# Patient Record
Sex: Male | Born: 1965 | Race: White | Hispanic: No | Marital: Married | Smoking: Former smoker
Health system: Southern US, Community
[De-identification: ages and names within clinical notes are randomized; demographics above are authoritative.]

## PROBLEM LIST (undated history)

## (undated) DIAGNOSIS — I2510 Atherosclerotic heart disease of native coronary artery without angina pectoris: Secondary | ICD-10-CM

## (undated) DIAGNOSIS — C801 Malignant (primary) neoplasm, unspecified: Secondary | ICD-10-CM

## (undated) DIAGNOSIS — E785 Hyperlipidemia, unspecified: Secondary | ICD-10-CM

## (undated) DIAGNOSIS — J449 Chronic obstructive pulmonary disease, unspecified: Secondary | ICD-10-CM

## (undated) DIAGNOSIS — G4730 Sleep apnea, unspecified: Secondary | ICD-10-CM

## (undated) DIAGNOSIS — F17200 Nicotine dependence, unspecified, uncomplicated: Secondary | ICD-10-CM

## (undated) DIAGNOSIS — J189 Pneumonia, unspecified organism: Secondary | ICD-10-CM

## (undated) DIAGNOSIS — L309 Dermatitis, unspecified: Secondary | ICD-10-CM

## (undated) HISTORY — DX: Nicotine dependence, unspecified, uncomplicated: F17.200

## (undated) HISTORY — DX: Pneumonia, unspecified organism: J18.9

## (undated) HISTORY — PX: HERNIA REPAIR: SHX51

---

## 2009-08-06 ENCOUNTER — Ambulatory Visit: Payer: Self-pay | Admitting: Family Medicine

## 2009-08-06 DIAGNOSIS — J209 Acute bronchitis, unspecified: Secondary | ICD-10-CM | POA: Insufficient documentation

## 2009-08-06 DIAGNOSIS — R05 Cough: Secondary | ICD-10-CM | POA: Insufficient documentation

## 2010-05-31 ENCOUNTER — Ambulatory Visit: Payer: Self-pay | Admitting: Family Medicine

## 2010-05-31 DIAGNOSIS — L259 Unspecified contact dermatitis, unspecified cause: Secondary | ICD-10-CM

## 2010-05-31 DIAGNOSIS — L738 Other specified follicular disorders: Secondary | ICD-10-CM

## 2010-06-01 ENCOUNTER — Encounter: Payer: Self-pay | Admitting: Family Medicine

## 2010-06-04 ENCOUNTER — Encounter: Payer: Self-pay | Admitting: Family Medicine

## 2010-07-02 ENCOUNTER — Ambulatory Visit: Payer: Self-pay | Admitting: Family Medicine

## 2010-07-09 ENCOUNTER — Telehealth: Payer: Self-pay | Admitting: Family Medicine

## 2010-07-10 ENCOUNTER — Encounter: Payer: Self-pay | Admitting: Family Medicine

## 2010-07-26 ENCOUNTER — Ambulatory Visit: Payer: Self-pay | Admitting: Family Medicine

## 2010-08-28 NOTE — Assessment & Plan Note (Signed)
Summary: NOV folliculitis   Vital Signs:  Patient profile:   45 year old male Height:      65 inches Weight:      145 pounds BMI:     24.22 O2 Sat:      96 % on Room air Temp:     98.0 degrees F oral Pulse rate:   66 / minute BP sitting:   120 / 80  (left arm) Cuff size:   regular  Vitals Entered By: Payton Spark CMA (July 02, 2010 1:49 PM)  O2 Flow:  Room air CC: New to est. Rash all over body x 6 months (comes and goes)   Primary Care Provider:  Seymour Bars DO  CC:  New to est. Rash all over body x 6 months (comes and goes).  History of Present Illness: 45 yo WM presents for NOV.  He has not had a PCP.  He is previous healthy.  Has not had labs drawn in years.  Has been to UC recently for an itchy rash and was diagnosed with a staph + folliculitis which did respond to doxycycline but the rash came back once it stopped the medication.  He thinks the topical steroid cream did not help.  He is using some oral benadryl for pruritis.  He has been scratching at the rash to the point of bleeding.  His rash is in his beard, the posterior neck, forearms and lower legs.  It is not on his trunk, palms or in his mouth.    Current Medications (verified): 1)  None  Allergies (verified): No Known Drug Allergies  Past History:  Past Medical History: Reviewed history from 08/06/2009 and no changes required. Unremarkable  Past Surgical History: hernia surgery as a baby Vasectomy  Social History: Married to Godwin.  Works as an Personnel officer. Current Smoker - 1 pack daily, 20 yrs Alcohol use-no Drug use-no Regular exercise-yes  Review of Systems       no fevers/sweats/weakness, unexplained wt loss/gain, no change in vision, no difficulty hearing, ringing in ears, no hay fever/allergies, no CP/discomfort, no palpitations, no breast lump/nipple discharge, no cough/wheeze, no blood in stool, no N/V/D, no nocturia, no leaking urine, no unusual vag bleeding, no vaginal/penile  discharge, no muscle/joint pain, + rash, no new/changing mole, no HA, no memory loss, no anxiety, no sleep problem, no depression, no unexplained lumps, no easy bruising/bleeding, no concern with sexual function   Physical Exam  General:  alert, well-developed, well-nourished, and well-hydrated.   Head:  normocephalic, atraumatic, and male-pattern balding.   Eyes:  conjunctiva clear  Nose:  follicular lesion in the R nares Mouth:  no lesions of the oral mucosa Neck:  no masses.   Lungs:  Normal respiratory effort, chest expands symmetrically. Lungs are clear to auscultation, no crackles or wheezes. Heart:  Normal rate and regular rhythm. S1 and S2 normal without gallop, murmur, click, rub or other extra sounds. Msk:  no joint effusions Extremities:  no UE or LE edema Skin:  color normal.  follicular rash over the scalp, occiput, beard distribution, forearms, R calf with excoriations, lichenification and xerosis.  No rash on trunk, thighs or upper arms.   Impression & Recommendations:  Problem # 1:  FOLLICULITIS (ICD-704.8) Follicular rash with pruritis, staph + at UC. Will treat with Minocycline two times a day x 2 wks + benzoyl peroxide wash + anti histamine for itching + topical cetaphil cream.  RTC to recheck in 3 wks. Call if any problems.  Complete Medication List: 1)  Minocycline Hcl 100 Mg Caps (Minocycline hcl) .Marland Kitchen.. 1 capsule by mouth two times a day 2)  Benzoyl Peroxide Wash 5 % Liqd (Benzoyl peroxide) .... Use twice daily after bathing  Other Orders: T-Comprehensive Metabolic Panel [id number]) T-Lipid Profile ([id number])  Patient Instructions: 1)  For folliculitis: 2)  Take Minocycline 1 capsule in the AM and 1 at night each day x 2 wks. 3)  Wash with Dial Soap and water 1-2 x a day. 4)  After soaping up, use Benzoyl peroxide wash. 5)  For itching, Use Claritin in the morning and additional Benadryl at night if needed. 6)  Use a topical cream like Cetaphil after  bathing. 7)  Return for f/u in 3 wks. Prescriptions: BENZOYL PEROXIDE WASH 5 % LIQD (BENZOYL PEROXIDE) use twice daily after bathing  #1 bottle x 1   Entered and Authorized by:   Seymour Bars DO   Signed by:   Seymour Bars DO on 07/02/2010   Method used:   Electronically to        [facility] [phone]* (retail)       [address] [place], [place]  [address]       Ph: [phone]       Fax: [phone]   RxID:   [id number] MINOCYCLINE HCL 100 MG CAPS (MINOCYCLINE HCL) 1 capsule by mouth two times a day  #28 x 0   Entered and Authorized by:   Seymour Bars DO   Signed by:   Seymour Bars DO on 07/02/2010   Method used:   Electronically to        [facility] [phone]* (retail)       [address] [place], [place]  [address]       Ph: [phone]       Fax: [phone]   RxID:   [id number]    Orders Added: 1)  T-Comprehensive Metabolic Panel [[id number]] 2)  T-Lipid Profile [[id number]] 3)  New Patient Level III [93818]

## 2010-08-28 NOTE — Assessment & Plan Note (Signed)
Summary: Followup Call  Followup call to patient:  discussed culture results.  He states that rash now almost completely resolved.  Advised to finish medication. Donna Christen MD  June 04, 2010 8:16 AM

## 2010-08-28 NOTE — Assessment & Plan Note (Signed)
Summary: cough-yellowish, chest congestionh x 1-2 wks rm 3   Vital Signs:  Patient Profile:   45 Years Old Male CC:      Cold & URI symptoms Height:     65 inches Weight:      138 pounds O2 Sat:      91 % O2 treatment:    Room Air Temp:     97.1 degrees F oral Pulse rate:   69 / minute Pulse rhythm:   regular Resp:     14 per minute BP sitting:   127 / 84  (right arm) Cuff size:   regular  Vitals Entered By: Areta Haber CMA (August 06, 2009 4:37 PM)                  Current Allergies: No known allergies History of Present Illness Chief Complaint: Cold & URI symptoms History of Present Illness: Subjective:  Patient complains of URI symptoms for 1.5 weeks, now with persistent  cough and sinus congestion, occasional shortness of breath and wheezing with activity.  His cough is worse at night, and productive of yellow sputum.  He has had chills/sweats recently.  He smokes 1.5 pack per day of cigarettes.  He denies pleuritic pain   Current Problems: ACUTE BRONCHITIS (ICD-466.0) COUGH (ICD-786.2)   Current Meds TYLENOL SINUS SEVERE CONGEST 30-325-200 MG TABS (PSEUDOEPHED-APAP-GUAIFENESIN) As directed MUCINEX DM MAXIMUM STRENGTH 60-1200 MG XR12H-TAB (DEXTROMETHORPHAN-GUAIFENESIN) As directed CLARITHROMYCIN 500 MG TABS (CLARITHROMYCIN) One Tab by mouth two times a day BENZONATATE 200 MG CAPS (BENZONATATE) One by mouth HS as needed cough PREDNISONE 10 MG TABS (PREDNISONE) 2 PO today, then 2 BID for 2 days, then 1 two times a day for 2 days, then 1 daily for 2 days.  Take PC PROAIR HFA 108 (90 BASE) MCG/ACT AERS (ALBUTEROL SULFATE) Two inhalations q4-6hr as needed.  Max 12 puffs/day  REVIEW OF SYSTEMS Constitutional Symptoms      Denies fever, chills, night sweats, weight loss, weight gain, and fatigue.  Eyes       Denies change in vision, eye pain, eye discharge, glasses, contact lenses, and eye surgery. Ear/Nose/Throat/Mouth       Denies hearing loss/aids, change in  hearing, ear pain, ear discharge, dizziness, frequent runny nose, frequent nose bleeds, sinus problems, sore throat, hoarseness, and tooth pain or bleeding.  Respiratory       Complains of productive cough, wheezing, and shortness of breath.      Denies dry cough, asthma, bronchitis, and emphysema/COPD.      Comments: chest congestion Cardiovascular       Denies murmurs, chest pain, and tires easily with exhertion.    Gastrointestinal       Denies stomach pain, nausea/vomiting, diarrhea, constipation, blood in bowel movements, and indigestion. Genitourniary       Denies painful urination, kidney stones, and loss of urinary control. Neurological       Denies paralysis, seizures, and fainting/blackouts. Musculoskeletal       Denies muscle pain, joint pain, joint stiffness, decreased range of motion, redness, swelling, muscle weakness, and gout.  Skin       Denies bruising, unusual mles/lumps or sores, and hair/skin or nail changes.  Psych       Denies mood changes, temper/anger issues, anxiety/stress, speech problems, depression, and sleep problems. Other Comments: yellowish x 1-2 weeks .   Past History:  Past Medical History: Unremarkable  Past Surgical History: Denies surgical history  Family History: Adopted  Social History: Married Current  Smoker - 1 pack daily Alcohol use-no Drug use-no Regular exercise-yes Smoking Status:  current Drug Use:  no Does Patient Exercise:  yes   Objective:  No acute distress  Eyes:  Pupils are equal, round, and reactive to light and accomdation.  Extraocular movement is intact.  Conjunctivae are not inflamed.  Ears:  Canals normal.  Tympanic membranes normal.   Nose:  Normal septum.  Normal turbinates, mildly congested.   No sinus tenderness present.  Pharynx:  Normal  Neck:  Supple.  No adenopathy is present.   Lungs:   Bilateral rhonchi, faint rales on right.  Breath sounds are equal.  Heart:  Regular rate and rhythm without  murmurs, rubs, or gallops.  Abdomen:  Nontender without masses or hepatosplenomegaly.  Bowel sounds are present.  No CVA or flank tenderness.  Extremities:  No edema.   Chest X-ray:  negative Assessment New Problems: ACUTE BRONCHITIS (ICD-466.0) COUGH (ICD-786.2)   Plan New Medications/Changes: PROAIR HFA 108 (90 BASE) MCG/ACT AERS (ALBUTEROL SULFATE) Two inhalations q4-6hr as needed.  Max 12 puffs/day  #1 MDI x 0, 08/06/2009, Donna Christen MD PREDNISONE 10 MG TABS (PREDNISONE) 2 PO today, then 2 BID for 2 days, then 1 two times a day for 2 days, then 1 daily for 2 days.  Take PC  #16 x 0, 08/06/2009, Donna Christen MD BENZONATATE 200 MG CAPS (BENZONATATE) One by mouth HS as needed cough  #12 x 0, 08/06/2009, Donna Christen MD CLARITHROMYCIN 500 MG TABS (CLARITHROMYCIN) One Tab by mouth two times a day  #20 x 0, 08/06/2009, Donna Christen MD  New Orders: T-Chest x-ray, 2 views [71020] Albuterol Sulfate Sol 1mg  unit dose [Z6109] Ipratropium inhalation sol. unit dose [U0454] Nebulizer Tx [94640] New Patient Level III [99203] Planning Comments:   Begin Biaxin and tapering course of prednisone.  Expectorant and increased fluids.  Cough suppressant at night.  Albuterol inhaler Albuterol neg treatment in office. Recommend discontinuing smoking. Follow-up with PCP if not improved.   The patient and/or caregiver has been counseled thoroughly with regard to medications prescribed including dosage, schedule, interactions, rationale for use, and possible side effects and they verbalize understanding.  Diagnoses and expected course of recovery discussed and will return if not improved as expected or if the condition worsens. Patient and/or caregiver verbalized understanding.  Prescriptions: PROAIR HFA 108 (90 BASE) MCG/ACT AERS (ALBUTEROL SULFATE) Two inhalations q4-6hr as needed.  Max 12 puffs/day  #1 MDI x 0   Entered and Authorized by:   Donna Christen MD   Signed by:   Donna Christen MD on  08/06/2009   Method used:   Print then Give to Patient   RxID:   [id number] PREDNISONE 10 MG TABS (PREDNISONE) 2 PO today, then 2 BID for 2 days, then 1 two times a day for 2 days, then 1 daily for 2 days.  Take PC  #16 x 0   Entered and Authorized by:   Donna Christen MD   Signed by:   Donna Christen MD on 08/06/2009   Method used:   Print then Give to Patient   RxID:   [id number] BENZONATATE 200 MG CAPS (BENZONATATE) One by mouth HS as needed cough  #12 x 0   Entered and Authorized by:   Donna Christen MD   Signed by:   Donna Christen MD on 08/06/2009   Method used:   Print then Give to Patient   RxID:   [id number] CLARITHROMYCIN 500 MG TABS (CLARITHROMYCIN) One Tab by mouth  two times a day  #20 x 0   Entered and Authorized by:   Donna Christen MD   Signed by:   Donna Christen MD on 08/06/2009   Method used:   Print then Give to Patient   RxID:   [id number]   Patient Instructions: 1)  May use Mucinex D (guaifenesin with decongestant) twice daily for congestion. 2)  Increase fluid intake, rest. 3)  May use Afrin nasal spray (or generic oxymetazoline) twice daily for about 5 days.  Also recommend using saline nasal spray several times daily and/or saline nasal irrigation. 4)  Followup with family doctor if not improving one week.    Medication Administration  Medication # 1:    Medication: Albuterol Sulfate Sol 1mg  unit dose    Diagnosis: COUGH (ICD-786.2)    Route: inhaled    Exp Date: 06/27/2010    Lot #: [id number]    Mfr: Mylan    Given by: Areta Haber CMA (August 06, 2009 5:19 PM)  Medication # 2:    Medication: Ipratropium inhalation sol. unit dose    Diagnosis: COUGH (ICD-786.2)    Route: inhaled    Exp Date: 06/27/2010    Lot #: [id number]    Mfr: Mylan    Patient tolerated medication without complications    Given by: Areta Haber CMA (August 06, 2009 5:20 PM)  Orders Added: 1)  T-Chest x-ray, 2 views [71020] 2)  Albuterol Sulfate Sol 1mg   unit dose [J7613] 3)  Ipratropium inhalation sol. unit dose [J7644] 4)  Nebulizer Tx [94640] 5)  New Patient Level III [14782]

## 2010-08-28 NOTE — Assessment & Plan Note (Signed)
Summary: RASH ON BACK OF NECK   Vital Signs:  Patient Profile:   45 Years Old Male CC:      Rash  base of skull x 3 weeks, itches Height:     65 inches Weight:      140 pounds O2 Sat:      94 % O2 treatment:    Room Air Temp:     98.7 degrees F oral Pulse rate:   90 / minute Pulse rhythm:   regular Resp:     16 per minute BP sitting:   134 / 88  (left arm) Cuff size:   regular  Vitals Entered By: Emilio Math (May 31, 2010 11:33 AM)                  Current Allergies (reviewed today): No known allergies History of Present Illness Chief Complaint: Rash  base of skull x 3 weeks, itches History of Present Illness:  Subjective:  Patient complains of recurring pruritic rash on hands and lower extremities that occurs several times per year and resolves spontaneously.  About 3 to 4 weeks he developed similar typical rash that generally resolved on his trunk and extremities, but now he has persistent crusted pruritic lesions in the back of his scalp and upper neck that intermittently drain.  He feels well otherwise.  No fevers, chills, and sweats   REVIEW OF SYSTEMS Constitutional Symptoms      Denies fever, chills, night sweats, weight loss, weight gain, and fatigue.  Eyes       Denies change in vision, eye pain, eye discharge, glasses, contact lenses, and eye surgery. Ear/Nose/Throat/Mouth       Denies hearing loss/aids, change in hearing, ear pain, ear discharge, dizziness, frequent runny nose, frequent nose bleeds, sinus problems, sore throat, hoarseness, and tooth pain or bleeding.  Respiratory       Denies dry cough, productive cough, wheezing, shortness of breath, asthma, bronchitis, and emphysema/COPD.  Cardiovascular       Denies murmurs, chest pain, and tires easily with exhertion.    Gastrointestinal       Denies stomach pain, nausea/vomiting, diarrhea, constipation, blood in bowel movements, and indigestion. Genitourniary       Denies painful urination,  kidney stones, and loss of urinary control. Neurological       Denies paralysis, seizures, and fainting/blackouts. Musculoskeletal       Denies muscle pain, joint pain, joint stiffness, decreased range of motion, redness, swelling, muscle weakness, and gout.  Skin       Denies bruising, unusual mles/lumps or sores, and hair/skin or nail changes.  Psych       Denies mood changes, temper/anger issues, anxiety/stress, speech problems, depression, and sleep problems.  Past History:  Past Medical History: Reviewed history from 08/06/2009 and no changes required. Unremarkable  Past Surgical History:  Vasectomy  Social History: Married Current Smoker - 1 pack daily, 20 yrs Alcohol use-no Drug use-no Regular exercise-yes   Objective:  Appearance:  Patient appears healthy, stated age, and in no acute distress  Skin:  No active lesions on trunk or extremities.  In the occipital scalp are numerous crusted excoriations measuring 5 to 8mm dia, slightly moist with an exudate.  No swelling or tenderness to palpation.  No nits or lice observed. Neck:  several shotty tender occipital nodes palpated Assessment New Problems: ECZEMA (ICD-692.9) FOLLICULITIS (ICD-704.8)  SUSPECT UNDERLYING ECZEMATOUS DERMATITIS, WITH A SCALP FOLLICULITS.  ? TINEA CAPITIS  Plan New Medications/Changes: TRIAMCINOLONE  ACETONIDE 0.1 % CREA (TRIAMCINOLONE ACETONIDE) Apply thin layer to affected area bid  #30gm x 0, 05/31/2010, Donna Christen MD DOXYCYCLINE HYCLATE 100 MG CAPS (DOXYCYCLINE HYCLATE) One by mouth two times a day  #20 x 0, 05/31/2010, Donna Christen MD  New Orders: T-Culture, Wound [[id number]] T- * Misc. Laboratory test [99999] Est. Patient Level III [99213] Planning Comments:   Obtained specimens from scalp lesions for bacterial and fungal cultures. Begin doxycycline 100mg  two times a day for 7 to 10 days.  Apply triamcinolone 0.1% cream two times a day to scalp lesions  If lesions do not  clear within 2 weeks (and fungal culture negative) recommend dermatology referral. Recommend applying the triamcinolone cream also on recurrent skin lesions trunk and extremities.   The patient and/or caregiver has been counseled thoroughly with regard to medications prescribed including dosage, schedule, interactions, rationale for use, and possible side effects and they verbalize understanding.  Diagnoses and expected course of recovery discussed and will return if not improved as expected or if the condition worsens. Patient and/or caregiver verbalized understanding.  Prescriptions: TRIAMCINOLONE ACETONIDE 0.1 % CREA (TRIAMCINOLONE ACETONIDE) Apply thin layer to affected area bid  #30gm x 0   Entered and Authorized by:   Donna Christen MD   Signed by:   Donna Christen MD on 05/31/2010   Method used:   Print then Give to Patient   RxID:   [id number] DOXYCYCLINE HYCLATE 100 MG CAPS (DOXYCYCLINE HYCLATE) One by mouth two times a day  #20 x 0   Entered and Authorized by:   Donna Christen MD   Signed by:   Donna Christen MD on 05/31/2010   Method used:   Print then Give to Patient   RxID:   [id number]   Orders Added: 1)  T-Culture, Wound [[id number]] 2)  T- * Misc. Laboratory test [99999] 3)  Est. Patient Level III [66063]

## 2010-08-30 NOTE — Progress Notes (Signed)
Summary: folliculitis  Phone Note Call from Patient Call back at [phone]   Caller: Spouse-Tracy Call For: Brandon Bars DO Summary of Call: Wife calls and wanted to know if you could refer him to a dermatologist- states his folliculitis is getting worse Initial call taken by: Kathlene November LPN,  July 09, 2010 9:28 AM  Follow-up for Phone Call        sure.  thanks for letting me know. Follow-up by: Brandon Bars DO,  July 09, 2010 9:37 AM

## 2010-08-30 NOTE — Consult Note (Signed)
Summary: [facility]   Imported By: Lanelle Bal 08/23/2010 11:05:23  _____________________________________________________________________  External Attachment:    Type:   Image     Comment:   External Document

## 2014-04-29 ENCOUNTER — Emergency Department
Admission: EM | Admit: 2014-04-29 | Discharge: 2014-04-29 | Disposition: A | Discharge status: Home / Self Care | Payer: Commercial Managed Care - PPO | Source: Home / Self Care | Attending: Emergency Medicine | Admitting: Emergency Medicine

## 2014-04-29 ENCOUNTER — Encounter: Payer: Self-pay | Admitting: Emergency Medicine

## 2014-04-29 DIAGNOSIS — R591 Generalized enlarged lymph nodes: Secondary | ICD-10-CM

## 2014-04-29 HISTORY — DX: Dermatitis, unspecified: L30.9

## 2014-04-29 MED ORDER — CIPROFLOXACIN HCL 500 MG PO TABS: 500.0000 mg | ORAL_TABLET | Freq: Two times a day (BID) | ORAL | Status: DC

## 2014-04-29 NOTE — ED Provider Notes (Signed)
CSN: [id number]     Arrival date & time 04/29/14  1044 History   First MD Initiated Contact with Patient 04/29/14 1121     Chief Complaint  Patient presents with  . Lymphadenopathy   (Consider location/radiation/quality/duration/timing/severity/associated sxs/prior Treatment) HPI Comments: Pt complains of swollen glands under both arms.  (Pt was scratched by his cat 3 weeks ago)  Pt reports some swollen glands to neck.  No fever, no chills,  Pt denies any cuts.  Pt reports no other symptoms.  Pt denies any other symptoms.    The history is provided by the patient. No language interpreter was used.    No past medical history on file. No past surgical history on file. No family history on file. History  Substance Use Topics  . Smoking status: Not on file  . Smokeless tobacco: Not on file  . Alcohol Use: Not on file    Review of Systems  All other systems reviewed and are negative.   Allergies  Review of patient's allergies indicates no known allergies.  Home Medications   Prior to Admission medications   Not on File   BP 138/88  Pulse 59  Temp(Src) 97.9 F (36.6 C)  Ht 5' 5.25" (1.657 m)  Wt 128 lb 4 oz (58.174 kg)  BMI 21.19 kg/m2  SpO2 98% Physical Exam  Nursing note and vitals reviewed. Constitutional: He is oriented to person, place, and time. He appears well-developed and well-nourished.  HENT:  Head: Normocephalic.  Eyes: EOM are normal.  Neck: Normal range of motion.  Swollen glans neck    Cardiovascular: Normal rate and normal heart sounds.   Pulmonary/Chest: Effort normal.  Abdominal: He exhibits no distension.  Musculoskeletal:  Multiple swollen nodules under bilat arms  Neurological: He is alert and oriented to person, place, and time.  Psychiatric: He has a normal mood and affect.    ED Course  Procedures (including critical care time) Labs Review Labs Reviewed - No data to display  Imaging Review No results found.   MDM   1.  Lymphadenopathy    I reviewed current literature on cat scratch fever,  Self limiting,  Treatment only if painful lymph nodes.  Cipro reccommended    Pt advised recheck in 1 week.  Pt given primary care referrals.    [place], [name] 04/29/14 1256

## 2014-04-29 NOTE — ED Notes (Addendum)
Swollen lymph nodes in both axilla.  L started first and now R.  Began 5 days ago. Tender but not painful.Has a cat and was scratched 3 wks ago.

## 2014-04-29 NOTE — Discharge Instructions (Signed)
Swollen Lymph Nodes The lymphatic system filters fluid from around cells. It is like a system of blood vessels. These channels carry lymph instead of blood. The lymphatic system is an important part of the immune (disease fighting) system. When people talk about "swollen glands in the neck," they are usually talking about swollen lymph nodes. The lymph nodes are like the little traps for infection. You and your caregiver may be able to feel lymph nodes, especially swollen nodes, in these common areas: the groin (inguinal area), armpits (axilla), and above the clavicle (supraclavicular). You may also feel them in the neck (cervical) and the back of the head just above the hairline (occipital). Swollen glands occur when there is any condition in which the body responds with an allergic type of reaction. For instance, the glands in the neck can become swollen from insect bites or any type of minor infection on the head. These are very noticeable in children with only minor problems. Lymph nodes may also become swollen when there is a tumor or problem with the lymphatic system, such as Hodgkin's disease. TREATMENT   Most swollen glands do not require treatment. They can be observed (watched) for a short period of time, if your caregiver feels it is necessary. Most of the time, observation is not necessary.  Antibiotics (medicines that kill germs) may be prescribed by your caregiver. Your caregiver may prescribe these if he or she feels the swollen glands are due to a bacterial (germ) infection. Antibiotics are not used if the swollen glands are caused by a virus. HOME CARE INSTRUCTIONS   Take medications as directed by your caregiver. Only take over-the-counter or prescription medicines for pain, discomfort, or fever as directed by your caregiver. SEEK MEDICAL CARE IF:   If you begin to run a temperature greater than 102 F (38.9 C), or as your caregiver suggests. MAKE SURE YOU:   Understand these  instructions.  Will watch your condition.  Will get help right away if you are not doing well or get worse. Document Released: 07/05/2002 Document Revised: 10/07/2011 Document Reviewed: 07/15/2005 [facility] Patient Information 2015 [place], [place]. This information is not intended to replace advice given to you by your health care provider. Make sure you discuss any questions you have with your health care provider. Cat Scratch Disease Cats often injure people by scratching or biting. This site of injury can become infected with a particular germ or bacteria present in the mouth of or on the cat. This germ is called Bartonella henselae. This infection is identified by the common name cat scratch disease (CSD).  SYMPTOMS  A red and sore pimple or bump, with or without pus, on the skin where the cat scratched or bit. The pimple or sore may be present for as long as three weeks after the scratch or bite occurred.  One or more enlarged lymph glands located toward the center of the body from where the injury occurred.  Less common symptoms include low-grade fever, tiredness, fatigue, headache and/or sore throat. DIAGNOSIS  The diagnosis is typically made by your caregiver who notes the history of a scratch or bite from a cat, and finds the skin sore and swollen lymph glands in the described area.  Culture of any drainage or pus from the injury site, or a needle aspiration or piece of tissue (biopsy) from a swollen lymph gland may also be done to confirm the diagnosis and assure that a different infection or disease is not causing your illness. Rare  but serious complications may occur, they include:  Parinaud's syndrome - fever, swollen lymph glands and inflammation of the eye (conjunctivitis).  Infection of the brain (encephalitis).  Infection of the nerve of the eye (neuroretinitis).  Infection of the bone (osteomyelitis). TREATMENT  Usually treatment is not necessary or helpful,  especially if you have a normal immune system. When infection is very severe, it may be treated with a medicine that kills the bacteria (antibiotic).  People with immune system problems (such as having AIDS or an organ transplant, or being on steroids or other immune modifying drugs) should be treated with antibiotics. HOME CARE INSTRUCTIONS   Avoid injury while playing with cats.  Wash well after playing with cats.  Do not let your cat lick sores on your body.  Do not let your cat roam around outside of your house.  Keep the area of the cat scratch clean. Wash it with soap and water or apply an antiseptic solution such as povidone iodine.  You should get a tetanus shot if you have not had one in the past 5 or 10 years. If you receive one, your arm may get swollen and red and warm to the touch at the shot site. This is a common response to the medication in the shot. If you did not receive a tetanus shot here because you did not recall when your last one was given, make sure to check with your caregiver's office and determine if one is needed. Generally, for a "dirty" wound, you should receive a tetanus booster if you have not had one in the last five years. If you have a "clean" wound, you should receive a tetanus booster if you have not had one in the last ten years. SEEK IMMEDIATE MEDICAL CARE IF:   You have worsening signs of infection, such as more redness, increased pain, red streaking or pus coming from the wound, or warmth or swelling around the area of the scratch.  You develop worsening swollen lymph glands.  You develop abdominal pain, have problems with your vision or develop a skin rash.  You have a fever.  You become more tired or dizzy, or have a worsening headache.  You develop inflammation of your eye or have increasing vision problems.  You have pain in one of your bones.  You develop a stiff neck.  You pass out. MAKE SURE YOU:   Understand these  instructions.  Will watch your condition.  Will get help right away if you are not doing well or get worse. Document Released: 07/12/2000 Document Revised: 10/07/2011 Document Reviewed: 08/24/2008 [facility] Patient Information 2015 [place], [place]. This information is not intended to replace advice given to you by your health care provider. Make sure you discuss any questions you have with your health care provider.

## 2014-04-30 NOTE — ED Provider Notes (Signed)
Medical history/examination/treatment/procedure(s) were performed by non-physician provider and as supervising physician I was immediately available for consultation/collaboration.  Jacqulyn Cane, MD 04/30/14 2055

## 2014-05-02 ENCOUNTER — Telehealth: Payer: Self-pay | Admitting: *Deleted

## 2016-10-10 ENCOUNTER — Emergency Department
Admission: EM | Admit: 2016-10-10 | Discharge: 2016-10-10 | Disposition: A | Discharge status: Home / Self Care | Payer: Commercial Managed Care - PPO | Source: Home / Self Care | Attending: Emergency Medicine | Admitting: Emergency Medicine

## 2016-10-10 ENCOUNTER — Emergency Department (INDEPENDENT_AMBULATORY_CARE_PROVIDER_SITE_OTHER): Payer: Commercial Managed Care - PPO

## 2016-10-10 ENCOUNTER — Encounter: Payer: Self-pay | Admitting: Emergency Medicine

## 2016-10-10 DIAGNOSIS — R0989 Other specified symptoms and signs involving the circulatory and respiratory systems: Secondary | ICD-10-CM

## 2016-10-10 DIAGNOSIS — J189 Pneumonia, unspecified organism: Secondary | ICD-10-CM | POA: Diagnosis not present

## 2016-10-10 DIAGNOSIS — R05 Cough: Secondary | ICD-10-CM | POA: Diagnosis not present

## 2016-10-10 DIAGNOSIS — R058 Other specified cough: Secondary | ICD-10-CM

## 2016-10-10 DIAGNOSIS — F17200 Nicotine dependence, unspecified, uncomplicated: Secondary | ICD-10-CM

## 2016-10-10 MED ORDER — CLARITHROMYCIN 500 MG PO TABS: ORAL_TABLET | ORAL | 0 refills | Status: DC

## 2016-10-10 MED ORDER — PREDNISONE 50 MG PO TABS: ORAL_TABLET | ORAL | 0 refills | Status: DC

## 2016-10-10 NOTE — Discharge Instructions (Signed)
Make appointment and follow-up with primary care doctor in 10 days

## 2016-10-10 NOTE — ED Provider Notes (Signed)
[facility]    CSN: [id number] Arrival date & time: 10/10/16  1402     History   Chief Complaint Chief Complaint  Patient presents with  . Nasal Congestion    HPI Brandon Phillips is a 51 y.o. male.   The history is provided by the patient. No language interpreter was used.   About 3 months ago started with febrile illness, cough productive of yellow sputum, that persisted for a month or more. Fever resolved, and then cough with chest congestion has persisted for the past month or 2. Currently, cough can be productive of whitish, occ yellow sputum. No blood. No fever or chills currently. No nausea or vomiting or chest pain. 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 He's smoked a pack and a half a day for 35 years, and not willing to quit at this time.-I did advise that he quit smoking today, and explain risks of not doing so. He states he's never had a diagnosis of chronic lung disease or asthma, but that he gets "bronchitis from time to time". 2 months ago, when the chest congestion and cough was severe, he had some mild shortness of breath, but currently denies any shortness of breath. Denies exertional chest pain or dyspnea currently. Past Medical History:  Diagnosis Date  . Eczema     Patient Active Problem List   Diagnosis Date Noted  . ECZEMA 05/31/2010  . FOLLICULITIS 10/93/2355  . ACUTE BRONCHITIS 08/06/2009  . COUGH 08/06/2009    Past Surgical History:  Procedure Laterality Date  . HERNIA REPAIR     as an infant       Home Medications    Prior to Admission medications   Medication Sig Start Date End Date Taking? Authorizing Provider  clarithromycin (BIAXIN) 500 MG tablet Take 1 twice a day for 10 days. 10/10/16   Jacqulyn Cane, MD  predniSONE (DELTASONE) 50 MG tablet Take 1 daily with a meal for 6 days 10/10/16   Jacqulyn Cane, MD    Family History No  family history on file.  Social History Social History  Substance Use Topics  . Smoking status: Heavy Tobacco Smoker    Packs/day: 1.50    Years: 35.00    Types: Cigarettes  . Smokeless tobacco: Never Used  . Alcohol use No     Allergies   Patient has no known allergies.   Review of Systems Review of Systems  Constitutional: Negative for fever and unexpected weight change.  HENT: Positive for congestion and rhinorrhea. Negative for sore throat and voice change.   Eyes: Negative for discharge.  Respiratory: Positive for wheezing (Occasionally). Negative for shortness of breath.   Cardiovascular: Negative for chest pain, palpitations and leg swelling.  Gastrointestinal: Negative for abdominal distention, abdominal pain, diarrhea and nausea.  Genitourinary: Negative for difficulty urinating.  Skin: Negative for color change and rash.  Neurological: Negative for dizziness, seizures and syncope.  All other systems reviewed and are negative.    Physical Exam Triage Vital Signs ED Triage Vitals  Enc Vitals Group     BP 10/10/16 1420 134/87     Pulse Rate 10/10/16 1420 73     Resp --      Temp 10/10/16 1420 98.2 F (36.8 C)     Temp Source 10/10/16 1420 Oral     SpO2 10/10/16 1420 95 %     Weight 10/10/16  1420 138 lb (62.6 kg)     Height 10/10/16 1420 '5\' 5"'$  (1.651 m)     Head Circumference --      Peak Flow --      Pain Score 10/10/16 1422 0     Pain Loc --      Pain Edu? --      Excl. in [place]? --    No data found.   Updated Vital Signs BP 134/87 (BP Location: Left Arm)   Pulse 73   Temp 98.2 F (36.8 C) (Oral)   Ht '5\' 5"'$  (1.651 m)   Wt 138 lb (62.6 kg)   SpO2 95%   BMI 22.96 kg/m    Physical Exam  Constitutional: He is oriented to person, place, and time. He appears well-developed and well-nourished. No distress.  HENT:  Head: Normocephalic and atraumatic.  Right Ear: Tympanic membrane normal.  Left Ear: Tympanic membrane normal.  Nose: Nose normal.    Mouth/Throat: Oropharynx is clear and moist. No oropharyngeal exudate.  Eyes: Right eye exhibits no discharge. Left eye exhibits no discharge. No scleral icterus.  Neck: Neck supple.  Cardiovascular: Normal rate, regular rhythm and normal heart sounds.   Pulmonary/Chest: No tachypnea. No respiratory distress. He has no decreased breath sounds. He has wheezes (mild, late exp bilat). He has rhonchi. He has no rales.  Lymphadenopathy:    He has no cervical adenopathy.  Neurological: He is alert and oriented to person, place, and time.  Skin: Skin is warm and dry.  Nursing note and vitals reviewed.  oxygen saturation 95% on room air   UC Treatments / Results  Labs (all labs ordered are listed, but only abnormal results are displayed) Labs Reviewed - No data to display  EKG  EKG Interpretation None       Radiology Dg Chest 2 View  Result Date: 10/10/2016 CLINICAL DATA:  Productive cough for several months EXAM: CHEST  2 VIEW COMPARISON:  08/06/2009 FINDINGS: Cardiac shadow is within normal limits. The lungs are well aerated bilaterally. Minimal infiltrate is noted projecting in the lingula on the left. No sizable effusion is seen. No bony abnormality is noted. IMPRESSION: Minimal lingular infiltrate. Electronically Signed   By: Inez Catalina M.D.   On: 10/10/2016 14:51    Procedures Procedures (including critical care time)  Medications Ordered in UC Medications - No data to display   Initial Impression / Assessment and Plan / UC Course  I have reviewed the triage vital signs and the nursing notes.  Pertinent labs & imaging results that were available during my care of the patient were reviewed by me and considered in my medical decision making (see chart for details).    CXR- reviewed w pt Result Date: 10/10/2016 EXAM: CHEST  2 VIEW COMPARISON:  08/06/2009 FINDINGS: Cardiac shadow is within normal limits. The lungs are well aerated bilaterally. Minimal infiltrate is noted  projecting in the lingula on the left. No sizable effusion is seen. No bony abnormality is noted. IMPRESSION: Minimal lingular infiltrate. Electronically Signed   By: Inez Catalina M.D.   On: 10/10/2016 14:51   Based on hx, physical, cxr, likely has subacute L lingular infiltrate/pneumonia.  VSS, he can be tx'd as an outpt with close f/u.- Discussed at length w pt, including need for close f/u and risks of not doing so.  Advised to quit smoking and I explained the risks of not doing so.   Final Clinical Impressions(s) / UC Diagnoses   Final diagnoses:  Chest congestion  Productive cough  Smoker  Lingular pneumonia   Treatment options discussed, as well as risks, benefits, alternatives. Patient voiced understanding and agreement with the following plans:  New Prescriptions Discharge Medication List as of 10/10/2016  3:20 PM    START taking these medications   Details  clarithromycin (BIAXIN) 500 MG tablet Take 1 twice a day for 10 days., Normal    predniSONE (DELTASONE) 50 MG tablet Take 1 daily with a meal for 6 days, Normal      He dclined any neb or IM tx today. He declined rx for albuterol hand-help spray. Follow-up with your primary care doctor in 5-7 days. Precautions discussed. Red flags discussed. Questions invited and answered. Patient voiced understanding and agreement.    Jacqulyn Cane, MD 10/11/16 2010

## 2016-10-10 NOTE — ED Triage Notes (Signed)
Congestion, cough, hoarseness x 3 months Has been working in [place]

## 2017-01-07 ENCOUNTER — Encounter: Payer: Self-pay | Admitting: *Deleted

## 2017-01-07 ENCOUNTER — Emergency Department
Admission: EM | Admit: 2017-01-07 | Discharge: 2017-01-07 | Disposition: A | Discharge status: Home / Self Care | Payer: Commercial Managed Care - PPO | Source: Home / Self Care | Attending: Family Medicine | Admitting: Family Medicine

## 2017-01-07 DIAGNOSIS — J209 Acute bronchitis, unspecified: Secondary | ICD-10-CM

## 2017-01-07 MED ORDER — BENZONATATE 100 MG PO CAPS: 100.0000 mg | ORAL_CAPSULE | Freq: Three times a day (TID) | ORAL | 0 refills | Status: DC

## 2017-01-07 MED ORDER — ALBUTEROL SULFATE HFA 108 (90 BASE) MCG/ACT IN AERS: 1.0000 | INHALATION_SPRAY | Freq: Four times a day (QID) | RESPIRATORY_TRACT | 0 refills | Status: DC | PRN

## 2017-01-07 MED ORDER — DOXYCYCLINE HYCLATE 100 MG PO CAPS: 100.0000 mg | ORAL_CAPSULE | Freq: Two times a day (BID) | ORAL | 0 refills | Status: DC

## 2017-01-07 MED ORDER — PREDNISONE 20 MG PO TABS: ORAL_TABLET | ORAL | 0 refills | Status: DC

## 2017-01-07 NOTE — ED Triage Notes (Signed)
Patient c/o 2 weeks of chest congestion, cough, nasal congestion and SOB. Afebrile. Taken Mucinex otc. Seen @ end of March for same issue.

## 2017-01-07 NOTE — ED Provider Notes (Signed)
CSN: [id number]     Arrival date & time 01/07/17  1159 History   First MD Initiated Contact with Patient 01/07/17 1218     Chief Complaint  Patient presents with  . Nasal Congestion  . Cough   (Consider location/radiation/quality/duration/timing/severity/associated sxs/prior Treatment) HPI Zayden Maffei is a 51 y.o. male presenting to UC with c/o 2 weeks of productive cough with nasal and chest congestion, mild SOB.  Denies fever, n/v/d. Pt notes he does travel a lot for work.  He is a daily cigarette smoker.  He does not intend to quit at this time but he has cut back this past week.  He was seen at [facility] in March for similar symptoms, dx with pneumonia. He completed a course of clarithromycin but states cough never completely resolved.  Denies sick contacts. No diagnosed hx of asthma or COPD. He is willing to try prednisone and an albuterol inhaler today.     Past Medical History:  Diagnosis Date  . Eczema    Past Surgical History:  Procedure Laterality Date  . HERNIA REPAIR     as an infant   History reviewed. No pertinent family history. Social History  Substance Use Topics  . Smoking status: Heavy Tobacco Smoker    Packs/day: 1.50    Years: 35.00    Types: Cigarettes  . Smokeless tobacco: Never Used  . Alcohol use No    Review of Systems  Constitutional: Negative for chills and fever.  HENT: Positive for congestion and rhinorrhea. Negative for ear pain, sore throat, trouble swallowing and voice change.   Respiratory: Positive for cough, chest tightness, shortness of breath and wheezing.   Cardiovascular: Negative for chest pain and palpitations.  Gastrointestinal: Negative for abdominal pain, diarrhea, nausea and vomiting.  Musculoskeletal: Negative for arthralgias, back pain and myalgias.  Skin: Negative for rash.    Allergies  Patient has no known allergies.  Home Medications   Prior to Admission medications   Medication Sig Start Date End Date Taking?  Authorizing Provider  albuterol (PROVENTIL HFA;VENTOLIN HFA) 108 (90 Base) MCG/ACT inhaler Inhale 1-2 puffs into the lungs every 6 (six) hours as needed for wheezing or shortness of breath. 01/07/17   Noland Fordyce, [name]  benzonatate (TESSALON) 100 MG capsule Take 1-2 capsules (100-200 mg total) by mouth every 8 (eight) hours. 01/07/17   Noland Fordyce, [name]  doxycycline (VIBRAMYCIN) 100 MG capsule Take 1 capsule (100 mg total) by mouth 2 (two) times daily. One po bid x 10 days 01/07/17   Noland Fordyce, [name]  predniSONE (DELTASONE) 20 MG tablet 3 tabs po day one, then 2 po daily x 4 days 01/07/17   Noland Fordyce, [name]   Meds Ordered and Administered this Visit  Medications - No data to display  BP 136/89 (BP Location: Left Arm)   Pulse 60   Temp 99.1 F (37.3 C) (Oral)   Resp 14   Wt 134 lb (60.8 kg)   SpO2 93%   BMI 22.30 kg/m  No data found.   Physical Exam  Constitutional: He is oriented to person, place, and time. He appears well-developed and well-nourished. No distress.  HENT:  Head: Normocephalic and atraumatic.  Right Ear: Tympanic membrane normal.  Left Ear: Tympanic membrane normal.  Nose: Nose normal.  Mouth/Throat: Uvula is midline, oropharynx is clear and moist and mucous membranes are normal.  Eyes: EOM are normal.  Neck: Normal range of motion.  Cardiovascular: Normal rate and regular rhythm.   Pulmonary/Chest: Effort normal. He  has wheezes.  Diffuse faint rhonchi, expiratory wheeze throughout. No respiratory distress. Occasional cough during exam.   Musculoskeletal: Normal range of motion.  Neurological: He is alert and oriented to person, place, and time.  Skin: Skin is warm and dry. He is not diaphoretic.  Psychiatric: He has a normal mood and affect. His behavior is normal.  Nursing note and vitals reviewed.   Urgent Care Course     Procedures (including critical care time)  Labs Review Labs Reviewed - No data to display  Imaging Review No results  found.    MDM   1. Acute bronchitis, unspecified organism    Pt c/o 2 weeks of productive cough. Hx of pneumonia 3 months ago.  Will cover for bacterial cause. Will try Doxycyline this time as pt was on clarithromycin last visit. States symptoms never fully resolved.  No respiratory distress.  Rx: Doxycycline, prednisone, albuterol inhaler, and tessalon   Home care instructions provided. F/u with PCP as needed.    Noland Fordyce, [name] 01/07/17 1232

## 2017-01-24 ENCOUNTER — Ambulatory Visit (INDEPENDENT_AMBULATORY_CARE_PROVIDER_SITE_OTHER): Payer: Commercial Managed Care - PPO

## 2017-01-24 ENCOUNTER — Encounter: Payer: Self-pay | Admitting: Physician Assistant

## 2017-01-24 ENCOUNTER — Ambulatory Visit (INDEPENDENT_AMBULATORY_CARE_PROVIDER_SITE_OTHER): Payer: Commercial Managed Care - PPO | Admitting: Physician Assistant

## 2017-01-24 VITALS — BP 110/69 | HR 79 | Temp 98.0°F | Resp 18 | Wt 136.5 lb

## 2017-01-24 DIAGNOSIS — J449 Chronic obstructive pulmonary disease, unspecified: Secondary | ICD-10-CM

## 2017-01-24 DIAGNOSIS — R05 Cough: Secondary | ICD-10-CM

## 2017-01-24 DIAGNOSIS — F17200 Nicotine dependence, unspecified, uncomplicated: Secondary | ICD-10-CM | POA: Diagnosis not present

## 2017-01-24 DIAGNOSIS — R058 Other specified cough: Secondary | ICD-10-CM

## 2017-01-24 DIAGNOSIS — R0609 Other forms of dyspnea: Secondary | ICD-10-CM | POA: Insufficient documentation

## 2017-01-24 DIAGNOSIS — Z7689 Persons encountering health services in other specified circumstances: Secondary | ICD-10-CM | POA: Diagnosis not present

## 2017-01-24 HISTORY — DX: Nicotine dependence, unspecified, uncomplicated: F17.200

## 2017-01-24 LAB — CBC WITH DIFFERENTIAL/PLATELET
BASOS ABS: 0 {cells}/uL (ref 0–200)
Basophils Relative: 0 %
EOS PCT: 1 %
Eosinophils Absolute: 111 cells/uL (ref 15–500)
HCT: 51.2 % — ABNORMAL HIGH (ref 38.5–50.0)
HEMOGLOBIN: 17.5 g/dL — AB (ref 13.2–17.1)
LYMPHS ABS: 3441 {cells}/uL (ref 850–3900)
Lymphocytes Relative: 31 %
MCH: 29.4 pg (ref 27.0–33.0)
MCHC: 34.2 g/dL (ref 32.0–36.0)
MCV: 86.1 fL (ref 80.0–100.0)
MONO ABS: 444 {cells}/uL (ref 200–950)
MPV: 9.6 fL (ref 7.5–12.5)
Monocytes Relative: 4 %
NEUTROS ABS: 7104 {cells}/uL (ref 1500–7800)
Neutrophils Relative %: 64 %
Platelets: 228 10*3/uL (ref 140–400)
RBC: 5.95 MIL/uL — ABNORMAL HIGH (ref 4.20–5.80)
RDW: 14.2 % (ref 11.0–15.0)
WBC: 11.1 10*3/uL — ABNORMAL HIGH (ref 3.8–10.8)

## 2017-01-24 NOTE — Progress Notes (Signed)
HPI:                                                                Brandon Phillips is a 51 y.o. male who presents to [place]: [facility] today to establish care   Current Concerns include cough  Patient with recent history of bronchitis (01/07/17) and lingular pneumonia (10/10/16) c/o ongoing cough x 6 months. Denies fevers, chills, nightsweats, unintentional weight loss. Cough is becoming more productive of sputum. Endorses dyspnea on exertion. Wife states she hears wheezing when he is sleeping at night. Patient is a current everyday smoker, approximately 30 packyears. He travels regularly to [place] for work.  Health Maintenance Health Maintenance  Topic Date Due  . HIV Screening  07/13/1982  . TETANUS/TDAP  07/13/1986  . INFLUENZA VACCINE  02/26/2017    Past Medical History:  Diagnosis Date  . Eczema   . Tobacco use disorder 01/24/2017   Past Surgical History:  Procedure Laterality Date  . HERNIA REPAIR     as an infant   Social History  Substance Use Topics  . Smoking status: Heavy Tobacco Smoker    Packs/day: 1.50    Years: 35.00    Types: Cigarettes  . Smokeless tobacco: Never Used  . Alcohol use No   family history is not on file.  ROS: Review of Systems  Constitutional: Negative.   HENT: Negative.   Respiratory: Positive for cough, sputum production and shortness of breath. Negative for hemoptysis.   Cardiovascular: Negative.   Gastrointestinal: Negative.   Skin: Negative.   Neurological: Negative.      Medications: Current Outpatient Prescriptions  Medication Sig Dispense Refill  . albuterol (PROVENTIL HFA;VENTOLIN HFA) 108 (90 Base) MCG/ACT inhaler Inhale 1-2 puffs into the lungs every 6 (six) hours as needed for wheezing or shortness of breath. 1 Inhaler 0   No current facility-administered medications for this visit.    No Known Allergies     Objective:  BP 110/69   Pulse 79   Temp 98 F (36.7 C)    Resp 18   Wt 136 lb 8 oz (61.9 kg)   SpO2 93%   BMI 22.71 kg/m  Gen: well-groomed, cooperative, not ill-appearing, no distress HEENT: normal conjunctiva, oropharynx clear, moist mucus membranes, neck supple, trachea midline, no cervical lymphadenopathy Pulm: Normal work of breathing, normal phonation, clear to auscultation bilaterally CV: Normal rate, regular rhythm, s1 and s2 distinct, no murmurs, clicks or rubs, no carotid bruit Neuro: alert and oriented x 3, EOM's intact, normal tone, no tremor MSK: moving all extremities, normal gait and station, no peripheral edema Skin: warm and dry, no rashes, no cyanosis Psych: normal affect, euthymic mood, normal speech and thought content   No results found for this or any previous visit (from the past 72 hour(s)). Dg Chest 2 View  Result Date: 01/24/2017 CLINICAL DATA:  Chronic cough for several months EXAM: CHEST  2 VIEW COMPARISON:  10/10/2016 FINDINGS: Cardiac shadow is within normal limits. The lungs are again hyperinflated without focal infiltrate. No acute bony abnormality is noted. IMPRESSION: COPD without acute abnormality. Electronically Signed   By: Inez Catalina M.D.   On: 01/24/2017 15:35      Assessment and Plan: 51 y.o. male with  1. Encounter to establish care - reviewed PMH  2. Tobacco use disorder - not open to cessation - has tried Chantix in the past - advised to cut back   3. Cough with sputum - symptoms suggestive of underlying COPD. Previous CXR showed lung hyperinflation. Chest x-ray today to assess for infiltrate and basic labs to r/o infection - DG Chest 2 View; Future - CBC with Differential/Platelet - Comprehensive metabolic panel - return in 1 week for Spirometry  4. Dyspnea on exertion - DG Chest 2 View; Future - Albuterol 1-2 puff Q6H prn    Patient education and anticipatory guidance given Patient agrees with treatment plan Follow-up in 1 week for PFT's or sooner as needed  Darlyne Russian [name]

## 2017-01-24 NOTE — Patient Instructions (Addendum)
- Plan to go downstairs for chest x-ray and labs today. We will contact you within 3-5 days with results (sooner if something is abnormal) - Cont Albuterol 1-2 puffs every 6 hours as needed for wheezing/shortness of breath - Follow-up in 1 week for spirometry/lung function testing - Cut back on cigarettes and consider smoking cessation options  Chronic Obstructive Pulmonary Disease Chronic obstructive pulmonary disease (COPD) is a common lung condition in which airflow from the lungs is limited. COPD is a general term that can be used to describe many different lung problems that limit airflow, including both chronic bronchitis and emphysema. If you have COPD, your lung function will probably never return to normal, but there are measures you can take to improve lung function and make yourself feel better. What are the causes?  Smoking (common).  Exposure to secondhand smoke.  Genetic problems.  Chronic inflammatory lung diseases or recurrent infections. What are the signs or symptoms?  Shortness of breath, especially with physical activity.  Deep, persistent (chronic) cough with a large amount of thick mucus.  Wheezing.  Rapid breaths (tachypnea).  Gray or bluish discoloration (cyanosis) of the skin, especially in your fingers, toes, or lips.  Fatigue.  Weight loss.  Frequent infections or episodes when breathing symptoms become much worse (exacerbations).  Chest tightness. How is this diagnosed? Your health care provider will take a medical history and perform a physical examination to diagnose COPD. Additional tests for COPD may include:  Lung (pulmonary) function tests.  Chest X-ray.  CT scan.  Blood tests.  How is this treated? Treatment for COPD may include:  Inhaler and nebulizer medicines. These help manage the symptoms of COPD and make your breathing more comfortable.  Supplemental oxygen. Supplemental oxygen is only helpful if you have a low oxygen level  in your blood.  Exercise and physical activity. These are beneficial for nearly all people with COPD.  Lung surgery or transplant.  Nutrition therapy to gain weight, if you are underweight.  Pulmonary rehabilitation. This may involve working with a team of health care providers and specialists, such as respiratory, occupational, and physical therapists.  Follow these instructions at home:  Take all medicines (inhaled or pills) as directed by your health care provider.  Avoid over-the-counter medicines or cough syrups that dry up your airway (such as antihistamines) and slow down the elimination of secretions unless instructed otherwise by your health care provider.  If you are a smoker, the most important thing that you can do is stop smoking. Continuing to smoke will cause further lung damage and breathing trouble. Ask your health care provider for help with quitting smoking. He or she can direct you to community resources or hospitals that provide support.  Avoid exposure to irritants such as smoke, chemicals, and fumes that aggravate your breathing.  Use oxygen therapy and pulmonary rehabilitation if directed by your health care provider. If you require home oxygen therapy, ask your health care provider whether you should purchase a pulse oximeter to measure your oxygen level at home.  Avoid contact with individuals who have a contagious illness.  Avoid extreme temperature and humidity changes.  Eat healthy foods. Eating smaller, more frequent meals and resting before meals may help you maintain your strength.  Stay active, but balance activity with periods of rest. Exercise and physical activity will help you maintain your ability to do things you want to do.  Preventing infection and hospitalization is very important when you have COPD. Make sure to receive  all the vaccines your health care provider recommends, especially the pneumococcal and influenza vaccines. Ask your health  care provider whether you need a pneumonia vaccine.  Learn and use relaxation techniques to manage stress.  Learn and use controlled breathing techniques as directed by your health care provider. Controlled breathing techniques include: 1. Pursed lip breathing. Start by breathing in (inhaling) through your nose for 1 second. Then, purse your lips as if you were going to whistle and breathe out (exhale) through the pursed lips for 2 seconds. 2. Diaphragmatic breathing. Start by putting one hand on your abdomen just above your waist. Inhale slowly through your nose. The hand on your abdomen should move out. Then purse your lips and exhale slowly. You should be able to feel the hand on your abdomen moving in as you exhale.  Learn and use controlled coughing to clear mucus from your lungs. Controlled coughing is a series of short, progressive coughs. The steps of controlled coughing are: 1. Lean your head slightly forward. 2. Breathe in deeply using diaphragmatic breathing. 3. Try to hold your breath for 3 seconds. 4. Keep your mouth slightly open while coughing twice. 5. Spit any mucus out into a tissue. 6. Rest and repeat the steps once or twice as needed. Contact a health care provider if:  You are coughing up more mucus than usual.  There is a change in the color or thickness of your mucus.  Your breathing is more labored than usual.  Your breathing is faster than usual. Get help right away if:  You have shortness of breath while you are resting.  You have shortness of breath that prevents you from: ? Being able to talk. ? Performing your usual physical activities.  You have chest pain lasting longer than 5 minutes.  Your skin color is more cyanotic than usual.  You measure low oxygen saturations for longer than 5 minutes with a pulse oximeter. This information is not intended to replace advice given to you by your health care provider. Make sure you discuss any questions you  have with your health care provider. Document Released: 04/24/2005 Document Revised: 12/21/2015 Document Reviewed: 03/11/2013 Elsevier Interactive Patient Education  2017 [employer].

## 2017-01-25 LAB — COMPREHENSIVE METABOLIC PANEL
ALBUMIN: 4.3 g/dL (ref 3.6–5.1)
ALT: 17 U/L (ref 9–46)
AST: 13 U/L (ref 10–40)
Alkaline Phosphatase: 52 U/L (ref 40–115)
BILIRUBIN TOTAL: 0.3 mg/dL (ref 0.2–1.2)
BUN: 11 mg/dL (ref 7–25)
CHLORIDE: 99 mmol/L (ref 98–110)
CO2: 26 mmol/L (ref 20–31)
CREATININE: 0.96 mg/dL (ref 0.60–1.35)
Calcium: 9.3 mg/dL (ref 8.6–10.3)
Glucose, Bld: 80 mg/dL (ref 65–99)
Potassium: 4.1 mmol/L (ref 3.5–5.3)
SODIUM: 137 mmol/L (ref 135–146)
TOTAL PROTEIN: 6.8 g/dL (ref 6.1–8.1)

## 2017-01-28 NOTE — Progress Notes (Signed)
Chest x-ray shows COPD changes. No pneumonia or acute abnormality. Recommend follow-up spirometry

## 2017-01-30 NOTE — Progress Notes (Signed)
White blood cell count is mildly elevated. This could be a resolving infection. Recommend recheck in 2 weeks to make sure its trending down Red blood cell count is mildly elevated as well as hemoglobin. This is often caused by smoking Other labs look good

## 2017-01-31 ENCOUNTER — Ambulatory Visit (INDEPENDENT_AMBULATORY_CARE_PROVIDER_SITE_OTHER): Payer: Commercial Managed Care - PPO | Admitting: Physician Assistant

## 2017-01-31 ENCOUNTER — Encounter: Payer: Self-pay | Admitting: Physician Assistant

## 2017-01-31 VITALS — BP 126/83 | HR 67 | Wt 136.0 lb

## 2017-01-31 DIAGNOSIS — F17200 Nicotine dependence, unspecified, uncomplicated: Secondary | ICD-10-CM | POA: Diagnosis not present

## 2017-01-31 DIAGNOSIS — J449 Chronic obstructive pulmonary disease, unspecified: Secondary | ICD-10-CM | POA: Diagnosis not present

## 2017-01-31 DIAGNOSIS — J209 Acute bronchitis, unspecified: Secondary | ICD-10-CM | POA: Insufficient documentation

## 2017-01-31 DIAGNOSIS — J441 Chronic obstructive pulmonary disease with (acute) exacerbation: Secondary | ICD-10-CM | POA: Insufficient documentation

## 2017-01-31 MED ORDER — TIOTROPIUM BROMIDE MONOHYDRATE 18 MCG IN CAPS: 18.0000 ug | ORAL_CAPSULE | Freq: Every day | RESPIRATORY_TRACT | 3 refills | Status: DC

## 2017-01-31 MED ORDER — VARENICLINE TARTRATE 0.5 MG X 11 & 1 MG X 42 PO MISC: ORAL | 0 refills | Status: DC

## 2017-01-31 NOTE — Patient Instructions (Addendum)
- Start Spiriva once daily every morning. You can have the pharmacist instruct you on how to use this type of inhaler  - Follow-up in 3 weeks for Spirometry  - Start Chantix starter pack. Set Quit Date. Limit smoking to first 7 days. Follow-up in 4 weeks for refill  Steps to Quit Smoking Smoking tobacco can be harmful to your health and can affect almost every organ in your body. Smoking puts you, and those around you, at risk for developing many serious chronic diseases. Quitting smoking is difficult, but it is one of the best things that you can do for your health. It is never too late to quit. What are the benefits of quitting smoking? When you quit smoking, you lower your risk of developing serious diseases and conditions, such as:  Lung cancer or lung disease, such as COPD.  Heart disease.  Stroke.  Heart attack.  Infertility.  Osteoporosis and bone fractures.  Additionally, symptoms such as coughing, wheezing, and shortness of breath may get better when you quit. You may also find that you get sick less often because your body is stronger at fighting off colds and infections. If you are pregnant, quitting smoking can help to reduce your chances of having a baby of low birth weight. How do I get ready to quit? When you decide to quit smoking, create a plan to make sure that you are successful. Before you quit:  Pick a date to quit. Set a date within the next two weeks to give you time to prepare.  Write down the reasons why you are quitting. Keep this list in places where you will see it often, such as on your bathroom mirror or in your car or wallet.  Identify the people, places, things, and activities that make you want to smoke (triggers) and avoid them. Make sure to take these actions: ? Throw away all cigarettes at home, at work, and in your car. ? Throw away smoking accessories, such as Scientist, research (medical). ? Clean your car and make sure to empty the ashtray. ? Clean  your home, including curtains and carpets.  Tell your family, friends, and coworkers that you are quitting. Support from your loved ones can make quitting easier.  Talk with your health care provider about your options for quitting smoking.  Find out what treatment options are covered by your health insurance.  What strategies can I use to quit smoking? Talk with your healthcare provider about different strategies to quit smoking. Some strategies include:  Quitting smoking altogether instead of gradually lessening how much you smoke over a period of time. Research shows that quitting "cold [place]" is more successful than gradually quitting.  Attending in-person counseling to help you build problem-solving skills. You are more likely to have success in quitting if you attend several counseling sessions. Even short sessions of 10 minutes can be effective.  Finding resources and support systems that can help you to quit smoking and remain smoke-free after you quit. These resources are most helpful when you use them often. They can include: ? Online chats with a Social worker. ? Telephone quitlines. ? Careers information officer. ? Support groups or group counseling. ? Text messaging programs. ? Mobile phone applications.  Taking medicines to help you quit smoking. (If you are pregnant or breastfeeding, talk with your health care provider first.) Some medicines contain nicotine and some do not. Both types of medicines help with cravings, but the medicines that include nicotine help to relieve withdrawal  symptoms. Your health care provider may recommend: ? Nicotine patches, gum, or lozenges. ? Nicotine inhalers or sprays. ? Non-nicotine medicine that is taken by mouth.  Talk with your health care provider about combining strategies, such as taking medicines while you are also receiving in-person counseling. Using these two strategies together makes you more likely to succeed in quitting than if  you used either strategy on its own. If you are pregnant or breastfeeding, talk with your health care provider about finding counseling or other support strategies to quit smoking. Do not take medicine to help you quit smoking unless told to do so by your health care provider. What things can I do to make it easier to quit? Quitting smoking might feel overwhelming at first, but there is a lot that you can do to make it easier. Take these important actions:  Reach out to your family and friends and ask that they support and encourage you during this time. Call telephone quitlines, reach out to support groups, or work with a counselor for support.  Ask people who smoke to avoid smoking around you.  Avoid places that trigger you to smoke, such as bars, parties, or smoke-break areas at work.  Spend time around people who do not smoke.  Lessen stress in your life, because stress can be a smoking trigger for some people. To lessen stress, try: ? Exercising regularly. ? Deep-breathing exercises. ? Yoga. ? Meditating. ? Performing a body scan. This involves closing your eyes, scanning your body from head to toe, and noticing which parts of your body are particularly tense. Purposefully relax the muscles in those areas.  Download or purchase mobile phone or tablet apps (applications) that can help you stick to your quit plan by providing reminders, tips, and encouragement. There are many free apps, such as QuitGuide from the [employer] Office manager for Disease Control and Prevention). You can find other support for quitting smoking (smoking cessation) through smokefree.gov and other websites.  How will I feel when I quit smoking? Within the first 24 hours of quitting smoking, you may start to feel some withdrawal symptoms. These symptoms are usually most noticeable 2-3 days after quitting, but they usually do not last beyond 2-3 weeks. Changes or symptoms that you might experience include:  Mood  swings.  Restlessness, anxiety, or irritation.  Difficulty concentrating.  Dizziness.  Strong cravings for sugary foods in addition to nicotine.  Mild weight gain.  Constipation.  Nausea.  Coughing or a sore throat.  Changes in how your medicines work in your body.  A depressed mood.  Difficulty sleeping (insomnia).  After the first 2-3 weeks of quitting, you may start to notice more positive results, such as:  Improved sense of smell and taste.  Decreased coughing and sore throat.  Slower heart rate.  Lower blood pressure.  Clearer skin.  The ability to breathe more easily.  Fewer sick days.  Quitting smoking is very challenging for most people. Do not get discouraged if you are not successful the first time. Some people need to make many attempts to quit before they achieve long-term success. Do your best to stick to your quit plan, and talk with your health care provider if you have any questions or concerns. This information is not intended to replace advice given to you by your health care provider. Make sure you discuss any questions you have with your health care provider. Document Released: 07/09/2001 Document Revised: 03/12/2016 Document Reviewed: 11/29/2014 Elsevier Interactive Patient Education  2017  [employer].

## 2017-01-31 NOTE — Progress Notes (Signed)
HPI:                                                                Brandon Phillips is a 51 y.o. male who presents to [facility] [place] today for follow-up chronic cough  Patient presents for follow-up of chronic cough, persistent x 6 months. He was meant to schedule spirometry today, but there was a misunderstanding with scheduling. Most recent CXR shows COPD changes of lung hyperinflation. Patient has a 71 packyear smoking history. He endorses daily productive cough, wheezing, and DOE.   Past Medical History:  Diagnosis Date  . CAP (community acquired pneumonia)   . Eczema   . Tobacco use disorder 01/24/2017   Past Surgical History:  Procedure Laterality Date  . HERNIA REPAIR     as an infant   Social History  Substance Use Topics  . Smoking status: Heavy Tobacco Smoker    Packs/day: 1.50    Years: 35.00    Types: Cigarettes  . Smokeless tobacco: Never Used  . Alcohol use No   family history is not on file.  ROS: negative except as noted in the HPI  Medications: Current Outpatient Prescriptions  Medication Sig Dispense Refill  . albuterol (PROVENTIL HFA;VENTOLIN HFA) 108 (90 Base) MCG/ACT inhaler Inhale 1-2 puffs into the lungs every 6 (six) hours as needed for wheezing or shortness of breath. 1 Inhaler 0   No current facility-administered medications for this visit.    No Known Allergies     Objective:  BP 126/83   Pulse 67   Wt 136 lb (61.7 kg)   BMI 22.63 kg/m  Gen: well-groomed, cooperative, not ill-appearing, no distress Pulm: Normal work of breathing, normal phonation, diffuse expiratory wheezes bilaterally CV: Normal rate, regular rhythm, s1 and s2 distinct, no murmurs, clicks or rubs  Neuro: alert and oriented x 3, EOM's intact, no tremor MSK: extremities atraumatic, normal gait and station Skin: warm, dry, intact; no cyanosis Psych: good eye contact, normal affect, euthymic mood, normal speech and thought  content    No results found for this or any previous visit (from the past 72 hour(s)). No results found.  CLINICAL DATA:  Chronic cough for several months  EXAM: CHEST  2 VIEW  COMPARISON:  10/10/2016  FINDINGS: Cardiac shadow is within normal limits. The lungs are again hyperinflated without focal infiltrate. No acute bony abnormality is noted.  IMPRESSION: COPD without acute abnormality.   Electronically Signed   By: Inez Catalina M.D.   On: 01/24/2017 15:35  Assessment and Plan: 51 y.o. male with   1. Tobacco use disorder - patient is willing to try Chantix. He is accompanied by his wife today who states they will quit together. - varenicline (CHANTIX STARTING MONTH PAK) 0.5 MG X 11 & 1 MG X 42 tablet; Take one 0.5mg  tablet by mouth once daily for 3 days, then increase to one 0.5mg  tablet twice daily for 3 days, then increase to one 1mg  tablet twice daily.  Dispense: 53 tablet; Refill: 0 - follow-up in 4 weeks for refills  2. Chronic obstructive pulmonary disease, unspecified COPD type ([place]) - personally reviewed CXR from 01/24/17, showing hyperinflation without focal infiltrate - starting daily anticholinergic - cont Albuterol prn - smoking cessation  plan as above - follow-up in 3 weeks for Spirometry - tiotropium (SPIRIVA) 18 MCG inhalation capsule; Place 1 capsule (18 mcg total) into inhaler and inhale daily.  Dispense: 90 capsule; Refill: 3  Patient education and anticipatory guidance given Patient agrees with treatment plan Follow-up in 3 weeks or sooner as needed if symptoms worsen or fail to improve  Darlyne Russian [name]

## 2017-04-03 ENCOUNTER — Encounter: Payer: Self-pay | Admitting: Physician Assistant

## 2017-04-03 ENCOUNTER — Ambulatory Visit (INDEPENDENT_AMBULATORY_CARE_PROVIDER_SITE_OTHER): Payer: Commercial Managed Care - PPO | Admitting: Physician Assistant

## 2017-04-03 ENCOUNTER — Other Ambulatory Visit: Payer: Self-pay | Admitting: Physician Assistant

## 2017-04-03 VITALS — BP 123/80 | HR 84 | Wt 145.0 lb

## 2017-04-03 DIAGNOSIS — F17200 Nicotine dependence, unspecified, uncomplicated: Secondary | ICD-10-CM | POA: Diagnosis not present

## 2017-04-03 DIAGNOSIS — Z789 Other specified health status: Secondary | ICD-10-CM | POA: Insufficient documentation

## 2017-04-03 DIAGNOSIS — Z716 Tobacco abuse counseling: Secondary | ICD-10-CM | POA: Diagnosis not present

## 2017-04-03 DIAGNOSIS — Z720 Tobacco use: Secondary | ICD-10-CM

## 2017-04-03 DIAGNOSIS — J449 Chronic obstructive pulmonary disease, unspecified: Secondary | ICD-10-CM

## 2017-04-03 MED ORDER — ALBUTEROL SULFATE HFA 108 (90 BASE) MCG/ACT IN AERS: 1.0000 | INHALATION_SPRAY | Freq: Four times a day (QID) | RESPIRATORY_TRACT | 3 refills | Status: DC | PRN

## 2017-04-03 MED ORDER — VARENICLINE TARTRATE 1 MG PO TABS: 1.0000 mg | ORAL_TABLET | Freq: Two times a day (BID) | ORAL | 2 refills | Status: DC

## 2017-04-03 MED ORDER — ALBUTEROL SULFATE HFA 108 (90 BASE) MCG/ACT IN AERS: 1.0000 | INHALATION_SPRAY | Freq: Four times a day (QID) | RESPIRATORY_TRACT | 2 refills | Status: DC | PRN

## 2017-04-03 NOTE — Progress Notes (Signed)
HPI:                                                                Brandon Phillips is a 51 y.o. male who presents to [facility] today for smoking cessation counseling  Patient has been using Chantix for the past 8 weeks. He used the gradual quit approach. Quit date 03/30/2017. Reports he is vaping 5 times daily currently. Denies any adverse side effects from the Chantix. Reports he feels like his shortness of breath has improved. Requesting a refill of his Albuterol inhaler.  Past Medical History:  Diagnosis Date  . CAP (community acquired pneumonia)   . Eczema   . Tobacco use disorder 01/24/2017   Past Surgical History:  Procedure Laterality Date  . HERNIA REPAIR     as an infant   Social History  Substance Use Topics  . Smoking status: Former Smoker    Packs/day: 1.50    Years: 35.00    Types: Cigarettes    Quit date: 03/30/2017  . Smokeless tobacco: Never Used  . Alcohol use No   family history is not on file.  ROS: negative except as noted in the HPI  Medications: Current Outpatient Prescriptions  Medication Sig Dispense Refill  . albuterol (PROVENTIL HFA;VENTOLIN HFA) 108 (90 Base) MCG/ACT inhaler Inhale 1-2 puffs into the lungs every 6 (six) hours as needed for wheezing or shortness of breath. 1 Inhaler 2  . tiotropium (SPIRIVA) 18 MCG inhalation capsule Place 1 capsule (18 mcg total) into inhaler and inhale daily. 90 capsule 3  . varenicline (CHANTIX) 1 MG tablet Take 1 tablet (1 mg total) by mouth 2 (two) times daily. 60 tablet 2   No current facility-administered medications for this visit.    No Known Allergies   Objective:  BP 123/80   Pulse 84   Wt 145 lb (65.8 kg)   SpO2 97%   BMI 24.13 kg/m  Gen:  alert, not ill-appearing, no distress, appropriate for age 59: head normocephalic without obvious abnormality, conjunctiva and cornea clear, trachea midline Pulm: Normal work of breathing, normal  phonation Neuro: alert and oriented x 3, no tremor MSK: extremities atraumatic, normal gait and station Skin: intact, no rashes on exposed skin, no jaundice, no cyanosis Psych: well-groomed, cooperative, good eye contact, euthymic mood, affect mood-congruent, speech is articulate, and thought processes clear and goal-directed  Depression screen [facility] 2/9 04/03/2017  Decreased Interest 0  Down, Depressed, Hopeless 0  PHQ - 2 Score 0      No results found for this or any previous visit (from the past 72 hour(s)). No results found.    Assessment and Plan: 51 y.o. male with   1. Encounter for smoking cessation counseling - plan to continue Chantix 3 months from quit date - varenicline (CHANTIX) 1 MG tablet; Take 1 tablet (1 mg total) by mouth 2 (two) times daily.  Dispense: 60 tablet; Refill: 2  2. Tobacco use disorder - declines influenza vaccine - declines Pneumovax. Would like to research it first - varenicline (CHANTIX) 1 MG tablet; Take 1 tablet (1 mg total) by mouth 2 (two) times daily.  Dispense: 60 tablet; Refill: 2  3. Electronic cigarette use   4. Chronic obstructive pulmonary disease, unspecified  COPD type ([place]) - albuterol (PROVENTIL HFA;VENTOLIN HFA) 108 (90 Base) MCG/ACT inhaler; Inhale 1-2 puffs into the lungs every 6 (six) hours as needed for wheezing or shortness of breath.  Dispense: 1 Inhaler; Refill: 2   Patient education and anticipatory guidance given Patient agrees with treatment plan Follow-up in 3 months or sooner as needed if symptoms worsen or fail to improve  Darlyne Russian [name]

## 2017-04-03 NOTE — Patient Instructions (Signed)
Coping with Quitting Smoking Quitting smoking is a physical and mental challenge. You will face cravings, withdrawal symptoms, and temptation. Before quitting, work with your health care provider to make a plan that can help you cope. Preparation can help you quit and keep you from giving in. How can I cope with cravings? Cravings usually last for 5-10 minutes. If you get through it, the craving will pass. Consider taking the following actions to help you cope with cravings:  Keep your mouth busy: ? Chew sugar-free gum. ? Suck on hard candies or a straw. ? Brush your teeth.  Keep your hands and body busy: ? Immediately change to a different activity when you feel a craving. ? Squeeze or play with a ball. ? Do an activity or a hobby, like making bead jewelry, practicing needlepoint, or working with wood. ? Mix up your normal routine. ? Take a short exercise break. Go for a quick walk or run up and down stairs. ? Spend time in public places where smoking is not allowed.  Focus on doing something kind or helpful for someone else.  Call a friend or family member to talk during a craving.  Join a support group.  Call a quit line, such as 1-800-QUIT-NOW.  Talk with your health care provider about medicines that might help you cope with cravings and make quitting easier for you.  How can I deal with withdrawal symptoms? Your body may experience negative effects as it tries to get used to not having nicotine in the system. These effects are called withdrawal symptoms. They may include:  Feeling hungrier than normal.  Trouble concentrating.  Irritability.  Trouble sleeping.  Feeling depressed.  Restlessness and agitation.  Craving a cigarette.  To manage withdrawal symptoms:  Avoid places, people, and activities that trigger your cravings.  Remember why you want to quit.  Get plenty of sleep.  Avoid coffee and other caffeinated drinks. These may worsen some of your  symptoms.  How can I handle social situations? Social situations can be difficult when you are quitting smoking, especially in the first few weeks. To manage this, you can:  Avoid parties, bars, and other social situations where people might be smoking.  Avoid alcohol.  Leave right away if you have the urge to smoke.  Explain to your family and friends that you are quitting smoking. Ask for understanding and support.  Plan activities with friends or family where smoking is not an option.  What are some ways I can cope with stress? Wanting to smoke may cause stress, and stress can make you want to smoke. Find ways to manage your stress. Relaxation techniques can help. For example:  Breathe slowly and deeply, in through your nose and out through your mouth.  Listen to soothing, relaxing music.  Talk with a family member or friend about your stress.  Light a candle.  Soak in a bath or take a shower.  Think about a peaceful place.  What are some ways I can prevent weight gain? Be aware that many people gain weight after they quit smoking. However, not everyone does. To keep from gaining weight, have a plan in place before you quit and stick to the plan after you quit. Your plan should include:  Having healthy snacks. When you have a craving, it may help to: ? Eat plain popcorn, crunchy carrots, celery, or other cut vegetables. ? Chew sugar-free gum.  Changing how you eat: ? Eat small portion sizes at meals. ?   Eat 4-6 small meals throughout the day instead of 1-2 large meals a day. ? Be mindful when you eat. Do not watch television or do other things that might distract you as you eat.  Exercising regularly: ? Make time to exercise each day. If you do not have time for a long workout, do short bouts of exercise for 5-10 minutes several times a day. ? Do some form of strengthening exercise, like weight lifting, and some form of aerobic exercise, like running or  swimming.  Drinking plenty of water or other low-calorie or no-calorie drinks. Drink 6-8 glasses of water daily, or as much as instructed by your health care provider.  Summary  Quitting smoking is a physical and mental challenge. You will face cravings, withdrawal symptoms, and temptation to smoke again. Preparation can help you as you go through these challenges.  You can cope with cravings by keeping your mouth busy (such as by chewing gum), keeping your body and hands busy, and making calls to family, friends, or a helpline for people who want to quit smoking.  You can cope with withdrawal symptoms by avoiding places where people smoke, avoiding drinks with caffeine, and getting plenty of rest.  Ask your health care provider about the different ways to prevent weight gain, avoid stress, and handle social situations. This information is not intended to replace advice given to you by your health care provider. Make sure you discuss any questions you have with your health care provider. Document Released: 07/12/2016 Document Revised: 07/12/2016 Document Reviewed: 07/12/2016 Elsevier Interactive Patient Education  2018 [employer]  

## 2017-04-08 ENCOUNTER — Other Ambulatory Visit: Payer: Self-pay

## 2017-04-08 DIAGNOSIS — Z716 Tobacco abuse counseling: Secondary | ICD-10-CM

## 2017-04-08 DIAGNOSIS — F17200 Nicotine dependence, unspecified, uncomplicated: Secondary | ICD-10-CM

## 2017-04-08 MED ORDER — VARENICLINE TARTRATE 1 MG PO TABS: 1.0000 mg | ORAL_TABLET | Freq: Two times a day (BID) | ORAL | 2 refills | Status: DC

## 2017-04-29 ENCOUNTER — Encounter: Payer: Self-pay | Admitting: Physician Assistant

## 2017-04-29 ENCOUNTER — Ambulatory Visit (INDEPENDENT_AMBULATORY_CARE_PROVIDER_SITE_OTHER): Payer: Commercial Managed Care - PPO | Admitting: Physician Assistant

## 2017-04-29 VITALS — BP 146/77 | HR 61 | Wt 150.0 lb

## 2017-04-29 DIAGNOSIS — N611 Abscess of the breast and nipple: Secondary | ICD-10-CM

## 2017-04-29 MED ORDER — DOXYCYCLINE HYCLATE 100 MG PO TABS: 100.0000 mg | ORAL_TABLET | Freq: Two times a day (BID) | ORAL | 0 refills | Status: AC

## 2017-04-29 NOTE — Progress Notes (Signed)
HPI:                                                                Brandon Phillips is a 51 y.o. male who presents to [place]: [place] today for cyst  Patient reports a tender, warm, red palpable mass adjacent to his right areola present for 9 days. Reports he was scratched by his dog in that area. He has tried warm compresses and OTC salve without much improvement.  Past Medical History:  Diagnosis Date  . CAP (community acquired pneumonia)   . Eczema   . Tobacco use disorder 01/24/2017   Past Surgical History:  Procedure Laterality Date  . HERNIA REPAIR     as an infant   Social History  Substance Use Topics  . Smoking status: Former Smoker    Packs/day: 1.50    Years: 35.00    Types: Cigarettes    Quit date: 03/30/2017  . Smokeless tobacco: Never Used  . Alcohol use No   family history is not on file.  ROS: negative except as noted in the HPI  Medications: Current Outpatient Prescriptions  Medication Sig Dispense Refill  . tiotropium (SPIRIVA) 18 MCG inhalation capsule Place 1 capsule (18 mcg total) into inhaler and inhale daily. 90 capsule 3  . varenicline (CHANTIX) 1 MG tablet Take 1 tablet (1 mg total) by mouth 2 (two) times daily. 60 tablet 2  . doxycycline (VIBRA-TABS) 100 MG tablet Take 1 tablet (100 mg total) by mouth 2 (two) times daily. 14 tablet 0   No current facility-administered medications for this visit.    No Known Allergies     Objective:  BP (!) 146/77   Pulse 61   Wt 150 lb (68 kg)   BMI 24.96 kg/m  Gen:  alert, not ill-appearing, no distress, appropriate for age 57: head normocephalic without obvious abnormality, conjunctiva and cornea clear, trachea midline Pulm: Normal work of breathing, normal phonation Neuro: alert and oriented x 3, no tremor MSK: extremities atraumatic, normal gait and station Skin: approx 1.5 cm erythematous nodule adjacent to the right side of the areola,  indurated   No results found for this or any previous visit (from the past 72 hour(s)). No results found.    Assessment and Plan: 51 y.o. male with   1. Acute abscess of areola - abscess is not fluctuant enough for incision and drainage - doxycyline, warm compresses and massage - doxycycline (VIBRA-TABS) 100 MG tablet; Take 1 tablet (100 mg total) by mouth 2 (two) times daily.  Dispense: 14 tablet; Refill: 0     Patient education and anticipatory guidance given Patient agrees with treatment plan Follow-up in 1 week or sooner as needed if symptoms worsen or fail to improve  Darlyne Russian [name]

## 2017-04-29 NOTE — Patient Instructions (Signed)

## 2017-05-07 ENCOUNTER — Encounter: Payer: Self-pay | Admitting: Physician Assistant

## 2017-05-07 ENCOUNTER — Ambulatory Visit (INDEPENDENT_AMBULATORY_CARE_PROVIDER_SITE_OTHER): Payer: Commercial Managed Care - PPO | Admitting: Physician Assistant

## 2017-05-07 VITALS — BP 129/88 | HR 59 | Ht 65.0 in | Wt 148.0 lb

## 2017-05-07 DIAGNOSIS — K2900 Acute gastritis without bleeding: Secondary | ICD-10-CM

## 2017-05-07 DIAGNOSIS — R1011 Right upper quadrant pain: Secondary | ICD-10-CM | POA: Insufficient documentation

## 2017-05-07 DIAGNOSIS — I10 Essential (primary) hypertension: Secondary | ICD-10-CM | POA: Diagnosis not present

## 2017-05-07 MED ORDER — ESOMEPRAZOLE MAGNESIUM 10 MG PO PACK: 10.0000 mg | PACK | Freq: Every day | ORAL | 0 refills | Status: DC

## 2017-05-07 NOTE — Patient Instructions (Addendum)
- Avoid over-the-counter pain relievers for the next month - Okay to continue baby aspirin (81mg ) - GERD diet (below) - Labs today. Ultrasound will be scheduled - Start antacid medication, you can take it either before bed or before breakfast - Follow-up in 4 weeks  For your blood pressure: - Cont baby aspirin 81 mg to help prevent heart attack/stroke - Check blood pressure at home for the next 2 weeks - Check around the same time each day in a relaxed setting - Limit salt to <2000 mg/day - Follow DASH eating plan - limit alcohol to 2 standard drinks per day - avoid tobacco products - Follow-up in 4 weeks   Food Choices for Gastroesophageal Reflux Disease, Adult When you have gastroesophageal reflux disease (GERD), the foods you eat and your eating habits are very important. Choosing the right foods can help ease the discomfort of GERD. Consider working with a diet and nutrition specialist (dietitian) to help you make healthy food choices. What general guidelines should I follow? Eating plan  Choose healthy foods low in fat, such as fruits, vegetables, whole grains, low-fat dairy products, and lean meat, fish, and poultry.  Eat frequent, small meals instead of three large meals each day. Eat your meals slowly, in a relaxed setting. Avoid bending over or lying down until 2-3 hours after eating.  Limit high-fat foods such as fatty meats or fried foods.  Limit your intake of oils, butter, and shortening to less than 8 teaspoons each day.  Avoid the following: ? Foods that cause symptoms. These may be different for different people. Keep a food diary to keep track of foods that cause symptoms. ? Alcohol. ? Drinking large amounts of liquid with meals. ? Eating meals during the 2-3 hours before bed.  Cook foods using methods other than frying. This may include baking, grilling, or broiling. Lifestyle   Maintain a healthy weight. Ask your health care provider what weight is healthy  for you. If you need to lose weight, work with your health care provider to do so safely.  Exercise for at least 30 minutes on 5 or more days each week, or as told by your health care provider.  Avoid wearing clothes that fit tightly around your waist and chest.  Do not use any products that contain nicotine or tobacco, such as cigarettes and e-cigarettes. If you need help quitting, ask your health care provider.  Sleep with the head of your bed raised. Use a wedge under the mattress or blocks under the bed frame to raise the head of the bed. What foods are not recommended? The items listed may not be a complete list. Talk with your dietitian about what dietary choices are best for you. Grains Pastries or quick breads with added fat. [place] toast. Vegetables Deep fried vegetables. [place] fries. Any vegetables prepared with added fat. Any vegetables that cause symptoms. For some people this may include tomatoes and tomato products, chili peppers, onions and garlic, and horseradish. Fruits Any fruits prepared with added fat. Any fruits that cause symptoms. For some people this may include citrus fruits, such as oranges, grapefruit, pineapple, and lemons. Meats and other protein foods High-fat meats, such as fatty beef or pork, hot dogs, ribs, ham, sausage, salami and bacon. Fried meat or protein, including fried fish and fried chicken. Nuts and nut butters. Dairy Whole milk and chocolate milk. Sour cream. Cream. Ice cream. Cream cheese. Milk shakes. Beverages Coffee and tea, with or without caffeine. Carbonated beverages. Sodas. Energy  drinks. Fruit juice made with acidic fruits (such as orange or grapefruit). Tomato juice. Alcoholic drinks. Fats and oils Butter. Margarine. Shortening. Ghee. Sweets and desserts Chocolate and cocoa. Donuts. Seasoning and other foods Pepper. Peppermint and spearmint. Any condiments, herbs, or seasonings that cause symptoms. For some people, this may include  curry, hot sauce, or vinegar-based salad dressings. Summary  When you have gastroesophageal reflux disease (GERD), food and lifestyle choices are very important to help ease the discomfort of GERD.  Eat frequent, small meals instead of three large meals each day. Eat your meals slowly, in a relaxed setting. Avoid bending over or lying down until 2-3 hours after eating.  Limit high-fat foods such as fatty meat or fried foods. This information is not intended to replace advice given to you by your health care provider. Make sure you discuss any questions you have with your health care provider. Document Released: 07/15/2005 Document Revised: 07/16/2016 Document Reviewed: 07/16/2016 Elsevier Interactive Patient Education  2017 [employer].

## 2017-05-07 NOTE — Progress Notes (Signed)
HPI:                                                                Brandon Phillips is a 51 y.o. male who presents to [place]: [place] today for RUQ pain  Abdominal Pain  This is a new problem. The current episode started 1 to 4 weeks ago. The onset quality is undetermined. The problem occurs daily. The problem has been unchanged. The pain is located in the RUQ. The pain is mild. The quality of the pain is aching. The abdominal pain does not radiate. Pertinent negatives include no anorexia, belching, constipation, diarrhea, fever, flatus, melena, nausea or vomiting. The pain is aggravated by eating. The pain is relieved by nothing. He has tried nothing for the symptoms. His past medical history is significant for abdominal surgery. There is no history of Crohn's disease, gallstones, irritable bowel syndrome, pancreatitis or ulcerative colitis.     Past Medical History:  Diagnosis Date  . CAP (community acquired pneumonia)   . Eczema   . Tobacco use disorder 01/24/2017   Past Surgical History:  Procedure Laterality Date  . HERNIA REPAIR     as an infant   Social History  Substance Use Topics  . Smoking status: Former Smoker    Packs/day: 1.50    Years: 35.00    Types: Cigarettes    Quit date: 03/30/2017  . Smokeless tobacco: Never Used  . Alcohol use No   family history is not on file.  ROS: negative except as noted in the HPI  Medications: Current Outpatient Prescriptions  Medication Sig Dispense Refill  . BABY ASPIRIN PO Take by mouth.    . tiotropium (SPIRIVA) 18 MCG inhalation capsule Place 1 capsule (18 mcg total) into inhaler and inhale daily. 90 capsule 3  . varenicline (CHANTIX) 1 MG tablet Take 1 tablet (1 mg total) by mouth 2 (two) times daily. 60 tablet 2  . esomeprazole (NEXIUM) 10 MG packet Take 10 mg by mouth at bedtime. 30 each 0   No current facility-administered medications for this visit.    No Known  Allergies     Objective:  BP 129/88   Pulse (!) 59   Ht 5\' 5"  (1.651 m)   Wt 148 lb (67.1 kg)   BMI 24.63 kg/m  Gen:  alert, not ill-appearing, no distress, appropriate for age 20: head normocephalic without obvious abnormality, conjunctiva and cornea clear, trachea midline Pulm: Normal work of breathing, normal phonation GI: abdomen normal appearing, soft, non-distender, non-tender, negative Murphy's sign Neuro: alert and oriented x 3, no tremor MSK: extremities atraumatic, normal gait and station Skin: intact, no rashes on exposed skin, no jaundice, no cyanosis Psych: well-groomed, cooperative, good eye contact, euthymic mood, affect mood-congruent, speech is articulate, and thought processes clear and goal-directed   No results found for this or any previous visit (from the past 72 hour(s)). No results found.   Assessment and Plan: 51 y.o. male with   1. Postprandial RUQ pain - differential includes fatty liver disease, gallbladder/biliary tree pathology, PUD, gastritis, GERD - labs and abdominal US - avoid NSAIDs, GERD diet, trial of PPI. Follow-up in 4 weeks - CBC with Differential/Platelet - Comprehensive metabolic panel - Lipase - US ABDOMEN LIMITED  RUQ  2. Hypertension goal BP (blood pressure) < 130/80 BP Readings from Last 3 Encounters:  05/07/17 129/88  04/29/17 (!) 146/77  04/03/17 123/80  - declines medication management. Risks, benefits discussed - counseled on therapeutic lifestyle changes - he is working on smoking cessation with Chantix   3. Acute gastritis, presence of bleeding unspecified, unspecified gastritis type - esomeprazole (NEXIUM) 10 MG packet; Take 10 mg by mouth at bedtime.  Dispense: 30 each; Refill: 0   Patient education and anticipatory guidance given Patient agrees with treatment plan Follow-up in 4 weeks or sooner as needed if symptoms worsen or fail to improve  Darlyne Russian [name]

## 2017-05-08 LAB — CBC WITH DIFFERENTIAL/PLATELET
BASOS ABS: 96 {cells}/uL (ref 0–200)
Basophils Relative: 1 %
EOS ABS: 288 {cells}/uL (ref 15–500)
EOS PCT: 3 %
HEMATOCRIT: 46.2 % (ref 38.5–50.0)
Hemoglobin: 16 g/dL (ref 13.2–17.1)
Lymphs Abs: 3101 cells/uL (ref 850–3900)
MCH: 28.5 pg (ref 27.0–33.0)
MCHC: 34.6 g/dL (ref 32.0–36.0)
MCV: 82.2 fL (ref 80.0–100.0)
MPV: 10.1 fL (ref 7.5–12.5)
Monocytes Relative: 6.1 %
NEUTROS PCT: 57.6 %
Neutro Abs: 5530 cells/uL (ref 1500–7800)
PLATELETS: 245 10*3/uL (ref 140–400)
RBC: 5.62 10*6/uL (ref 4.20–5.80)
RDW: 13 % (ref 11.0–15.0)
TOTAL LYMPHOCYTE: 32.3 %
WBC: 9.6 10*3/uL (ref 3.8–10.8)
WBCMIX: 586 {cells}/uL (ref 200–950)

## 2017-05-08 LAB — COMPREHENSIVE METABOLIC PANEL
AG Ratio: 1.6 (calc) (ref 1.0–2.5)
ALKALINE PHOSPHATASE (APISO): 50 U/L (ref 40–115)
ALT: 30 U/L (ref 9–46)
AST: 19 U/L (ref 10–40)
Albumin: 4.4 g/dL (ref 3.6–5.1)
BILIRUBIN TOTAL: 0.5 mg/dL (ref 0.2–1.2)
BUN: 13 mg/dL (ref 7–25)
CALCIUM: 9.5 mg/dL (ref 8.6–10.3)
CO2: 32 mmol/L (ref 20–32)
CREATININE: 1.03 mg/dL (ref 0.60–1.35)
Chloride: 98 mmol/L (ref 98–110)
Globulin: 2.8 g/dL (calc) (ref 1.9–3.7)
Glucose, Bld: 86 mg/dL (ref 65–99)
Potassium: 4.1 mmol/L (ref 3.5–5.3)
Sodium: 137 mmol/L (ref 135–146)
Total Protein: 7.2 g/dL (ref 6.1–8.1)

## 2017-05-08 LAB — LIPASE: LIPASE: 19 U/L (ref 7–60)

## 2017-05-09 NOTE — Progress Notes (Signed)
Labs look great - white blood cell count has normalized - liver enzymes are normal Still recommend abdominal ultrasound and follow-up in 4 weeks

## 2017-05-13 ENCOUNTER — Other Ambulatory Visit: Payer: Commercial Managed Care - PPO

## 2017-05-13 ENCOUNTER — Telehealth: Payer: Self-pay | Admitting: *Deleted

## 2017-05-13 NOTE — Telephone Encounter (Signed)
closed

## 2017-05-27 ENCOUNTER — Other Ambulatory Visit: Payer: Commercial Managed Care - PPO

## 2017-05-30 ENCOUNTER — Telehealth: Payer: Self-pay

## 2017-05-30 NOTE — Telephone Encounter (Signed)
Left a message advising patient to pick up OTC Nexium.

## 2017-05-30 NOTE — Telephone Encounter (Signed)
Nexium PA denied. Left message for patient to call back with an update on how he is doing.

## 2017-05-30 NOTE — Telephone Encounter (Signed)
He can do over-the-counter

## 2017-06-04 ENCOUNTER — Ambulatory Visit: Payer: Commercial Managed Care - PPO | Admitting: Physician Assistant

## 2017-06-29 ENCOUNTER — Other Ambulatory Visit: Payer: Self-pay | Admitting: Physician Assistant

## 2017-06-29 DIAGNOSIS — F17200 Nicotine dependence, unspecified, uncomplicated: Secondary | ICD-10-CM

## 2017-06-29 DIAGNOSIS — Z716 Tobacco abuse counseling: Secondary | ICD-10-CM

## 2017-11-28 ENCOUNTER — Other Ambulatory Visit: Payer: Self-pay

## 2017-11-28 ENCOUNTER — Emergency Department
Admission: EM | Admit: 2017-11-28 | Discharge: 2017-11-28 | Disposition: A | Discharge status: Home / Self Care | Payer: Commercial Managed Care - PPO | Source: Home / Self Care

## 2017-11-28 DIAGNOSIS — S30861A Insect bite (nonvenomous) of abdominal wall, initial encounter: Secondary | ICD-10-CM | POA: Diagnosis not present

## 2017-11-28 DIAGNOSIS — W57XXXA Bitten or stung by nonvenomous insect and other nonvenomous arthropods, initial encounter: Secondary | ICD-10-CM | POA: Diagnosis not present

## 2017-11-28 MED ORDER — DOXYCYCLINE HYCLATE 100 MG PO CAPS: 100.0000 mg | ORAL_CAPSULE | Freq: Two times a day (BID) | ORAL | 0 refills | Status: DC

## 2017-11-28 NOTE — Discharge Instructions (Signed)
Recheck in 1 week

## 2017-11-28 NOTE — ED Triage Notes (Signed)
Pt c/o redness and hot to touch area in right underarm where he removed a tick on Monday or Tuesday night. No other associated symptoms.

## 2017-12-01 NOTE — ED Provider Notes (Signed)
[facility]    CSN: [id number] Arrival date & time: 11/28/17  1844     History   Chief Complaint Chief Complaint  Patient presents with  . Insect Bite    Tick - right underarm    HPI Brandon Phillips is a 52 y.o. male.   Pt complains of redness and swelling at an area under his arm where he was bitten by a tick,  Pt reports he had a red streak,    The history is provided by the patient. No language interpreter was used.    Past Medical History:  Diagnosis Date  . CAP (community acquired pneumonia)   . Eczema   . Tobacco use disorder 01/24/2017    Patient Active Problem List   Diagnosis Date Noted  . Postprandial RUQ pain 05/07/2017  . Hypertension goal BP (blood pressure) < 130/80 05/07/2017  . Electronic cigarette use 04/03/2017  . Chronic obstructive pulmonary disease ([place]) 01/31/2017  . Tobacco use disorder 01/24/2017  . Dyspnea on exertion 01/24/2017  . Cough with sputum 08/06/2009    Past Surgical History:  Procedure Laterality Date  . HERNIA REPAIR     as an infant       Home Medications    Prior to Admission medications   Medication Sig Start Date End Date Taking? Authorizing Provider  BABY ASPIRIN PO Take by mouth.    [provider]  CHANTIX 1 MG tablet TAKE 1 TABLET BY MOUTH TWICE DAILY 06/30/17   Trixie Dredge, [name]  doxycycline (VIBRAMYCIN) 100 MG capsule Take 1 capsule (100 mg total) by mouth 2 (two) times daily. 11/28/17   Fransico Meadow, [name]  esomeprazole (NEXIUM) 10 MG packet Take 10 mg by mouth at bedtime. 05/07/17   Trixie Dredge, [name]  tiotropium (SPIRIVA) 18 MCG inhalation capsule Place 1 capsule (18 mcg total) into inhaler and inhale daily. 01/31/17   Trixie Dredge, [name]    Family History History reviewed. No pertinent family history.  Social History Social History   Tobacco Use  . Smoking status: Former Smoker    Packs/day: 1.50    Years: 35.00    Pack years: 52.50   Types: Cigarettes    Last attempt to quit: 03/30/2017    Years since quitting: 0.6  . Smokeless tobacco: Never Used  Substance Use Topics  . Alcohol use: No  . Drug use: No     Allergies   Patient has no known allergies.   Review of Systems Review of Systems  Skin: Positive for color change and wound.  All other systems reviewed and are negative.    Physical Exam Triage Vital Signs ED Triage Vitals [11/28/17 1900]  Enc Vitals Group     BP 122/84     Pulse Rate (!) 56     Resp 18     Temp 97.7 F (36.5 C)     Temp Source Oral     SpO2 96 %     Weight 139 lb (63 kg)     Height 5\' 5"  (1.651 m)     Head Circumference      Peak Flow      Pain Score 0     Pain Loc      Pain Edu?      Excl. in [place]?    No data found.  Updated Vital Signs BP 122/84 (BP Location: Right Arm)   Pulse (!) 56   Temp 97.7 F (36.5 C) (Oral)   Resp  18   Ht 5\' 5"  (1.651 m)   Wt 139 lb (63 kg)   SpO2 96%   BMI 23.13 kg/m   Visual Acuity Right Eye Distance:   Left Eye Distance:   Bilateral Distance:    Right Eye Near:   Left Eye Near:    Bilateral Near:     Physical Exam  Constitutional: He appears well-developed and well-nourished.  HENT:  Head: Normocephalic.  Musculoskeletal: He exhibits tenderness.  3cm red raised area. Under arm.    Neurological: He is alert.  Skin: Skin is warm.  Psychiatric: He has a normal mood and affect.  Nursing note and vitals reviewed.    UC Treatments / Results  Labs (all labs ordered are listed, but only abnormal results are displayed) Labs Reviewed - No data to display  EKG None  Radiology No results found.  Procedures Procedures (including critical care time)  Medications Ordered in UC Medications - No data to display  Initial Impression / Assessment and Plan / UC Course  I have reviewed the triage vital signs and the nursing notes.  Pertinent labs & imaging results that were available during my care of the patient were  reviewed by me and considered in my medical decision making (see chart for details).     MDM   I will cover with doxy for skin infection.   Pt counseled on symtptoms of tick illness Final Clinical Impressions(s) / UC Diagnoses   Final diagnoses:  Tick bite, initial encounter     Discharge Instructions     Recheck in 1 week    ED Prescriptions    Medication Sig Dispense Auth. Provider   doxycycline (VIBRAMYCIN) 100 MG capsule Take 1 capsule (100 mg total) by mouth 2 (two) times daily. 20 capsule Fransico Meadow, [place]     Controlled Substance Prescriptions  Controlled Substance Registry consulted? Not Applicable  An After Visit Summary was printed and given to the patient.   Fransico Meadow, [place] 12/01/17 [phone]

## 2018-01-15 ENCOUNTER — Ambulatory Visit: Payer: Commercial Managed Care - PPO | Admitting: Physician Assistant

## 2018-01-15 ENCOUNTER — Encounter: Payer: Self-pay | Admitting: Physician Assistant

## 2018-01-15 VITALS — BP 147/90 | HR 64 | Resp 14 | Wt 144.0 lb

## 2018-01-15 DIAGNOSIS — Z87891 Personal history of nicotine dependence: Secondary | ICD-10-CM

## 2018-01-15 DIAGNOSIS — F17290 Nicotine dependence, other tobacco product, uncomplicated: Secondary | ICD-10-CM | POA: Diagnosis not present

## 2018-01-15 DIAGNOSIS — I10 Essential (primary) hypertension: Secondary | ICD-10-CM | POA: Diagnosis not present

## 2018-01-15 DIAGNOSIS — J302 Other seasonal allergic rhinitis: Secondary | ICD-10-CM | POA: Insufficient documentation

## 2018-01-15 DIAGNOSIS — J449 Chronic obstructive pulmonary disease, unspecified: Secondary | ICD-10-CM

## 2018-01-15 DIAGNOSIS — Z23 Encounter for immunization: Secondary | ICD-10-CM

## 2018-01-15 DIAGNOSIS — F17200 Nicotine dependence, unspecified, uncomplicated: Secondary | ICD-10-CM | POA: Insufficient documentation

## 2018-01-15 MED ORDER — FLUTICASONE PROPIONATE 50 MCG/ACT NA SUSP: 1.0000 | Freq: Every evening | NASAL | 3 refills | Status: DC

## 2018-01-15 MED ORDER — FEXOFENADINE HCL 180 MG PO TABS: 180.0000 mg | ORAL_TABLET | Freq: Every day | ORAL | 3 refills | Status: DC

## 2018-01-15 MED ORDER — BUDESONIDE-FORMOTEROL FUMARATE 80-4.5 MCG/ACT IN AERO: 2.0000 | INHALATION_SPRAY | Freq: Two times a day (BID) | RESPIRATORY_TRACT | 0 refills | Status: DC

## 2018-01-15 NOTE — Progress Notes (Signed)
HPI:                                                                Brandon Phillips is a 52 y.o. male who presents to [facility] today for COPD follow-up  COPD: patient with 49 packyear smoking hx with chronic cough and lung hyperinflation on CXR. No hx of spirometry/PFT's. Quit smoking 9 months ago, currently vaping. Hx of pneumonia last March. Last exacerbation June 2018. Stopped his Spiriva about 2 weeks ago due to cost. Reports change in insurance coverage and needs an alternative medication. Endorses nasal congestion and post-nasal drainage. Feels like PND is causing cough.  CAT Score 01/15/2018  Total CAT Score 8   HTN: declines medication management. Does not check BP's at home. Denies vision change, headache, chest pain with exertion, orthopnea, lightheadedness, syncope and edema. Risk factors include: tobacco use, male sex  Upcoming trip to [place] for work July 8. Will be staying for 1 week, staying in a hotel in the city.    Depression screen [facility] 2/9 01/15/2018 04/03/2017  Decreased Interest 0 0  Down, Depressed, Hopeless 0 0  PHQ - 2 Score 0 0    No flowsheet data found.    Past Medical History:  Diagnosis Date  . CAP (community acquired pneumonia)   . Eczema   . Tobacco use disorder 01/24/2017   Past Surgical History:  Procedure Laterality Date  . HERNIA REPAIR     as an infant   Social History   Tobacco Use  . Smoking status: Former Smoker    Packs/day: 1.50    Years: 35.00    Pack years: 52.50    Types: Cigarettes    Last attempt to quit: 03/30/2017    Years since quitting: 0.8  . Smokeless tobacco: Never Used  Substance Use Topics  . Alcohol use: No   family history is not on file.    ROS: negative except as noted in the HPI  Medications: Current Outpatient Medications  Medication Sig Dispense Refill  . BABY ASPIRIN PO Take by mouth.    . budesonide-formoterol (SYMBICORT) 80-4.5 MCG/ACT inhaler  Inhale 2 puffs into the lungs 2 (two) times daily. 2 Inhaler 0  . fexofenadine (ALLEGRA) 180 MG tablet Take 1 tablet (180 mg total) by mouth at bedtime. 90 tablet 3  . fluticasone (FLONASE) 50 MCG/ACT nasal spray Place 1 spray into both nostrils Nightly. 16 g 3   No current facility-administered medications for this visit.    No Known Allergies     Objective:  BP (!) 147/90   Pulse 64   Resp 14   Wt 144 lb (65.3 kg)   SpO2 95%   BMI 23.96 kg/m  Gen:  alert, not ill-appearing, no distress, appropriate for age 38: head normocephalic without obvious abnormality, TM's pearly gray and semi-transparent, conjunctiva and cornea clear, nasal mucosa edematous, oropharynx clear, no cervical adenopathy, trachea midline Pulm: Normal work of breathing, normal phonation, clear to auscultation bilaterally, no wheezes, rales or rhonchi CV: Normal rate, regular rhythm, s1 and s2 distinct, no murmurs, clicks or rubs  Neuro: alert and oriented x 3, no tremor MSK: extremities atraumatic, normal gait and station Skin: intact, no rashes on exposed skin, no jaundice, no  cyanosis Psych: well-groomed, cooperative, good eye contact, euthymic mood, affect mood-congruent, speech is articulate, and thought processes clear and goal-directed    No results found for this or any previous visit (from the past 72 hour(s)). No results found.    Assessment and Plan: 52 y.o. male with   Chronic obstructive pulmonary disease, unspecified COPD type ([place]) - Plan: budesonide-formoterol (SYMBICORT) 80-4.5 MCG/ACT inhaler, Pneumococcal polysaccharide vaccine 23-valent greater than or equal to 2yo subcutaneous/IM  Other tobacco product nicotine dependence, uncomplicated  Former heavy cigarette smoker (20-39 per day)  Seasonal allergic rhinitis, unspecified trigger - Plan: fexofenadine (ALLEGRA) 180 MG tablet, fluticasone (FLONASE) 50 MCG/ACT nasal spray  Hypertension goal BP (blood pressure) < 130/80  Need for  Tdap vaccination - Plan: Tdap vaccine greater than or equal to 7yo IM  COPD - SpO2 95% on RA at rest, no adventitious lung sounds - CAT score 8 - switching to Symbicort, 2 puffs bid - Pneumovax and Tdap updated for upcoming travel  HTN BP Readings from Last 3 Encounters:  01/15/18 (!) 147/90  11/28/17 122/84  05/07/17 129/88  - BP out of range in office today. Declines medication management - continue therapeutic lifestyle changes  Patient education and anticipatory guidance given Patient agrees with treatment plan Follow-up in 6 months or sooner as needed if symptoms worsen or fail to improve  Darlyne Russian [name]

## 2018-01-15 NOTE — Patient Instructions (Signed)

## 2020-02-08 ENCOUNTER — Ambulatory Visit (INDEPENDENT_AMBULATORY_CARE_PROVIDER_SITE_OTHER): Payer: Commercial Managed Care - PPO | Admitting: Medical-Surgical

## 2020-02-08 ENCOUNTER — Encounter: Payer: Self-pay | Admitting: Medical-Surgical

## 2020-02-08 ENCOUNTER — Other Ambulatory Visit: Payer: Self-pay

## 2020-02-08 VITALS — BP 115/72 | HR 71 | Temp 98.0°F | Ht 65.0 in | Wt 143.0 lb

## 2020-02-08 DIAGNOSIS — Z0000 Encounter for general adult medical examination without abnormal findings: Secondary | ICD-10-CM | POA: Diagnosis not present

## 2020-02-08 DIAGNOSIS — J449 Chronic obstructive pulmonary disease, unspecified: Secondary | ICD-10-CM

## 2020-02-08 DIAGNOSIS — Z114 Encounter for screening for human immunodeficiency virus [HIV]: Secondary | ICD-10-CM | POA: Diagnosis not present

## 2020-02-08 DIAGNOSIS — Z1159 Encounter for screening for other viral diseases: Secondary | ICD-10-CM

## 2020-02-08 DIAGNOSIS — Z1211 Encounter for screening for malignant neoplasm of colon: Secondary | ICD-10-CM

## 2020-02-08 DIAGNOSIS — Z125 Encounter for screening for malignant neoplasm of prostate: Secondary | ICD-10-CM

## 2020-02-08 DIAGNOSIS — Z7689 Persons encountering health services in other specified circumstances: Secondary | ICD-10-CM

## 2020-02-08 MED ORDER — BUDESONIDE-FORMOTEROL FUMARATE 80-4.5 MCG/ACT IN AERO: 2.0000 | INHALATION_SPRAY | Freq: Two times a day (BID) | RESPIRATORY_TRACT | 0 refills | Status: DC

## 2020-02-08 NOTE — Patient Instructions (Signed)
Preventive Care 40-53 Years Old, Male °Preventive care refers to lifestyle choices and visits with your health care provider that can promote health and wellness. This includes: °· A yearly physical exam. This is also called an annual well check. °· Regular dental and eye exams. °· Immunizations. °· Screening for certain conditions. °· Healthy lifestyle choices, such as eating a healthy diet, getting regular exercise, not using drugs or products that contain nicotine and tobacco, and limiting alcohol use. °What can I expect for my preventive care visit? °Physical exam °Your health care provider will check: °· Height and weight. These may be used to calculate body mass index (BMI), which is a measurement that tells if you are at a healthy weight. °· Heart rate and blood pressure. °· Your skin for abnormal spots. °Counseling °Your health care provider may ask you questions about: °· Alcohol, tobacco, and drug use. °· Emotional well-being. °· Home and relationship well-being. °· Sexual activity. °· Eating habits. °· Work and work environment. °What immunizations do I need? ° °Influenza (flu) vaccine °· This is recommended every year. °Tetanus, diphtheria, and pertussis (Tdap) vaccine °· You may need a Td booster every 10 years. °Varicella (chickenpox) vaccine °· You may need this vaccine if you have not already been vaccinated. °Zoster (shingles) vaccine °· You may need this after age 60. °Measles, mumps, and rubella (MMR) vaccine °· You may need at least one dose of MMR if you were born in 1957 or later. You may also need a second dose. °Pneumococcal conjugate (PCV13) vaccine °· You may need this if you have certain conditions and were not previously vaccinated. °Pneumococcal polysaccharide (PPSV23) vaccine °· You may need one or two doses if you smoke cigarettes or if you have certain conditions. °Meningococcal conjugate (MenACWY) vaccine °· You may need this if you have certain conditions. °Hepatitis A  vaccine °· You may need this if you have certain conditions or if you travel or work in places where you may be exposed to hepatitis A. °Hepatitis B vaccine °· You may need this if you have certain conditions or if you travel or work in places where you may be exposed to hepatitis B. °Haemophilus influenzae type b (Hib) vaccine °· You may need this if you have certain risk factors. °Human papillomavirus (HPV) vaccine °· If recommended by your health care provider, you may need three doses over 6 months. °You may receive vaccines as individual doses or as more than one vaccine together in one shot (combination vaccines). Talk with your health care provider about the risks and benefits of combination vaccines. °What tests do I need? °Blood tests °· Lipid and cholesterol levels. These may be checked every 5 years, or more frequently if you are over 50 years old. °· Hepatitis C test. °· Hepatitis B test. °Screening °· Lung cancer screening. You may have this screening every year starting at age 55 if you have a 30-pack-year history of smoking and currently smoke or have quit within the past 15 years. °· Prostate cancer screening. Recommendations will vary depending on your family history and other risks. °· Colorectal cancer screening. All adults should have this screening starting at age 50 and continuing until age 75. Your health care provider may recommend screening at age 45 if you are at increased risk. You will have tests every 1-10 years, depending on your results and the type of screening test. °· Diabetes screening. This is done by checking your blood sugar (glucose) after you have not eaten   for a while (fasting). You may have this done every 1-3 years.  Sexually transmitted disease (STD) testing. Follow these instructions at home: Eating and drinking  Eat a diet that includes fresh fruits and vegetables, whole grains, lean protein, and low-fat dairy products.  Take vitamin and mineral supplements as  recommended by your health care provider.  Do not drink alcohol if your health care provider tells you not to drink.  If you drink alcohol: ? Limit how much you have to 0-2 drinks a day. ? Be aware of how much alcohol is in your drink. In the [place] one drink equals one 12 oz bottle of beer (355 mL), one 5 oz glass of wine (148 mL), or one 1 oz glass of hard liquor (44 mL). Lifestyle  Take daily care of your teeth and gums.  Stay active. Exercise for at least 30 minutes on 5 or more days each week.  Do not use any products that contain nicotine or tobacco, such as cigarettes, e-cigarettes, and chewing tobacco. If you need help quitting, ask your health care provider.  If you are sexually active, practice safe sex. Use a condom or other form of protection to prevent STIs (sexually transmitted infections).  Talk with your health care provider about taking a low-dose aspirin every day starting at age 50. What's next?  Go to your health care provider once a year for a well check visit.  Ask your health care provider how often you should have your eyes and teeth checked.  Stay up to date on all vaccines. This information is not intended to replace advice given to you by your health care provider. Make sure you discuss any questions you have with your health care provider. Document Revised: 07/09/2018 Document Reviewed: 07/09/2018 Elsevier Patient Education  2020 Elsevier Inc.  

## 2020-02-08 NOTE — Progress Notes (Signed)
HPI: Brandon Phillips is a 54 y.o. male who  has a past medical history of CAP (community acquired pneumonia), Eczema, and Tobacco use disorder (01/24/2017).  he [facility] to [facility], 02/08/20,  for chief complaint of: Annual physical exam  Dental- every 6 months, no concerns Eye- no eye exam, reading glasses Diet- junk food, occasional salad, no special diet; drinks tea and coffee; occasional beer Exercise- active at work, no other intentional exercise  COPD- SOB with exertion, occasional wheezing, coughing is nonproductive; not using Symbicort, reports he never got a prescription; has an Albuterol inhaler, not helping much, uses around once a month.  Continues to vape regularly.  Reports that he never had spirometry testing to evaluate lung function.  Past medical, surgical, social and family history reviewed:  Patient Active Problem List   Diagnosis Date Noted  . Seasonal allergic rhinitis 01/15/2018  . Former heavy cigarette smoker (20-39 per day) 01/15/2018  . Nicotine dependence 01/15/2018  . Postprandial RUQ pain 05/07/2017  . Hypertension goal BP (blood pressure) < 130/80 05/07/2017  . Electronic cigarette use 04/03/2017  . Chronic obstructive pulmonary disease ([place]) 01/31/2017  . Tobacco use disorder 01/24/2017  . Dyspnea on exertion 01/24/2017  . Cough with sputum 08/06/2009    Past Surgical History:  Procedure Laterality Date  . HERNIA REPAIR     as an infant    Social History   Tobacco Use  . Smoking status: Former Smoker    Packs/day: 1.50    Years: 35.00    Pack years: 52.50    Types: Cigarettes    Quit date: 03/30/2017    Years since quitting: 2.8  . Smokeless tobacco: Never Used  Substance Use Topics  . Alcohol use: No    No family history on file.   Current medication list and allergy/intolerance information reviewed:    Current Outpatient Medications  Medication Sig Dispense Refill  . BABY ASPIRIN PO  Take by mouth.    . budesonide-formoterol (SYMBICORT) 80-4.5 MCG/ACT inhaler Inhale 2 puffs into the lungs 2 (two) times daily. 2 Inhaler 0  . fexofenadine (ALLEGRA) 180 MG tablet Take 1 tablet (180 mg total) by mouth at bedtime. 90 tablet 3  . fluticasone (FLONASE) 50 MCG/ACT nasal spray Place 1 spray into both nostrils Nightly. 16 g 3   No current facility-administered medications for this visit.    No Known Allergies    Review of Systems:  Constitutional:  No  fever, no chills, No recent illness, No unintentional weight changes. No significant fatigue.   HEENT: No  headache, no vision change, no hearing change, No sore throat, No  sinus pressure  Cardiac: No chest pain, No  pressure, No palpitations, No  Orthopnea  Respiratory:  +  shortness of breath. + Cough  Gastrointestinal: No  abdominal pain, No  nausea, No  vomiting,  No  blood in stool, No  diarrhea, No  constipation   Musculoskeletal: No new myalgia/arthralgia  Skin: + Rash, No other wounds/concerning lesions  Genitourinary: No  incontinence, No  abnormal genital bleeding, No abnormal genital discharge  Hem/Onc: No  easy bruising/bleeding, No  abnormal lymph node  Endocrine: No cold intolerance,  No heat intolerance. No polyuria/polydipsia/polyphagia   Neurologic: No  weakness, No  dizziness, No  slurred speech/focal weakness/facial droop  Psychiatric: No  concerns with depression, No  concerns with anxiety, No sleep problems, No mood problems  Exam:  BP 115/72   Pulse 71   Temp 98  F (36.7 C)   Ht 5' 5"  (1.651 m)   Wt 143 lb (64.9 kg)   SpO2 95%   BMI 23.80 kg/m   Constitutional: VS see above. General Appearance: alert, well-developed, well-nourished, NAD  Eyes: Normal lids and conjunctive, non-icteric sclera  Ears, Nose, Mouth, Throat: MMM, Normal external inspection ears/nares/mouth/lips/gums. TM normal bilaterally. Pharynx/tonsils no erythema, no exudate. Nasal mucosa normal.   Neck: No masses,  trachea midline. No thyroid enlargement. No tenderness/mass appreciated. No lymphadenopathy  Respiratory: Normal respiratory effort. no wheeze, no rhonchi, no rales  Cardiovascular: S1/S2 normal, no murmur, no rub/gallop auscultated. RRR. No lower extremity edema. Pedal pulse II/IV bilaterally DP and PT. No carotid bruit or JVD. No abdominal aortic bruit.  Gastrointestinal: Nontender, no masses. No hepatomegaly, no splenomegaly. No hernia appreciated. Bowel sounds normal. Rectal exam deferred.   Musculoskeletal: Gait normal. No clubbing/cyanosis of digits.   Neurological: Normal balance/coordination. No tremor. No cranial nerve deficit on limited exam. Motor and sensation intact and symmetric. Cerebellar reflexes intact.   Skin: warm, dry, intact. No rash/ulcer. No concerning nevi or subq nodules on limited exam.    Psychiatric: Normal judgment/insight. Normal mood and affect. Oriented x3.    No results found for this or any previous visit (from the past 72 hour(s)).  No results found.   ASSESSMENT/PLAN:   1. Annual physical exam Checking CBC, CMP, and lipid panel today. - CBC - COMPLETE METABOLIC PANEL WITH GFR - Lipid panel  2. Chronic obstructive pulmonary disease, unspecified COPD type ([place]) Discussed COPD and its relation to smoking/vaping.  Encouraged vaping cessation.  Recommend starting Symbicort inhaler 2 puffs twice daily to see if this helps with his symptoms.  May continue to use albuterol inhaler as needed.  No spirometry in the past, with worsening symptoms would benefit from completing PFTs. - budesonide-formoterol (SYMBICORT) 80-4.5 MCG/ACT inhaler; Inhale 2 puffs into the lungs 2 (two) times daily.  Dispense: 2 Inhaler; Refill: 0  3. Encounter to establish care Reviewed available records and discussed health concerns.  4. Encounter for screening for HIV Discussed screening recommendations.  Patient agreeable to screening so we will add this to blood work  today. - HIV Antibody (routine testing w rflx)  5. Need for hepatitis C screening test Discussed screening recommendations.  Patient agreeable to screening so we will add this to blood work today. - Hepatitis C Antibody  6. Colon cancer screening Due for colonoscopy/colon cancer screening.  Discussed options, patient agreeable to do Cologuard.  Advised patient that we will order this today and the kit will be mailed to him with instructions on completion. - Cologuard  7. Prostate cancer screening Discussed risks and benefits regarding prostate cancer screening.  Patient agreeable to add PSA to blood work today. - PSA   Orders Placed This Encounter  Procedures  . CBC  . COMPLETE METABOLIC PANEL WITH GFR  . Lipid panel  . PSA  . Cologuard  . HIV Antibody (routine testing w rflx)  . Hepatitis C Antibody    Meds ordered this encounter  Medications  . budesonide-formoterol (SYMBICORT) 80-4.5 MCG/ACT inhaler    Sig: Inhale 2 puffs into the lungs 2 (two) times daily.    Dispense:  2 Inhaler    Refill:  0    Order Specific Question:   Supervising Provider    Answer:   Emeterio Reeve [[id number]]    Patient Instructions  Preventive Care 32-87 Years Old, Male Preventive care refers to lifestyle choices and visits with your health  care provider that can promote health and wellness. This includes:  A yearly physical exam. This is also called an annual well check.  Regular dental and eye exams.  Immunizations.  Screening for certain conditions.  Healthy lifestyle choices, such as eating a healthy diet, getting regular exercise, not using drugs or products that contain nicotine and tobacco, and limiting alcohol use. What can I expect for my preventive care visit? Physical exam Your health care provider will check:  Height and weight. These may be used to calculate body mass index (BMI), which is a measurement that tells if you are at a healthy weight.  Heart rate and blood  pressure.  Your skin for abnormal spots. Counseling Your health care provider may ask you questions about:  Alcohol, tobacco, and drug use.  Emotional well-being.  Home and relationship well-being.  Sexual activity.  Eating habits.  Work and work Statistician. What immunizations do I need?  Influenza (flu) vaccine  This is recommended every year. Tetanus, diphtheria, and pertussis (Tdap) vaccine  You may need a Td booster every 10 years. Varicella (chickenpox) vaccine  You may need this vaccine if you have not already been vaccinated. Zoster (shingles) vaccine  You may need this after age 44. Measles, mumps, and rubella (MMR) vaccine  You may need at least one dose of MMR if you were born in 1957 or later. You may also need a second dose. Pneumococcal conjugate (PCV13) vaccine  You may need this if you have certain conditions and were not previously vaccinated. Pneumococcal polysaccharide (PPSV23) vaccine  You may need one or two doses if you smoke cigarettes or if you have certain conditions. Meningococcal conjugate (MenACWY) vaccine  You may need this if you have certain conditions. Hepatitis A vaccine  You may need this if you have certain conditions or if you travel or work in places where you may be exposed to hepatitis A. Hepatitis B vaccine  You may need this if you have certain conditions or if you travel or work in places where you may be exposed to hepatitis B. Haemophilus influenzae type b (Hib) vaccine  You may need this if you have certain risk factors. Human papillomavirus (HPV) vaccine  If recommended by your health care provider, you may need three doses over 6 months. You may receive vaccines as individual doses or as more than one vaccine together in one shot (combination vaccines). Talk with your health care provider about the risks and benefits of combination vaccines. What tests do I need? Blood tests  Lipid and cholesterol levels. These  may be checked every 5 years, or more frequently if you are over 40 years old.  Hepatitis C test.  Hepatitis B test. Screening  Lung cancer screening. You may have this screening every year starting at age 54 if you have a 30-pack-year history of smoking and currently smoke or have quit within the past 15 years.  Prostate cancer screening. Recommendations will vary depending on your family history and other risks.  Colorectal cancer screening. All adults should have this screening starting at age 74 and continuing until age 81. Your health care provider may recommend screening at age 65 if you are at increased risk. You will have tests every 1-10 years, depending on your results and the type of screening test.  Diabetes screening. This is done by checking your blood sugar (glucose) after you have not eaten for a while (fasting). You may have this done every 1-3 years.  Sexually transmitted disease (  STD) testing. Follow these instructions at home: Eating and drinking  Eat a diet that includes fresh fruits and vegetables, whole grains, lean protein, and low-fat dairy products.  Take vitamin and mineral supplements as recommended by your health care provider.  Do not drink alcohol if your health care provider tells you not to drink.  If you drink alcohol: ? Limit how much you have to 0-2 drinks a day. ? Be aware of how much alcohol is in your drink. In the [place] one drink equals one 12 oz bottle of beer (355 mL), one 5 oz glass of wine (148 mL), or one 1 oz glass of hard liquor (44 mL). Lifestyle  Take daily care of your teeth and gums.  Stay active. Exercise for at least 30 minutes on 5 or more days each week.  Do not use any products that contain nicotine or tobacco, such as cigarettes, e-cigarettes, and chewing tobacco. If you need help quitting, ask your health care provider.  If you are sexually active, practice safe sex. Use a condom or other form of protection to prevent STIs  (sexually transmitted infections).  Talk with your health care provider about taking a low-dose aspirin every day starting at age 31. What's next?  Go to your health care provider once a year for a well check visit.  Ask your health care provider how often you should have your eyes and teeth checked.  Stay up to date on all vaccines. This information is not intended to replace advice given to you by your health care provider. Make sure you discuss any questions you have with your health care provider. Document Revised: 07/09/2018 Document Reviewed: 07/09/2018 Elsevier Patient Education  [place].    Follow-up plan: Return in about 1 year (around 02/07/2021) for annual physical exam; at your convenience for spirometry.  Clearnce Sorrel, DNP, APRN, FNP-BC [facility]

## 2020-02-10 ENCOUNTER — Telehealth: Payer: Self-pay

## 2020-02-10 NOTE — Telephone Encounter (Signed)
Noted. Task completed by provider.

## 2020-02-10 NOTE — Telephone Encounter (Signed)
No samples were given in office.  This was a refill that should have been sent to the pharmacy.  I did get an error in E prescribing with that prescription but I sent it again and it said it went through.  He will need this prescription so that he can get started on his Symbicort.

## 2020-02-10 NOTE — Telephone Encounter (Signed)
[facility] pharmacy called stating that Symbicort rx was not received by the pharmacy. Was pt given office samples for Symbicort? Pls advise, thanks.

## 2020-02-11 ENCOUNTER — Other Ambulatory Visit: Payer: Self-pay | Admitting: Medical-Surgical

## 2020-02-11 DIAGNOSIS — J449 Chronic obstructive pulmonary disease, unspecified: Secondary | ICD-10-CM

## 2020-02-11 LAB — CBC
HCT: 48.8 % (ref 38.5–50.0)
Hemoglobin: 16.5 g/dL (ref 13.2–17.1)
MCH: 29.1 pg (ref 27.0–33.0)
MCHC: 33.8 g/dL (ref 32.0–36.0)
MCV: 86.1 fL (ref 80.0–100.0)
MPV: 10.5 fL (ref 7.5–12.5)
Platelets: 253 10*3/uL (ref 140–400)
RBC: 5.67 10*6/uL (ref 4.20–5.80)
RDW: 12.9 % (ref 11.0–15.0)
WBC: 7.9 10*3/uL (ref 3.8–10.8)

## 2020-02-11 LAB — COMPLETE METABOLIC PANEL WITH GFR
AG Ratio: 1.7 (calc) (ref 1.0–2.5)
ALT: 16 U/L (ref 9–46)
AST: 15 U/L (ref 10–35)
Albumin: 4.6 g/dL (ref 3.6–5.1)
Alkaline phosphatase (APISO): 47 U/L (ref 35–144)
BUN: 17 mg/dL (ref 7–25)
CO2: 29 mmol/L (ref 20–32)
Calcium: 9.8 mg/dL (ref 8.6–10.3)
Chloride: 99 mmol/L (ref 98–110)
Creat: 1.06 mg/dL (ref 0.70–1.33)
GFR, Est African American: 93 mL/min/{1.73_m2} (ref >=60)
GFR, Est Non African American: 80 mL/min/{1.73_m2} (ref >=60)
Globulin: 2.7 g/dL (calc) (ref 1.9–3.7)
Glucose, Bld: 106 mg/dL — ABNORMAL HIGH (ref 65–99)
Potassium: 5.1 mmol/L (ref 3.5–5.3)
Sodium: 140 mmol/L (ref 135–146)
Total Bilirubin: 0.4 mg/dL (ref 0.2–1.2)
Total Protein: 7.3 g/dL (ref 6.1–8.1)

## 2020-02-11 LAB — HEPATITIS C ANTIBODY
Hepatitis C Ab: NONREACTIVE
SIGNAL TO CUT-OFF: 0.01 (ref <1.00)

## 2020-02-11 LAB — PSA: PSA: 1 ng/mL (ref <=4.0)

## 2020-02-11 LAB — LIPID PANEL
Cholesterol: 275 mg/dL — ABNORMAL HIGH (ref <200)
HDL: 50 mg/dL (ref >=40)
LDL Cholesterol (Calc): 194 mg/dL (calc) — ABNORMAL HIGH
Non-HDL Cholesterol (Calc): 225 mg/dL (calc) — ABNORMAL HIGH (ref <130)
Total CHOL/HDL Ratio: 5.5 (calc) — ABNORMAL HIGH (ref <5.0)
Triglycerides: 148 mg/dL (ref <150)

## 2020-02-11 LAB — HIV ANTIBODY (ROUTINE TESTING W REFLEX): HIV 1&2 Ab, 4th Generation: NONREACTIVE

## 2020-02-11 MED ORDER — BUDESONIDE-FORMOTEROL FUMARATE 160-4.5 MCG/ACT IN AERO: 2.0000 | INHALATION_SPRAY | Freq: Two times a day (BID) | RESPIRATORY_TRACT | 0 refills | Status: DC

## 2020-02-15 ENCOUNTER — Encounter: Payer: Self-pay | Admitting: Medical-Surgical

## 2020-02-15 ENCOUNTER — Telehealth: Payer: Self-pay

## 2020-02-15 DIAGNOSIS — E1169 Type 2 diabetes mellitus with other specified complication: Secondary | ICD-10-CM

## 2020-02-15 MED ORDER — ATORVASTATIN CALCIUM 20 MG PO TABS: 20.0000 mg | ORAL_TABLET | Freq: Every day | ORAL | 3 refills | Status: DC

## 2020-02-15 NOTE — Telephone Encounter (Signed)
Fax received from pharmacy stating budesonise/form 160/4.5 mcg inhaler (Symbicort) is not covered by his insurance plan.   What is covered is: Fluticasones/salmeterol wixelainhub AdvairHFA Deliah Goody Symbicort  Please advise

## 2020-02-15 NOTE — Addendum Note (Signed)
Addended bySamuel Bouche on: 02/15/2020 02:09 PM   Modules accepted: Orders

## 2020-02-15 NOTE — Telephone Encounter (Signed)
Contacted pharmacy regarding coverage of Symbicort. Patient has already picked up the Symbicort out of pocket. We will touch base with the insurance folks to try to see what will get this covered. If necessary, we may need to switch to another agent.   Barnet Pall, can  you see what we need to do to get the Symbicort covered for this gentleman?

## 2020-02-17 NOTE — Telephone Encounter (Signed)
Patient has a nurse visit for spirometry on 02/25/2020 and then a visit with PCP after. No other concerns.

## 2020-02-17 NOTE — Telephone Encounter (Signed)
I checked with insurance on the web site and the patient will have to try and fail one other other items that is covered before he could potentially get back on Symbicort. Please advise.

## 2020-02-17 NOTE — Telephone Encounter (Signed)
Ok. He has the Symbicort that he paid for out of pocket for now. I will switch to another one of the approved meds for the next month.

## 2020-02-18 MED ORDER — ADVAIR HFA 115-21 MCG/ACT IN AERO: 2.0000 | INHALATION_SPRAY | Freq: Two times a day (BID) | RESPIRATORY_TRACT | 0 refills | Status: DC

## 2020-02-18 NOTE — Addendum Note (Signed)
Addended bySamuel Bouche on: 02/18/2020 01:14 PM   Modules accepted: Orders

## 2020-02-25 ENCOUNTER — Encounter: Payer: Self-pay | Admitting: Medical-Surgical

## 2020-02-25 ENCOUNTER — Other Ambulatory Visit: Payer: Self-pay

## 2020-02-25 ENCOUNTER — Ambulatory Visit (INDEPENDENT_AMBULATORY_CARE_PROVIDER_SITE_OTHER): Payer: Commercial Managed Care - PPO | Admitting: Medical-Surgical

## 2020-02-25 VITALS — BP 126/68 | HR 59 | Ht 65.0 in | Wt 144.0 lb

## 2020-02-25 DIAGNOSIS — J449 Chronic obstructive pulmonary disease, unspecified: Secondary | ICD-10-CM

## 2020-02-25 DIAGNOSIS — R058 Other specified cough: Secondary | ICD-10-CM

## 2020-02-25 DIAGNOSIS — R05 Cough: Secondary | ICD-10-CM

## 2020-02-25 DIAGNOSIS — F17200 Nicotine dependence, unspecified, uncomplicated: Secondary | ICD-10-CM

## 2020-02-25 MED ORDER — ALBUTEROL SULFATE HFA 108 (90 BASE) MCG/ACT IN AERS
4.0000 | INHALATION_SPRAY | Freq: Once | RESPIRATORY_TRACT | Status: AC
  Administered 2020-02-25: 4 via RESPIRATORY_TRACT

## 2020-02-25 MED ORDER — TIOTROPIUM BROMIDE-OLODATEROL 2.5-2.5 MCG/ACT IN AERS
2.0000 | INHALATION_SPRAY | Freq: Every day | RESPIRATORY_TRACT | 5 refills | Status: DC
Start: 2020-02-25 — End: 2020-08-28

## 2020-02-25 NOTE — Progress Notes (Signed)
Subjective:    CC: Spirometry testing  HPI: Pleasant 54 year old male presenting today for spirometry testing.  Per our records, was diagnosed with COPD last year but notes he never had spirometry completed.  He is a former heavy cigarette smoker of 2 packs/day since he was 54 years old.  Has also been working in an environment with lots of particulate matter. Started using Symbicort and has not noticed a big difference. Has never had a rescue inhaler. Endorses SOB with activity, especially at work.  I reviewed the past medical history, family history, social history, surgical history, and allergies today and no changes were needed.  Please see the problem list section below in epic for further details.  Past Medical History: Past Medical History:  Diagnosis Date  . CAP (community acquired pneumonia)   . Eczema   . Tobacco use disorder 01/24/2017   Past Surgical History: Past Surgical History:  Procedure Laterality Date  . HERNIA REPAIR     as an infant   Social History: Social History   Socioeconomic History  . Marital status: Married    Spouse name: Not on file  . Number of children: Not on file  . Years of education: Not on file  . Highest education level: Not on file  Occupational History  . Not on file  Tobacco Use  . Smoking status: Former Smoker    Packs/day: 1.50    Years: 35.00    Pack years: 52.50    Types: Cigarettes    Quit date: 03/30/2017    Years since quitting: 2.9  . Smokeless tobacco: Never Used  Vaping Use  . Vaping Use: Every day  Substance and Sexual Activity  . Alcohol use: No  . Drug use: No  . Sexual activity: Yes  Other Topics Concern  . Not on file  Social History Narrative  . Not on file   Social Determinants of Health   Financial Resource Strain:   . Difficulty of Paying Living Expenses:   Food Insecurity:   . Worried About Charity fundraiser in the Last Year:   . Arboriculturist in the Last Year:   Transportation Needs:   .  Film/video editor (Medical):   Marland Kitchen Lack of Transportation (Non-Medical):   Physical Activity:   . Days of Exercise per Week:   . Minutes of Exercise per Session:   Stress:   . Feeling of Stress :   Social Connections:   . Frequency of Communication with Friends and Family:   . Frequency of Social Gatherings with Friends and Family:   . Attends Religious Services:   . Active Member of Clubs or Organizations:   . Attends Archivist Meetings:   Marland Kitchen Marital Status:    Family History: No family history on file. Allergies: No Known Allergies Medications: See med rec.  Review of Systems: No fevers, chills, night sweats, weight loss, chest pain, or shortness of breath.   Objective:    General: Well Developed, well nourished, and in no acute distress.  Neuro: Alert and oriented x3.  HEENT: Normocephalic, atraumatic.  Skin: Warm and dry. Cardiac: Regular rate and rhythm, no murmurs rubs or gallops, no lower extremity edema.  Respiratory: Clear to auscultation bilaterally. Not using accessory muscles, speaking in full sentences.  Impression and Recommendations:    1. Chronic obstructive pulmonary disease, unspecified COPD type ([place]) Spirometry completed. FEV1/FVC ratio 48.6-53.9% indicating moderate obstruction consistent with GOLD 2. Discussed recommendations for lung cancer screening with  low dose CT. Patient agreeable and will let me know where to send the order depending on insurance coverage. Changing Advair to Stiolto 2 puffs daily. Provided albuterol inhaler for PRN use as a rescue inhaler 2 puffs every 4-6 hours. - albuterol (VENTOLIN HFA) 108 (90 Base) MCG/ACT inhaler 4 puff  Return if symptoms worsen or fail to improve. ___________________________________________ Clearnce Sorrel, DNP, APRN, FNP-BC [facility] [place]

## 2020-02-25 NOTE — Patient Instructions (Signed)
Lung Cancer Screening A lung cancer screening is a test that checks for lung cancer. Lung cancer screening is done to look for lung cancer in its very early stages, before it spreads and becomes harder to treat and before symptoms appear. Finding cancer early improves the chances of successful treatment. It may save your life. Should I be screened for lung cancer? You should be screened for lung cancer if all of these apply:  You currently smoke or you have quit smoking within the past 15 years.  You are 55-74 years old. Screening may be recommended up to age 80 depending on your overall health and other factors.  You are in good general health.  You have a smoking history of 1 pack a day for 30 years or 2 packs a day for 15 years. Screening may also be recommended if you are at high risk for the disease. You may be at high risk if:  You have a family history of lung cancer.  You have been exposed to asbestos.  You have chronic obstructive pulmonary disease (COPD).  You have a history of previous lung cancer. How often should I be screened for lung cancer?  If you are at risk for lung cancer, it is recommended that you are screened once a year. The recommended screening test is a low-dose CT scan. How can I lower my risk of lung cancer? To lower your risk of developing lung cancer:  If you smoke, stop smoking all tobacco products.  Avoid secondhand smoke.  Avoid exposure to radiation.  Avoid exposure to radon gas. Have your home checked for radon regularly.  Avoid things that cause cancer (carcinogens).  Avoid living or working in places with high air pollution. Where to find more information Ask your health care provider about the risks and benefits of screening. More information and resources are available from these organizations:  American Cancer Society (ACS): www.cancer.org  American Lung Association: www.lung.org Contact a health care provider if:  You start to  show symptoms of lung cancer, including: ? Coughing that will not go away. ? Wheezing. ? Chest pain. ? Coughing up blood. ? Shortness of breath. ? Weight loss that cannot be explained. ? Constant fatigue. Summary  Lung cancer screening may find lung cancer before symptoms appear. Finding cancer early improves the chances of successful treatment. It may save your life.  If you are at risk for lung cancer, it is recommended that you are screened once a year. The recommended screening test is a low-dose CT scan.  You can make lifestyle changes to lower your risk of lung cancer.  Ask your health care provider about the risks and benefits of screening. This information is not intended to replace advice given to you by your health care provider. Make sure you discuss any questions you have with your health care provider. Document Revised: 11/06/2018 Document Reviewed: 06/05/2016 Elsevier Patient Education  2020 Elsevier Inc.  

## 2020-02-29 NOTE — Addendum Note (Signed)
Addended by: Narda Rutherford on: 02/29/2020 02:24 PM   Modules accepted: Orders

## 2020-03-01 ENCOUNTER — Other Ambulatory Visit: Payer: Self-pay | Admitting: Medical-Surgical

## 2020-03-01 DIAGNOSIS — J449 Chronic obstructive pulmonary disease, unspecified: Secondary | ICD-10-CM

## 2020-03-01 DIAGNOSIS — R0602 Shortness of breath: Secondary | ICD-10-CM

## 2020-03-01 DIAGNOSIS — F17200 Nicotine dependence, unspecified, uncomplicated: Secondary | ICD-10-CM

## 2020-03-02 ENCOUNTER — Ambulatory Visit (INDEPENDENT_AMBULATORY_CARE_PROVIDER_SITE_OTHER): Payer: Commercial Managed Care - PPO

## 2020-03-02 ENCOUNTER — Other Ambulatory Visit: Payer: Self-pay

## 2020-03-02 DIAGNOSIS — J449 Chronic obstructive pulmonary disease, unspecified: Secondary | ICD-10-CM

## 2020-03-02 DIAGNOSIS — R0602 Shortness of breath: Secondary | ICD-10-CM | POA: Diagnosis not present

## 2020-04-04 ENCOUNTER — Encounter: Payer: Self-pay | Admitting: Medical-Surgical

## 2020-04-05 ENCOUNTER — Encounter: Payer: Self-pay | Admitting: Medical-Surgical

## 2020-04-05 ENCOUNTER — Other Ambulatory Visit: Payer: Self-pay

## 2020-04-05 DIAGNOSIS — E782 Mixed hyperlipidemia: Secondary | ICD-10-CM

## 2020-04-06 LAB — COMPLETE METABOLIC PANEL WITH GFR
AG Ratio: 1.7 (calc) (ref 1.0–2.5)
ALT: 19 U/L (ref 9–46)
AST: 17 U/L (ref 10–35)
Albumin: 4.4 g/dL (ref 3.6–5.1)
Alkaline phosphatase (APISO): 46 U/L (ref 35–144)
BUN: 14 mg/dL (ref 7–25)
CO2: 29 mmol/L (ref 20–32)
Calcium: 9.4 mg/dL (ref 8.6–10.3)
Chloride: 101 mmol/L (ref 98–110)
Creat: 0.98 mg/dL (ref 0.70–1.33)
GFR, Est African American: 102 mL/min/{1.73_m2} (ref >=60)
GFR, Est Non African American: 88 mL/min/{1.73_m2} (ref >=60)
Globulin: 2.6 g/dL (calc) (ref 1.9–3.7)
Glucose, Bld: 107 mg/dL — ABNORMAL HIGH (ref 65–99)
Potassium: 4.1 mmol/L (ref 3.5–5.3)
Sodium: 137 mmol/L (ref 135–146)
Total Bilirubin: 0.6 mg/dL (ref 0.2–1.2)
Total Protein: 7 g/dL (ref 6.1–8.1)

## 2020-04-06 LAB — LIPID PANEL
Cholesterol: 151 mg/dL (ref <200)
HDL: 38 mg/dL — ABNORMAL LOW (ref >=40)
LDL Cholesterol (Calc): 92 mg/dL (calc)
Non-HDL Cholesterol (Calc): 113 mg/dL (calc) (ref <130)
Total CHOL/HDL Ratio: 4 (calc) (ref <5.0)
Triglycerides: 118 mg/dL (ref <150)

## 2020-07-19 ENCOUNTER — Encounter: Payer: Self-pay | Admitting: Medical-Surgical

## 2020-08-28 ENCOUNTER — Other Ambulatory Visit: Payer: Self-pay

## 2020-08-28 ENCOUNTER — Encounter: Payer: Self-pay | Admitting: Medical-Surgical

## 2020-08-28 ENCOUNTER — Ambulatory Visit (INDEPENDENT_AMBULATORY_CARE_PROVIDER_SITE_OTHER): Payer: Commercial Managed Care - PPO | Admitting: Medical-Surgical

## 2020-08-28 VITALS — BP 116/74 | HR 81 | Temp 98.6°F | Ht 65.0 in | Wt 150.4 lb

## 2020-08-28 DIAGNOSIS — J449 Chronic obstructive pulmonary disease, unspecified: Secondary | ICD-10-CM

## 2020-08-28 DIAGNOSIS — Z1211 Encounter for screening for malignant neoplasm of colon: Secondary | ICD-10-CM | POA: Diagnosis not present

## 2020-08-28 DIAGNOSIS — E782 Mixed hyperlipidemia: Secondary | ICD-10-CM

## 2020-08-28 DIAGNOSIS — I10 Essential (primary) hypertension: Secondary | ICD-10-CM | POA: Diagnosis not present

## 2020-08-28 MED ORDER — ALBUTEROL SULFATE HFA 108 (90 BASE) MCG/ACT IN AERS: 2.0000 | INHALATION_SPRAY | Freq: Four times a day (QID) | RESPIRATORY_TRACT | 11 refills | Status: DC | PRN

## 2020-08-28 MED ORDER — ATORVASTATIN CALCIUM 20 MG PO TABS: 20.0000 mg | ORAL_TABLET | Freq: Every day | ORAL | 3 refills | Status: DC

## 2020-08-28 MED ORDER — ADVAIR HFA 115-21 MCG/ACT IN AERO: 2.0000 | INHALATION_SPRAY | Freq: Two times a day (BID) | RESPIRATORY_TRACT | 11 refills | Status: DC

## 2020-08-28 NOTE — Progress Notes (Addendum)
Subjective:    CC: COPD follow up  HPI: Pleasant 55 year old male presenting today for the following:  COPD- currently using tiotropium-olodaterol 2 puffs daily. Unfortunately, this is very expensive and their out of pocket cost is not reasonable. Has a list of cheaper medications that may be a good substitute. Feels that the inhaler has helped his dyspnea and activity tolerance. Uses his albuterol inhaler infrequently depending on the weather and environmental conditions. Continues to vape with nicotine.   HTN- blood pressure at goal without medications.   HLD- taking Lipitor 35m daily, tolerating well without side effects. Needs refills today.   Colon cancer screening- previously ordered Cologuard but reports he never received the kit.   I reviewed the past medical history, family history, social history, surgical history, and allergies today and no changes were needed.  Please see the problem list section below in epic for further details.  Past Medical History: Past Medical History:  Diagnosis Date  . CAP (community acquired pneumonia)   . Eczema   . Tobacco use disorder 01/24/2017   Past Surgical History: Past Surgical History:  Procedure Laterality Date  . HERNIA REPAIR     as an infant   Social History: Social History   Socioeconomic History  . Marital status: Married    Spouse name: Not on file  . Number of children: Not on file  . Years of education: Not on file  . Highest education level: Not on file  Occupational History  . Not on file  Tobacco Use  . Smoking status: Former Smoker    Packs/day: 1.50    Years: 35.00    Pack years: 52.50    Types: Cigarettes    Quit date: 03/30/2017    Years since quitting: 3.4  . Smokeless tobacco: Never Used  Vaping Use  . Vaping Use: Every day  Substance and Sexual Activity  . Alcohol use: No  . Drug use: No  . Sexual activity: Yes  Other Topics Concern  . Not on file  Social History Narrative  . Not on file    Social Determinants of Health   Financial Resource Strain: Not on file  Food Insecurity: Not on file  Transportation Needs: Not on file  Physical Activity: Not on file  Stress: Not on file  Social Connections: Not on file   Family History: History reviewed. No pertinent family history. Allergies: No Known Allergies Medications: See med rec.  Review of Systems: See HPI for pertinent positives and negatives.   Objective:    General: Well Developed, well nourished, and in no acute distress.  Neuro: Alert and oriented x3.  HEENT: Normocephalic, atraumatic.  Skin: Warm and dry. Cardiac: Regular rate and rhythm, no murmurs rubs or gallops, no lower extremity edema.  Respiratory: Clear to auscultation bilaterally. Not using accessory muscles, speaking in full sentences.   Impression and Recommendations:    1. Chronic obstructive pulmonary disease, unspecified COPD type ([place] Stop tiotropium-olodaterol. Start Advair inhaler. Use Albuterol as needed. Strongly encourage cessation of vaping.  2. Hypertension goal BP (blood pressure) < 130/80 At goal without medications. Continue diet/lifestyle modification.s   3. Mixed hyperlipidemia  Continue atorvastatin 238mdaily. - atorvastatin (LIPITOR) 20 MG tablet; Take 1 tablet (20 mg total) by mouth daily.  Dispense: 90 tablet; Refill: 3  4. Colon cancer screening Cologuard reordered and refaxed.  - Cologuard  Return in about 6 months (around 02/25/2021) for COPD/HTN/HLD follow up (fasting labs due). ___________________________________________ JoClearnce SorrelDNP, APRN, FNP-BC [facility] [place]

## 2020-09-04 ENCOUNTER — Encounter: Payer: Self-pay | Admitting: Medical-Surgical

## 2020-09-04 MED ORDER — FLUTICASONE-SALMETEROL 250-50 MCG/DOSE IN AEPB
1.0000 | INHALATION_SPRAY | Freq: Two times a day (BID) | RESPIRATORY_TRACT | 3 refills | Status: DC
Start: 2020-09-04 — End: 2021-01-02

## 2020-09-16 LAB — COLOGUARD
COLOGUARD: NEGATIVE
Cologuard: NEGATIVE

## 2020-09-16 LAB — EXTERNAL GENERIC LAB PROCEDURE: COLOGUARD: NEGATIVE

## 2020-12-27 ENCOUNTER — Encounter: Payer: Self-pay | Admitting: Medical-Surgical

## 2020-12-27 ENCOUNTER — Telehealth (INDEPENDENT_AMBULATORY_CARE_PROVIDER_SITE_OTHER): Payer: Commercial Managed Care - PPO | Admitting: Medical-Surgical

## 2020-12-27 VITALS — BP 138/88 | HR 76 | Temp 99.2°F | Resp 20 | Ht 65.0 in | Wt 150.4 lb

## 2020-12-27 DIAGNOSIS — U071 COVID-19: Secondary | ICD-10-CM

## 2020-12-27 MED ORDER — MOLNUPIRAVIR 200 MG PO CAPS: 800.0000 mg | ORAL_CAPSULE | Freq: Two times a day (BID) | ORAL | 0 refills | Status: DC

## 2020-12-27 NOTE — Progress Notes (Signed)
Tested Covid positive yesterday Feels like the flu.

## 2020-12-27 NOTE — Progress Notes (Signed)
Virtual Visit via Video Note  I connected with Brandon Phillips on 12/27/20 at 10:10 AM EDT by a video enabled telemedicine application and verified that I am speaking with the correct person using two identifiers.   I discussed the limitations of evaluation and management by telemedicine and the availability of in person appointments. The patient expressed understanding and agreed to proceed.  Patient location: home Provider locations: office  Subjective:    CC: covid +  HPI: Brandon Phillips 55 year old male presenting via MyChart video visit with reports of viral symptoms including fatigue, body aches, low-grade fever T-max 100, cough, chest congestion, and decreased appetite.  Symptoms started while he was at work on 5/30.  He took a COVID test yesterday with positive results.  Is interested in the oral antiviral considering his lung issues with COPD.  No current difficulty with eating, drinking, or sleeping.  Past medical history, Surgical history, Family history not pertinant except as noted below, Social history, Allergies, and medications have been entered into the medical record, reviewed, and corrections made.   Review of Systems: See HPI for pertinent positives and negatives.   Objective:    General: Speaking clearly in complete sentences without any shortness of breath.  Alert and oriented x3.  Normal judgment. No apparent acute distress.  Impression and Recommendations:    1. COVID-19 virus infection Sending in Molnupiravir 800 mg twice daily x5 days.  Continue to use over-the-counter cold and flu preparations for symptom management.  Reviewed emergency symptoms that should prompt evaluation at an urgent care or emergency room.  Depending on his symptom progression, we will have a low threshold for initiating antibiotics/steroid for COPD exacerbation.  Patient verbalized understanding and is agreeable to the plan.  I discussed the assessment and treatment plan with the patient. The  patient was provided an opportunity to ask questions and all were answered. The patient agreed with the plan and demonstrated an understanding of the instructions.   The patient was advised to call back or seek an in-person evaluation if the symptoms worsen or if the condition fails to improve as anticipated.  20 minutes of non-face-to-face time was provided during this encounter.  Return if symptoms worsen or fail to improve.  Clearnce Sorrel, DNP, APRN, FNP-BC [facility]

## 2021-01-02 ENCOUNTER — Other Ambulatory Visit: Payer: Self-pay | Admitting: Medical-Surgical

## 2021-05-15 ENCOUNTER — Other Ambulatory Visit: Payer: Self-pay | Admitting: Medical-Surgical

## 2021-08-20 ENCOUNTER — Other Ambulatory Visit: Payer: Self-pay | Admitting: Medical-Surgical

## 2021-08-20 DIAGNOSIS — E782 Mixed hyperlipidemia: Secondary | ICD-10-CM

## 2021-09-19 ENCOUNTER — Other Ambulatory Visit: Payer: Self-pay | Admitting: Medical-Surgical

## 2021-09-19 DIAGNOSIS — E782 Mixed hyperlipidemia: Secondary | ICD-10-CM

## 2021-10-17 ENCOUNTER — Other Ambulatory Visit: Payer: Self-pay | Admitting: Medical-Surgical

## 2021-10-17 DIAGNOSIS — E782 Mixed hyperlipidemia: Secondary | ICD-10-CM

## 2021-10-31 ENCOUNTER — Other Ambulatory Visit: Payer: Self-pay | Admitting: Medical-Surgical

## 2021-10-31 DIAGNOSIS — E782 Mixed hyperlipidemia: Secondary | ICD-10-CM

## 2021-11-11 ENCOUNTER — Other Ambulatory Visit: Payer: Self-pay | Admitting: Medical-Surgical

## 2021-11-11 DIAGNOSIS — E782 Mixed hyperlipidemia: Secondary | ICD-10-CM

## 2021-11-22 ENCOUNTER — Other Ambulatory Visit: Payer: Self-pay | Admitting: Medical-Surgical

## 2021-11-22 DIAGNOSIS — E782 Mixed hyperlipidemia: Secondary | ICD-10-CM

## 2021-12-10 ENCOUNTER — Other Ambulatory Visit: Payer: Self-pay | Admitting: Medical-Surgical

## 2021-12-10 DIAGNOSIS — E782 Mixed hyperlipidemia: Secondary | ICD-10-CM

## 2022-01-04 ENCOUNTER — Ambulatory Visit (INDEPENDENT_AMBULATORY_CARE_PROVIDER_SITE_OTHER): Payer: BC Managed Care – PPO | Admitting: Medical-Surgical

## 2022-01-04 ENCOUNTER — Encounter: Payer: Self-pay | Admitting: Medical-Surgical

## 2022-01-04 VITALS — BP 128/86 | HR 73 | Resp 20 | Ht 65.0 in | Wt 166.3 lb

## 2022-01-04 DIAGNOSIS — Z0000 Encounter for general adult medical examination without abnormal findings: Secondary | ICD-10-CM | POA: Diagnosis not present

## 2022-01-04 DIAGNOSIS — F17200 Nicotine dependence, unspecified, uncomplicated: Secondary | ICD-10-CM

## 2022-01-04 DIAGNOSIS — J449 Chronic obstructive pulmonary disease, unspecified: Secondary | ICD-10-CM | POA: Diagnosis not present

## 2022-01-04 DIAGNOSIS — I10 Essential (primary) hypertension: Secondary | ICD-10-CM | POA: Diagnosis not present

## 2022-01-04 DIAGNOSIS — Z125 Encounter for screening for malignant neoplasm of prostate: Secondary | ICD-10-CM

## 2022-01-04 DIAGNOSIS — E782 Mixed hyperlipidemia: Secondary | ICD-10-CM

## 2022-01-04 MED ORDER — ATORVASTATIN CALCIUM 20 MG PO TABS: ORAL_TABLET | ORAL | 0 refills | Status: DC

## 2022-01-04 MED ORDER — BUDESONIDE-FORMOTEROL FUMARATE 160-4.5 MCG/ACT IN AERO: 2.0000 | INHALATION_SPRAY | Freq: Two times a day (BID) | RESPIRATORY_TRACT | 3 refills | Status: DC

## 2022-01-04 MED ORDER — ALBUTEROL SULFATE HFA 108 (90 BASE) MCG/ACT IN AERS: 2.0000 | INHALATION_SPRAY | Freq: Four times a day (QID) | RESPIRATORY_TRACT | 11 refills | Status: AC | PRN

## 2022-01-04 NOTE — Progress Notes (Signed)
Complete physical exam  Patient: Brandon Phillips   DOB: 1965-09-20   56 y.o. Male  MRN: [id number]  Subjective:    No chief complaint on file.   Brandon Phillips is a 56 y.o. male who presents today for a complete physical exam. He reports consuming a general diet.  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  He generally feels well except for his lungs. He reports sleeping fairly well. He does not have additional problems to discuss today.    Most recent fall risk assessment:    12/27/2020   10:18 AM  Fall Risk   Falls in the past year? 0  Number falls in past yr: 0  Injury with Fall? 0  Risk for fall due to : No Fall Risks  Follow up Falls evaluation completed     Most recent depression screenings:    12/27/2020   10:12 AM 02/08/2020    2:06 PM  PHQ 2/9 Scores  PHQ - 2 Score 0 0  PHQ- 9 Score  0    Vision:Within last year, Dental: No current dental problems and Receives regular dental care, STD: The patient denies history of sexually transmitted disease., and PSA: Prostate cancer screening and PSA options (with potential risks and benefits of testing vs. not testing) were discussed along with recent recs/guidelines.     Patient Care Team: Samuel Bouche, NP as PCP - General (Nurse Practitioner)   Outpatient Medications Prior to Visit  Medication Sig   albuterol (VENTOLIN HFA) 108 (90 Base) MCG/ACT inhaler Inhale 2 puffs into the lungs every 6 (six) hours as needed for wheezing.   atorvastatin (LIPITOR) 20 MG tablet TAKE ONE TABLET BY MOUTH DAILY -NEED APPOINTMENT FOR FURTHER REFILLS-   BABY ASPIRIN PO Take by mouth.   fluticasone (FLONASE) 50 MCG/ACT nasal spray Place 1 spray into both nostrils Nightly.   fluticasone-salmeterol (ADVAIR) 250-50 MCG/ACT AEPB INHALE ONE PUFF BY MOUTH TWICE A DAY   Molnupiravir 200 MG CAPS Take 4 capsules (800 mg total) by mouth in the morning and at bedtime.   UNABLE TO FIND Med Name: Bronch-Aid   No facility-administered medications  prior to visit.   Review of Systems  Constitutional:  Negative for chills, fever, malaise/fatigue and weight loss.  HENT:  Negative for congestion, ear pain, hearing loss, sinus pain and sore throat.   Eyes:  Negative for blurred vision, photophobia and pain.  Respiratory:  Positive for cough, sputum production, shortness of breath and wheezing.   Cardiovascular:  Negative for chest pain, palpitations and leg swelling.  Gastrointestinal:  Negative for abdominal pain, constipation, diarrhea, heartburn, nausea and vomiting.  Genitourinary:  Negative for dysuria, frequency and urgency.  Musculoskeletal:  Negative for falls and neck pain.  Skin:  Negative for itching and rash.  Neurological:  Negative for dizziness, weakness and headaches.  Endo/Heme/Allergies:  Negative for polydipsia. Does not bruise/bleed easily.  Psychiatric/Behavioral:  Negative for depression, substance abuse and suicidal ideas. The patient is not nervous/anxious.        Objective:     There were no vitals taken for this visit.   Physical Exam Constitutional:      General: He is not in acute distress.    Appearance: Normal appearance. He is not ill-appearing.  HENT:     Head: Normocephalic and atraumatic.     Right Ear: Tympanic membrane, ear canal and external ear normal. There is no impacted cerumen.     Left Ear: Tympanic membrane, ear canal and  external ear normal. There is no impacted cerumen.     Nose: Nose normal.     Mouth/Throat:     Mouth: Mucous membranes are moist.     Pharynx: No oropharyngeal exudate or posterior oropharyngeal erythema.  Eyes:     Extraocular Movements: Extraocular movements intact.     Conjunctiva/sclera: Conjunctivae normal.     Pupils: Pupils are equal, round, and reactive to light.  Neck:     Thyroid: No thyromegaly.     Vascular: No carotid bruit or JVD.     Trachea: Trachea normal.  Cardiovascular:     Rate and Rhythm: Normal rate and regular rhythm.     Pulses:  Normal pulses.     Heart sounds: Normal heart sounds. No murmur heard.    No friction rub. No gallop.  Pulmonary:     Effort: Pulmonary effort is normal. No respiratory distress.     Breath sounds: Wheezing present.  Abdominal:     General: Bowel sounds are normal. There is no distension.     Palpations: Abdomen is soft.     Tenderness: There is no abdominal tenderness. There is no guarding.  Musculoskeletal:        General: Normal range of motion.     Cervical back: Normal range of motion and neck supple.  Skin:    General: Skin is warm and dry.  Neurological:     Mental Status: He is alert and oriented to person, place, and time.     Cranial Nerves: No cranial nerve deficit.  Psychiatric:        Mood and Affect: Mood normal.        Behavior: Behavior normal.        Thought Content: Thought content normal.        Judgment: Judgment normal.   No results found for any visits on 01/04/22.     Assessment & Plan:    Routine Health Maintenance and Physical Exam  Immunization History  Administered Date(s) Administered   PFIZER(Purple Top)SARS-COV-2 Vaccination 08/03/2020, 08/25/2020   Pneumococcal Polysaccharide-23 01/15/2018   Tdap 01/15/2018   Zoster Recombinat (Shingrix) 07/30/2019, 11/27/2019    Health Maintenance  Topic Date Due   COVID-19 Vaccine (3 - Pfizer series) 10/20/2020   INFLUENZA VACCINE  02/26/2022   Fecal DNA (Cologuard)  09/12/2023   TETANUS/TDAP  01/16/2028   Hepatitis C Screening  Completed   HIV Screening  Completed   Zoster Vaccines- Shingrix  Completed   HPV VACCINES  Aged Out   URINE MICROALBUMIN  Discontinued    Discussed health benefits of physical activity, and encouraged him to engage in regular exercise appropriate for his age and condition.  1. Annual physical exam Checking labs as below.  Up-to-date on preventative care.  Wellness information provided with AVS. - Lipid panel - COMPLETE METABOLIC PANEL WITH GFR - CBC with  Differential/Platelet  2. Hypertension goal BP (blood pressure) < 130/80 Checking labs as below.  Blood pressure elevated on arrival however recheck it looked good.  Continue regular intentional exercise and low-sodium diet. - Lipid panel - COMPLETE METABOLIC PANEL WITH GFR - CBC with Differential/Platelet  3. Chronic obstructive pulmonary disease, unspecified COPD type ([place]) Continue albuterol inhaler as needed.  Refilling today.  Feels that Symbicort works much better than Advair and would like to try switching back to this since he has new insurance.  Sending in Symbicort 160-4.5 mcg per actuation 2 puffs twice daily.  4. Tobacco use disorder Resolved.  Quit 03/30/2017.  5. Prostate cancer screening Checking PSA - PSA  6. Mixed hyperlipidemia Checking lipid panel.  Continue atorvastatin 20 mg daily. - Lipid panel - atorvastatin (LIPITOR) 20 MG tablet; TAKE ONE TABLET BY MOUTH DAILY  Dispense: 30 tablet; Refill: 0  No follow-ups on file.   Samuel Bouche, NP

## 2022-01-06 ENCOUNTER — Other Ambulatory Visit: Payer: Self-pay | Admitting: Medical-Surgical

## 2022-01-06 DIAGNOSIS — E782 Mixed hyperlipidemia: Secondary | ICD-10-CM

## 2022-01-11 DIAGNOSIS — I10 Essential (primary) hypertension: Secondary | ICD-10-CM | POA: Diagnosis not present

## 2022-01-11 DIAGNOSIS — E782 Mixed hyperlipidemia: Secondary | ICD-10-CM | POA: Diagnosis not present

## 2022-01-11 DIAGNOSIS — Z0000 Encounter for general adult medical examination without abnormal findings: Secondary | ICD-10-CM | POA: Diagnosis not present

## 2022-01-11 DIAGNOSIS — Z125 Encounter for screening for malignant neoplasm of prostate: Secondary | ICD-10-CM | POA: Diagnosis not present

## 2022-01-12 LAB — LIPID PANEL
Cholesterol: 153 mg/dL (ref <200)
HDL: 47 mg/dL (ref >=40)
LDL Cholesterol (Calc): 88 mg/dL (calc)
Non-HDL Cholesterol (Calc): 106 mg/dL (calc) (ref <130)
Total CHOL/HDL Ratio: 3.3 (calc) (ref <5.0)
Triglycerides: 90 mg/dL (ref <150)

## 2022-01-12 LAB — PSA: PSA: 1.09 ng/mL (ref <=4.00)

## 2022-01-12 LAB — COMPLETE METABOLIC PANEL WITH GFR
AG Ratio: 1.7 (calc) (ref 1.0–2.5)
ALT: 26 U/L (ref 9–46)
AST: 19 U/L (ref 10–35)
Albumin: 4.6 g/dL (ref 3.6–5.1)
Alkaline phosphatase (APISO): 58 U/L (ref 35–144)
BUN: 14 mg/dL (ref 7–25)
CO2: 29 mmol/L (ref 20–32)
Calcium: 9.8 mg/dL (ref 8.6–10.3)
Chloride: 100 mmol/L (ref 98–110)
Creat: 1.05 mg/dL (ref 0.70–1.30)
Globulin: 2.7 g/dL (calc) (ref 1.9–3.7)
Glucose, Bld: 102 mg/dL — ABNORMAL HIGH (ref 65–99)
Potassium: 4.4 mmol/L (ref 3.5–5.3)
Sodium: 140 mmol/L (ref 135–146)
Total Bilirubin: 0.5 mg/dL (ref 0.2–1.2)
Total Protein: 7.3 g/dL (ref 6.1–8.1)
eGFR: 84 mL/min/{1.73_m2} (ref >=60)

## 2022-01-12 LAB — CBC WITH DIFFERENTIAL/PLATELET
Absolute Monocytes: 743 cells/uL (ref 200–950)
Basophils Absolute: 85 cells/uL (ref 0–200)
Basophils Relative: 0.9 %
Eosinophils Absolute: 197 cells/uL (ref 15–500)
Eosinophils Relative: 2.1 %
HCT: 48.1 % (ref 38.5–50.0)
Hemoglobin: 16.5 g/dL (ref 13.2–17.1)
Lymphs Abs: 1795 cells/uL (ref 850–3900)
MCH: 30.1 pg (ref 27.0–33.0)
MCHC: 34.3 g/dL (ref 32.0–36.0)
MCV: 87.8 fL (ref 80.0–100.0)
MPV: 9.9 fL (ref 7.5–12.5)
Monocytes Relative: 7.9 %
Neutro Abs: 6580 cells/uL (ref 1500–7800)
Neutrophils Relative %: 70 %
Platelets: 256 10*3/uL (ref 140–400)
RBC: 5.48 10*6/uL (ref 4.20–5.80)
RDW: 13.2 % (ref 11.0–15.0)
Total Lymphocyte: 19.1 %
WBC: 9.4 10*3/uL (ref 3.8–10.8)

## 2022-01-23 ENCOUNTER — Other Ambulatory Visit: Payer: Self-pay | Admitting: Medical-Surgical

## 2022-02-03 ENCOUNTER — Other Ambulatory Visit: Payer: Self-pay | Admitting: Medical-Surgical

## 2022-02-03 DIAGNOSIS — E782 Mixed hyperlipidemia: Secondary | ICD-10-CM

## 2022-02-04 ENCOUNTER — Other Ambulatory Visit: Payer: Self-pay | Admitting: Medical-Surgical

## 2022-02-04 DIAGNOSIS — E782 Mixed hyperlipidemia: Secondary | ICD-10-CM

## 2022-02-05 MED ORDER — ATORVASTATIN CALCIUM 20 MG PO TABS: ORAL_TABLET | ORAL | 1 refills | Status: DC

## 2022-02-24 IMAGING — CT CT CHEST W/O CM
2 of 4 series · 15 of 36 positions shown, 18 images · non-contrast
Comparison: None.

CLINICAL DATA: Smoker.  Shortness of breath.

EXAM:
CT CHEST WITHOUT CONTRAST
TECHNIQUE: Multidetector CT imaging of the chest was performed following the
standard protocol without IV contrast.

[Series 4: lungs · axial · 0.79mm/px · z∈[-410,-94]mm · 12 of 178 slices shown, 15 images]
[im 10/178  mediastinal]
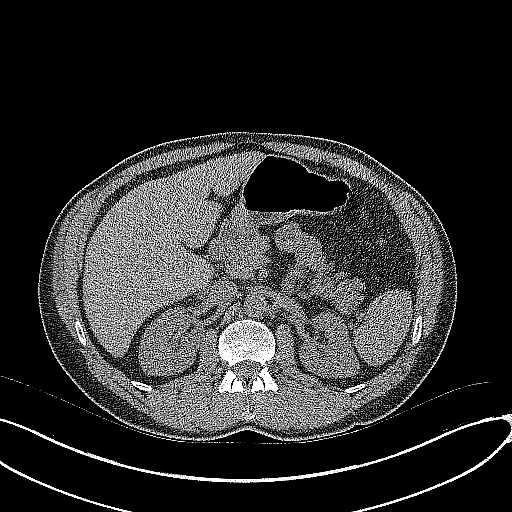
[im 10/178  lung]
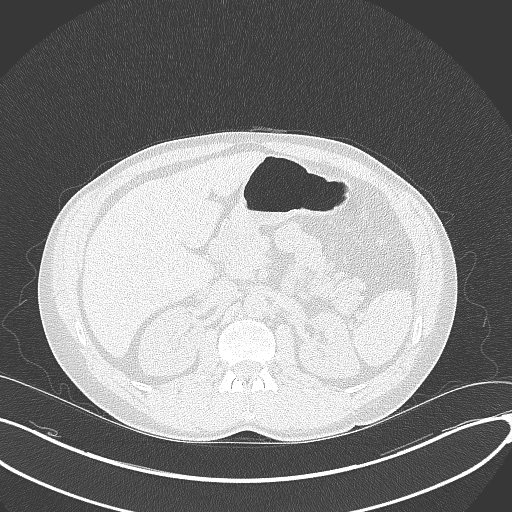
[im 28/178  lung]
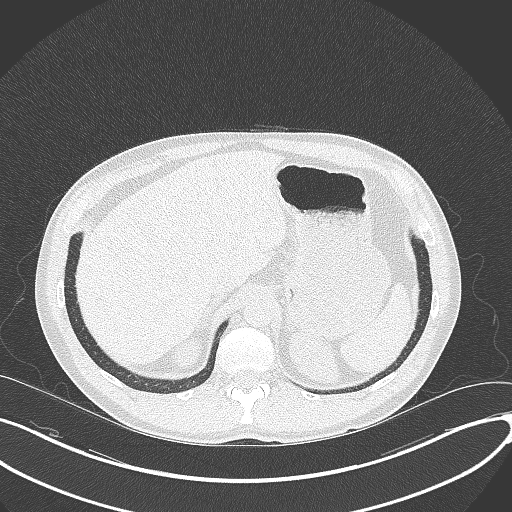
[im 38/178  lung]
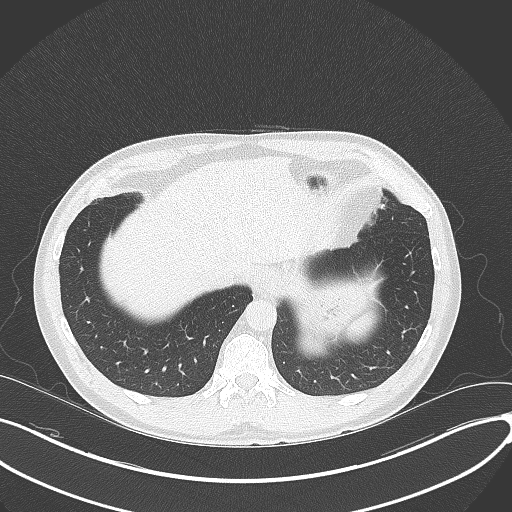
[im 56/178  lung]
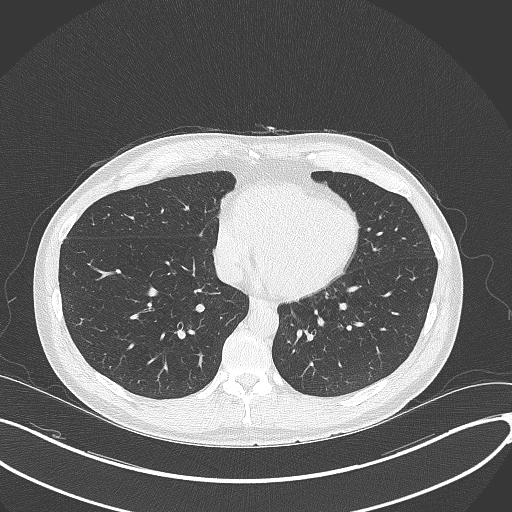
[im 66/178  mediastinal]
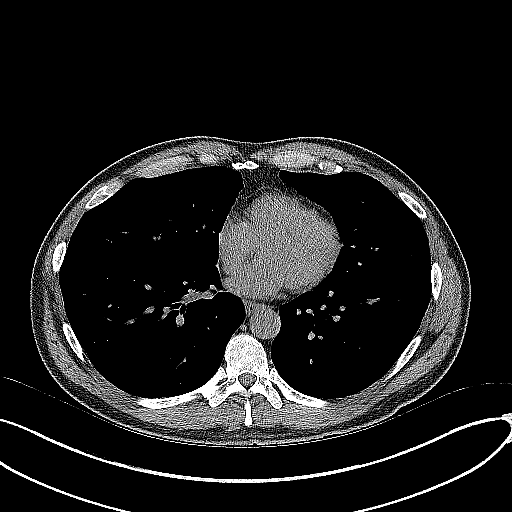
[im 66/178  lung]
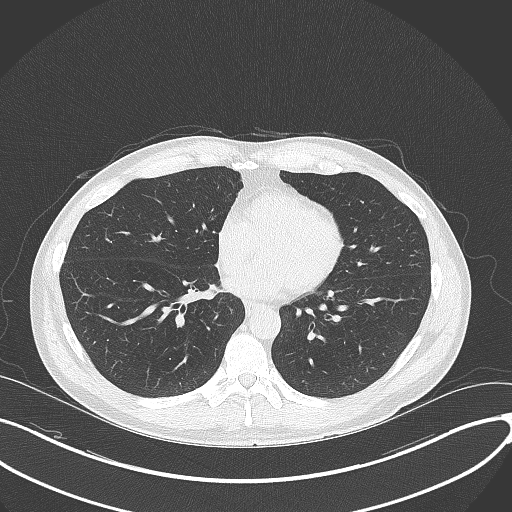
[im 84/178  lung]
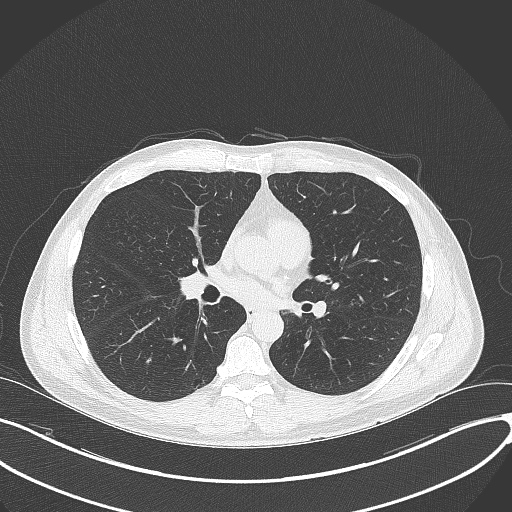
[im 94/178  lung]
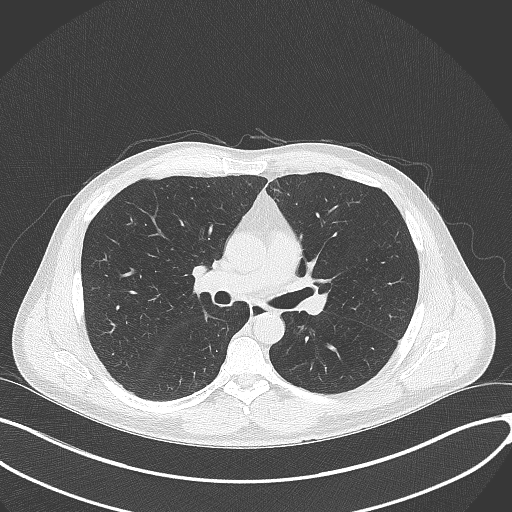
[im 112/178  lung]
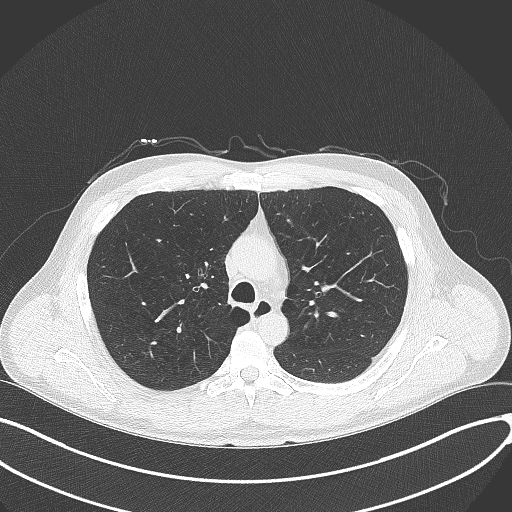
[im 122/178  mediastinal]
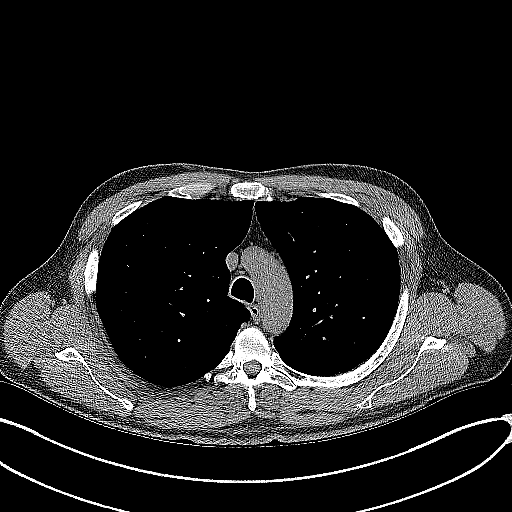
[im 122/178  lung]
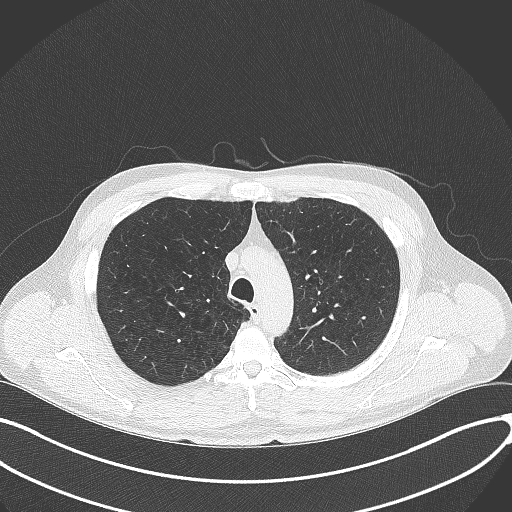
[im 140/178  lung]
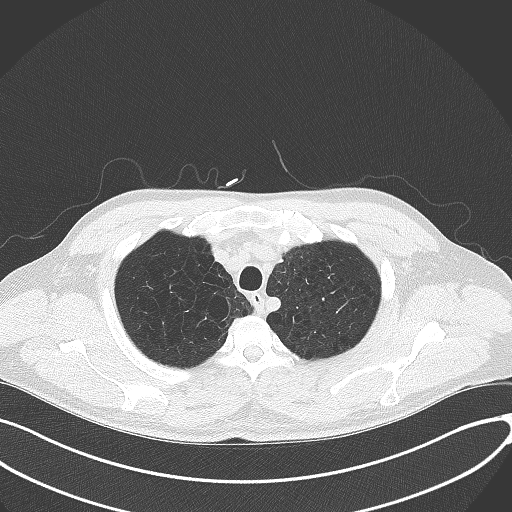
[im 150/178  lung]
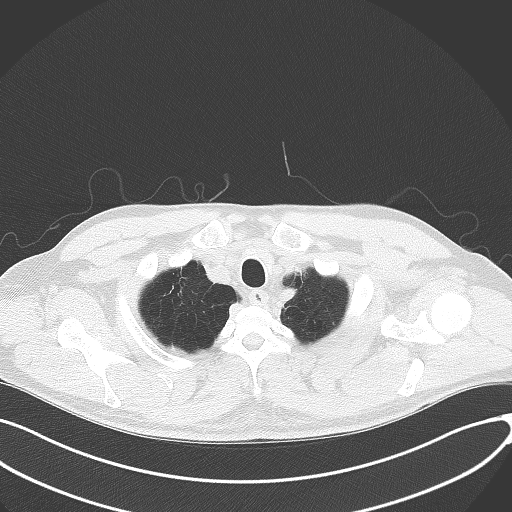
[im 168/178  lung]
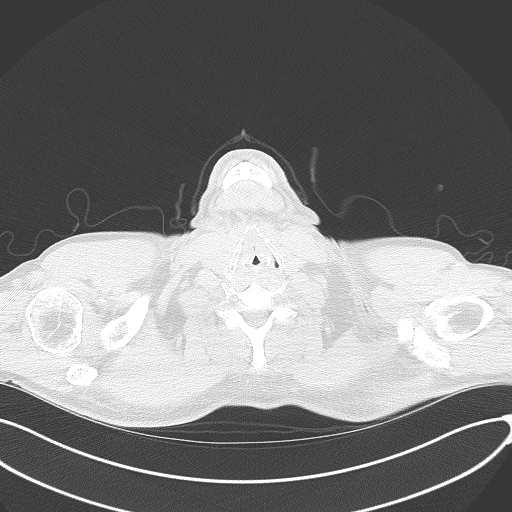

[Series 5: coronal · coronal · 0.70mm/px · 3 of 128 slices shown]
[im 26/128  lung]
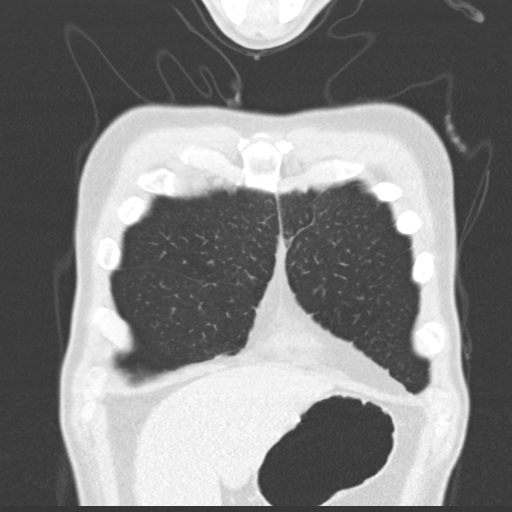
[im 51/128  lung]
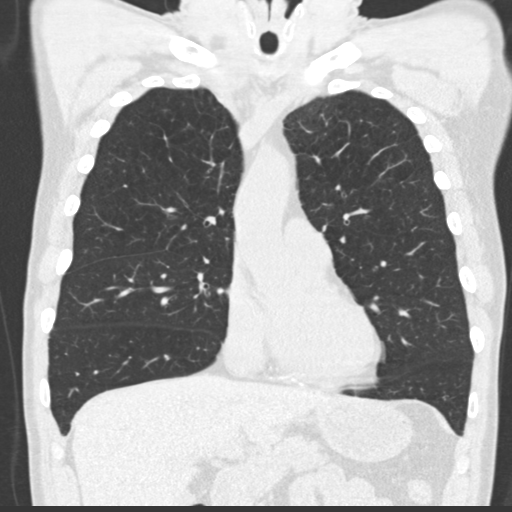
[im 77/128  lung]
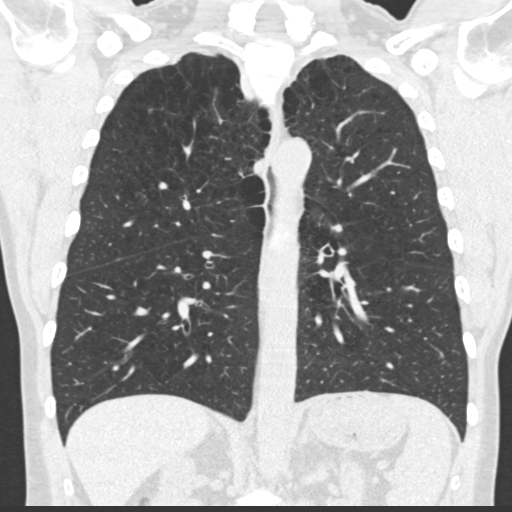

[15 of 36 positions shown; findings below may reference images not displayed]

FINDINGS: Cardiovascular: Heart is normal size. Aorta is normal caliber.
Scattered calcifications in the coronary arteries, most pronounced
in the LAD.

Mediastinum/Nodes: No mediastinal, hilar, or axillary adenopathy.
Trachea and esophagus are unremarkable. Thyroid unremarkable.

Lungs/Pleura: Moderate to advanced emphysema in the upper lobes. No
confluent opacities, effusions or suspicious nodules.

Upper Abdomen: No acute findings.

Musculoskeletal: Chest wall soft tissues are unremarkable. No acute
bony abnormality.
IMPRESSION: No acute cardiopulmonary disease.

Coronary artery calcifications.

Emphysema ([id number]).

## 2022-05-06 ENCOUNTER — Other Ambulatory Visit: Payer: Self-pay | Admitting: Medical-Surgical

## 2022-06-02 ENCOUNTER — Other Ambulatory Visit: Payer: Self-pay | Admitting: Medical-Surgical

## 2022-07-03 ENCOUNTER — Other Ambulatory Visit: Payer: Self-pay | Admitting: Medical-Surgical

## 2022-07-07 NOTE — Progress Notes (Unsigned)
   Established Patient Office Visit  Subjective   Patient ID: Murlin Schrieber, male   DOB: [birth year] Age: 56 y.o. MRN: [id number]   No chief complaint on file.   HPI Pleasant 56 year old male presenting today for the following:  Hypertension:  COPD:   Objective:    There were no vitals filed for this visit.  Physical Exam   No results found for this or any previous visit (from the past 24 hour(s)).   {Labs (Optional):23779}  The 10-year ASCVD risk score (Arnett DK, et al., 2019) is: 7.4%   Values used to calculate the score:     Age: 70 years     Sex: Male     Is Non-Hispanic African American: No     Diabetic: Yes     Tobacco smoker: No     Systolic Blood Pressure: 224 mmHg     Is BP treated: No     HDL Cholesterol: 47 mg/dL     Total Cholesterol: 153 mg/dL   Assessment & Plan:   No problem-specific Assessment & Plan notes found for this encounter.   No follow-ups on file.  ___________________________________________ Clearnce Sorrel, DNP, APRN, FNP-BC [facility] [place]

## 2022-07-08 ENCOUNTER — Encounter: Payer: Self-pay | Admitting: Medical-Surgical

## 2022-07-08 ENCOUNTER — Ambulatory Visit (INDEPENDENT_AMBULATORY_CARE_PROVIDER_SITE_OTHER): Payer: BC Managed Care – PPO | Admitting: Medical-Surgical

## 2022-07-08 VITALS — BP 133/80 | HR 80 | Resp 20 | Ht 65.0 in | Wt 170.0 lb

## 2022-07-08 DIAGNOSIS — F17200 Nicotine dependence, unspecified, uncomplicated: Secondary | ICD-10-CM | POA: Diagnosis not present

## 2022-07-08 DIAGNOSIS — J449 Chronic obstructive pulmonary disease, unspecified: Secondary | ICD-10-CM | POA: Diagnosis not present

## 2022-07-08 DIAGNOSIS — I10 Essential (primary) hypertension: Secondary | ICD-10-CM

## 2022-07-08 DIAGNOSIS — Z87891 Personal history of nicotine dependence: Secondary | ICD-10-CM | POA: Diagnosis not present

## 2022-07-08 DIAGNOSIS — E782 Mixed hyperlipidemia: Secondary | ICD-10-CM | POA: Diagnosis not present

## 2022-07-08 MED ORDER — BUDESONIDE-FORMOTEROL FUMARATE 160-4.5 MCG/ACT IN AERO: 2.0000 | INHALATION_SPRAY | Freq: Two times a day (BID) | RESPIRATORY_TRACT | 6 refills | Status: DC

## 2022-07-08 MED ORDER — ATORVASTATIN CALCIUM 20 MG PO TABS: ORAL_TABLET | ORAL | 3 refills | Status: DC

## 2022-07-08 MED ORDER — BUDESONIDE-FORMOTEROL FUMARATE 160-4.5 MCG/ACT IN AERO: 2.0000 | INHALATION_SPRAY | Freq: Two times a day (BID) | RESPIRATORY_TRACT | 0 refills | Status: DC

## 2022-07-08 MED ORDER — ATORVASTATIN CALCIUM 20 MG PO TABS: ORAL_TABLET | ORAL | 1 refills | Status: DC

## 2022-07-25 ENCOUNTER — Ambulatory Visit (INDEPENDENT_AMBULATORY_CARE_PROVIDER_SITE_OTHER): Payer: BC Managed Care – PPO

## 2022-07-25 DIAGNOSIS — F17290 Nicotine dependence, other tobacco product, uncomplicated: Secondary | ICD-10-CM | POA: Diagnosis not present

## 2022-07-25 DIAGNOSIS — Z87891 Personal history of nicotine dependence: Secondary | ICD-10-CM | POA: Diagnosis not present

## 2022-07-25 DIAGNOSIS — F17200 Nicotine dependence, unspecified, uncomplicated: Secondary | ICD-10-CM

## 2022-07-30 ENCOUNTER — Other Ambulatory Visit: Payer: Self-pay | Admitting: Medical-Surgical

## 2022-07-30 DIAGNOSIS — R918 Other nonspecific abnormal finding of lung field: Secondary | ICD-10-CM

## 2022-08-05 ENCOUNTER — Encounter: Payer: Self-pay | Admitting: Medical-Surgical

## 2022-08-14 ENCOUNTER — Telehealth: Payer: Self-pay | Admitting: Medical-Surgical

## 2022-08-14 ENCOUNTER — Ambulatory Visit (HOSPITAL_COMMUNITY)
Admission: RE | Admit: 2022-08-14 | Discharge: 2022-08-14 | Disposition: A | Discharge status: Home / Self Care | Payer: BC Managed Care – PPO | Source: Ambulatory Visit | Attending: Medical-Surgical | Admitting: Medical-Surgical

## 2022-08-14 ENCOUNTER — Encounter: Payer: Self-pay | Admitting: Medical-Surgical

## 2022-08-14 DIAGNOSIS — K5730 Diverticulosis of large intestine without perforation or abscess without bleeding: Secondary | ICD-10-CM | POA: Diagnosis not present

## 2022-08-14 DIAGNOSIS — R918 Other nonspecific abnormal finding of lung field: Secondary | ICD-10-CM | POA: Insufficient documentation

## 2022-08-14 DIAGNOSIS — C3490 Malignant neoplasm of unspecified part of unspecified bronchus or lung: Secondary | ICD-10-CM | POA: Diagnosis not present

## 2022-08-14 DIAGNOSIS — I2510 Atherosclerotic heart disease of native coronary artery without angina pectoris: Secondary | ICD-10-CM | POA: Diagnosis not present

## 2022-08-14 DIAGNOSIS — J432 Centrilobular emphysema: Secondary | ICD-10-CM | POA: Diagnosis not present

## 2022-08-14 LAB — GLUCOSE, CAPILLARY: Glucose-Capillary: 113 mg/dL — ABNORMAL HIGH (ref 70–99)

## 2022-08-14 MED ORDER — FLUDEOXYGLUCOSE F - 18 (FDG) INJECTION
8.6000 | Freq: Once | INTRAVENOUS | Status: AC | PRN
  Administered 2022-08-14: 8.6 via INTRAVENOUS

## 2022-08-14 NOTE — Telephone Encounter (Signed)
Nuclear med PET scan completed today.  Report available in [facility].  Findings include a right upper lobe pulmonary nodule of 8 mm that is considered nonspecific.  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  Called patient and discussed the findings over the phone.  Opportunity provided to ask questions however he is understandably upset and did not have any at the moment.  Referral has been placed to oncology as well as pulmonology for bronchoscopy, biopsy, and further discussion of treatment recommendations.  Patient aware of referrals placed and recommended next steps.  Advised him should he or his wife have any questions or concerns, they are welcome to contact us via MyChart or phone and I will be glad to do what I can to answer them while awaiting pulmonology/oncology input.  ___________________________________________ Thayer Ohm, DNP, APRN, FNP-BC [facility]

## 2022-08-15 ENCOUNTER — Encounter: Payer: Self-pay | Admitting: *Deleted

## 2022-08-15 NOTE — Progress Notes (Signed)
Reached out to Brandon Phillips to introduce myself as the office RN Navigator and explain our new patient process.Spoke to patient's wife Brandon Phillips. Reviewed the reason for their referral and scheduled their new patient appointment along with labs. Provided address and directions to the office including call back phone number. Reviewed with patient any concerns they may have or any possible barriers to attending their appointment.   Informed patient about my role as a navigator and that I will meet with them prior to their New Patient appointment and more fully discuss what services I can provide. At this time patient has no further questions or needs.    Oncology Nurse Navigator Documentation     08/15/2022    9:30 AM  Oncology Nurse Navigator Flowsheets  Abnormal Finding Date 07/30/2022  Diagnosis Status Additional Work Up  Navigator Follow Up Date: 08/16/2022  Navigator Follow Up Reason: New Patient Appointment  Navigator Location [facility]  Referral Date to RadOnc/MedOnc 08/14/2022  Navigator Encounter Type Introductory Phone Call  Patient Visit Type MedOnc  Treatment Phase Abnormal Scans  Barriers/Navigation Needs Coordination of Care;Education  Education Other  Interventions Coordination of Care;Education  Acuity Level 2-Minimal Needs (1-2 Barriers Identified)  Coordination of Care Appts  Education Method Verbal;Teach-back  Support Groups/Services Friends and Family  Time Spent with Patient 30

## 2022-08-16 ENCOUNTER — Encounter: Payer: Self-pay | Admitting: Hematology & Oncology

## 2022-08-16 ENCOUNTER — Inpatient Hospital Stay: Payer: BC Managed Care – PPO | Attending: Hematology & Oncology

## 2022-08-16 ENCOUNTER — Inpatient Hospital Stay (HOSPITAL_BASED_OUTPATIENT_CLINIC_OR_DEPARTMENT_OTHER): Payer: BC Managed Care – PPO | Admitting: Hematology & Oncology

## 2022-08-16 ENCOUNTER — Encounter: Payer: Self-pay | Admitting: *Deleted

## 2022-08-16 VITALS — BP 142/90 | HR 75 | Temp 98.7°F | Resp 20 | Ht 65.75 in | Wt 168.1 lb

## 2022-08-16 DIAGNOSIS — J984 Other disorders of lung: Secondary | ICD-10-CM

## 2022-08-16 DIAGNOSIS — F17210 Nicotine dependence, cigarettes, uncomplicated: Secondary | ICD-10-CM | POA: Insufficient documentation

## 2022-08-16 DIAGNOSIS — Z79899 Other long term (current) drug therapy: Secondary | ICD-10-CM | POA: Insufficient documentation

## 2022-08-16 DIAGNOSIS — R918 Other nonspecific abnormal finding of lung field: Secondary | ICD-10-CM

## 2022-08-16 DIAGNOSIS — R911 Solitary pulmonary nodule: Secondary | ICD-10-CM | POA: Diagnosis not present

## 2022-08-16 DIAGNOSIS — C3490 Malignant neoplasm of unspecified part of unspecified bronchus or lung: Secondary | ICD-10-CM

## 2022-08-16 LAB — CBC WITH DIFFERENTIAL (CANCER CENTER ONLY)
Abs Immature Granulocytes: 0.04 10*3/uL (ref 0.00–0.07)
Basophils Absolute: 0.1 10*3/uL (ref 0.0–0.1)
Basophils Relative: 1 %
Eosinophils Absolute: 0.1 10*3/uL (ref 0.0–0.5)
Eosinophils Relative: 1 %
HCT: 48.8 % (ref 39.0–52.0)
Hemoglobin: 16.2 g/dL (ref 13.0–17.0)
Immature Granulocytes: 0 %
Lymphocytes Relative: 19 %
Lymphs Abs: 2 10*3/uL (ref 0.7–4.0)
MCH: 29.5 pg (ref 26.0–34.0)
MCHC: 33.2 g/dL (ref 30.0–36.0)
MCV: 88.7 fL (ref 80.0–100.0)
Monocytes Absolute: 0.8 10*3/uL (ref 0.1–1.0)
Monocytes Relative: 7 %
Neutro Abs: 7.8 10*3/uL — ABNORMAL HIGH (ref 1.7–7.7)
Neutrophils Relative %: 72 %
Platelet Count: 278 10*3/uL (ref 150–400)
RBC: 5.5 MIL/uL (ref 4.22–5.81)
RDW: 13.2 % (ref 11.5–15.5)
WBC Count: 10.8 10*3/uL — ABNORMAL HIGH (ref 4.0–10.5)
nRBC: 0 % (ref 0.0–0.2)

## 2022-08-16 LAB — CMP (CANCER CENTER ONLY)
ALT: 30 U/L (ref 0–44)
AST: 21 U/L (ref 15–41)
Albumin: 4.9 g/dL (ref 3.5–5.0)
Alkaline Phosphatase: 60 U/L (ref 38–126)
Anion gap: 9 (ref 5–15)
BUN: 12 mg/dL (ref 6–20)
CO2: 35 mmol/L — ABNORMAL HIGH (ref 22–32)
Calcium: 10.3 mg/dL (ref 8.9–10.3)
Chloride: 98 mmol/L (ref 98–111)
Creatinine: 1.17 mg/dL (ref 0.61–1.24)
GFR, Estimated: >60 mL/min (ref >60)
Glucose, Bld: 93 mg/dL (ref 70–99)
Potassium: 4.4 mmol/L (ref 3.5–5.1)
Sodium: 142 mmol/L (ref 135–145)
Total Bilirubin: 0.5 mg/dL (ref 0.3–1.2)
Total Protein: 8 g/dL (ref 6.5–8.1)

## 2022-08-16 LAB — PREALBUMIN: Prealbumin: 31 mg/dL (ref 18–38)

## 2022-08-16 LAB — LACTATE DEHYDROGENASE: LDH: 146 U/L (ref 98–192)

## 2022-08-16 LAB — CEA (IN HOUSE-CHCC): CEA (CHCC-In House): 2.49 ng/mL (ref 0.00–5.00)

## 2022-08-16 NOTE — Progress Notes (Signed)
Initial RN Navigator Patient Visit  Name: Brandon Phillips Date of Referral : 08/14/2022 Diagnosis: Probable Lung Cancer  Met with patient prior to their visit with MD. [name] patient "Your Patient Navigator" handout which explains my role, areas in which I am able to help, and all the contact information for myself and the office. Also gave patient MD and Navigator business card. Reviewed with patient the general overview of expected course after initial diagnosis and time frame for all steps to be completed.  New patient packet given to patient which includes: orientation to office and staff; campus directory; education on My Chart and Advance Directives; and patient centered education on Lung Cancer  Patient comes in with his wife [name].Patient works full time in maintenance. He does have flexibility with his schedule. His wife asks about FMLA. Explained that we can help fill out any forms or provide information. Encourage them to go ahead and reach out to his HR department. They have no other family in the area. Patient is concerned with any possible surgery due to his COPD. He wants to treat this cancer, but doesn't want to be debilitated while doing so. He is already scheduled with pulmonary next week.   Patient completed visit with Dr. Myna Hidalgo. Once note is dictated and orders place will proceed with plan.  Patient understands all follow up procedures and expectations. They have my number to reach out for any further clarification or additional needs.   Oncology Nurse Navigator Documentation     08/16/2022    2:00 PM  Oncology Nurse Navigator Flowsheets  Navigator Follow Up Date: 08/20/2022  Navigator Follow Up Reason: Review Note  Navigator Location [facility]  Navigator Encounter Type Initial MedOnc  Patient Visit Type MedOnc  Treatment Phase Abnormal Scans  Barriers/Navigation Needs Coordination of Film/video editor;Newly Diagnosed  Cancer Education  Interventions Education;Psycho-Social Support  Acuity Level 2-Minimal Needs (1-2 Barriers Identified)  Education Method Verbal;Written  Support Groups/Services Friends and Family  Time Spent with Patient 45  Genetic Counseling Type None

## 2022-08-16 NOTE — Progress Notes (Signed)
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  : I think of lung cancer.  HPI: [name] is a very nice 57 year old white male.  He is adopted.  He was born in [place].  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  He comes in with his wife.  They are both very nice to talk to.  Has a history of heavy tobacco use.  He probably has a good 80-pack-year history of tobacco use.  He still smokes.  He is trying to cut back.  He actually had no problems with cough.  There is no hoarseness.  There is no hemoptysis.  He had no chest wall pain.  He underwent a screening CT scan of his chest.  This was done on 07/25/2022.  Surprisingly, this showed a left lower lobe mass measuring 3.5 cm.  He had a 8 mm right upper lobe nodule.  He had no obvious thoracic adenopathy.  He subsequently underwent a PET scan.  This was done on 08/14/2022.  The PET scan did show that he had the left lower lobe mass.  He had ipsilateral hilar adenopathy.  The mass left lower lobe measured 3.3 x 2.7 cm.  It had an SUV of 17.  He had a left hilar lymph node.  There was a 8 mm right upper lobe nodule with a incredibly low SUV of 1.6.  He is scheduled for a visit with Pulmonary Medicine next week.  He apparently has "COPD".  He is using inhalers.  He has had no weight loss.  He has had a good appetite.  There is no change in bowel or bladder habits.  He has had no rashes.  He has had no swollen lymph nodes.  He has had no headache.  There is no visual changes.  He has had no problems with COVID or Influenza.  Overall, I would say that his performance status is probably ECOG was 0.    Past Medical History:  Diagnosis Date   CAP (community acquired pneumonia)    Eczema    Tobacco use disorder 01/24/2017  :   Past Surgical History:  Procedure Laterality Date   HERNIA REPAIR      as an infant  :   Current Outpatient Medications:    albuterol (VENTOLIN HFA) 108 (90 Base) MCG/ACT inhaler, Inhale 2 puffs into the lungs every 6 (six) hours as needed for wheezing., Disp: 2 each, Rfl: 11   atorvastatin (LIPITOR) 20 MG tablet, TAKE ONE TABLET BY MOUTH DAILY, Disp: 90 tablet, Rfl: 3   BABY ASPIRIN PO, Take by mouth., Disp: , Rfl:    budesonide-formoterol (SYMBICORT) 160-4.5 MCG/ACT inhaler, Inhale 2 puffs into the lungs 2 (two) times daily., Disp: 10.2 g, Rfl: 6   co-enzyme Q-10 30 MG capsule, Take 30 mg by mouth daily., Disp: , Rfl: :  :  No Known Allergies:  History reviewed. No pertinent family history.:   Social History   Socioeconomic History   Marital status: Married    Spouse name: Not on file   Number of children: Not on file   Years of education: Not on file   Highest education level: Not on file  Occupational History   Not on file  Tobacco Use   Smoking status: Former    Packs/day: 1.50    Years: 35.00  Total pack years: 52.50    Types: Cigarettes    Quit date: 03/30/2017    Years since quitting: 5.3   Smokeless tobacco: Never  Vaping Use   Vaping Use: Every day   Substances: Nicotine  Substance and Sexual Activity   Alcohol use: No   Drug use: No   Sexual activity: Yes  Other Topics Concern   Not on file  Social History Narrative   Not on file   Social Determinants of Health   Financial Resource Strain: Not on file  Food Insecurity: Not on file  Transportation Needs: Not on file  Physical Activity: Not on file  Stress: Not on file  Social Connections: Not on file  Intimate Partner Violence: Not on file  :  Review of Systems  Constitutional: Negative.   HENT: Negative.    Eyes: Negative.   Respiratory: Negative.    Cardiovascular: Negative.   Gastrointestinal: Negative.   Genitourinary: Negative.   Musculoskeletal: Negative.   Skin: Negative.   Neurological: Negative.   Endo/Heme/Allergies: Negative.    Psychiatric/Behavioral: Negative.       Exam: Vital signs show temperature of 98.7.  Pulse 75.  Blood pressure 142/90.  Weight is 168 pounds.  @IPVITALS @ Physical Exam Vitals reviewed.  HENT:     Head: Normocephalic and atraumatic.  Eyes:     Pupils: Pupils are equal, round, and reactive to light.  Cardiovascular:     Rate and Rhythm: Normal rate and regular rhythm.     Heart sounds: Normal heart sounds.  Pulmonary:     Effort: Pulmonary effort is normal.     Breath sounds: Normal breath sounds.  Abdominal:     General: Bowel sounds are normal.     Palpations: Abdomen is soft.  Musculoskeletal:        General: No tenderness or deformity. Normal range of motion.     Cervical back: Normal range of motion.  Lymphadenopathy:     Cervical: No cervical adenopathy.  Skin:    General: Skin is warm and dry.     Findings: No erythema or rash.  Neurological:     Mental Status: He is alert and oriented to person, place, and time.  Psychiatric:        Behavior: Behavior normal.        Thought Content: Thought content normal.        Judgment: Judgment normal.     Recent Labs    08/16/22 1358  WBC 10.8*  HGB 16.2  HCT 48.8  PLT 278    Recent Labs    08/16/22 1358  NA 142  K 4.4  CL 98  CO2 35*  GLUCOSE 93  BUN 12  CREATININE 1.17  CALCIUM 10.3    Blood smear review: None  Pathology: None    Assessment and Plan: [name] is a very nice 57 year old white male.  He has in all likelihood a bronchogenic carcinoma.  I would have to believe that this is good to be a nonsmall cell lung cancer.  It looks like on the PET scan, that this is going to be locally advanced-stage IIB.  I think the real question whether or not he would be a candidate for any upfront surgery.  I am unsure how bad the COPD is.  I am sure that when he sees Pulmonary Medicine they will be able to do the proper test to see what his actual lung function is.  I suppose that we could consider him  for neoadjuvant chemotherapy.  The bronchoscopy will be critical.  Hopefully, there will be able to use endobronchial ultrasound to identify the hilar lymph node.  He is in good shape otherwise.  As such, I think we can certainly be aggressive with him.  I would like to get an MRI of the brain just to complete the staging.  I think this would be very reasonable.  I am not sure what to make of this right upper lobe nodule.  As a very, very low SUV.  I am not sure that this would be a problem if he were considered for surgery.  For right now, we will have to await the biopsy review results.  Will have to await the MRI.  We will have to see what Pulmonary Medicine has to say about his underlying lung function.

## 2022-08-19 NOTE — H&P (View-Only) (Signed)
Synopsis: Referred for LLL mass by [name], NP  Subjective:   PATIENT ID: Brandon Phillips GENDER: male DOB: September 13, 1965, MRN: [id number]  Chief Complaint  Patient presents with   Consult    Referred by PCP for history of lung cancer and COPD. Increased SOB with activity that started years ago.    56yM with history of 66 y 2.5ppd smoking referred for LLL mass, pet avid hilar lad, RUL 70mm nodule low level metabolic activity  He says that he has COPD and did screening CT Chest and then PET/CT. He does have chronic cough. No hemoptysis. He has been on symbicort for about a year but doesn't notice much effect from it. He hasn't needed prednisone ever for trouble breathing.   Otherwise pertinent review of systems is negative.  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 No longer smoking cigarettes. Working his way down with vaping.   Past Medical History:  Diagnosis Date   CAP (community acquired pneumonia)    Eczema    Tobacco use disorder 01/24/2017     No family history on file.   Past Surgical History:  Procedure Laterality Date   HERNIA REPAIR     as an infant    Social History   Socioeconomic History   Marital status: Married    Spouse name: Not on file   Number of children: Not on file   Years of education: Not on file   Highest education level: Not on file  Occupational History   Not on file  Tobacco Use   Smoking status: Former    Packs/day: 1.50    Years: 35.00    Total pack years: 52.50    Types: Cigarettes    Quit date: 03/30/2017    Years since quitting: 5.3   Smokeless tobacco: Never  Vaping Use   Vaping Use: Every day   Substances: Nicotine  Substance and Sexual Activity   Alcohol use: No   Drug use: No   Sexual activity: Yes  Other Topics Concern   Not on file  Social History Narrative   Not on file   Social Determinants of Health   Financial Resource Strain: Not on file   Food Insecurity: Not on file  Transportation Needs: Not on file  Physical Activity: Not on file  Stress: Not on file  Social Connections: Not on file  Intimate Partner Violence: Not on file     No Known Allergies   Outpatient Medications Prior to Visit  Medication Sig Dispense Refill   albuterol (VENTOLIN HFA) 108 (90 Base) MCG/ACT inhaler Inhale 2 puffs into the lungs every 6 (six) hours as needed for wheezing. 2 each 11   atorvastatin (LIPITOR) 20 MG tablet TAKE ONE TABLET BY MOUTH DAILY 90 tablet 3   BABY ASPIRIN PO Take by mouth.     budesonide-formoterol (SYMBICORT) 160-4.5 MCG/ACT inhaler Inhale 2 puffs into the lungs 2 (two) times daily. 10.2 g 6   co-enzyme Q-10 30 MG capsule Take 30 mg by mouth daily.     Multiple Vitamin (MULTIVITAMIN WITH MINERALS) TABS tablet Take 1 tablet by mouth daily.     No facility-administered medications prior to visit.       Objective:   Physical Exam:  General appearance: 57 y.o., male, NAD, conversant  Eyes: anicteric sclerae; PERRL, tracking appropriately HENT: NCAT; MMM Neck: Trachea midline; no lymphadenopathy, no JVD Lungs: CTAB, no crackles, no wheeze, with normal respiratory effort CV: RRR, no  murmur  Abdomen: Soft, non-tender; non-distended, BS present  Extremities: No peripheral edema, warm Skin: Normal turgor and texture; no rash Psych: Appropriate affect Neuro: Alert and oriented to person and place, no focal deficit     Vitals:   08/20/22 1436  BP: 136/80  Pulse: 78  SpO2: 98%  Weight: 162 lb (73.5 kg)  Height: 5' 5.75" (1.67 m)   98% on RA BMI Readings from Last 3 Encounters:  08/20/22 26.35 kg/m  08/16/22 27.34 kg/m  07/08/22 28.29 kg/m   Wt Readings from Last 3 Encounters:  08/20/22 162 lb (73.5 kg)  08/16/22 168 lb 1.3 oz (76.2 kg)  07/08/22 170 lb (77.1 kg)     CBC    Component Value Date/Time   WBC 10.8 (H) 08/16/2022 1358   WBC 9.4 01/11/2022 0000   RBC 5.50 08/16/2022 1358   HGB 16.2  08/16/2022 1358   HCT 48.8 08/16/2022 1358   PLT 278 08/16/2022 1358   MCV 88.7 08/16/2022 1358   MCH 29.5 08/16/2022 1358   MCHC 33.2 08/16/2022 1358   RDW 13.2 08/16/2022 1358   LYMPHSABS 2.0 08/16/2022 1358   MONOABS 0.8 08/16/2022 1358   EOSABS 0.1 08/16/2022 1358   BASOSABS 0.1 08/16/2022 1358     Chest Imaging: PET/CT 08/14/22 with hypermetabolic LLL mass, low level hypermetabolism SUV 1.6 RUL 53mm nodule, posterior left hilar hypermetabolism  Pulmonary Functions Testing Results:     No data to display             Assessment & Plan:   # LLL mass # Left hilar pet avid LN # RUL weakly avid 6mm nodule  # History of smoking  # Reported history of COPD  Plan: - Symbicort 2 puffs twice daily rinse mouth after use - we will try to schedule navigation bronchoscopy and EBUS for 2/1.His most recent low dose CT Chest 1/2 appears to have sufficient resolution for navigation but will need to confirm we can have this pushed to our PACS.  - you will be called to schedule PFTs (pulmonary function tests) at [facility] - tentative follow up in 4 weeks but we may need to move this date aroudn pending results     [name], MD [facility] 08/20/2022 3:11 PM

## 2022-08-19 NOTE — Progress Notes (Signed)
Synopsis: Referred for LLL mass by [name], NP  Subjective:   PATIENT ID: Brandon Phillips GENDER: male DOB: 04-04-1966, MRN: [id number]  Chief Complaint  Patient presents with   Consult    Referred by PCP for history of lung cancer and COPD. Increased SOB with activity that started years ago.    56yM with history of 23 y 2.5ppd smoking referred for LLL mass, pet avid hilar lad, RUL 83mm nodule low level metabolic activity  He says that he has COPD and did screening CT Chest and then PET/CT. He does have chronic cough. No hemoptysis. He has been on symbicort for about a year but doesn't notice much effect from it. He hasn't needed prednisone ever for trouble breathing.   Otherwise pertinent review of systems is negative.  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 No longer smoking cigarettes. Working his way down with vaping.   Past Medical History:  Diagnosis Date   CAP (community acquired pneumonia)    Eczema    Tobacco use disorder 01/24/2017     No family history on file.   Past Surgical History:  Procedure Laterality Date   HERNIA REPAIR     as an infant    Social History   Socioeconomic History   Marital status: Married    Spouse name: Not on file   Number of children: Not on file   Years of education: Not on file   Highest education level: Not on file  Occupational History   Not on file  Tobacco Use   Smoking status: Former    Packs/day: 1.50    Years: 35.00    Total pack years: 52.50    Types: Cigarettes    Quit date: 03/30/2017    Years since quitting: 5.3   Smokeless tobacco: Never  Vaping Use   Vaping Use: Every day   Substances: Nicotine  Substance and Sexual Activity   Alcohol use: No   Drug use: No   Sexual activity: Yes  Other Topics Concern   Not on file  Social History Narrative   Not on file   Social Determinants of Health   Financial Resource Strain: Not on file   Food Insecurity: Not on file  Transportation Needs: Not on file  Physical Activity: Not on file  Stress: Not on file  Social Connections: Not on file  Intimate Partner Violence: Not on file     No Known Allergies   Outpatient Medications Prior to Visit  Medication Sig Dispense Refill   albuterol (VENTOLIN HFA) 108 (90 Base) MCG/ACT inhaler Inhale 2 puffs into the lungs every 6 (six) hours as needed for wheezing. 2 each 11   atorvastatin (LIPITOR) 20 MG tablet TAKE ONE TABLET BY MOUTH DAILY 90 tablet 3   BABY ASPIRIN PO Take by mouth.     budesonide-formoterol (SYMBICORT) 160-4.5 MCG/ACT inhaler Inhale 2 puffs into the lungs 2 (two) times daily. 10.2 g 6   co-enzyme Q-10 30 MG capsule Take 30 mg by mouth daily.     Multiple Vitamin (MULTIVITAMIN WITH MINERALS) TABS tablet Take 1 tablet by mouth daily.     No facility-administered medications prior to visit.       Objective:   Physical Exam:  General appearance: 57 y.o., male, NAD, conversant  Eyes: anicteric sclerae; PERRL, tracking appropriately HENT: NCAT; MMM Neck: Trachea midline; no lymphadenopathy, no JVD Lungs: CTAB, no crackles, no wheeze, with normal respiratory effort CV: RRR, no  murmur  Abdomen: Soft, non-tender; non-distended, BS present  Extremities: No peripheral edema, warm Skin: Normal turgor and texture; no rash Psych: Appropriate affect Neuro: Alert and oriented to person and place, no focal deficit     Vitals:   08/20/22 1436  BP: 136/80  Pulse: 78  SpO2: 98%  Weight: 162 lb (73.5 kg)  Height: 5' 5.75" (1.67 m)   98% on RA BMI Readings from Last 3 Encounters:  08/20/22 26.35 kg/m  08/16/22 27.34 kg/m  07/08/22 28.29 kg/m   Wt Readings from Last 3 Encounters:  08/20/22 162 lb (73.5 kg)  08/16/22 168 lb 1.3 oz (76.2 kg)  07/08/22 170 lb (77.1 kg)     CBC    Component Value Date/Time   WBC 10.8 (H) 08/16/2022 1358   WBC 9.4 01/11/2022 0000   RBC 5.50 08/16/2022 1358   HGB 16.2  08/16/2022 1358   HCT 48.8 08/16/2022 1358   PLT 278 08/16/2022 1358   MCV 88.7 08/16/2022 1358   MCH 29.5 08/16/2022 1358   MCHC 33.2 08/16/2022 1358   RDW 13.2 08/16/2022 1358   LYMPHSABS 2.0 08/16/2022 1358   MONOABS 0.8 08/16/2022 1358   EOSABS 0.1 08/16/2022 1358   BASOSABS 0.1 08/16/2022 1358     Chest Imaging: PET/CT 08/14/22 with hypermetabolic LLL mass, low level hypermetabolism SUV 1.6 RUL 28mm nodule, posterior left hilar hypermetabolism  Pulmonary Functions Testing Results:     No data to display             Assessment & Plan:   # LLL mass # Left hilar pet avid LN # RUL weakly avid 12mm nodule  # History of smoking  # Reported history of COPD  Plan: - Symbicort 2 puffs twice daily rinse mouth after use - we will try to schedule navigation bronchoscopy and EBUS for 2/1.His most recent low dose CT Chest 1/2 appears to have sufficient resolution for navigation but will need to confirm we can have this pushed to our PACS.  - you will be called to schedule PFTs (pulmonary function tests) at [facility] - tentative follow up in 4 weeks but we may need to move this date aroudn pending results     [name], MD [facility] 08/20/2022 3:11 PM

## 2022-08-20 ENCOUNTER — Telehealth: Payer: Self-pay | Admitting: Student

## 2022-08-20 ENCOUNTER — Encounter: Payer: Self-pay | Admitting: Student

## 2022-08-20 ENCOUNTER — Ambulatory Visit (INDEPENDENT_AMBULATORY_CARE_PROVIDER_SITE_OTHER): Payer: BC Managed Care – PPO | Admitting: Student

## 2022-08-20 ENCOUNTER — Encounter: Payer: Self-pay | Admitting: *Deleted

## 2022-08-20 VITALS — BP 136/80 | HR 78 | Ht 65.75 in | Wt 162.0 lb

## 2022-08-20 DIAGNOSIS — R0600 Dyspnea, unspecified: Secondary | ICD-10-CM

## 2022-08-20 DIAGNOSIS — R918 Other nonspecific abnormal finding of lung field: Secondary | ICD-10-CM

## 2022-08-20 NOTE — Telephone Encounter (Signed)
This has been scheduled.  Info is in referral.  Will close message.

## 2022-08-20 NOTE — Patient Instructions (Addendum)
-  Symbicort 2 puffs twice daily rinse mouth after use - we will try to schedule bronchoscopy for 2/1. Nothing to eat or drink after midnight the night before it. No aspirin for 5 days before it. Need a ride home day of procedure.  - you will be called to schedule PFTs (pulmonary function tests) at [facility] - tentative follow up in 4 weeks but we may need to move this date aroudn pending results

## 2022-08-20 NOTE — Progress Notes (Signed)
Patient needs an MRI which is scheduled for 08/24/22. Otherwise his plan will be made once tissue diagnosis is obtained. His appointment with pulmonary is this afternoon to discuss bronch.   Oncology Nurse Navigator Documentation     08/20/2022   10:15 AM  Oncology Nurse Navigator Flowsheets  Navigator Follow Up Date: 08/21/2022  Navigator Follow Up Reason: Review Note  Navigator Location [facility]  Navigator Encounter Type Appt/Treatment Plan Review  Patient Visit Type MedOnc  Treatment Phase Abnormal Scans  Barriers/Navigation Needs Coordination of Care;Education  Interventions None Required  Acuity Level 2-Minimal Needs (1-2 Barriers Identified)  Support Groups/Services Friends and Family  Time Spent with Patient 15

## 2022-08-21 ENCOUNTER — Encounter: Payer: Self-pay | Admitting: *Deleted

## 2022-08-21 NOTE — Progress Notes (Signed)
Patient seen by pulmonary and scheduled for bronch on 08/29/2022. Dr Marin Olp notified.   Oncology Nurse Navigator Documentation     08/21/2022    9:00 AM  Oncology Nurse Navigator Flowsheets  Navigator Follow Up Date: 08/26/2022  Navigator Follow Up Reason: Surgery;Scan Review  Navigator Location [facility]  Navigator Encounter Type Appt/Treatment Plan Review  Patient Visit Type MedOnc  Treatment Phase Abnormal Scans  Barriers/Navigation Needs Coordination of Care;Education  Interventions None Required  Acuity Level 2-Minimal Needs (1-2 Barriers Identified)  Support Groups/Services Friends and Family  Time Spent with Patient 15

## 2022-08-24 ENCOUNTER — Ambulatory Visit (HOSPITAL_COMMUNITY)
Admission: RE | Admit: 2022-08-24 | Discharge: 2022-08-24 | Disposition: A | Discharge status: Home / Self Care | Payer: BC Managed Care – PPO | Source: Ambulatory Visit | Attending: Hematology & Oncology | Admitting: Hematology & Oncology

## 2022-08-24 DIAGNOSIS — C3490 Malignant neoplasm of unspecified part of unspecified bronchus or lung: Secondary | ICD-10-CM | POA: Insufficient documentation

## 2022-08-24 MED ORDER — GADOBUTROL 1 MMOL/ML IV SOLN
7.0000 mL | Freq: Once | INTRAVENOUS | Status: AC | PRN
  Administered 2022-08-24: 7 mL via INTRAVENOUS

## 2022-08-26 ENCOUNTER — Ambulatory Visit (INDEPENDENT_AMBULATORY_CARE_PROVIDER_SITE_OTHER): Payer: BC Managed Care – PPO | Admitting: Student

## 2022-08-26 ENCOUNTER — Other Ambulatory Visit: Payer: Self-pay

## 2022-08-26 ENCOUNTER — Other Ambulatory Visit: Payer: BC Managed Care – PPO

## 2022-08-26 DIAGNOSIS — R058 Other specified cough: Secondary | ICD-10-CM

## 2022-08-26 DIAGNOSIS — R918 Other nonspecific abnormal finding of lung field: Secondary | ICD-10-CM

## 2022-08-26 DIAGNOSIS — R0600 Dyspnea, unspecified: Secondary | ICD-10-CM

## 2022-08-26 LAB — PULMONARY FUNCTION TEST
DL/VA % pred: 60 %
DL/VA: 2.7 ml/min/mmHg/L
DLCO cor % pred: 57 %
DLCO cor: 14.04 ml/min/mmHg
DLCO unc % pred: 61 %
DLCO unc: 15.07 ml/min/mmHg
FEF 25-75 Post: 0.55 L/sec
FEF 25-75 Pre: 0.55 L/sec
FEF2575-%Change-Post: 1 %
FEF2575-%Pred-Post: 20 %
FEF2575-%Pred-Pre: 19 %
FEV1-%Change-Post: 0 %
FEV1-%Pred-Post: 41 %
FEV1-%Pred-Pre: 42 %
FEV1-Post: 1.33 L
FEV1-Pre: 1.33 L
FEV1FVC-%Change-Post: -1 %
FEV1FVC-%Pred-Pre: 63 %
FEV6-%Change-Post: 0 %
FEV6-%Pred-Post: 66 %
FEV6-%Pred-Pre: 66 %
FEV6-Post: 2.63 L
FEV6-Pre: 2.61 L
FEV6FVC-%Change-Post: 0 %
FEV6FVC-%Pred-Post: 99 %
FEV6FVC-%Pred-Pre: 99 %
FVC-%Change-Post: 0 %
FVC-%Pred-Post: 67 %
FVC-%Pred-Pre: 66 %
FVC-Post: 2.77 L
FVC-Pre: 2.75 L
Post FEV1/FVC ratio: 48 %
Post FEV6/FVC ratio: 95 %
Pre FEV1/FVC ratio: 48 %
Pre FEV6/FVC Ratio: 95 %
RV % pred: 235 %
RV: 4.39 L
TLC % pred: 122 %
TLC: 7.3 L

## 2022-08-26 NOTE — Patient Instructions (Signed)
Full PFT Performed Today  

## 2022-08-26 NOTE — Progress Notes (Signed)
Full PFT Performed Today  

## 2022-08-27 ENCOUNTER — Encounter: Payer: Self-pay | Admitting: *Deleted

## 2022-08-27 NOTE — Progress Notes (Signed)
Brandon Napoleon, MD  P Onc Nurse Hp Call and let him know that the MRI of the brain looks fantastic.  Nothing up there but brain which is a good thing.  Thanks.  [employer] and spoke to patient's wife, Brandon Phillips. She is aware of results.   Oncology Nurse Navigator Documentation     08/27/2022    9:00 AM  Oncology Nurse Navigator Flowsheets  Navigator Follow Up Date: 08/29/2022  Navigator Follow Up Reason: Surgery  Navigator Location [facility]  Navigator Encounter Type Scan Review;Telephone  Telephone Diagnostic Results;Outgoing Call  Patient Visit Type MedOnc  Treatment Phase Abnormal Scans  Barriers/Navigation Needs Coordination of Care;Education  Education Other  Interventions Education;Psycho-Social Support  Acuity Level 2-Minimal Needs (1-2 Barriers Identified)  Education Method Verbal  Support Groups/Services Friends and Family  Time Spent with Patient 15

## 2022-08-28 ENCOUNTER — Other Ambulatory Visit: Payer: Self-pay

## 2022-08-28 ENCOUNTER — Encounter (HOSPITAL_COMMUNITY): Payer: Self-pay | Admitting: Student

## 2022-08-28 LAB — SPECIMEN STATUS REPORT

## 2022-08-28 LAB — NOVEL CORONAVIRUS, NAA: SARS-CoV-2, NAA: NOT DETECTED

## 2022-08-28 NOTE — Pre-Procedure Instructions (Signed)
PCP - Dr.Joy Sports coach - pt denies  PPM/ICD - pt deneis Device Orders - n/a Rep Notified - n/a  EKG - pt denies Stress Test - pt denies ECHO - pt denies Cardiac Cath - pt denies  Sleep Study/CPAP - pt denies  Diabetic- pt denies Fasting Blood Sugar -  Checks Blood Sugar _____ times a day  Last dose of GLP1 agonist-  n/a GLP1 instructions: n/a  Blood Thinner Instructions:pt denies Aspirin Instructions:pt states he was told to hold Aspirin 5 days prior to surgery  ERAS Protcol - NPO after Midnight   COVID TEST- YES, completed on 08/26/22-negative results   Anesthesia review: NO   Patient verbally denies any shortness of breath, fever, cough and chest pain during phone call.     -------------  SDW INSTRUCTIONS given:   Your procedure is scheduled on 08/29/22.             Report to [facility] Main Entrance "A" at  6:45  A.M., and check in at the Admitting office.             Call this number if you have problems the morning of surgery:             [phone]               Remember:             Do not eat or drink after midnight the night before your surgery                          Take these medicines the morning of surgery with A SIP OF WATER atorvastatin (LIPITOR),GUAIFENESIN,albuterol (VENTOLIN HFA),budesonide-formoterol (SYMBICORT)  As of today, Aleve, Naproxen, Ibuprofen, Motrin, Advil, Goody's, BC's, all herbal medications, fish oil, and all vitamins.  Patient states he was told by surgeon to hold Aspirin 5 days prior to surgery                      Do not wear jewelry, make up, or nail polish            Do not wear lotions, powders, perfumes/colognes, or deodorant.            Do not shave 48 hours prior to surgery.  Men may shave face and neck.            Do not bring valuables to the hospital.            [facility] is not responsible for any belongings or valuables.   Do NOT Smoke (Tobacco/Vaping) 24 hours prior to your procedure If you use a  CPAP at night, you may bring all equipment for your overnight stay.   Contacts, glasses, dentures or partials may not be worn into surgery.      For patients admitted to the hospital, discharge time will be determined by your treatment team.   Patients discharged the day of surgery will not be allowed to drive home, and someone needs to stay with them for 24 hours.     Special instructions:   [place]- Preparing For Surgery  Oral Hygiene is also important to reduce your risk of infection.  Remember - BRUSH YOUR TEETH THE MORNING OF SURGERY WITH YOUR REGULAR TOOTHPASTE   Before surgery, you can play an important role. Because skin is not sterile, your skin needs to be as free of germs as possible. You can reduce the  number of germs on your skin by washing with Dial (or any antibacterial) Soap before surgery.    Please do not use if you have an allergy to antibacterial soaps. If your skin becomes reddened/irritated stop using the Antibacterial Soap  Do not shave (including legs and underarms) for at least 48 hours prior to surgery. It is OK to shave your face.   Please follow these instructions carefully.              Shower the NIGHT BEFORE SURGERY and the MORNING OF SURGERY with (DIAL) Antibacterial Soap. Wash your body and hair with your normal shampoo/soap then rinse. Using a clean wash cloth wash from your neck down using the antibacterial soap, do not wash private area with the clean wash cloth. Wash thoroughly, paying special attention to the area where your surgery will be performed. Thoroughly rinse your body with warm water from the neck down.   Pat yourself dry with a CLEAN TOWEL.   Wear CLEAN PAJAMAS to bed the night before surgery.   Place CLEAN SHEETS on your bed the night of your surgery and DO NOT SLEEP WITH PETS.     Day of Surgery: Please shower morning of surgery using antibacterial soap Wear Clean/Comfortable clothing the morning of surgery Do not apply any  deodorants/lotions.   Remember to brush your teeth WITH YOUR REGULAR TOOTHPASTE.   Questions were answered. Patient verbalized understanding of instructions.

## 2022-08-29 ENCOUNTER — Encounter: Payer: Self-pay | Admitting: *Deleted

## 2022-08-29 ENCOUNTER — Ambulatory Visit (HOSPITAL_COMMUNITY): Payer: BC Managed Care – PPO | Admitting: Anesthesiology

## 2022-08-29 ENCOUNTER — Encounter (HOSPITAL_COMMUNITY): Payer: Self-pay | Admitting: Student

## 2022-08-29 ENCOUNTER — Ambulatory Visit (HOSPITAL_COMMUNITY): Payer: BC Managed Care – PPO

## 2022-08-29 ENCOUNTER — Encounter (HOSPITAL_COMMUNITY)
Admission: RE | Disposition: A | Discharge status: Home / Self Care | Payer: Self-pay | Source: Home / Self Care | Attending: Student

## 2022-08-29 ENCOUNTER — Ambulatory Visit (HOSPITAL_COMMUNITY)
Admission: RE | Admit: 2022-08-29 | Discharge: 2022-08-29 | Disposition: A | Discharge status: Home / Self Care | Payer: BC Managed Care – PPO | Attending: Student | Admitting: Student

## 2022-08-29 DIAGNOSIS — I889 Nonspecific lymphadenitis, unspecified: Secondary | ICD-10-CM | POA: Diagnosis not present

## 2022-08-29 DIAGNOSIS — J449 Chronic obstructive pulmonary disease, unspecified: Secondary | ICD-10-CM | POA: Diagnosis not present

## 2022-08-29 DIAGNOSIS — Z87891 Personal history of nicotine dependence: Secondary | ICD-10-CM | POA: Diagnosis not present

## 2022-08-29 DIAGNOSIS — R918 Other nonspecific abnormal finding of lung field: Secondary | ICD-10-CM | POA: Diagnosis not present

## 2022-08-29 DIAGNOSIS — J189 Pneumonia, unspecified organism: Secondary | ICD-10-CM | POA: Diagnosis not present

## 2022-08-29 DIAGNOSIS — Z79899 Other long term (current) drug therapy: Secondary | ICD-10-CM | POA: Insufficient documentation

## 2022-08-29 DIAGNOSIS — I10 Essential (primary) hypertension: Secondary | ICD-10-CM | POA: Diagnosis not present

## 2022-08-29 DIAGNOSIS — J8410 Pulmonary fibrosis, unspecified: Secondary | ICD-10-CM | POA: Insufficient documentation

## 2022-08-29 DIAGNOSIS — C3432 Malignant neoplasm of lower lobe, left bronchus or lung: Secondary | ICD-10-CM | POA: Insufficient documentation

## 2022-08-29 DIAGNOSIS — R911 Solitary pulmonary nodule: Secondary | ICD-10-CM | POA: Diagnosis not present

## 2022-08-29 DIAGNOSIS — Z9889 Other specified postprocedural states: Secondary | ICD-10-CM | POA: Diagnosis not present

## 2022-08-29 DIAGNOSIS — J984 Other disorders of lung: Secondary | ICD-10-CM | POA: Diagnosis not present

## 2022-08-29 HISTORY — DX: Hyperlipidemia, unspecified: E78.5

## 2022-08-29 HISTORY — PX: FIDUCIAL MARKER PLACEMENT: SHX6858

## 2022-08-29 HISTORY — PX: BRONCHIAL WASHINGS: SHX5105

## 2022-08-29 HISTORY — PX: BRONCHIAL BRUSHINGS: SHX5108

## 2022-08-29 HISTORY — DX: Malignant (primary) neoplasm, unspecified: C80.1

## 2022-08-29 HISTORY — PX: BRONCHIAL NEEDLE ASPIRATION BIOPSY: SHX5106

## 2022-08-29 HISTORY — DX: Chronic obstructive pulmonary disease, unspecified: J44.9

## 2022-08-29 HISTORY — PX: VIDEO BRONCHOSCOPY WITH ENDOBRONCHIAL ULTRASOUND: SHX6177

## 2022-08-29 HISTORY — PX: BRONCHIAL BIOPSY: SHX5109

## 2022-08-29 SURGERY — BRONCHOSCOPY, WITH BIOPSY USING ELECTROMAGNETIC NAVIGATION
Anesthesia: General

## 2022-08-29 MED ORDER — ONDANSETRON HCL 4 MG/2ML IJ SOLN
INTRAMUSCULAR | Status: DC | PRN
  Administered 2022-08-29: 4 mg via INTRAVENOUS

## 2022-08-29 MED ORDER — ROCURONIUM BROMIDE 10 MG/ML (PF) SYRINGE
PREFILLED_SYRINGE | INTRAVENOUS | Status: DC | PRN
  Administered 2022-08-29: 70 mg via INTRAVENOUS

## 2022-08-29 MED ORDER — PROPOFOL 10 MG/ML IV BOLUS
INTRAVENOUS | Status: DC | PRN
  Administered 2022-08-29: 150 mg via INTRAVENOUS

## 2022-08-29 MED ORDER — LIDOCAINE 2% (20 MG/ML) 5 ML SYRINGE
INTRAMUSCULAR | Status: DC | PRN
  Administered 2022-08-29: 80 mg via INTRAVENOUS

## 2022-08-29 MED ORDER — PHENYLEPHRINE 80 MCG/ML (10ML) SYRINGE FOR IV PUSH (FOR BLOOD PRESSURE SUPPORT)
PREFILLED_SYRINGE | INTRAVENOUS | Status: DC | PRN
  Administered 2022-08-29 (×4): 160 ug via INTRAVENOUS
  Administered 2022-08-29: 80 ug via INTRAVENOUS

## 2022-08-29 MED ORDER — LACTATED RINGERS IV SOLN: INTRAVENOUS | Status: DC

## 2022-08-29 MED ORDER — CHLORHEXIDINE GLUCONATE 0.12 % MT SOLN
15.0000 mL | Freq: Once | OROMUCOSAL | Status: AC
  Administered 2022-08-29: 15 mL via OROMUCOSAL
  Filled 2022-08-29 (×2): qty 15

## 2022-08-29 MED ORDER — OXYCODONE HCL 5 MG PO TABS: 5.0000 mg | ORAL_TABLET | Freq: Once | ORAL | Status: DC | PRN

## 2022-08-29 MED ORDER — HYDROMORPHONE HCL 1 MG/ML IJ SOLN: 0.2500 mg | INTRAMUSCULAR | Status: DC | PRN

## 2022-08-29 MED ORDER — FENTANYL CITRATE (PF) 100 MCG/2ML IJ SOLN
INTRAMUSCULAR | Status: DC | PRN
  Administered 2022-08-29: 100 ug via INTRAVENOUS

## 2022-08-29 MED ORDER — AMISULPRIDE (ANTIEMETIC) 5 MG/2ML IV SOLN: 10.0000 mg | Freq: Once | INTRAVENOUS | Status: DC | PRN

## 2022-08-29 MED ORDER — PROPOFOL 500 MG/50ML IV EMUL
INTRAVENOUS | Status: DC | PRN
  Administered 2022-08-29: 150 ug/kg/min via INTRAVENOUS

## 2022-08-29 MED ORDER — OXYCODONE HCL 5 MG/5ML PO SOLN: 5.0000 mg | Freq: Once | ORAL | Status: DC | PRN

## 2022-08-29 MED ORDER — PROMETHAZINE HCL 25 MG/ML IJ SOLN: 6.2500 mg | INTRAMUSCULAR | Status: DC | PRN

## 2022-08-29 MED ORDER — SUGAMMADEX SODIUM 200 MG/2ML IV SOLN
INTRAVENOUS | Status: DC | PRN
  Administered 2022-08-29: 200 mg via INTRAVENOUS

## 2022-08-29 MED ORDER — MIDAZOLAM HCL 2 MG/2ML IJ SOLN
INTRAMUSCULAR | Status: DC | PRN
  Administered 2022-08-29: 2 mg via INTRAVENOUS

## 2022-08-29 MED ORDER — DEXAMETHASONE SODIUM PHOSPHATE 10 MG/ML IJ SOLN
INTRAMUSCULAR | Status: DC | PRN
  Administered 2022-08-29: 10 mg via INTRAVENOUS

## 2022-08-29 SURGICAL SUPPLY — 2 items
SuperLock Fiducial Marker IMPLANT
superlock fiducial marker IMPLANT

## 2022-08-29 NOTE — Progress Notes (Signed)
Patient has technically successful bronch. Will follow for path results.   Oncology Nurse Navigator Documentation     08/29/2022   11:30 AM  Oncology Nurse Navigator Flowsheets  Navigator Follow Up Date: 09/03/2022  Navigator Follow Up Reason: Pathology  Navigator Location [facility]  Navigator Encounter Type Appt/Treatment Plan Review  Patient Visit Type MedOnc  Treatment Phase Abnormal Scans  Barriers/Navigation Needs Coordination of Care;Education  Interventions None Required  Acuity Level 2-Minimal Needs (1-2 Barriers Identified)  Support Groups/Services Friends and Family  Time Spent with Patient 15

## 2022-08-29 NOTE — Anesthesia Preprocedure Evaluation (Signed)
Anesthesia Evaluation  Patient identified by MRN, date of birth, ID band Patient awake    Reviewed: Allergy & Precautions, H&P , NPO status , Patient's Chart, lab work & pertinent test results  Airway Mallampati: II  TM Distance: >3 FB Neck ROM: Full    Dental no notable dental hx.    Pulmonary COPD, former smoker   Pulmonary exam normal breath sounds clear to auscultation       Cardiovascular hypertension, Pt. on medications Normal cardiovascular exam Rhythm:Regular Rate:Normal     Neuro/Psych negative neurological ROS  negative psych ROS   GI/Hepatic negative GI ROS, Neg liver ROS,,,  Endo/Other  negative endocrine ROS    Renal/GU negative Renal ROS  negative genitourinary   Musculoskeletal negative musculoskeletal ROS (+)    Abdominal   Peds negative pediatric ROS (+)  Hematology negative hematology ROS (+)   Anesthesia Other Findings   Reproductive/Obstetrics negative OB ROS                             Anesthesia Physical Anesthesia Plan  ASA: 3  Anesthesia Plan: General   Post-op Pain Management: Minimal or no pain anticipated   Induction: Intravenous  PONV Risk Score and Plan: 2 and Ondansetron, Midazolam and Treatment may vary due to age or medical condition  Airway Management Planned: Oral ETT  Additional Equipment:   Intra-op Plan:   Post-operative Plan: Extubation in OR  Informed Consent: I have reviewed the patients History and Physical, chart, labs and discussed the procedure including the risks, benefits and alternatives for the proposed anesthesia with the patient or authorized representative who has indicated his/her understanding and acceptance.     Dental advisory given  Plan Discussed with: CRNA  Anesthesia Plan Comments:        Anesthesia Quick Evaluation

## 2022-08-29 NOTE — Anesthesia Procedure Notes (Signed)
Procedure Name: Intubation Date/Time: 08/29/2022 9:31 AM  Performed by: Genelle Bal, CRNAPre-anesthesia Checklist: Patient identified, Emergency Drugs available, Suction available and Patient being monitored Patient Re-evaluated:Patient Re-evaluated prior to induction Oxygen Delivery Method: Circle system utilized Preoxygenation: Pre-oxygenation with 100% oxygen Induction Type: IV induction Ventilation: Mask ventilation without difficulty Laryngoscope Size: Mac and 4 Grade View: Grade I Tube type: Oral Tube size: 8.5 mm Number of attempts: 1 Airway Equipment and Method: Stylet and Oral airway Placement Confirmation: ETT inserted through vocal cords under direct vision, positive ETCO2 and breath sounds checked- equal and bilateral Secured at: 22 cm Tube secured with: Tape Dental Injury: Teeth and Oropharynx as per pre-operative assessment

## 2022-08-29 NOTE — Discharge Instructions (Signed)
-  Small clots of blood and bloody streaks in cough normal for first 24-72 hours post bronchoscopy. If coughing up large clots size of your thumb or bigger call our clinic [phone] and if persistent, especially if you also develop worsening trouble breathing then would plan to head to the emergency department.  - OK to restart aspirin 81 mg daily tomorrow as long as bloody cough subsiding - I'll be in touch in next 3-5 business days with results from [place]

## 2022-08-29 NOTE — Op Note (Signed)
Video Bronchoscopy with Robotic Assisted Bronchoscopic Navigation   Date of Operation: 08/29/2022   Pre-op Diagnosis: LLL mass, RUL pulmonary nodule  Post-op Diagnosis: LLL mass, RUL pulmonary nodule  Surgeon: Walker Shadow  Anesthesia: General endotracheal anesthesia  Operation: Flexible video fiberoptic bronchoscopy with robotic assistance and biopsies.  Estimated Blood Loss: Minimal  Complications: None  Indications and History: Brandon Phillips is a 57 y.o. male with history of LLL mass, RUL pulmonary nodule. The risks, benefits, complications, treatment options and expected outcomes were discussed with the patient.  The possibilities of pneumothorax, pneumonia, reaction to medication, pulmonary aspiration, perforation of a viscus, bleeding, failure to diagnose a condition and creating a complication requiring transfusion or operation were discussed with the patient who freely signed the consent.    Description of Procedure: The patient was seen in the Preoperative Area, was examined and was deemed appropriate to proceed.  The patient was taken to [facility] endoscopy room 3, identified as Kate Sable and the procedure verified as Flexible Video Fiberoptic Bronchoscopy.  A Time Out was held and the above information confirmed.   Prior to the date of the procedure a high-resolution CT scan of the chest was performed. Utilizing ION software program a virtual tracheobronchial tree was generated to allow the creation of distinct navigation pathways to the patient's parenchymal abnormalities. After being taken to the operating room general anesthesia was initiated and the patient  was orally intubated. The video fiberoptic bronchoscope was introduced via the endotracheal tube and a general inspection was performed which showed normal right and left lung anatomy, aspiration of the bilateral mainstems was completed to remove any remaining secretions. Robotic catheter inserted into patient's endotracheal  tube.   Target #1 LLL mass: The distinct navigation pathways prepared prior to this procedure were then utilized to navigate to patient's lesion identified on CT scan. CIOS imaging was used to aid navigation and confirm ideal location for biopsy. The robotic catheter was secured into place and the vision probe was withdrawn.  Lesion location was approximated using fluoroscopy. Under fluoroscopic guidance transbronchial needle brushings, transbronchial needle biopsies, and transbronchial forceps biopsies were performed to be sent for cytology and pathology. A fiducial marker was left in place.  Target #2 RUL nodule: The distinct navigation pathways prepared prior to this procedure were then utilized to navigate to patient's lesion identified on CT scan. CIOS imaging was used to aid navigation and confirm ideal location for biopsy. The robotic catheter was secured into place and the vision probe was withdrawn.  Lesion location was approximated using fluoroscopy. Under fluoroscopic guidance transbronchial needle brushings, transbronchial needle biopsies, and transbronchial forceps biopsies were performed to be sent for cytology and pathology. A fiducial marker was left in place. A bronchioalveolar lavage was performed in the RUL and sent for cytology.  EBUS: Ion scope disconnected and convex probe EBUS scope inserted, mediastinal and hilar lymph nodes surveyed. EBNA performed at 11L with 5 passes.  At the end of the procedure a general airway inspection was performed and there was no evidence of active bleeding. The bronchoscope was removed.  The patient tolerated the procedure well. There was no significant blood loss and there were no obvious complications. A post-procedural chest x-ray is pending.     Samples Target #1: 1. Transbronchial needle brushings from LLL mass 2. Transbronchial Wang needle biopsies from LLL mass 3. Transbronchial forceps biopsies from LLL mass 4. Bronchoalveolar lavage  from LLL mass 5. Endobronchial biopsies from LLL mass  Samples Target #2: 1. Transbronchial  needle brushings from RUL nodule 2. Transbronchial Wang needle biopsies from RUL nodule 3. Transbronchial forceps biopsies from RUL nodule 4. Bronchoalveolar lavage from RUL nodule 5. Endobronchial biopsies from RUL nodule  EBUS: EBNA with 5 passes from 11L  Plans:  The patient will be discharged from the PACU to home when recovered from anesthesia and after chest x-ray is reviewed. We will review the cytology, pathology and microbiology results with the patient when they become available. Outpatient followup will be arranged in our clinic at date TBD pending results.

## 2022-08-29 NOTE — Interval H&P Note (Signed)
History and Physical Interval Note:  08/29/2022 8:52 AM  Brandon Phillips  has presented today for surgery, with the diagnosis of LEFT LUNG MASS.  The various methods of treatment have been discussed with the patient and family. After consideration of risks, benefits and other options for treatment, the patient has consented to  Procedure(s): ROBOTIC ASSISTED NAVIGATIONAL BRONCHOSCOPY (N/A) as a surgical intervention.  The patient's history has been reviewed, patient examined, no change in status, stable for surgery.  I have reviewed the patient's chart and labs.  Questions were answered to the patient's satisfaction.     [name]

## 2022-08-29 NOTE — Transfer of Care (Signed)
Immediate Anesthesia Transfer of Care Note  Patient: Brandon Phillips  Procedure(s) Performed: ROBOTIC ASSISTED NAVIGATIONAL BRONCHOSCOPY BRONCHIAL BIOPSIES BRONCHIAL NEEDLE ASPIRATION BIOPSIES BRONCHIAL BRUSHINGS FIDUCIAL MARKER PLACEMENT VIDEO BRONCHOSCOPY WITH ENDOBRONCHIAL ULTRASOUND BRONCHIAL WASHINGS  Patient Location: PACU  Anesthesia Type:General  Level of Consciousness: awake, alert , and oriented  Airway & Oxygen Therapy: Patient Spontanous Breathing and Patient connected to face mask oxygen  Post-op Assessment: Report given to RN and Post -op Vital signs reviewed and stable  Post vital signs: Reviewed and stable  Last Vitals:  Vitals Value Taken Time  BP 122/78   Temp    Pulse 65 08/29/22 1106  Resp 14   SpO2 99 % 08/29/22 1106  Vitals shown include unvalidated device data.  Last Pain:  Vitals:   08/29/22 0712  TempSrc:   PainSc: 0-No pain         Complications: No notable events documented.

## 2022-08-29 NOTE — Anesthesia Postprocedure Evaluation (Signed)
Anesthesia Post Note  Patient: Samule Life  Procedure(s) Performed: ROBOTIC ASSISTED NAVIGATIONAL BRONCHOSCOPY BRONCHIAL BIOPSIES BRONCHIAL NEEDLE ASPIRATION BIOPSIES BRONCHIAL BRUSHINGS FIDUCIAL MARKER PLACEMENT VIDEO BRONCHOSCOPY WITH ENDOBRONCHIAL ULTRASOUND BRONCHIAL WASHINGS     Patient location during evaluation: PACU Anesthesia Type: General Level of consciousness: awake and alert Pain management: pain level controlled Vital Signs Assessment: post-procedure vital signs reviewed and stable Respiratory status: spontaneous breathing, nonlabored ventilation and respiratory function stable Cardiovascular status: blood pressure returned to baseline and stable Postop Assessment: no apparent nausea or vomiting Anesthetic complications: no   No notable events documented.  Last Vitals:  Vitals:   08/29/22 1130 08/29/22 1135  BP: 101/77 104/76  Pulse: (!) 56 60  Resp: 11 13  Temp:  36.8 C  SpO2: 95% 93%    Last Pain:  Vitals:   08/29/22 1135  TempSrc:   PainSc: 0-No pain                 Lynda Rainwater

## 2022-09-02 LAB — CYTOLOGY - NON PAP

## 2022-09-03 ENCOUNTER — Encounter: Payer: Self-pay | Admitting: *Deleted

## 2022-09-03 ENCOUNTER — Telehealth: Payer: Self-pay | Admitting: Student

## 2022-09-03 DIAGNOSIS — J984 Other disorders of lung: Secondary | ICD-10-CM

## 2022-09-03 DIAGNOSIS — C3490 Malignant neoplasm of unspecified part of unspecified bronchus or lung: Secondary | ICD-10-CM

## 2022-09-03 DIAGNOSIS — C3492 Malignant neoplasm of unspecified part of left bronchus or lung: Secondary | ICD-10-CM

## 2022-09-03 LAB — CYTOLOGY - NON PAP

## 2022-09-03 NOTE — Progress Notes (Unsigned)
Per Dr Marin Olp, request for Foundation One testing sent on specimen [id number] DOS 08/29/2022.  Dr Marin Olp would like him referred to Dr Roxan Hockey for consideration of surgical resection.   Oncology Nurse Navigator Documentation     09/03/2022   10:15 AM  Oncology Nurse Navigator Flowsheets  Confirmed Diagnosis Date 08/29/2022  Diagnosis Status Pending Molecular Studies  Navigator Follow Up Date: 09/05/2022  Navigator Follow Up Reason: [facility]  Navigator Location [facility]  Navigator Encounter Type Pathology Review  Patient Visit Type MedOnc  Treatment Phase Pre-Tx/Tx Discussion  Barriers/Navigation Needs Coordination of Care;Education  Interventions Coordination of Care;Referrals  Acuity Level 2-Minimal Needs (1-2 Barriers Identified)  Referrals Other  Coordination of Care Pathology  Support Groups/Services Friends and Family  Time Spent with Patient 30

## 2022-09-03 NOTE — Telephone Encounter (Signed)
Called and discussed bronch results with atypical cells at 11L, NSCLC LLL. Referral made to radiation oncology and I said I'd pass results along to Dr. Marin Olp as well. His functional status is great but his ventilatory defect is severe on PFT, diffusing capacity mildly reduced and tumor is lower lobe unfortunately. Not sure that he would be surgical candidate but I will see if we can discuss at next tumor board.   [place]

## 2022-09-04 NOTE — Progress Notes (Signed)
Thoracic Location of Tumor / Histology: Left Lower Lobe Lung  Patient has been followed by Pulmonary for COPD and did screening CT chest.  This revealed left lung nodules.  MRI Brain 08/24/2022: Normal examination. No evidence of metastatic disease.   PET 08/14/2022: Left lower lobe mass.  Ipsilateral hilar adenopathy. The mass left lower lobe measured 3.3 x 2.7 cm.  It has an SUV of 17.  Left hilar lymph node.  There was a 8 mm right upper lobe  CT Chest 07/25/2022: Left lower lobe mass measuring 3.5 cm.  He had a 8 mm right upper lobe nodule.      Biopsies of Left Lung 08/29/2022          Tobacco/Marijuana/Snuff/ETOH use: Current Smoker  Past/Anticipated interventions by cardiothoracic surgery, if any:  Dr. Roxan Hockey 09/11/2022    Past/Anticipated interventions by medical oncology, if any:  Dr. Marin Olp 08/16/2022 -I think the real question whether or not he would be a candidate for any upfront surgery. I am unsure how bad the COPD is. I am sure that when he sees Pulmonary Medicine they will be able to do the proper test to see what his actual lung function is.  -I suppose that we could consider him for neoadjuvant chemotherapy.  -I would like to get an MRI of the brain just to complete the staging.  I think this would be very reasonable. -I am not sure what to make of this right upper lobe nodule.  As a very, very low SUV.  I am not sure that this would be a problem if he were considered for surgery.   Signs/Symptoms Weight changes, if any: {:18581} Respiratory complaints, if any: {:18581} Hemoptysis, if any: {:18581} Pain issues, if any:  {:18581}  SAFETY ISSUES: Prior radiation? {:18581} Pacemaker/ICD? {:18581}  Possible current pregnancy? n/a Is the patient on methotrexate? {:18581}  Current Complaints / other details:

## 2022-09-05 ENCOUNTER — Encounter: Payer: Self-pay | Admitting: Radiation Oncology

## 2022-09-05 ENCOUNTER — Ambulatory Visit
Admission: RE | Admit: 2022-09-05 | Discharge: 2022-09-05 | Disposition: A | Discharge status: Home / Self Care | Payer: BC Managed Care – PPO | Source: Ambulatory Visit | Attending: Radiation Oncology | Admitting: Radiation Oncology

## 2022-09-05 ENCOUNTER — Encounter: Payer: Self-pay | Admitting: *Deleted

## 2022-09-05 ENCOUNTER — Other Ambulatory Visit: Payer: Self-pay

## 2022-09-05 VITALS — BP 144/97 | HR 96 | Temp 97.6°F | Resp 20 | Ht 65.0 in | Wt 168.0 lb

## 2022-09-05 DIAGNOSIS — C3432 Malignant neoplasm of lower lobe, left bronchus or lung: Secondary | ICD-10-CM | POA: Diagnosis not present

## 2022-09-05 DIAGNOSIS — Z79899 Other long term (current) drug therapy: Secondary | ICD-10-CM | POA: Insufficient documentation

## 2022-09-05 DIAGNOSIS — J449 Chronic obstructive pulmonary disease, unspecified: Secondary | ICD-10-CM | POA: Insufficient documentation

## 2022-09-05 DIAGNOSIS — Z7982 Long term (current) use of aspirin: Secondary | ICD-10-CM | POA: Insufficient documentation

## 2022-09-05 DIAGNOSIS — Z87891 Personal history of nicotine dependence: Secondary | ICD-10-CM | POA: Diagnosis not present

## 2022-09-05 DIAGNOSIS — R0602 Shortness of breath: Secondary | ICD-10-CM | POA: Diagnosis not present

## 2022-09-05 DIAGNOSIS — E785 Hyperlipidemia, unspecified: Secondary | ICD-10-CM | POA: Diagnosis not present

## 2022-09-05 NOTE — Progress Notes (Signed)
Radiation Oncology         [phone] ________________________________  Name: Brandon Phillips        MRN: [id number]  Date of Service: 09/05/2022 DOB: [birth year]  JS:EGBTDV, Caryl Asp, NP  [name], MD     REFERRING PHYSICIAN: [name], MD   DIAGNOSIS: The encounter diagnosis was Primary malignant neoplasm of left lower lobe of lung ([place]).   HISTORY OF PRESENT ILLNESS: Shell Yandow is a 57 y.o. male seen at the request of Dr. 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 Patient underwent a screening CT of chest on 07/25/22 due to his extensive smoking history. Screening CT on 07/25/22 revealed a left lower lobe mass measuring 3.5 cm as well as a right upper lobe pulmonary nodule measuring 0.79 cm. No thoracic adenopathy was visualized. PET scan on 08/14/22 showed a hypermetabolic area of activity in the left lower lobe, consistent with the previously visualized mass; posterior left hilar hypermetabolism, likely due to a small node; and nonspecific low-level activity in the previously visualized right upper lobe pulmonary nodule. MRI of the brain on 08/24/22 fortunately showed no evidence of metastatic disease. Accordingly, Dr. Verlee Monte performed bronchoscopy on 08/29/22. Pathology revealed adenocarcinoma present at the left lower lobe and atypical cells at the 11L lymph node. The right upper lung was negative for malignancy. He was discussed at the multidisciplinary lung tumor conference today. It was felt that he was a borderline surgical candidate, so he was scheduled for further evaluation with Dr. Roxan Hockey on 09/11/22.   Patient is here to discuss possible radiation therapy options.   He is here today with his supportive wife. Patient is no longer smoking. He endorses shortness of breath with exertion as well as a productive cough in the morning. He has had some voice hoarseness for the past year. He denies any chest pain, unintentional weight  loss, or fevers.   PREVIOUS RADIATION THERAPY: No   PAST MEDICAL HISTORY:  Past Medical History:  Diagnosis Date   Cancer ([place])    lung cancer   CAP (community acquired pneumonia)    COPD (chronic obstructive pulmonary disease) ([place])    Eczema    HLD (hyperlipidemia)    Tobacco use disorder 01/24/2017       PAST SURGICAL HISTORY: Past Surgical History:  Procedure Laterality Date   BRONCHIAL BIOPSY  08/29/2022   Procedure: BRONCHIAL BIOPSIES;  Surgeon: [name], MD;  Location: [place];  Service: Pulmonary;;   BRONCHIAL BRUSHINGS  08/29/2022   Procedure: BRONCHIAL BRUSHINGS;  Surgeon: [name], MD;  Location: [place];  Service: Pulmonary;;   BRONCHIAL NEEDLE ASPIRATION BIOPSY  08/29/2022   Procedure: BRONCHIAL NEEDLE ASPIRATION BIOPSIES;  Surgeon: [name], MD;  Location: [place];  Service: Pulmonary;;   BRONCHIAL WASHINGS  08/29/2022   Procedure: BRONCHIAL WASHINGS;  Surgeon: [name], MD;  Location: [place];  Service: Pulmonary;;   FIDUCIAL MARKER PLACEMENT  08/29/2022   Procedure: FIDUCIAL MARKER PLACEMENT;  Surgeon: [name], MD;  Location: [facility];  Service: Pulmonary;;   HERNIA REPAIR     as an infant   [place]  08/29/2022   Procedure: VIDEO BRONCHOSCOPY WITH ENDOBRONCHIAL ULTRASOUND;  Surgeon: [name], MD;  Location: [facility];  Service: Pulmonary;;     FAMILY HISTORY: History reviewed. No pertinent family history.   SOCIAL HISTORY:  reports that he quit smoking about 5 years  ago. His smoking use included cigarettes. He has a 52.50 pack-year smoking history. He has never used smokeless tobacco. He reports that he does not drink alcohol and does not use drugs.   ALLERGIES: Patient has no known allergies.   MEDICATIONS:  Current Outpatient Medications  Medication Sig Dispense Refill   albuterol (VENTOLIN HFA) 108 (90 Base) MCG/ACT inhaler Inhale 2  puffs into the lungs every 6 (six) hours as needed for wheezing. 2 each 11   aspirin EC 81 MG tablet Take 81 mg by mouth every evening. Swallow whole.     atorvastatin (LIPITOR) 20 MG tablet TAKE ONE TABLET BY MOUTH DAILY 90 tablet 3   budesonide-formoterol (SYMBICORT) 160-4.5 MCG/ACT inhaler Inhale 2 puffs into the lungs 2 (two) times daily. 10.2 g 6   COENZYME Q10 PO Take 100 mg by mouth in the morning.     GUAIFENESIN 1200 PO Take 1,200 mg by mouth in the morning and at bedtime.     Multiple Vitamin (MULTIVITAMIN WITH MINERALS) TABS tablet Take 1 tablet by mouth every evening.     No current facility-administered medications for this encounter.     REVIEW OF SYSTEMS: As per HPI     PHYSICAL EXAM:  Wt Readings from Last 3 Encounters:  09/05/22 168 lb (76.2 kg)  08/29/22 157 lb (71.2 kg)  08/20/22 162 lb (73.5 kg)   Temp Readings from Last 3 Encounters:  09/05/22 97.6 F (36.4 C)  08/29/22 98.3 F (36.8 C)  08/16/22 98.7 F (37.1 C) (Oral)   BP Readings from Last 3 Encounters:  09/05/22 (!) 144/97  08/29/22 104/76  08/20/22 136/80   Pulse Readings from Last 3 Encounters:  09/05/22 96  08/29/22 60  08/20/22 78   Pain Assessment Pain Score: 0-No pain/10  In general this is a well appearing male in no acute distress. He's alert and oriented x4 and appropriate throughout the examination. Cardiopulmonary assessment is negative for acute distress and he exhibits normal effort.     ECOG = 1  0 - Asymptomatic (Fully active, able to carry on all predisease activities without restriction)  1 - Symptomatic but completely ambulatory (Restricted in physically strenuous activity but ambulatory and able to carry out work of a light or sedentary nature. For example, light housework, office work)  2 - Symptomatic, <50% in bed during the day (Ambulatory and capable of all self care but unable to carry out any work activities. Up and about more than 50% of waking hours)  3 -  Symptomatic, >50% in bed, but not bedbound (Capable of only limited self-care, confined to bed or chair 50% or more of waking hours)  4 - Bedbound (Completely disabled. Cannot carry on any self-care. Totally confined to bed or chair)  5 - Death   Eustace Pen MM, Creech RH, Tormey DC, et al. [phone]). "Toxicity and response criteria of the [facility]". [place] Oncol. 5 (6): 649-55    LABORATORY DATA:  Lab Results  Component Value Date   WBC 10.8 (H) 08/16/2022   HGB 16.2 08/16/2022   HCT 48.8 08/16/2022   MCV 88.7 08/16/2022   PLT 278 08/16/2022   Lab Results  Component Value Date   NA 142 08/16/2022   K 4.4 08/16/2022   CL 98 08/16/2022   CO2 35 (H) 08/16/2022   Lab Results  Component Value Date   ALT 30 08/16/2022   AST 21 08/16/2022   ALKPHOS 60 08/16/2022   BILITOT 0.5 08/16/2022  RADIOGRAPHY: DG CHEST PORT 1 VIEW  Result Date: 08/29/2022 CLINICAL DATA:  Status post bronchoscopy EXAM: PORTABLE CHEST 1 VIEW COMPARISON:  02/23/2017 FINDINGS: Normal cardiac and mediastinal contours. No focal pulmonary opacity. No pleural effusion or pneumothorax. No acute osseous abnormality. Surgical clip overlies the right upper lobe. IMPRESSION: No pneumothorax status post bronchoscopy. Electronically Signed   By: Merilyn Baba M.D.   On: 08/29/2022 11:20   DG C-ARM BRONCHOSCOPY  Result Date: 08/29/2022 C-ARM BRONCHOSCOPY: Fluoroscopy was utilized by the requesting physician.  No radiographic interpretation.   MR Brain W Wo Contrast  Result Date: 08/26/2022 CLINICAL DATA:  Non-small cell lung cancer.  Staging. EXAM: MRI HEAD WITHOUT AND WITH CONTRAST TECHNIQUE: Multiplanar, multiecho pulse sequences of the brain and surrounding structures were obtained without and with intravenous contrast. CONTRAST:  37mL GADAVIST GADOBUTROL 1 MMOL/ML IV SOLN COMPARISON:  None Available. FINDINGS: Brain: The brain has a normal appearance without evidence of malformation, atrophy,  old or acute small or large vessel infarction, mass lesion, hemorrhage, hydrocephalus or extra-axial collection. Vascular: Major vessels at the base of the brain show flow. Venous sinuses appear patent. Skull and upper cervical spine: Normal. Sinuses/Orbits: Clear/normal. Other: None significant. IMPRESSION: Normal examination. No evidence of metastatic disease. Electronically Signed   By: Nelson Chimes M.D.   On: 08/26/2022 14:14   NM PET Image Initial (PI) Skull Base To Thigh (F-18 FDG)  Result Date: 08/14/2022 CLINICAL DATA:  Initial treatment strategy for lung cancer screening CT demonstrating left lower lobe lung mass and right upper lobe pulmonary nodule. EXAM: NUCLEAR MEDICINE PET SKULL BASE TO THIGH TECHNIQUE: 8.5 mCi F-18 FDG was injected intravenously. Full-ring PET imaging was performed from the skull base to thigh after the radiotracer. CT data was obtained and used for attenuation correction and anatomic localization. Fasting blood glucose: 113 mg/dl COMPARISON:  Chest CT of 07/25/2022 FINDINGS: Mediastinal blood pool activity: SUV max 2.4 Liver activity: SUV max NA NECK: No areas of abnormal hypermetabolism. Incidental CT findings: No cervical adenopathy. CHEST: Low-level activity corresponding to the right upper lobe pulmonary nodule which is at the low end of PET resolution. 8 mm and a S.U.V. max of 1.6 on 64/4. Posterior left hilar hypermetabolism is likely due to a diminutive node. Example at a S.U.V. max of 5.6 on 74/3. The central left lower lobe spiculated lung mass measures 3.3 x 2.7 cm and a S.U.V. max of 17.1 on 83/4. Incidental CT findings: Centrilobular emphysema. Aortic and coronary artery calcification. ABDOMEN/PELVIS: No abdominopelvic parenchymal or nodal hypermetabolism. Incidental CT findings: Abdominal aortic atherosclerosis. Scattered colonic diverticula. SKELETON: No abnormal marrow activity. Incidental CT findings: None. IMPRESSION: 1. Left lower lobe lung mass and ipsilateral  hilar nodal metastasis. T2aN1M0 or stage IIB 2. Right upper lobe pulmonary nodule of 8 mm is at the low end of PET resolution and demonstrates nonspecific low-level activity. This can be re-evaluated at follow-up. 3. Incidental findings, including: Aortic atherosclerosis (ICD10-I70.0), coronary artery atherosclerosis and emphysema (ICD10-J43.9). Electronically Signed   By: Abigail Miyamoto M.D.   On: 08/14/2022 11:59       IMPRESSION/PLAN: 1. Adenocarcinoma of the left lower lobe with a hypermetabolic lymph node on PET.  Today, we discussed the pathology results and the nature of adenocarcinoma of the lung with an atypical node. Dr. Lisbeth Renshaw recommends 6 weeks of concurrent chemoradiation therapy if patient is not a surgical candidate. He will meet with Dr. Roxan Hockey on 09/11/22 to see if surgery is an option.    We discussed the  risks, benefits, short, and long term effects of radiotherapy, as well as the curative intent, and the patient is interested in proceeding. Dr. Lisbeth Renshaw discussed the delivery and logistics of radiotherapy and anticipates a course of 6 weeks of concurrent chemoradiation to the biopsy confirmed LLL lung mass as well as the PET positive lymph node. Patient has a good understanding of the treatment plan and is enthusiastic about beginning treatment. All questions were answered. We will schedule CT simulation to plan radiation treatment for next week. We will await Dr. Leonarda Salon decision on surgery and can cancel this appointment if he does decide to proceed with surgery.   In a visit lasting 60 minutes, greater than 50% of the time was spent face to face discussing the patient's condition, in preparation for the discussion, and coordinating the patient's care.   The above documentation reflects my direct findings during this shared patient visit. Please see the separate note by Dr. Lisbeth Renshaw on this date for the remainder of the patient's plan of care.    Leona Singleton,  [place]   **Disclaimer: This note was dictated with voice recognition software. Similar sounding words can inadvertently be transcribed and this note may contain transcription errors which may not have been corrected upon publication of note.**

## 2022-09-05 NOTE — Progress Notes (Signed)
Patient was discussed at Thoracic Tumor Board this morning for discussion about whether or not he would be a surgical candidate. It was felt that he was borderline, but that a referral should be made for further assessment.  He is already scheduled with Dr Roxan Hockey on 09/11/2022. Patient is also scheduled with RadOnc to discuss possible radiation.   Discussion reviewed with Dr Marin Olp.   Oncology Nurse Navigator Documentation     09/05/2022    7:00 AM  Oncology Nurse Navigator Flowsheets  Navigator Follow Up Date: 09/11/2022  Navigator Follow Up Reason: Review Note  Navigator Location [facility]  Navigator Encounter Type Clinic/MDC  Multidisiplinary Clinic Date 09/05/2022  Multidisiplinary Clinic Type Thoracic  Patient Visit Type MedOnc  Treatment Phase Pre-Tx/Tx Discussion  Barriers/Navigation Needs Coordination of Care;Education  Interventions Coordination of Care  Acuity Level 2-Minimal Needs (1-2 Barriers Identified)  Support Groups/Services Friends and Family  Time Spent with Patient 42

## 2022-09-06 DIAGNOSIS — Z87891 Personal history of nicotine dependence: Secondary | ICD-10-CM | POA: Diagnosis not present

## 2022-09-06 DIAGNOSIS — C3432 Malignant neoplasm of lower lobe, left bronchus or lung: Secondary | ICD-10-CM | POA: Diagnosis not present

## 2022-09-10 ENCOUNTER — Ambulatory Visit (HOSPITAL_COMMUNITY): Payer: BC Managed Care – PPO | Attending: Student

## 2022-09-10 DIAGNOSIS — R0600 Dyspnea, unspecified: Secondary | ICD-10-CM

## 2022-09-10 LAB — ECHOCARDIOGRAM COMPLETE
Area-P 1/2: 5.02 cm2
S' Lateral: 2.4 cm

## 2022-09-11 ENCOUNTER — Institutional Professional Consult (permissible substitution) (INDEPENDENT_AMBULATORY_CARE_PROVIDER_SITE_OTHER): Payer: BC Managed Care – PPO | Admitting: Thoracic Surgery (Cardiothoracic Vascular Surgery)

## 2022-09-11 ENCOUNTER — Encounter: Payer: Self-pay | Admitting: Thoracic Surgery (Cardiothoracic Vascular Surgery)

## 2022-09-11 VITALS — BP 144/90 | HR 88 | Resp 20 | Ht 65.0 in | Wt 165.0 lb

## 2022-09-11 DIAGNOSIS — R918 Other nonspecific abnormal finding of lung field: Secondary | ICD-10-CM | POA: Diagnosis not present

## 2022-09-11 NOTE — Progress Notes (Signed)
PCP is Samuel Bouche, NP Referring Provider is Volanda Napoleon, MD  Chief Complaint  Patient presents with   Lung Lesion    Surgical consult, Chest CT 07/25/22/ PET Scan 08/14/22/ MR Brain 08/21/22/ PFT's 08/26/22/ Bronch 08/29/22    HPI: [name] is sent for consultation regarding a newly diagnosed adenocarcinoma of the lung.  Brandon Phillips is a 57 year old gentleman with a history of tobacco use, COPD, hyperlipidemia, hypertension, coronary calcification, and community-acquired pneumonia.  He has about a 50-pack-year history of smoking prior to quitting in 2018.  He then used electronic cigarettes until quitting those recently.  He had a low-dose CT for lung cancer screening in late December.  It showed a 3.5 cm central left lower lobe lung mass.  There also was a peripheral right upper lobe nodule that was 8 mm in diameter.  A PET/CT showed the lower lobe mass was hypermetabolic with an SUV of 09.7, and there was also hypermetabolic focus in the left hilum.  There was minimal uptake associated with the right upper lobe nodule.  He underwent navigational bronchoscopy and endobronchial ultrasound.  Biopsies of the lower lobe nodule showed adenocarcinoma.  Aspirations from the 11 L node showed atypical cells.  Samples from the right upper lobe showed inflammation but no malignancy.  He works in Theatre manager.  He can walk up 2 flights of stairs without stopping.  He will get short of breath if he is carrying something for relatively long distance.  No chest pain, pressure, or tightness with exertion.  No change in appetite or weight loss.  No unusual headaches or visual changes.  He did have an MRI that showed no evidence of metastatic disease.  Zubrod Score: At the time of surgery this patient's most appropriate activity status/level should be described as: [x]     0    Normal activity, no symptoms []     1    Restricted in physical strenuous activity but ambulatory, able to do out light work []     2     Ambulatory and capable of self care, unable to do work activities, up and about >50 % of waking hours                              []     3    Only limited self care, in bed greater than 50% of waking hours []     4    Completely disabled, no self care, confined to bed or chair []     5    Moribund (Really about 0.5)  Past Medical History:  Diagnosis Date   Cancer ([place])    lung cancer   CAP (community acquired pneumonia)    COPD (chronic obstructive pulmonary disease) ([place])    Eczema    HLD (hyperlipidemia)    Tobacco use disorder 01/24/2017    Past Surgical History:  Procedure Laterality Date   BRONCHIAL BIOPSY  08/29/2022   Procedure: BRONCHIAL BIOPSIES;  Surgeon: [name], MD;  Location: [facility] ENDOSCOPY;  Service: Pulmonary;;   BRONCHIAL BRUSHINGS  08/29/2022   Procedure: BRONCHIAL BRUSHINGS;  Surgeon: [name], MD;  Location: [place];  Service: Pulmonary;;   BRONCHIAL NEEDLE ASPIRATION BIOPSY  08/29/2022   Procedure: BRONCHIAL NEEDLE ASPIRATION BIOPSIES;  Surgeon: [name], MD;  Location: [place];  Service: Pulmonary;;   BRONCHIAL WASHINGS  08/29/2022   Procedure: BRONCHIAL WASHINGS;  Surgeon: [name], MD;  Location:  [facility] ENDOSCOPY;  Service: Pulmonary;;   FIDUCIAL MARKER PLACEMENT  08/29/2022   Procedure: FIDUCIAL MARKER PLACEMENT;  Surgeon: [name], MD;  Location: [facility] ENDOSCOPY;  Service: Pulmonary;;   HERNIA REPAIR     as an infant   [place]  08/29/2022   Procedure: VIDEO BRONCHOSCOPY WITH ENDOBRONCHIAL ULTRASOUND;  Surgeon: [name], MD;  Location: [facility] ENDOSCOPY;  Service: Pulmonary;;    History reviewed. No pertinent family history.  Social History Social History   Tobacco Use   Smoking status: Former    Packs/day: 1.50    Years: 35.00    Total pack years: 52.50    Types: Cigarettes    Quit date: 03/30/2017    Years since quitting: 5.4   Smokeless tobacco: Never  Vaping  Use   Vaping Use: Every day   Substances: Nicotine  Substance Use Topics   Alcohol use: No   Drug use: No    Current Outpatient Medications  Medication Sig Dispense Refill   albuterol (VENTOLIN HFA) 108 (90 Base) MCG/ACT inhaler Inhale 2 puffs into the lungs every 6 (six) hours as needed for wheezing. 2 each 11   aspirin EC 81 MG tablet Take 81 mg by mouth every evening. Swallow whole.     atorvastatin (LIPITOR) 20 MG tablet TAKE ONE TABLET BY MOUTH DAILY 90 tablet 3   budesonide-formoterol (SYMBICORT) 160-4.5 MCG/ACT inhaler Inhale 2 puffs into the lungs 2 (two) times daily. 10.2 g 6   COENZYME Q10 PO Take 100 mg by mouth in the morning.     GUAIFENESIN 1200 PO Take 1,200 mg by mouth in the morning and at bedtime.     Multiple Vitamin (MULTIVITAMIN WITH MINERALS) TABS tablet Take 1 tablet by mouth every evening.     No current facility-administered medications for this visit.    No Known Allergies  Review of Systems  Constitutional:  Negative for activity change and unexpected weight change.  HENT:  Negative for trouble swallowing and voice change.   Respiratory:  Positive for cough and shortness of breath (With heavy exertion).   Cardiovascular:  Negative for chest pain and leg swelling.  Genitourinary:  Negative for difficulty urinating and dysuria.  Musculoskeletal:  Negative for arthralgias and gait problem.  Neurological:  Negative for seizures, syncope and weakness.  Hematological:  Negative for adenopathy. Does not bruise/bleed easily.  All other systems reviewed and are negative.   BP (!) 144/90   Pulse 88   Resp 20   Ht 5\' 5"  (1.651 m)   Wt 165 lb (74.8 kg)   SpO2 92% Comment: RA  BMI 27.46 kg/m  Physical Exam Vitals reviewed.  Constitutional:      General: He is not in acute distress.    Appearance: Normal appearance.  HENT:     Head: Normocephalic and atraumatic.  Eyes:     General: No scleral icterus.    Extraocular Movements: Extraocular movements  intact.  Cardiovascular:     Rate and Rhythm: Normal rate and regular rhythm.     Heart sounds: Normal heart sounds. No murmur heard.    No friction rub. No gallop.  Pulmonary:     Effort: Pulmonary effort is normal. No respiratory distress.     Breath sounds: Normal breath sounds. No wheezing or rales.  Abdominal:     General: There is no distension.     Palpations: Abdomen is soft.  Musculoskeletal:     Cervical back: Neck supple.  Lymphadenopathy:  Cervical: No cervical adenopathy.  Skin:    General: Skin is warm and dry.  Neurological:     General: No focal deficit present.     Mental Status: He is alert and oriented to person, place, and time.     Cranial Nerves: No cranial nerve deficit.     Motor: No weakness.    Diagnostic Tests: CT CHEST WITHOUT CONTRAST LOW-DOSE FOR LUNG CANCER SCREENING   TECHNIQUE: Multidetector CT imaging of the chest was performed following the standard protocol without IV contrast.   RADIATION DOSE REDUCTION: This exam was performed according to the departmental dose-optimization program which includes automated exposure control, adjustment of the mA and/or kV according to patient size and/or use of iterative reconstruction technique.   COMPARISON:  03/02/2020 chest CT.   FINDINGS: Cardiovascular: Normal heart size. No significant pericardial effusion/thickening. Three-vessel coronary atherosclerosis. Atherosclerotic nonaneurysmal thoracic aorta. Normal caliber pulmonary arteries.   Mediastinum/Nodes: No significant thyroid nodules. Unremarkable esophagus. No pathologically enlarged axillary, mediastinal or hilar lymph nodes, noting limited sensitivity for the detection of hilar adenopathy on this noncontrast study.   Lungs/Pleura: No pneumothorax. No pleural effusion. Moderate centrilobular emphysema with diffuse bronchial wall thickening. Spiculated solid central left lower lobe lung mass measuring 35.0 mm in volume derived mean  diameter (series 3/image 183), new. Solid peripheral right upper lobe pulmonary nodule measuring 7.9 mm in volume derived mean diameter (series 3/image 97), new.   Upper abdomen: No acute abnormality.   Musculoskeletal: No aggressive appearing focal osseous lesions. Mild thoracic spondylosis.   IMPRESSION: 1. Lung-RADS 4B, suspicious. Additional imaging evaluation or consultation with Pulmonology or Thoracic Surgery recommended. Spiculated solid central left lower lobe lung mass measuring 35.0 mm in volume derived mean diameter, new, highly suspicious for primary bronchogenic carcinoma. Solid peripheral right upper lobe pulmonary nodule measuring 7.9 mm in volume derived mean diameter, new, suspicious for contralateral pulmonary metastasis versus synchronous primary malignancy. PET-CT recommended for further staging evaluation. 2. No thoracic adenopathy. 3. Three-vessel coronary atherosclerosis. 4. Aortic Atherosclerosis (ICD10-I70.0) and Emphysema (ICD10-J43.9).   These results will be called to the ordering clinician or representative by the Radiologist Assistant, and communication documented in the PACS or [employer].     Electronically Signed   By: Ilona Sorrel M.D.   On: 07/30/2022 13:53 NUCLEAR MEDICINE PET SKULL BASE TO THIGH   TECHNIQUE: 8.5 mCi F-18 FDG was injected intravenously. Full-ring PET imaging was performed from the skull base to thigh after the radiotracer. CT data was obtained and used for attenuation correction and anatomic localization.   Fasting blood glucose: 113 mg/dl   COMPARISON:  Chest CT of 07/25/2022   FINDINGS: Mediastinal blood pool activity: SUV max 2.4   Liver activity: SUV max NA   NECK: No areas of abnormal hypermetabolism.   Incidental CT findings: No cervical adenopathy.   CHEST: Low-level activity corresponding to the right upper lobe pulmonary nodule which is at the low end of PET resolution. 8 mm and a S.U.V. max of  1.6 on 64/4.   Posterior left hilar hypermetabolism is likely due to a diminutive node. Example at a S.U.V. max of 5.6 on 74/3.   The central left lower lobe spiculated lung mass measures 3.3 x 2.7 cm and a S.U.V. max of 17.1 on 83/4.   Incidental CT findings: Centrilobular emphysema. Aortic and coronary artery calcification.   ABDOMEN/PELVIS: No abdominopelvic parenchymal or nodal hypermetabolism.   Incidental CT findings: Abdominal aortic atherosclerosis. Scattered colonic diverticula.   SKELETON: No abnormal marrow  activity.   Incidental CT findings: None.   IMPRESSION: 1. Left lower lobe lung mass and ipsilateral hilar nodal metastasis. T2aN1M0 or stage IIB 2. Right upper lobe pulmonary nodule of 8 mm is at the low end of PET resolution and demonstrates nonspecific low-level activity. This can be re-evaluated at follow-up. 3. Incidental findings, including: Aortic atherosclerosis (ICD10-I70.0), coronary artery atherosclerosis and emphysema (ICD10-J43.9).     Electronically Signed   By: Abigail Miyamoto M.D.   On: 08/14/2022 11:59 I personally reviewed the CT and PET/CT images.  There is a 3.5 cm spiculated mass centrally in the left lower lobe that is markedly hypermetabolic on PET.  Also significant metabolic activity in the left hilum.  No evidence of distant metastases.  Emphysema.  Aortic and coronary atherosclerosis.  Pulmonary function testing 08/26/2022 FVC 2.75 (66%) FEV1 1.33 (42%) No change with bronchodilators DLCO 14.04 (57%)  Impression: Brandon Phillips is a 57 year old gentleman with a history of tobacco use, COPD, hyperlipidemia, hypertension, coronary calcification, and community-acquired pneumonia.  He has about a 50-pack-year history of smoking prior to quitting in 2018.  He then used electronic cigarettes until quitting those recently.  A low-dose CT in December showed a new left lower lobe mass and a small right upper lobe nodule.  On PET/CT there was  activity in the mass and a hilar node.  Clinical stage was T2a, N1, stage IIb.  Bronchoscopy confirmed the diagnosis of adenocarcinoma.  Aspirations from level 11 node were not definitive but did show atypical cells and combined with the findings on PET/CT, I think we have to consider that positive.  Samples from the right upper lobe nodule showed inflammation.  There is no malignancy.  Does not completely rule it out given the small size of the nodule.  That will need to be followed but for now I think we given the benefit of the doubt regarding that nodule.  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  He has good exercise tolerance.  His pulmonary function is more than adequate to tolerate a lobectomy but would not tolerate a pneumonectomy.  We discussed potential treatment options including surgery or radiation along with chemotherapy.  We discussed the relative advantages and disadvantages of each.  My recommendation would be to treat with neoadjuvant chemoimmunotherapy followed by restaging and then plan for surgical resection.  I think they gives him the best chance of a cure, although it is by no means a guarantee.  He and his wife had a lot of questions about the different treatment options.  They are very concerned about his functional status after lobectomy.  He certainly will lose function along with the procedure.  And overall his pulmonary function will probably decrease by about 20% (from 50% to 40% of predicted).  He should still be able to be active but will tire more easily than he does now.  Should not need oxygen.  Undetermined if he will be able to return to work afterwards, but if he can somewhat limit his very strenuous activities he should be able to do the majority of his duties.  They need some time to think over his options.  Will discuss with colleagues at our multidisciplinary thoracic oncology.  Plan: He will follow-up with Dr. Marin Olp If he decides to proceed with  radiation he is scheduled for simulation on Friday. If he wishes to pursue surgery we will need neoadjuvant chemoimmunotherapy and restaging with CT and I will see him back after that.  I spent over an hour and review of records, images, and in consultation with Mr. Nuon today. Melrose Nakayama, MD [facility] [phone]

## 2022-09-12 ENCOUNTER — Encounter: Payer: Self-pay | Admitting: *Deleted

## 2022-09-12 NOTE — Progress Notes (Signed)
Patient was discussed at Thoracic Conference again this morning. Dr Roxan Hockey didn't feel like the patient was a surgical candidate at this time. Neo-Adjuvant Chemo RT was recommended. Patient gets simulated tomorrow, 09/13/2022.  Spoke with Dr Marin Olp. He would like to see patient on 09/17/2022. Spoke to patient's wife, Olivia Mackie and she is aware of appointment including date, time and location.   Oncology Nurse Navigator Documentation     09/12/2022    1:00 PM  Oncology Nurse Navigator Flowsheets  Navigator Follow Up Date: 09/17/2022  Navigator Follow Up Reason: Follow-up Appointment  Navigator Location [facility]  Navigator Encounter Type Telephone;Appt/Treatment Plan Review;Clinic/MDC  Telephone Outgoing Call  [facility] Date 09/12/2022  Multidisiplinary Clinic Type Thoracic  Patient Visit Type MedOnc  Treatment Phase Pre-Tx/Tx Discussion  Barriers/Navigation Needs Coordination of Care;Education  Interventions Coordination of Care;Education;Psycho-Social Support  Acuity Level 2-Minimal Needs (1-2 Barriers Identified)  Coordination of Care Appts;Other  Education Method Verbal  Support Groups/Services Friends and Family  Time Spent with Patient 76

## 2022-09-13 ENCOUNTER — Ambulatory Visit
Admission: RE | Admit: 2022-09-13 | Discharge: 2022-09-13 | Disposition: A | Discharge status: Home / Self Care | Payer: BC Managed Care – PPO | Source: Ambulatory Visit | Attending: Radiation Oncology | Admitting: Radiation Oncology

## 2022-09-13 ENCOUNTER — Other Ambulatory Visit: Payer: Self-pay

## 2022-09-13 ENCOUNTER — Ambulatory Visit: Payer: BC Managed Care – PPO | Admitting: Radiation Oncology

## 2022-09-13 ENCOUNTER — Other Ambulatory Visit: Payer: BC Managed Care – PPO

## 2022-09-13 DIAGNOSIS — Z5111 Encounter for antineoplastic chemotherapy: Secondary | ICD-10-CM | POA: Insufficient documentation

## 2022-09-13 DIAGNOSIS — Z510 Encounter for antineoplastic radiation therapy: Secondary | ICD-10-CM | POA: Insufficient documentation

## 2022-09-13 DIAGNOSIS — Z87891 Personal history of nicotine dependence: Secondary | ICD-10-CM | POA: Insufficient documentation

## 2022-09-13 DIAGNOSIS — C3432 Malignant neoplasm of lower lobe, left bronchus or lung: Secondary | ICD-10-CM | POA: Insufficient documentation

## 2022-09-13 DIAGNOSIS — C3490 Malignant neoplasm of unspecified part of unspecified bronchus or lung: Secondary | ICD-10-CM | POA: Diagnosis not present

## 2022-09-13 NOTE — Progress Notes (Signed)
The proposed treatment discussed in conference is for discussion purpose only and is not a binding recommendation.  The patients have not been physically examined, or presented with their treatment options.  Therefore, final treatment plans cannot be decided.  

## 2022-09-16 ENCOUNTER — Other Ambulatory Visit: Payer: BC Managed Care – PPO

## 2022-09-16 ENCOUNTER — Other Ambulatory Visit: Payer: Self-pay

## 2022-09-16 ENCOUNTER — Ambulatory Visit: Payer: BC Managed Care – PPO | Admitting: Hematology & Oncology

## 2022-09-16 ENCOUNTER — Encounter (HOSPITAL_COMMUNITY): Payer: Self-pay | Admitting: Hematology & Oncology

## 2022-09-16 DIAGNOSIS — J984 Other disorders of lung: Secondary | ICD-10-CM

## 2022-09-17 ENCOUNTER — Inpatient Hospital Stay (HOSPITAL_BASED_OUTPATIENT_CLINIC_OR_DEPARTMENT_OTHER): Payer: BC Managed Care – PPO | Admitting: Hematology & Oncology

## 2022-09-17 ENCOUNTER — Encounter: Payer: Self-pay | Admitting: Hematology & Oncology

## 2022-09-17 ENCOUNTER — Other Ambulatory Visit: Payer: Self-pay

## 2022-09-17 ENCOUNTER — Encounter: Payer: Self-pay | Admitting: *Deleted

## 2022-09-17 ENCOUNTER — Other Ambulatory Visit: Payer: Self-pay | Admitting: *Deleted

## 2022-09-17 ENCOUNTER — Inpatient Hospital Stay: Payer: BC Managed Care – PPO | Attending: Hematology & Oncology

## 2022-09-17 VITALS — BP 140/94 | HR 69 | Temp 98.0°F | Resp 16 | Ht 65.0 in | Wt 173.0 lb

## 2022-09-17 DIAGNOSIS — J984 Other disorders of lung: Secondary | ICD-10-CM

## 2022-09-17 DIAGNOSIS — C3432 Malignant neoplasm of lower lobe, left bronchus or lung: Secondary | ICD-10-CM

## 2022-09-17 DIAGNOSIS — Z87891 Personal history of nicotine dependence: Secondary | ICD-10-CM | POA: Diagnosis not present

## 2022-09-17 DIAGNOSIS — Z5111 Encounter for antineoplastic chemotherapy: Secondary | ICD-10-CM | POA: Diagnosis not present

## 2022-09-17 DIAGNOSIS — Z510 Encounter for antineoplastic radiation therapy: Secondary | ICD-10-CM | POA: Diagnosis not present

## 2022-09-17 HISTORY — DX: Malignant neoplasm of lower lobe, left bronchus or lung: C34.32

## 2022-09-17 LAB — CBC WITH DIFFERENTIAL (CANCER CENTER ONLY)
Abs Immature Granulocytes: 0.03 10*3/uL (ref 0.00–0.07)
Basophils Absolute: 0.1 10*3/uL (ref 0.0–0.1)
Basophils Relative: 1 %
Eosinophils Absolute: 0.2 10*3/uL (ref 0.0–0.5)
Eosinophils Relative: 2 %
HCT: 47.8 % (ref 39.0–52.0)
Hemoglobin: 16 g/dL (ref 13.0–17.0)
Immature Granulocytes: 0 %
Lymphocytes Relative: 21 %
Lymphs Abs: 2 10*3/uL (ref 0.7–4.0)
MCH: 29.4 pg (ref 26.0–34.0)
MCHC: 33.5 g/dL (ref 30.0–36.0)
MCV: 87.9 fL (ref 80.0–100.0)
Monocytes Absolute: 0.7 10*3/uL (ref 0.1–1.0)
Monocytes Relative: 8 %
Neutro Abs: 6.6 10*3/uL (ref 1.7–7.7)
Neutrophils Relative %: 68 %
Platelet Count: 277 10*3/uL (ref 150–400)
RBC: 5.44 MIL/uL (ref 4.22–5.81)
RDW: 13.4 % (ref 11.5–15.5)
WBC Count: 9.6 10*3/uL (ref 4.0–10.5)
nRBC: 0 % (ref 0.0–0.2)

## 2022-09-17 LAB — CMP (CANCER CENTER ONLY)
ALT: 41 U/L (ref 0–44)
AST: 26 U/L (ref 15–41)
Albumin: 4.1 g/dL (ref 3.5–5.0)
Alkaline Phosphatase: 53 U/L (ref 38–126)
Anion gap: 10 (ref 5–15)
BUN: 12 mg/dL (ref 6–20)
CO2: 30 mmol/L (ref 22–32)
Calcium: 9.4 mg/dL (ref 8.9–10.3)
Chloride: 100 mmol/L (ref 98–111)
Creatinine: 1.07 mg/dL (ref 0.61–1.24)
GFR, Estimated: >60 mL/min (ref >60)
Glucose, Bld: 104 mg/dL — ABNORMAL HIGH (ref 70–99)
Potassium: 4.9 mmol/L (ref 3.5–5.1)
Sodium: 140 mmol/L (ref 135–145)
Total Bilirubin: 0.7 mg/dL (ref 0.3–1.2)
Total Protein: 7.8 g/dL (ref 6.5–8.1)

## 2022-09-17 LAB — LACTATE DEHYDROGENASE: LDH: 141 U/L (ref 98–192)

## 2022-09-17 MED ORDER — PROCHLORPERAZINE MALEATE 10 MG PO TABS: 10.0000 mg | ORAL_TABLET | Freq: Four times a day (QID) | ORAL | 1 refills | Status: DC | PRN

## 2022-09-17 MED ORDER — FOLIC ACID 1 MG PO TABS: 1.0000 mg | ORAL_TABLET | Freq: Every day | ORAL | 3 refills | Status: DC

## 2022-09-17 MED ORDER — DEXAMETHASONE 4 MG PO TABS: ORAL_TABLET | ORAL | 1 refills | Status: DC

## 2022-09-17 MED ORDER — ONDANSETRON HCL 8 MG PO TABS: 8.0000 mg | ORAL_TABLET | Freq: Three times a day (TID) | ORAL | 1 refills | Status: DC | PRN

## 2022-09-17 NOTE — Progress Notes (Signed)
Hematology and Oncology Follow Up Visit  Brandon Phillips [id number] 01/02/1966 57 y.o. 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  Brandon Phillips had the bronchoscopy.  Brandon Phillips had biopsies done.  Brandon Phillips had atypical cells that were noted.  Is felt that this was likely adenocarcinoma.  Unfortunately, there was not enough material for molecular studies.  Brandon Phillips was presented at conference.  It was felt that Brandon Phillips might benefit from neoadjuvant chemoimmunotherapy followed by surgery.  However, Brandon Phillips has elected not to have surgery.  We we will now treat him with combined chemotherapy and radiation therapy.  Brandon Phillips is still working.  Brandon Phillips looks quite good.  His weight is holding steady.  Brandon Phillips has had no problems with nausea or vomiting.  Brandon Phillips has had no issues with cough.  Brandon Phillips has had no hemoptysis.  There has been no problems with pain.  Brandon Phillips has had no change in bowel or bladder habits.  Brandon Phillips has had no leg swelling.  Overall, I would have said that his performance status is probably ECOG 1.  Medications:  Current Outpatient Medications:    albuterol (VENTOLIN HFA) 108 (90 Base) MCG/ACT inhaler, Inhale 2 puffs into the lungs every 6 (six) hours as needed for wheezing., Disp: 2 each, Rfl: 11   aspirin EC 81 MG tablet, Take 81 mg by mouth every evening. Swallow whole., Disp: , Rfl:    atorvastatin (LIPITOR) 20 MG tablet, TAKE ONE TABLET BY MOUTH DAILY, Disp: 90 tablet, Rfl: 3   budesonide-formoterol (SYMBICORT) 160-4.5 MCG/ACT inhaler, Inhale 2 puffs into the lungs 2 (two) times daily., Disp: 10.2 g, Rfl: 6   COENZYME Q10 PO, Take 100 mg by mouth in the morning., Disp: , Rfl:    GUAIFENESIN 1200 PO, Take 1,200 mg by mouth in the morning and at bedtime., Disp: , Rfl:    Multiple Vitamin (MULTIVITAMIN WITH MINERALS) TABS  tablet, Take 1 tablet by mouth every evening., Disp: , Rfl:   Allergies: No Known Allergies  Past Medical History, Surgical history, Social history, and Family History were reviewed and updated.  Review of Systems: Review of Systems  Constitutional: Negative.   HENT:  Negative.    Eyes: Negative.   Respiratory:  Positive for cough.   Cardiovascular: Negative.   Gastrointestinal: Negative.   Endocrine: Negative.   Genitourinary: Negative.    Musculoskeletal: Negative.   Skin: Negative.   Neurological: Negative.   Hematological: Negative.   Psychiatric/Behavioral: Negative.      Physical Exam:  height is 5\' 5"  (1.651 m) and weight is 173 lb (78.5 kg). His oral temperature is 98 F (36.7 C). His blood pressure is 140/94 (abnormal) and his pulse is 69. His respiration is 16 and oxygen saturation is 96%.   Wt Readings from Last 3 Encounters:  09/17/22 173 lb (78.5 kg)  09/11/22 165 lb (74.8 kg)  09/05/22 168 lb (76.2 kg)    Physical Exam Vitals reviewed.  HENT:     Head: Normocephalic and atraumatic.  Eyes:     Pupils: Pupils are equal, round, and reactive to light.  Cardiovascular:     Rate and Rhythm: Normal rate and regular rhythm.     Heart sounds: Normal heart sounds.  Pulmonary:     Effort: Pulmonary effort is normal.  Breath sounds: Normal breath sounds.  Abdominal:     General: Bowel sounds are normal.     Palpations: Abdomen is soft.  Musculoskeletal:        General: No tenderness or deformity. Normal range of motion.     Cervical back: Normal range of motion.  Lymphadenopathy:     Cervical: No cervical adenopathy.  Skin:    General: Skin is warm and dry.     Findings: No erythema or rash.  Neurological:     Mental Status: Brandon Phillips is alert and oriented to person, place, and time.  Psychiatric:        Behavior: Behavior normal.        Thought Content: Thought content normal.        Judgment: Judgment normal.     Lab Results  Component Value Date    WBC 9.6 09/17/2022   HGB 16.0 09/17/2022   HCT 47.8 09/17/2022   MCV 87.9 09/17/2022   PLT 277 09/17/2022     Chemistry      Component Value Date/Time   NA 140 09/17/2022 0945   K 4.9 09/17/2022 0945   CL 100 09/17/2022 0945   CO2 30 09/17/2022 0945   BUN 12 09/17/2022 0945   CREATININE 1.07 09/17/2022 0945   CREATININE 1.05 01/11/2022 0000      Component Value Date/Time   CALCIUM 9.4 09/17/2022 0945   ALKPHOS 53 09/17/2022 0945   AST 26 09/17/2022 0945   ALT 41 09/17/2022 0945   BILITOT 0.7 09/17/2022 0945      Impression and Plan: Brandon Phillips is a very nice 57 year old white male.  Brandon Phillips has a 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  Again, Brandon Phillips does not wish to have surgery.  Brandon Phillips has seen Dr. Roxan Hockey of thoracic surgery.  We have subsequently utilized concurrent chemotherapy and radiation therapy.  I would like to use carboplatinum/Alimta.  I think this would be a reasonable approach for chemotherapy.  It sounds like radiation therapy will be 6 weeks.  Brandon Phillips will need to have a Port-A-Cath.  Brandon Phillips has decent IV access but I still think that a Port-A-Cath would be quite helpful when Brandon Phillips starts to have issues with radiation and esophagitis.  I figure that we probably will use 3 cycles of chemotherapy with the radiation therapy.  I gave him some information about the chemotherapy.  I went over some of the side effects.  Brandon Phillips does understand this.  Hopefully, we can get treatment started on 09/23/2022.  I would like to see him back when Brandon Phillips starts his second cycle of treatment.     Volanda Napoleon, MD 2/20/20241:49 PM

## 2022-09-17 NOTE — Progress Notes (Signed)
START OFF PATHWAY REGIMEN - Non-Small Cell Lung   [id number]:Carboplatin AUC=5 + Pemetrexed 500 mg/m2 q21 Days:   A cycle is every 21 days:     Pemetrexed      Carboplatin   **Always confirm dose/schedule in your pharmacy ordering system**  Patient Characteristics: Preoperative or Nonsurgical Candidate (Clinical Staging), Stage II, Nonsurgical Candidate Therapeutic Status: Preoperative or Nonsurgical Candidate (Clinical Staging) AJCC T Category: cT2b AJCC N Category: cN1 AJCC M Category: cM0 AJCC 8 Stage Grouping: IIB Intent of Therapy: Curative Intent, Discussed with Patient

## 2022-09-17 NOTE — Progress Notes (Signed)
Patient will need to start concurrent chemo/radiation. Plan to start 09/23/2022. Fist port appointment isn't until early March, so we will give his first cycle peripherally. Chemo education can be provided the same day as his start. Referral to nutrition and social work placed per protocol.   Foundation One was unable to be completed due to insufficient tissue. Will draw and send [facility] Liquid Biopsy when he is here on Monday.  Called and spoke to patient about plan for next week.   Oncology Nurse Navigator Documentation     09/17/2022    1:45 PM  Oncology Nurse Navigator Flowsheets  Planned Course of Treatment Chemo/Radiation Concurrent  Phase of Treatment Chemo/Radiation Concurrent  Navigator Follow Up Date: 09/23/2022  Navigator Follow Up Reason: Chemotherapy  Navigator Location [facility]  Navigator Encounter Type Appt/Treatment Plan Review;Telephone  Telephone Appt Confirmation/Clarification;Education;Outgoing Call  Patient Visit Type MedOnc  Treatment Phase Pre-Tx/Tx Discussion  Barriers/Navigation Needs Coordination of Care;Education  Education Other  Interventions Coordination of Care;Education;Referrals  Acuity Level 2-Minimal Needs (1-2 Barriers Identified)  Referrals Nutrition/dietician;Social Work  English as a second language teacher of Care Radiology  Education Method Verbal  Support Groups/Services Friends and Family  Time Spent with Patient 30  Genetic Counseling Type None

## 2022-09-18 ENCOUNTER — Inpatient Hospital Stay: Payer: BC Managed Care – PPO | Admitting: Licensed Clinical Social Worker

## 2022-09-18 ENCOUNTER — Encounter: Payer: Self-pay | Admitting: Hematology & Oncology

## 2022-09-18 ENCOUNTER — Other Ambulatory Visit: Payer: Self-pay

## 2022-09-18 NOTE — Progress Notes (Signed)
Pharmacist Chemotherapy Monitoring - Initial Assessment    Anticipated start date: 09/23/22   The following has been reviewed per standard work regarding the patient's treatment regimen: The patient's diagnosis, treatment plan and drug doses, and organ/hematologic function Lab orders and baseline tests specific to treatment regimen  The treatment plan start date, drug sequencing, and pre-medications Prior authorization status  Patient's documented medication list, including drug-drug interaction screen and prescriptions for anti-emetics and supportive care specific to the treatment regimen The drug concentrations, fluid compatibility, administration routes, and timing of the medications to be used The patient's access for treatment and lifetime cumulative dose history, if applicable  The patient's medication allergies and previous infusion related reactions, if applicable   Changes made to treatment plan:  N/A  Follow up needed:  Give B12 prior to or day of   Rozalyn Osland, Jacqlyn Larsen, [facility], 09/18/2022  1:26 PM

## 2022-09-18 NOTE — Progress Notes (Signed)
[place] Work  Clinical Social Work was referred by Art therapist for assessment of psychosocial needs.  Clinical Social Worker contacted caregiver by phone  to offer support and assess for needs.    CSW to meet with patient and his wife, Linus Orn, prior to his radiation appointment on Monday, 2/26, at 3:30 in the radiation waiting room.  Linus Orn had questions about his medications and CSW referred to Dr. Antonieta Pert nurse.     Margaree Mackintosh, LCSW  Clinical Social Worker [facility]

## 2022-09-19 NOTE — Progress Notes (Signed)
FMLA papers faxed to [place] with confirmation received.    Fax no. [phone]

## 2022-09-20 DIAGNOSIS — Z510 Encounter for antineoplastic radiation therapy: Secondary | ICD-10-CM | POA: Diagnosis not present

## 2022-09-20 DIAGNOSIS — Z87891 Personal history of nicotine dependence: Secondary | ICD-10-CM | POA: Diagnosis not present

## 2022-09-20 DIAGNOSIS — C3432 Malignant neoplasm of lower lobe, left bronchus or lung: Secondary | ICD-10-CM | POA: Diagnosis not present

## 2022-09-20 DIAGNOSIS — Z5111 Encounter for antineoplastic chemotherapy: Secondary | ICD-10-CM | POA: Diagnosis not present

## 2022-09-20 MED FILL — Dexamethasone Sodium Phosphate Inj 100 MG/10ML: INTRAMUSCULAR | Qty: 1 | Status: AC

## 2022-09-20 MED FILL — Fosaprepitant Dimeglumine For IV Infusion 150 MG (Base Eq): INTRAVENOUS | Qty: 5 | Status: AC

## 2022-09-23 ENCOUNTER — Inpatient Hospital Stay: Payer: BC Managed Care – PPO

## 2022-09-23 ENCOUNTER — Other Ambulatory Visit: Payer: Self-pay

## 2022-09-23 ENCOUNTER — Other Ambulatory Visit: Payer: Self-pay | Admitting: *Deleted

## 2022-09-23 ENCOUNTER — Encounter: Payer: Self-pay | Admitting: *Deleted

## 2022-09-23 ENCOUNTER — Ambulatory Visit
Admission: RE | Admit: 2022-09-23 | Discharge: 2022-09-23 | Disposition: A | Discharge status: Home / Self Care | Payer: BC Managed Care – PPO | Source: Ambulatory Visit | Attending: Radiation Oncology | Admitting: Radiation Oncology

## 2022-09-23 VITALS — BP 152/88 | HR 87 | Temp 98.6°F | Resp 18

## 2022-09-23 DIAGNOSIS — Z510 Encounter for antineoplastic radiation therapy: Secondary | ICD-10-CM | POA: Diagnosis not present

## 2022-09-23 DIAGNOSIS — Z87891 Personal history of nicotine dependence: Secondary | ICD-10-CM | POA: Diagnosis not present

## 2022-09-23 DIAGNOSIS — C3432 Malignant neoplasm of lower lobe, left bronchus or lung: Secondary | ICD-10-CM

## 2022-09-23 DIAGNOSIS — Z5111 Encounter for antineoplastic chemotherapy: Secondary | ICD-10-CM | POA: Diagnosis not present

## 2022-09-23 LAB — RAD ONC ARIA SESSION SUMMARY
Course Elapsed Days: 0
Plan Fractions Treated to Date: 1
Plan Prescribed Dose Per Fraction: 2 Gy
Plan Total Fractions Prescribed: 30
Plan Total Prescribed Dose: 60 Gy
Reference Point Dosage Given to Date: 2 Gy
Reference Point Session Dosage Given: 2 Gy
Session Number: 1

## 2022-09-23 LAB — CBC WITH DIFFERENTIAL (CANCER CENTER ONLY)
Abs Immature Granulocytes: 0.15 10*3/uL — ABNORMAL HIGH (ref 0.00–0.07)
Basophils Absolute: 0 10*3/uL (ref 0.0–0.1)
Basophils Relative: 0 %
Eosinophils Absolute: 0.1 10*3/uL (ref 0.0–0.5)
Eosinophils Relative: 0 %
HCT: 46.7 % (ref 39.0–52.0)
Hemoglobin: 15.9 g/dL (ref 13.0–17.0)
Immature Granulocytes: 1 %
Lymphocytes Relative: 6 %
Lymphs Abs: 1.3 10*3/uL (ref 0.7–4.0)
MCH: 29.2 pg (ref 26.0–34.0)
MCHC: 34 g/dL (ref 30.0–36.0)
MCV: 85.8 fL (ref 80.0–100.0)
Monocytes Absolute: 1 10*3/uL (ref 0.1–1.0)
Monocytes Relative: 4 %
Neutro Abs: 20.6 10*3/uL — ABNORMAL HIGH (ref 1.7–7.7)
Neutrophils Relative %: 89 %
Platelet Count: 290 10*3/uL (ref 150–400)
RBC: 5.44 MIL/uL (ref 4.22–5.81)
RDW: 13.1 % (ref 11.5–15.5)
WBC Count: 23.1 10*3/uL — ABNORMAL HIGH (ref 4.0–10.5)
nRBC: 0 % (ref 0.0–0.2)

## 2022-09-23 LAB — CMP (CANCER CENTER ONLY)
ALT: 33 U/L (ref 0–44)
AST: 15 U/L (ref 15–41)
Albumin: 4.5 g/dL (ref 3.5–5.0)
Alkaline Phosphatase: 50 U/L (ref 38–126)
Anion gap: 11 (ref 5–15)
BUN: 10 mg/dL (ref 6–20)
CO2: 24 mmol/L (ref 22–32)
Calcium: 9.5 mg/dL (ref 8.9–10.3)
Chloride: 100 mmol/L (ref 98–111)
Creatinine: 1.03 mg/dL (ref 0.61–1.24)
GFR, Estimated: >60 mL/min (ref >60)
Glucose, Bld: 263 mg/dL — ABNORMAL HIGH (ref 70–99)
Potassium: 3.9 mmol/L (ref 3.5–5.1)
Sodium: 135 mmol/L (ref 135–145)
Total Bilirubin: 0.4 mg/dL (ref 0.3–1.2)
Total Protein: 7.3 g/dL (ref 6.5–8.1)

## 2022-09-23 MED ORDER — SODIUM CHLORIDE 0.9 % IV SOLN
150.0000 mg | Freq: Once | INTRAVENOUS | Status: AC
  Administered 2022-09-23: 150 mg via INTRAVENOUS
  Filled 2022-09-23: qty 150

## 2022-09-23 MED ORDER — LIDOCAINE-PRILOCAINE 2.5-2.5 % EX CREA: TOPICAL_CREAM | CUTANEOUS | 2 refills | Status: DC

## 2022-09-23 MED ORDER — PALONOSETRON HCL INJECTION 0.25 MG/5ML
0.2500 mg | Freq: Once | INTRAVENOUS | Status: AC
  Administered 2022-09-23: 0.25 mg via INTRAVENOUS
  Filled 2022-09-23: qty 5

## 2022-09-23 MED ORDER — SODIUM CHLORIDE 0.9 % IV SOLN: Freq: Once | INTRAVENOUS | Status: AC

## 2022-09-23 MED ORDER — SODIUM CHLORIDE 0.9 % IV SOLN
10.0000 mg | Freq: Once | INTRAVENOUS | Status: AC
  Administered 2022-09-23: 10 mg via INTRAVENOUS
  Filled 2022-09-23: qty 10

## 2022-09-23 MED ORDER — SODIUM CHLORIDE 0.9 % IV SOLN
575.0000 mg | Freq: Once | INTRAVENOUS | Status: AC
  Administered 2022-09-23: 600 mg via INTRAVENOUS
  Filled 2022-09-23: qty 60

## 2022-09-23 MED ORDER — SODIUM CHLORIDE 0.9 % IV SOLN
500.0000 mg/m2 | Freq: Once | INTRAVENOUS | Status: AC
  Administered 2022-09-23: 1000 mg via INTRAVENOUS
  Filled 2022-09-23: qty 40

## 2022-09-23 NOTE — Progress Notes (Signed)
Patient here to initiate chemo. His first radiation appointment is later today. Chemo Education provided to him and his wife in infusion prior to initiation of treatment. This cycle will be given peripherally, but patient is scheduled for port prior to next cycle.  [facility] One testing was QNS. Reviewed these results with patient and educated to need for [facility] testing. Samples drawn and shipped today. Patient information brochure from [employer] given to patient and his wife.   Oncology Nurse Navigator Documentation     09/23/2022    9:00 AM  Oncology Nurse Navigator Flowsheets  Phase of Treatment Chemo/Radiation Concurrent  Chemo/Radiation Concurrent Actual Start Date: 09/23/2022  Navigator Follow Up Date: 10/14/2022  Navigator Follow Up Reason: Follow-up Appointment;Chemotherapy  Navigator Location [facility]  Navigator Encounter Type Treatment  Treatment Initiated Date 09/23/2022  Patient Visit Type MedOnc  Treatment Phase First Chemo Tx;First Radiation Tx  Barriers/Navigation Needs Coordination of Care;Education  Education Other  Interventions Coordination of Care;Education;Psycho-Social Support  Acuity Level 2-Minimal Needs (1-2 Barriers Identified)  Coordination of Care Pathology  Education Method Verbal;Written  Support Groups/Services Friends and Family  Time Spent with Patient 30

## 2022-09-23 NOTE — Progress Notes (Signed)
[place] CSW Progress Note  Holiday representative met with caregiver to further assess needs..Met with patient's wife, Linus Orn, while patient was receiving radiation.  Patient continues working full-time.  She stated all of their basic needs are met.  Discussed advance directives.  Linus Orn will review the documents and call to schedule a time to complete them.  Provided education regarding the process and she said she understood.      Rodman Pickle Avian Konigsberg, LCSW

## 2022-09-23 NOTE — Patient Instructions (Signed)
[facility] [place]  Discharge Instructions: Thank you for choosing [facility] to provide your oncology and hematology care.   If you have a lab appointment with the [facility], please go directly to the [facility] and check in at the registration area.  Wear comfortable clothing and clothing appropriate for easy access to any Portacath or PICC line.   We strive to give you quality time with your provider. You may need to reschedule your appointment if you arrive late (15 or more minutes).  Arriving late affects you and other patients whose appointments are after yours.  Also, if you miss three or more appointments without notifying the office, you may be dismissed from the clinic at the provider's discretion.      For prescription refill requests, have your pharmacy contact our office and allow 72 hours for refills to be completed.    Today you received the following chemotherapy and/or immunotherapy agents Alimta, Carboplatin.     To help prevent nausea and vomiting after your treatment, we encourage you to take your nausea medication as directed.  BELOW ARE SYMPTOMS THAT SHOULD BE REPORTED IMMEDIATELY: *FEVER GREATER THAN 100.4 F (38 C) OR HIGHER *CHILLS OR SWEATING *NAUSEA AND VOMITING THAT IS NOT CONTROLLED WITH YOUR NAUSEA MEDICATION *UNUSUAL SHORTNESS OF BREATH *UNUSUAL BRUISING OR BLEEDING *URINARY PROBLEMS (pain or burning when urinating, or frequent urination) *BOWEL PROBLEMS (unusual diarrhea, constipation, pain near the anus) TENDERNESS IN MOUTH AND THROAT WITH OR WITHOUT PRESENCE OF ULCERS (sore throat, sores in mouth, or a toothache) UNUSUAL RASH, SWELLING OR PAIN  UNUSUAL VAGINAL DISCHARGE OR ITCHING   Items with * indicate a potential emergency and should be followed up as soon as possible or go to the Emergency Department if any problems should occur.  Please show the CHEMOTHERAPY ALERT CARD or IMMUNOTHERAPY ALERT CARD  at check-in to the Emergency Department and triage nurse. Should you have questions after your visit or need to cancel or reschedule your appointment, please contact [facility]  [phone] and follow the prompts.  Office hours are 8:00 a.m. to 4:30 p.m. Monday - Friday. Please note that voicemails left after 4:00 p.m. may not be returned until the following business day.  We are closed weekends and major holidays. You have access to a nurse at all times for urgent questions. Please call the main number to the clinic [phone] and follow the prompts.  For any non-urgent questions, you may also contact your provider using MyChart. We now offer e-Visits for anyone 37 and older to request care online for non-urgent symptoms. For details visit [email or web address].   Also download the MyChart app! Go to the app store, search "MyChart", open the app, select [facility], and log in with your MyChart username and password.

## 2022-09-23 NOTE — Progress Notes (Signed)
Patient in chemotherapy education class today with spouse. Discussed the side effects of Alimta and Carboplatin which include but are not limited to myelosuppression, fatigue, decreased or altered appetite, fever, allergic or infusional reaction, cough, SOB, nausea and vomiting, myalgia and arthralgias, hair loss or thinning, rash, skin dryness, peripheral neuropathy, delayed wound healing, mental changes (chemo brain), increased risk of infections. Reviewed infusion room and office policy and procedure, phone numbers 24 hours x 7 days a week. Reviewed when to call the office with any concerns or problems. Patient is active on MyChart. Scientist, clinical (histocompatibility and immunogenetics) given. Discussed port-a-cath insertion and EMLA cream administration. Antiemetic protocol and chemotherapy schedule reviewed. Patient and wife verbalized understanding of chemotherapy indications and possible side effects. All questions answered. Teachback done.

## 2022-09-24 ENCOUNTER — Other Ambulatory Visit: Payer: Self-pay

## 2022-09-24 ENCOUNTER — Encounter: Payer: Self-pay | Admitting: *Deleted

## 2022-09-24 ENCOUNTER — Ambulatory Visit
Admission: RE | Admit: 2022-09-24 | Discharge: 2022-09-24 | Disposition: A | Discharge status: Home / Self Care | Payer: BC Managed Care – PPO | Source: Ambulatory Visit | Attending: Radiation Oncology | Admitting: Radiation Oncology

## 2022-09-24 DIAGNOSIS — C3432 Malignant neoplasm of lower lobe, left bronchus or lung: Secondary | ICD-10-CM | POA: Diagnosis not present

## 2022-09-24 DIAGNOSIS — Z5111 Encounter for antineoplastic chemotherapy: Secondary | ICD-10-CM | POA: Diagnosis not present

## 2022-09-24 DIAGNOSIS — Z510 Encounter for antineoplastic radiation therapy: Secondary | ICD-10-CM | POA: Diagnosis not present

## 2022-09-24 DIAGNOSIS — Z87891 Personal history of nicotine dependence: Secondary | ICD-10-CM | POA: Diagnosis not present

## 2022-09-24 LAB — RAD ONC ARIA SESSION SUMMARY
Course Elapsed Days: 1
Plan Fractions Treated to Date: 2
Plan Prescribed Dose Per Fraction: 2 Gy
Plan Total Fractions Prescribed: 30
Plan Total Prescribed Dose: 60 Gy
Reference Point Dosage Given to Date: 4 Gy
Reference Point Session Dosage Given: 2 Gy
Session Number: 2

## 2022-09-24 NOTE — Progress Notes (Signed)
Patient needs a note sent to his work stating that he can work without restriction. Spoke to Dr Marin Olp and he approves this letter.  Letter composed, signed and faxed Merlene Pulling to [phone]  Oncology Nurse Navigator Documentation     09/24/2022    2:00 PM  Oncology Nurse Navigator Flowsheets  Navigator Follow Up Date: 10/14/2022  Navigator Follow Up Reason: Follow-up Appointment;Chemotherapy  Navigator Restaurant manager, fast food Encounter Type Telephone;Letter/Fax/Email  Telephone Incoming Call  Patient Visit Type MedOnc  Treatment Phase Active Tx  Barriers/Navigation Needs Coordination of Care;Education  Interventions Other  Acuity Level 2-Minimal Needs (1-2 Barriers Identified)  Support Groups/Services Friends and Family  Time Spent with Patient 15

## 2022-09-25 ENCOUNTER — Other Ambulatory Visit: Payer: Self-pay

## 2022-09-25 ENCOUNTER — Ambulatory Visit
Admission: RE | Admit: 2022-09-25 | Discharge: 2022-09-25 | Disposition: A | Discharge status: Home / Self Care | Payer: BC Managed Care – PPO | Source: Ambulatory Visit | Attending: Radiation Oncology | Admitting: Radiation Oncology

## 2022-09-25 ENCOUNTER — Ambulatory Visit: Payer: BC Managed Care – PPO | Admitting: Dietician

## 2022-09-25 DIAGNOSIS — Z5111 Encounter for antineoplastic chemotherapy: Secondary | ICD-10-CM | POA: Diagnosis not present

## 2022-09-25 DIAGNOSIS — Z510 Encounter for antineoplastic radiation therapy: Secondary | ICD-10-CM | POA: Diagnosis not present

## 2022-09-25 DIAGNOSIS — C3432 Malignant neoplasm of lower lobe, left bronchus or lung: Secondary | ICD-10-CM | POA: Diagnosis not present

## 2022-09-25 DIAGNOSIS — Z87891 Personal history of nicotine dependence: Secondary | ICD-10-CM | POA: Diagnosis not present

## 2022-09-25 LAB — RAD ONC ARIA SESSION SUMMARY
Course Elapsed Days: 2
Plan Fractions Treated to Date: 3
Plan Prescribed Dose Per Fraction: 2 Gy
Plan Total Fractions Prescribed: 30
Plan Total Prescribed Dose: 60 Gy
Reference Point Dosage Given to Date: 6 Gy
Reference Point Session Dosage Given: 2 Gy
Session Number: 3

## 2022-09-25 NOTE — Progress Notes (Signed)
Nutrition Assessment: Reached out to patient at home telephone number which is wife's cell. Wife answered and did most of the talking.    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 He is being treated with concurrent chemo radiation.  His chemo regimen is (every 21 days) Pemetrexed + Carboplatin.   He has a PMHx that includes COPD, HTN, and tobacco use.  Spouse and patient report were on their way to radiation, consult brief.  They deny and NIS. Usual PO intake: Breakfast: pastry or breakfast sandwich Lunch: Peanut butter and jelly or lunch meat sandwich with chips.. Dinner: spouse cooks with meat, veg, starch pattern Fluids: Sweet tea, coffee, milkshake (made with [employer]) fresh juices (green) not a lot of water but spouse is encouraging.   Nutrition Focused Physical Exam: unable to perform NFPE   Medications: MVI   Labs: 09/23/22  Glucose 263   Anthropometrics:   Height: 65" Weight:  09/17/22  173# (increasing) UBW: increasing BMI: 28.79    INTERVENTION:   Relayed that nutrition services are wrap around service provided at no charge and encouraged continued communication if experiencing any nutritional impact symptoms (NIS).  Educated on importance of adequate nourishment with calorie and protein energy intake  with nutrient dense foods when possible to maintain weight/strength and QOL.    Encouraged use of fortified milk as source of calcium and vitamin D.  Suggested increasing fruit and veg to supply Vit C and eliminate need for MVI with additional Beta carotene.   Emailed Nutrition Tip sheet  for  Nutrition During Cancer Treatment with contact information provided.  MONITORING, EVALUATION, GOAL: weight trends, nutrition impact symptoms, PO intake, labs Goal is weight maintenance  Next Visit: PRN at patient or provider request.  April Manson, RDN,  LDN Registered Dietitian, [facility] (Usual office hours: Tuesday-Thursday) Mobile: [phone]

## 2022-09-26 ENCOUNTER — Ambulatory Visit
Admission: RE | Admit: 2022-09-26 | Discharge: 2022-09-26 | Disposition: A | Discharge status: Home / Self Care | Payer: BC Managed Care – PPO | Source: Ambulatory Visit | Attending: Radiation Oncology | Admitting: Radiation Oncology

## 2022-09-26 ENCOUNTER — Other Ambulatory Visit: Payer: Self-pay

## 2022-09-26 ENCOUNTER — Ambulatory Visit: Payer: BC Managed Care – PPO | Admitting: Radiation Oncology

## 2022-09-26 DIAGNOSIS — Z5111 Encounter for antineoplastic chemotherapy: Secondary | ICD-10-CM | POA: Diagnosis not present

## 2022-09-26 DIAGNOSIS — Z87891 Personal history of nicotine dependence: Secondary | ICD-10-CM | POA: Diagnosis not present

## 2022-09-26 DIAGNOSIS — C3432 Malignant neoplasm of lower lobe, left bronchus or lung: Secondary | ICD-10-CM

## 2022-09-26 DIAGNOSIS — Z510 Encounter for antineoplastic radiation therapy: Secondary | ICD-10-CM | POA: Diagnosis not present

## 2022-09-26 LAB — RAD ONC ARIA SESSION SUMMARY
Course Elapsed Days: 3
Plan Fractions Treated to Date: 4
Plan Prescribed Dose Per Fraction: 2 Gy
Plan Total Fractions Prescribed: 30
Plan Total Prescribed Dose: 60 Gy
Reference Point Dosage Given to Date: 8 Gy
Reference Point Session Dosage Given: 2 Gy
Session Number: 4

## 2022-09-26 MED ORDER — SONAFINE EX EMUL
1.0000 | Freq: Once | CUTANEOUS | Status: AC
  Administered 2022-09-26: 1 via TOPICAL

## 2022-09-27 ENCOUNTER — Ambulatory Visit
Admission: RE | Admit: 2022-09-27 | Discharge: 2022-09-27 | Disposition: A | Discharge status: Home / Self Care | Payer: BC Managed Care – PPO | Source: Ambulatory Visit | Attending: Radiation Oncology | Admitting: Radiation Oncology

## 2022-09-27 ENCOUNTER — Other Ambulatory Visit: Payer: Self-pay

## 2022-09-27 ENCOUNTER — Ambulatory Visit: Payer: BC Managed Care – PPO

## 2022-09-27 DIAGNOSIS — C3432 Malignant neoplasm of lower lobe, left bronchus or lung: Secondary | ICD-10-CM | POA: Insufficient documentation

## 2022-09-27 DIAGNOSIS — Z87891 Personal history of nicotine dependence: Secondary | ICD-10-CM | POA: Insufficient documentation

## 2022-09-27 DIAGNOSIS — Z510 Encounter for antineoplastic radiation therapy: Secondary | ICD-10-CM | POA: Diagnosis not present

## 2022-09-27 DIAGNOSIS — Z5111 Encounter for antineoplastic chemotherapy: Secondary | ICD-10-CM | POA: Insufficient documentation

## 2022-09-27 LAB — RAD ONC ARIA SESSION SUMMARY
Course Elapsed Days: 4
Plan Fractions Treated to Date: 5
Plan Prescribed Dose Per Fraction: 2 Gy
Plan Total Fractions Prescribed: 30
Plan Total Prescribed Dose: 60 Gy
Reference Point Dosage Given to Date: 10 Gy
Reference Point Session Dosage Given: 2 Gy
Session Number: 5

## 2022-09-30 ENCOUNTER — Ambulatory Visit: Payer: BC Managed Care – PPO

## 2022-09-30 ENCOUNTER — Other Ambulatory Visit: Payer: Self-pay

## 2022-09-30 ENCOUNTER — Ambulatory Visit
Admission: RE | Admit: 2022-09-30 | Discharge: 2022-09-30 | Disposition: A | Discharge status: Home / Self Care | Payer: BC Managed Care – PPO | Source: Ambulatory Visit | Attending: Radiation Oncology | Admitting: Radiation Oncology

## 2022-09-30 DIAGNOSIS — Z5111 Encounter for antineoplastic chemotherapy: Secondary | ICD-10-CM | POA: Diagnosis not present

## 2022-09-30 DIAGNOSIS — Z87891 Personal history of nicotine dependence: Secondary | ICD-10-CM | POA: Diagnosis not present

## 2022-09-30 DIAGNOSIS — C3432 Malignant neoplasm of lower lobe, left bronchus or lung: Secondary | ICD-10-CM | POA: Diagnosis not present

## 2022-09-30 DIAGNOSIS — Z510 Encounter for antineoplastic radiation therapy: Secondary | ICD-10-CM | POA: Diagnosis not present

## 2022-09-30 LAB — RAD ONC ARIA SESSION SUMMARY
Course Elapsed Days: 7
Plan Fractions Treated to Date: 6
Plan Prescribed Dose Per Fraction: 2 Gy
Plan Total Fractions Prescribed: 30
Plan Total Prescribed Dose: 60 Gy
Reference Point Dosage Given to Date: 12 Gy
Reference Point Session Dosage Given: 2 Gy
Session Number: 6

## 2022-10-01 ENCOUNTER — Ambulatory Visit
Admission: RE | Admit: 2022-10-01 | Discharge: 2022-10-01 | Disposition: A | Discharge status: Home / Self Care | Payer: BC Managed Care – PPO | Source: Ambulatory Visit | Attending: Radiation Oncology | Admitting: Radiation Oncology

## 2022-10-01 ENCOUNTER — Other Ambulatory Visit: Payer: Self-pay

## 2022-10-01 ENCOUNTER — Ambulatory Visit: Payer: BC Managed Care – PPO

## 2022-10-01 DIAGNOSIS — Z510 Encounter for antineoplastic radiation therapy: Secondary | ICD-10-CM | POA: Diagnosis not present

## 2022-10-01 DIAGNOSIS — Z5111 Encounter for antineoplastic chemotherapy: Secondary | ICD-10-CM | POA: Diagnosis not present

## 2022-10-01 DIAGNOSIS — C3432 Malignant neoplasm of lower lobe, left bronchus or lung: Secondary | ICD-10-CM | POA: Diagnosis not present

## 2022-10-01 DIAGNOSIS — Z87891 Personal history of nicotine dependence: Secondary | ICD-10-CM | POA: Diagnosis not present

## 2022-10-01 LAB — RAD ONC ARIA SESSION SUMMARY
Course Elapsed Days: 8
Plan Fractions Treated to Date: 7
Plan Prescribed Dose Per Fraction: 2 Gy
Plan Total Fractions Prescribed: 30
Plan Total Prescribed Dose: 60 Gy
Reference Point Dosage Given to Date: 14 Gy
Reference Point Session Dosage Given: 2 Gy
Session Number: 7

## 2022-10-02 ENCOUNTER — Ambulatory Visit: Payer: BC Managed Care – PPO

## 2022-10-02 ENCOUNTER — Other Ambulatory Visit: Payer: Self-pay | Admitting: Radiology

## 2022-10-02 ENCOUNTER — Other Ambulatory Visit: Payer: Self-pay

## 2022-10-02 ENCOUNTER — Other Ambulatory Visit (HOSPITAL_COMMUNITY): Payer: Self-pay | Admitting: Physician Assistant

## 2022-10-02 ENCOUNTER — Ambulatory Visit
Admission: RE | Admit: 2022-10-02 | Discharge: 2022-10-02 | Disposition: A | Discharge status: Home / Self Care | Payer: BC Managed Care – PPO | Source: Ambulatory Visit | Attending: Radiation Oncology | Admitting: Radiation Oncology

## 2022-10-02 DIAGNOSIS — Z87891 Personal history of nicotine dependence: Secondary | ICD-10-CM | POA: Diagnosis not present

## 2022-10-02 DIAGNOSIS — Z510 Encounter for antineoplastic radiation therapy: Secondary | ICD-10-CM | POA: Diagnosis not present

## 2022-10-02 DIAGNOSIS — Z5111 Encounter for antineoplastic chemotherapy: Secondary | ICD-10-CM | POA: Diagnosis not present

## 2022-10-02 DIAGNOSIS — C3432 Malignant neoplasm of lower lobe, left bronchus or lung: Secondary | ICD-10-CM | POA: Diagnosis not present

## 2022-10-02 LAB — RAD ONC ARIA SESSION SUMMARY
Course Elapsed Days: 9
Plan Fractions Treated to Date: 8
Plan Prescribed Dose Per Fraction: 2 Gy
Plan Total Fractions Prescribed: 30
Plan Total Prescribed Dose: 60 Gy
Reference Point Dosage Given to Date: 16 Gy
Reference Point Session Dosage Given: 2 Gy
Session Number: 8

## 2022-10-02 NOTE — Consult Note (Signed)
Chief Complaint: Patient was seen in consultation today for Port-A-Cath placement  Referring Physician(s): [name],Peter R  Supervising Physician: Daryll Brod  Patient Status: [name]  History of Present Illness: Brandon Phillips is a 57 y.o. male, ex-smoker, with past medical history of COPD, eczema, hyperlipidemia and newly diagnosed locally advanced left lung adenocarcinoma.  He is scheduled today for Port-A-Cath placement to assist with treatment.  Past Medical History:  Diagnosis Date   Cancer ([place])    lung cancer   CAP (community acquired pneumonia)    COPD (chronic obstructive pulmonary disease) ([place])    Eczema    HLD (hyperlipidemia)    Primary cancer of left lower lobe of lung ([place]) 09/17/2022   Tobacco use disorder 01/24/2017    Past Surgical History:  Procedure Laterality Date   BRONCHIAL BIOPSY  08/29/2022   Procedure: BRONCHIAL BIOPSIES;  Surgeon: [name], MD;  Location: [facility];  Service: Pulmonary;;   BRONCHIAL BRUSHINGS  08/29/2022   Procedure: BRONCHIAL BRUSHINGS;  Surgeon: [name], MD;  Location: [facility];  Service: Pulmonary;;   BRONCHIAL NEEDLE ASPIRATION BIOPSY  08/29/2022   Procedure: BRONCHIAL NEEDLE ASPIRATION BIOPSIES;  Surgeon: [name], MD;  Location: [facility];  Service: Pulmonary;;   BRONCHIAL WASHINGS  08/29/2022   Procedure: BRONCHIAL WASHINGS;  Surgeon: [name], MD;  Location: [facility];  Service: Pulmonary;;   FIDUCIAL MARKER PLACEMENT  08/29/2022   Procedure: FIDUCIAL MARKER PLACEMENT;  Surgeon: [name], MD;  Location: [facility] ENDOSCOPY;  Service: Pulmonary;;   HERNIA REPAIR     as an infant   [place]  08/29/2022   Procedure: VIDEO BRONCHOSCOPY WITH ENDOBRONCHIAL ULTRASOUND;  Surgeon: [name], MD;  Location: [facility] ENDOSCOPY;  Service: Pulmonary;;    Allergies: Patient has no known allergies.  Medications: Prior to Admission  medications   Medication Sig Start Date End Date Taking? Authorizing Provider  albuterol (VENTOLIN HFA) 108 (90 Base) MCG/ACT inhaler Inhale 2 puffs into the lungs every 6 (six) hours as needed for wheezing. 01/04/22   Samuel Bouche, NP  aspirin EC 81 MG tablet Take 81 mg by mouth every evening. Swallow whole.    [provider]  atorvastatin (LIPITOR) 20 MG tablet TAKE ONE TABLET BY MOUTH DAILY 07/08/22   Samuel Bouche, NP  budesonide-formoterol (SYMBICORT) 160-4.5 MCG/ACT inhaler Inhale 2 puffs into the lungs 2 (two) times daily. 07/08/22   Samuel Bouche, NP  COENZYME Q10 PO Take 100 mg by mouth in the morning.    [provider]  dexamethasone (DECADRON) 4 MG tablet Take 1 tab 2 times daily starting day before pemetrexed. Then take 2 tabs daily x 3 days starting day after carboplatin. Take with food. 09/17/22   Volanda Napoleon, MD  folic acid (FOLVITE) 1 MG tablet Take 1 tablet (1 mg total) by mouth daily. Start 7 days before pemetrexed chemotherapy. Continue until 21 days after pemetrexed completed. 09/17/22   Volanda Napoleon, MD  GUAIFENESIN 1200 PO Take 1,200 mg by mouth in the morning and at bedtime.    [provider]  lidocaine-prilocaine (EMLA) cream Apply a dime size to port-a-cath 1-2 hours prior to access. Cover with saran wrap. 09/23/22   Volanda Napoleon, MD  Multiple Vitamin (MULTIVITAMIN WITH MINERALS) TABS tablet Take 1 tablet by mouth every evening.    [provider]  ondansetron (ZOFRAN) 8 MG tablet Take 1 tablet (8 mg total) by mouth every 8 (eight) hours  as needed for nausea or vomiting. Start on the third day after carboplatin. 09/17/22   Volanda Napoleon, MD  prochlorperazine (COMPAZINE) 10 MG tablet Take 1 tablet (10 mg total) by mouth every 6 (six) hours as needed for nausea or vomiting. 09/17/22   Volanda Napoleon, MD     No family history on file.  Social History   Socioeconomic History   Marital status: Married    Spouse name: Not on file    Number of children: Not on file   Years of education: Not on file   Highest education level: Not on file  Occupational History   Not on file  Tobacco Use   Smoking status: Former    Packs/day: 1.50    Years: 35.00    Total pack years: 52.50    Types: Cigarettes    Quit date: 03/30/2017    Years since quitting: 5.5   Smokeless tobacco: Never  Vaping Use   Vaping Use: Every day   Substances: Nicotine  Substance and Sexual Activity   Alcohol use: No   Drug use: No   Sexual activity: Yes  Other Topics Concern   Not on file  Social History Narrative   Not on file   Social Determinants of Health   Financial Resource Strain: Not on file  Food Insecurity: No Food Insecurity (09/05/2022)   Hunger Vital Sign    Worried About Running Out of Food in the Last Year: Never true    Ran Out of Food in the Last Year: Never true  Transportation Needs: No Transportation Needs (09/05/2022)   PRAPARE - Hydrologist (Medical): No    Lack of Transportation (Non-Medical): No  Physical Activity: Not on file  Stress: Not on file  Social Connections: Not on file      Review of Systems  Vital Signs:  Code Status:    Physical Exam  Imaging: ECHOCARDIOGRAM COMPLETE  Result Date: 09/10/2022    ECHOCARDIOGRAM REPORT   Patient Name:   Brandon Phillips Date of Exam: 09/10/2022 Medical Rec #:  [id number]     Height:       65.0 in Accession #:    [id number]    Weight:       168.0 lb Date of Birth:  02/15/1966    BSA:          1.837 m Patient Age:    58 years      BP:           133/80 mmHg Patient Gender: M             HR:           90 bpm. Exam Location:  [facility] Procedure: 2D Echo, Cardiac Doppler, Color Doppler, 3D Echo and Strain Analysis Indications:    [place] R06.00  History:        Patient has no prior history of Echocardiogram examinations.                 COPD and Lung Cancer; Risk Factors:Dyslipidemia and Former                 Smoker.  Sonographer:    Mikki Santee RDCS Referring Phys: [id number] [facility]  1. Mild intracavitary gradient. Peak velocity 0.96 m/s. Peak gradient 3.7 mmHg. Left ventricular ejection fraction, by estimation, is 65 to 70%. The left ventricle has normal function. The left ventricle has no regional wall motion abnormalities. Left  ventricular diastolic parameters were normal. The average left ventricular global longitudinal strain is -19.4 %. The global longitudinal strain is normal.  2. Right ventricular systolic function is normal. The right ventricular size is normal. There is normal pulmonary artery systolic pressure.  3. The mitral valve is normal in structure. No evidence of mitral valve regurgitation. No evidence of mitral stenosis.  4. The aortic valve is tricuspid. Aortic valve regurgitation is not visualized. No aortic stenosis is present.  5. The inferior vena cava is normal in size with greater than 50% respiratory variability, suggesting right atrial pressure of 3 mmHg. FINDINGS  Left Ventricle: Mild intracavitary gradient. Peak velocity 0.96 m/s. Peak gradient 3.7 mmHg. Left ventricular ejection fraction, by estimation, is 65 to 70%. The left ventricle has normal function. The left ventricle has no regional wall motion abnormalities. The average left ventricular global longitudinal strain is -19.4 %. The global longitudinal strain is normal. The left ventricular internal cavity size was normal in size. There is no left ventricular hypertrophy. Left ventricular diastolic parameters were normal. Normal left ventricular filling pressure. Right Ventricle: The right ventricular size is normal. No increase in right ventricular wall thickness. Right ventricular systolic function is normal. There is normal pulmonary artery systolic pressure. The tricuspid regurgitant velocity is 2.44 m/s, and  with an assumed right atrial pressure of 3 mmHg, the estimated right ventricular systolic pressure is 0000000 mmHg. Left Atrium:  Left atrial size was normal in size. Right Atrium: Right atrial size was normal in size. Pericardium: There is no evidence of pericardial effusion. Mitral Valve: The mitral valve is normal in structure. No evidence of mitral valve regurgitation. No evidence of mitral valve stenosis. Tricuspid Valve: The tricuspid valve is normal in structure. Tricuspid valve regurgitation is trivial. No evidence of tricuspid stenosis. Aortic Valve: The aortic valve is tricuspid. Aortic valve regurgitation is not visualized. No aortic stenosis is present. Pulmonic Valve: The pulmonic valve was normal in structure. Pulmonic valve regurgitation is not visualized. No evidence of pulmonic stenosis. Aorta: The aortic root is normal in size and structure. Venous: The inferior vena cava is normal in size with greater than 50% respiratory variability, suggesting right atrial pressure of 3 mmHg. IAS/Shunts: No atrial level shunt detected by color flow Doppler.  LEFT VENTRICLE PLAX 2D LVIDd:         4.10 cm   Diastology LVIDs:         2.40 cm   LV e' medial:    10.00 cm/s LV PW:         0.90 cm   LV E/e' medial:  9.7 LV IVS:        0.80 cm   LV e' lateral:   14.10 cm/s LVOT diam:     2.20 cm   LV E/e' lateral: 6.9 LV SV:         87 LV SV Index:   47        2D Longitudinal Strain LVOT Area:     3.80 cm  2D Strain GLS (A2C):   -23.9 %                          2D Strain GLS (A3C):   -19.5 %                          2D Strain GLS (A4C):   -14.9 %  2D Strain GLS Avg:     -19.4 %                           3D Volume EF:                          3D EF:        60 %                          LV EDV:       92 ml                          LV ESV:       37 ml                          LV SV:        55 ml RIGHT VENTRICLE RV Basal diam:  3.90 cm RV Mid diam:    2.90 cm RV S prime:     18.00 cm/s TAPSE (M-mode): 2.0 cm LEFT ATRIUM             Index        RIGHT ATRIUM          Index LA diam:        3.10 cm 1.69 cm/m   RA Area:     9.69  cm LA Vol (A2C):   28.5 ml 15.52 ml/m  RA Volume:   19.70 ml 10.72 ml/m LA Vol (A4C):   21.2 ml 11.54 ml/m LA Biplane Vol: 25.6 ml 13.94 ml/m  AORTIC VALVE LVOT Vmax:   134.00 cm/s LVOT Vmean:  90.500 cm/s LVOT VTI:    0.228 m  AORTA Ao Root diam: 3.50 cm Ao Asc diam:  3.40 cm MITRAL VALVE               TRICUSPID VALVE MV Area (PHT): 5.02 cm    TR Peak grad:   23.8 mmHg MV Decel Time: 151 msec    TR Vmax:        244.00 cm/s MV E velocity: 96.90 cm/s MV A velocity: 65.00 cm/s  SHUNTS MV E/A ratio:  1.49        Systemic VTI:  0.23 m                            Systemic Diam: 2.20 cm Skeet Latch MD Electronically signed by Skeet Latch MD Signature Date/Time: 09/10/2022/7:02:09 PM    Final     Labs:  CBC: Recent Labs    01/11/22 0000 08/16/22 1358 09/17/22 0945 09/23/22 0847  WBC 9.4 10.8* 9.6 23.1*  HGB 16.5 16.2 16.0 15.9  HCT 48.1 48.8 47.8 46.7  PLT 256 278 277 290    COAGS: No results for input(s): "INR", "APTT" in the last 8760 hours.  BMP: Recent Labs    01/11/22 0000 08/16/22 1358 09/17/22 0945 09/23/22 0847  NA 140 142 140 135  K 4.4 4.4 4.9 3.9  CL 100 98 100 100  CO2 29 35* 30 24  GLUCOSE 102* 93 104* 263*  BUN '14 12 12 10  '$ CALCIUM 9.8 10.3 9.4 9.5  CREATININE 1.05 1.17 1.07 1.03  GFRNONAA  --  >60 >60 >60    LIVER FUNCTION  TESTS: Recent Labs    01/11/22 0000 08/16/22 1358 09/17/22 0945 09/23/22 0847  BILITOT 0.5 0.5 0.7 0.4  AST '19 21 26 15  '$ ALT 26 30 41 33  ALKPHOS  --  60 53 50  PROT 7.3 8.0 7.8 7.3  ALBUMIN  --  4.9 4.1 4.5    TUMOR MARKERS: No results for input(s): "AFPTM", "CEA", "CA199", "CHROMGRNA" in the last 8760 hours.  Assessment and Plan: 57 y.o. male, ex-smoker, with past medical history of COPD, eczema, hyperlipidemia and newly diagnosed locally advanced left lung adenocarcinoma.  He is scheduled today for Port-A-Cath placement to assist with treatment.Risks and benefits of image guided port-a-catheter placement was  discussed with the patient including, but not limited to bleeding, infection, pneumothorax, or fibrin sheath development and need for additional procedures.  All of the patient's questions were answered, patient is agreeable to proceed. Consent signed and in chart.    Thank you for this interesting consult.  I greatly enjoyed meeting Javares Carlise and look forward to participating in their care.  A copy of this report was sent to the requesting provider on this date.  Electronically Signed: D. Rowe Robert, [name] 10/02/2022, 1:24 PM   I spent a total of  25 minutes   in face to face in clinical consultation, greater than 50% of which was counseling/coordinating care for port a cath placement

## 2022-10-03 ENCOUNTER — Other Ambulatory Visit: Payer: Self-pay

## 2022-10-03 ENCOUNTER — Ambulatory Visit (HOSPITAL_COMMUNITY)
Admission: RE | Admit: 2022-10-03 | Discharge: 2022-10-03 | Disposition: A | Discharge status: Home / Self Care | Payer: BC Managed Care – PPO | Source: Ambulatory Visit | Attending: Hematology & Oncology | Admitting: Hematology & Oncology

## 2022-10-03 ENCOUNTER — Ambulatory Visit
Admission: RE | Admit: 2022-10-03 | Discharge: 2022-10-03 | Disposition: A | Discharge status: Home / Self Care | Payer: BC Managed Care – PPO | Source: Ambulatory Visit | Attending: Radiation Oncology | Admitting: Radiation Oncology

## 2022-10-03 ENCOUNTER — Encounter (HOSPITAL_COMMUNITY): Payer: Self-pay

## 2022-10-03 ENCOUNTER — Ambulatory Visit: Payer: BC Managed Care – PPO

## 2022-10-03 DIAGNOSIS — J449 Chronic obstructive pulmonary disease, unspecified: Secondary | ICD-10-CM | POA: Insufficient documentation

## 2022-10-03 DIAGNOSIS — F17290 Nicotine dependence, other tobacco product, uncomplicated: Secondary | ICD-10-CM | POA: Insufficient documentation

## 2022-10-03 DIAGNOSIS — E785 Hyperlipidemia, unspecified: Secondary | ICD-10-CM | POA: Diagnosis not present

## 2022-10-03 DIAGNOSIS — Z87891 Personal history of nicotine dependence: Secondary | ICD-10-CM | POA: Diagnosis not present

## 2022-10-03 DIAGNOSIS — Z452 Encounter for adjustment and management of vascular access device: Secondary | ICD-10-CM | POA: Diagnosis not present

## 2022-10-03 DIAGNOSIS — C3432 Malignant neoplasm of lower lobe, left bronchus or lung: Secondary | ICD-10-CM | POA: Diagnosis not present

## 2022-10-03 DIAGNOSIS — Z5111 Encounter for antineoplastic chemotherapy: Secondary | ICD-10-CM | POA: Diagnosis not present

## 2022-10-03 DIAGNOSIS — Z510 Encounter for antineoplastic radiation therapy: Secondary | ICD-10-CM | POA: Diagnosis not present

## 2022-10-03 DIAGNOSIS — C3490 Malignant neoplasm of unspecified part of unspecified bronchus or lung: Secondary | ICD-10-CM | POA: Diagnosis not present

## 2022-10-03 HISTORY — PX: IR IMAGING GUIDED PORT INSERTION: IMG5740

## 2022-10-03 LAB — RAD ONC ARIA SESSION SUMMARY
Course Elapsed Days: 10
Plan Fractions Treated to Date: 9
Plan Prescribed Dose Per Fraction: 2 Gy
Plan Total Fractions Prescribed: 30
Plan Total Prescribed Dose: 60 Gy
Reference Point Dosage Given to Date: 18 Gy
Reference Point Session Dosage Given: 2 Gy
Session Number: 9

## 2022-10-03 MED ORDER — SODIUM CHLORIDE 0.9 % IV SOLN: INTRAVENOUS | Status: DC

## 2022-10-03 MED ORDER — MIDAZOLAM HCL 2 MG/2ML IJ SOLN
INTRAMUSCULAR | Status: AC
  Filled 2022-10-03: qty 2

## 2022-10-03 MED ORDER — FENTANYL CITRATE (PF) 100 MCG/2ML IJ SOLN
INTRAMUSCULAR | Status: AC | PRN
  Administered 2022-10-03 (×2): 50 ug via INTRAVENOUS

## 2022-10-03 MED ORDER — HEPARIN SOD (PORK) LOCK FLUSH 100 UNIT/ML IV SOLN
INTRAVENOUS | Status: AC
  Administered 2022-10-03: 500 [IU]
  Filled 2022-10-03: qty 5

## 2022-10-03 MED ORDER — FENTANYL CITRATE (PF) 100 MCG/2ML IJ SOLN
INTRAMUSCULAR | Status: AC
  Filled 2022-10-03: qty 2

## 2022-10-03 MED ORDER — LIDOCAINE-EPINEPHRINE 1 %-1:100000 IJ SOLN
INTRAMUSCULAR | Status: AC
  Administered 2022-10-03: 10 mL via INTRADERMAL
  Filled 2022-10-03: qty 1

## 2022-10-03 MED ORDER — MIDAZOLAM HCL 2 MG/2ML IJ SOLN
INTRAMUSCULAR | Status: AC | PRN
  Administered 2022-10-03 (×2): 1 mg via INTRAVENOUS

## 2022-10-03 NOTE — Discharge Instructions (Signed)
  Please call Interventional Radiology clinic [phone] with any questions or concerns.   You may remove your dressing and shower tomorrow.  Do not use EMLA cream for 2 weeks after port placement. Cream will remove surgical glue in your incision.

## 2022-10-03 NOTE — Procedures (Signed)
Interventional Radiology Procedure Note  Procedure: RT IJ POWER PORT    Complications: None  Estimated Blood Loss:  MIN  Findings: TIP SVCRA    M. TREVOR Yamir Carignan, MD    

## 2022-10-04 ENCOUNTER — Other Ambulatory Visit: Payer: Self-pay

## 2022-10-04 ENCOUNTER — Ambulatory Visit
Admission: RE | Admit: 2022-10-04 | Discharge: 2022-10-04 | Disposition: A | Discharge status: Home / Self Care | Payer: BC Managed Care – PPO | Source: Ambulatory Visit | Attending: Radiation Oncology | Admitting: Radiation Oncology

## 2022-10-04 ENCOUNTER — Ambulatory Visit: Payer: BC Managed Care – PPO

## 2022-10-04 DIAGNOSIS — Z5111 Encounter for antineoplastic chemotherapy: Secondary | ICD-10-CM | POA: Diagnosis not present

## 2022-10-04 DIAGNOSIS — C3432 Malignant neoplasm of lower lobe, left bronchus or lung: Secondary | ICD-10-CM | POA: Diagnosis not present

## 2022-10-04 DIAGNOSIS — Z510 Encounter for antineoplastic radiation therapy: Secondary | ICD-10-CM | POA: Diagnosis not present

## 2022-10-04 DIAGNOSIS — Z87891 Personal history of nicotine dependence: Secondary | ICD-10-CM | POA: Diagnosis not present

## 2022-10-04 LAB — RAD ONC ARIA SESSION SUMMARY
Course Elapsed Days: 11
Plan Fractions Treated to Date: 10
Plan Prescribed Dose Per Fraction: 2 Gy
Plan Total Fractions Prescribed: 30
Plan Total Prescribed Dose: 60 Gy
Reference Point Dosage Given to Date: 20 Gy
Reference Point Session Dosage Given: 2 Gy
Session Number: 10

## 2022-10-07 ENCOUNTER — Other Ambulatory Visit: Payer: Self-pay

## 2022-10-07 ENCOUNTER — Ambulatory Visit
Admission: RE | Admit: 2022-10-07 | Discharge: 2022-10-07 | Disposition: A | Discharge status: Home / Self Care | Payer: BC Managed Care – PPO | Source: Ambulatory Visit | Attending: Radiation Oncology | Admitting: Radiation Oncology

## 2022-10-07 ENCOUNTER — Ambulatory Visit: Payer: BC Managed Care – PPO

## 2022-10-07 DIAGNOSIS — Z87891 Personal history of nicotine dependence: Secondary | ICD-10-CM | POA: Diagnosis not present

## 2022-10-07 DIAGNOSIS — Z510 Encounter for antineoplastic radiation therapy: Secondary | ICD-10-CM | POA: Diagnosis not present

## 2022-10-07 DIAGNOSIS — C3432 Malignant neoplasm of lower lobe, left bronchus or lung: Secondary | ICD-10-CM | POA: Diagnosis not present

## 2022-10-07 DIAGNOSIS — Z5111 Encounter for antineoplastic chemotherapy: Secondary | ICD-10-CM | POA: Diagnosis not present

## 2022-10-07 LAB — RAD ONC ARIA SESSION SUMMARY
Course Elapsed Days: 14
Plan Fractions Treated to Date: 11
Plan Prescribed Dose Per Fraction: 2 Gy
Plan Total Fractions Prescribed: 30
Plan Total Prescribed Dose: 60 Gy
Reference Point Dosage Given to Date: 22 Gy
Reference Point Session Dosage Given: 2 Gy
Session Number: 11

## 2022-10-08 ENCOUNTER — Ambulatory Visit: Payer: BC Managed Care – PPO

## 2022-10-08 ENCOUNTER — Ambulatory Visit
Admission: RE | Admit: 2022-10-08 | Discharge: 2022-10-08 | Disposition: A | Discharge status: Home / Self Care | Payer: BC Managed Care – PPO | Source: Ambulatory Visit | Attending: Radiation Oncology | Admitting: Radiation Oncology

## 2022-10-08 ENCOUNTER — Other Ambulatory Visit: Payer: Self-pay

## 2022-10-08 DIAGNOSIS — C3432 Malignant neoplasm of lower lobe, left bronchus or lung: Secondary | ICD-10-CM | POA: Diagnosis not present

## 2022-10-08 DIAGNOSIS — Z87891 Personal history of nicotine dependence: Secondary | ICD-10-CM | POA: Diagnosis not present

## 2022-10-08 DIAGNOSIS — Z5111 Encounter for antineoplastic chemotherapy: Secondary | ICD-10-CM | POA: Diagnosis not present

## 2022-10-08 DIAGNOSIS — Z510 Encounter for antineoplastic radiation therapy: Secondary | ICD-10-CM | POA: Diagnosis not present

## 2022-10-08 LAB — RAD ONC ARIA SESSION SUMMARY
Course Elapsed Days: 15
Plan Fractions Treated to Date: 12
Plan Prescribed Dose Per Fraction: 2 Gy
Plan Total Fractions Prescribed: 30
Plan Total Prescribed Dose: 60 Gy
Reference Point Dosage Given to Date: 24 Gy
Reference Point Session Dosage Given: 2 Gy
Session Number: 12

## 2022-10-09 ENCOUNTER — Other Ambulatory Visit: Payer: Self-pay

## 2022-10-09 ENCOUNTER — Ambulatory Visit: Payer: BC Managed Care – PPO

## 2022-10-09 ENCOUNTER — Ambulatory Visit
Admission: RE | Admit: 2022-10-09 | Discharge: 2022-10-09 | Disposition: A | Discharge status: Home / Self Care | Payer: BC Managed Care – PPO | Source: Ambulatory Visit | Attending: Radiation Oncology | Admitting: Radiation Oncology

## 2022-10-09 DIAGNOSIS — Z87891 Personal history of nicotine dependence: Secondary | ICD-10-CM | POA: Diagnosis not present

## 2022-10-09 DIAGNOSIS — Z5111 Encounter for antineoplastic chemotherapy: Secondary | ICD-10-CM | POA: Diagnosis not present

## 2022-10-09 DIAGNOSIS — Z510 Encounter for antineoplastic radiation therapy: Secondary | ICD-10-CM | POA: Diagnosis not present

## 2022-10-09 DIAGNOSIS — C3432 Malignant neoplasm of lower lobe, left bronchus or lung: Secondary | ICD-10-CM | POA: Diagnosis not present

## 2022-10-09 LAB — RAD ONC ARIA SESSION SUMMARY
Course Elapsed Days: 16
Plan Fractions Treated to Date: 13
Plan Prescribed Dose Per Fraction: 2 Gy
Plan Total Fractions Prescribed: 30
Plan Total Prescribed Dose: 60 Gy
Reference Point Dosage Given to Date: 26 Gy
Reference Point Session Dosage Given: 2 Gy
Session Number: 13

## 2022-10-10 ENCOUNTER — Ambulatory Visit
Admission: RE | Admit: 2022-10-10 | Discharge: 2022-10-10 | Disposition: A | Discharge status: Home / Self Care | Payer: BC Managed Care – PPO | Source: Ambulatory Visit | Attending: Radiation Oncology | Admitting: Radiation Oncology

## 2022-10-10 ENCOUNTER — Ambulatory Visit: Payer: BC Managed Care – PPO

## 2022-10-10 ENCOUNTER — Other Ambulatory Visit: Payer: Self-pay

## 2022-10-10 DIAGNOSIS — Z5111 Encounter for antineoplastic chemotherapy: Secondary | ICD-10-CM | POA: Diagnosis not present

## 2022-10-10 DIAGNOSIS — C3432 Malignant neoplasm of lower lobe, left bronchus or lung: Secondary | ICD-10-CM | POA: Diagnosis not present

## 2022-10-10 DIAGNOSIS — Z87891 Personal history of nicotine dependence: Secondary | ICD-10-CM | POA: Diagnosis not present

## 2022-10-10 DIAGNOSIS — Z510 Encounter for antineoplastic radiation therapy: Secondary | ICD-10-CM | POA: Diagnosis not present

## 2022-10-10 LAB — RAD ONC ARIA SESSION SUMMARY
Course Elapsed Days: 17
Plan Fractions Treated to Date: 14
Plan Prescribed Dose Per Fraction: 2 Gy
Plan Total Fractions Prescribed: 30
Plan Total Prescribed Dose: 60 Gy
Reference Point Dosage Given to Date: 28 Gy
Reference Point Session Dosage Given: 2 Gy
Session Number: 14

## 2022-10-11 ENCOUNTER — Ambulatory Visit
Admission: RE | Admit: 2022-10-11 | Discharge: 2022-10-11 | Disposition: A | Discharge status: Home / Self Care | Payer: BC Managed Care – PPO | Source: Ambulatory Visit | Attending: Radiation Oncology | Admitting: Radiation Oncology

## 2022-10-11 ENCOUNTER — Ambulatory Visit: Payer: BC Managed Care – PPO

## 2022-10-11 ENCOUNTER — Other Ambulatory Visit: Payer: Self-pay

## 2022-10-11 DIAGNOSIS — C3432 Malignant neoplasm of lower lobe, left bronchus or lung: Secondary | ICD-10-CM | POA: Diagnosis not present

## 2022-10-11 DIAGNOSIS — Z87891 Personal history of nicotine dependence: Secondary | ICD-10-CM | POA: Diagnosis not present

## 2022-10-11 DIAGNOSIS — Z5111 Encounter for antineoplastic chemotherapy: Secondary | ICD-10-CM | POA: Diagnosis not present

## 2022-10-11 DIAGNOSIS — Z510 Encounter for antineoplastic radiation therapy: Secondary | ICD-10-CM | POA: Diagnosis not present

## 2022-10-11 LAB — RAD ONC ARIA SESSION SUMMARY
Course Elapsed Days: 18
Plan Fractions Treated to Date: 15
Plan Prescribed Dose Per Fraction: 2 Gy
Plan Total Fractions Prescribed: 30
Plan Total Prescribed Dose: 60 Gy
Reference Point Dosage Given to Date: 30 Gy
Reference Point Session Dosage Given: 2 Gy
Session Number: 15

## 2022-10-14 ENCOUNTER — Ambulatory Visit: Payer: BC Managed Care – PPO

## 2022-10-14 ENCOUNTER — Inpatient Hospital Stay: Payer: BC Managed Care – PPO

## 2022-10-14 ENCOUNTER — Other Ambulatory Visit: Payer: Self-pay

## 2022-10-14 ENCOUNTER — Inpatient Hospital Stay (HOSPITAL_BASED_OUTPATIENT_CLINIC_OR_DEPARTMENT_OTHER): Payer: BC Managed Care – PPO | Admitting: Hematology & Oncology

## 2022-10-14 ENCOUNTER — Ambulatory Visit
Admission: RE | Admit: 2022-10-14 | Discharge: 2022-10-14 | Disposition: A | Discharge status: Home / Self Care | Payer: BC Managed Care – PPO | Source: Ambulatory Visit | Attending: Radiation Oncology | Admitting: Radiation Oncology

## 2022-10-14 ENCOUNTER — Encounter: Payer: Self-pay | Admitting: Hematology & Oncology

## 2022-10-14 VITALS — BP 123/82 | HR 79

## 2022-10-14 VITALS — BP 152/101 | HR 94 | Temp 98.1°F | Resp 20 | Ht 65.0 in | Wt 166.0 lb

## 2022-10-14 DIAGNOSIS — C3432 Malignant neoplasm of lower lobe, left bronchus or lung: Secondary | ICD-10-CM

## 2022-10-14 DIAGNOSIS — Z510 Encounter for antineoplastic radiation therapy: Secondary | ICD-10-CM | POA: Diagnosis not present

## 2022-10-14 DIAGNOSIS — Z5111 Encounter for antineoplastic chemotherapy: Secondary | ICD-10-CM | POA: Insufficient documentation

## 2022-10-14 DIAGNOSIS — Z87891 Personal history of nicotine dependence: Secondary | ICD-10-CM | POA: Diagnosis not present

## 2022-10-14 LAB — CBC WITH DIFFERENTIAL (CANCER CENTER ONLY)
Abs Immature Granulocytes: 0.1 10*3/uL — ABNORMAL HIGH (ref 0.00–0.07)
Basophils Absolute: 0 10*3/uL (ref 0.0–0.1)
Basophils Relative: 0 %
Eosinophils Absolute: 0 10*3/uL (ref 0.0–0.5)
Eosinophils Relative: 0 %
HCT: 43.3 % (ref 39.0–52.0)
Hemoglobin: 14.9 g/dL (ref 13.0–17.0)
Immature Granulocytes: 1 %
Lymphocytes Relative: 7 %
Lymphs Abs: 0.5 10*3/uL — ABNORMAL LOW (ref 0.7–4.0)
MCH: 29.3 pg (ref 26.0–34.0)
MCHC: 34.4 g/dL (ref 30.0–36.0)
MCV: 85.2 fL (ref 80.0–100.0)
Monocytes Absolute: 0.8 10*3/uL (ref 0.1–1.0)
Monocytes Relative: 10 %
Neutro Abs: 6.5 10*3/uL (ref 1.7–7.7)
Neutrophils Relative %: 82 %
Platelet Count: 410 10*3/uL — ABNORMAL HIGH (ref 150–400)
RBC: 5.08 MIL/uL (ref 4.22–5.81)
RDW: 13.7 % (ref 11.5–15.5)
WBC Count: 8 10*3/uL (ref 4.0–10.5)
nRBC: 0 % (ref 0.0–0.2)

## 2022-10-14 LAB — CMP (CANCER CENTER ONLY)
ALT: 26 U/L (ref 0–44)
AST: 12 U/L — ABNORMAL LOW (ref 15–41)
Albumin: 4.4 g/dL (ref 3.5–5.0)
Alkaline Phosphatase: 57 U/L (ref 38–126)
Anion gap: 11 (ref 5–15)
BUN: 14 mg/dL (ref 6–20)
CO2: 24 mmol/L (ref 22–32)
Calcium: 9.4 mg/dL (ref 8.9–10.3)
Chloride: 102 mmol/L (ref 98–111)
Creatinine: 1.04 mg/dL (ref 0.61–1.24)
GFR, Estimated: >60 mL/min (ref >60)
Glucose, Bld: 170 mg/dL — ABNORMAL HIGH (ref 70–99)
Potassium: 4 mmol/L (ref 3.5–5.1)
Sodium: 137 mmol/L (ref 135–145)
Total Bilirubin: 0.3 mg/dL (ref 0.3–1.2)
Total Protein: 7.4 g/dL (ref 6.5–8.1)

## 2022-10-14 LAB — RAD ONC ARIA SESSION SUMMARY
Course Elapsed Days: 21
Plan Fractions Treated to Date: 16
Plan Prescribed Dose Per Fraction: 2 Gy
Plan Total Fractions Prescribed: 30
Plan Total Prescribed Dose: 60 Gy
Reference Point Dosage Given to Date: 32 Gy
Reference Point Session Dosage Given: 2 Gy
Session Number: 16

## 2022-10-14 MED ORDER — SODIUM CHLORIDE 0.9% FLUSH
10.0000 mL | INTRAVENOUS | Status: DC | PRN
  Administered 2022-10-14: 10 mL

## 2022-10-14 MED ORDER — HEPARIN SOD (PORK) LOCK FLUSH 100 UNIT/ML IV SOLN
500.0000 [IU] | Freq: Once | INTRAVENOUS | Status: AC | PRN
  Administered 2022-10-14: 500 [IU]

## 2022-10-14 MED ORDER — PALONOSETRON HCL INJECTION 0.25 MG/5ML
0.2500 mg | Freq: Once | INTRAVENOUS | Status: AC
  Administered 2022-10-14: 0.25 mg via INTRAVENOUS
  Filled 2022-10-14: qty 5

## 2022-10-14 MED ORDER — SODIUM CHLORIDE 0.9 % IV SOLN
500.0000 mg/m2 | Freq: Once | INTRAVENOUS | Status: AC
  Administered 2022-10-14: 1000 mg via INTRAVENOUS
  Filled 2022-10-14: qty 40

## 2022-10-14 MED ORDER — SODIUM CHLORIDE 0.9 % IV SOLN: Freq: Once | INTRAVENOUS | Status: AC

## 2022-10-14 MED ORDER — SODIUM CHLORIDE 0.9 % IV SOLN
10.0000 mg | Freq: Once | INTRAVENOUS | Status: AC
  Administered 2022-10-14: 10 mg via INTRAVENOUS
  Filled 2022-10-14: qty 10

## 2022-10-14 MED ORDER — SODIUM CHLORIDE 0.9 % IV SOLN
150.0000 mg | Freq: Once | INTRAVENOUS | Status: AC
  Administered 2022-10-14: 150 mg via INTRAVENOUS
  Filled 2022-10-14: qty 150

## 2022-10-14 MED ORDER — SODIUM CHLORIDE 0.9 % IV SOLN
570.5000 mg | Freq: Once | INTRAVENOUS | Status: AC
  Administered 2022-10-14: 570.5 mg via INTRAVENOUS
  Filled 2022-10-14: qty 57

## 2022-10-14 MED ORDER — TELMISARTAN 40 MG PO TABS: 40.0000 mg | ORAL_TABLET | Freq: Every day | ORAL | 4 refills | Status: DC

## 2022-10-14 NOTE — Progress Notes (Signed)
OK to treat today with BP-136/103 per order of Dr. Marin Olp. Per Dr. Marin Olp pt to take OTC Vitamin B 12 PO daily, not Vitamin B12 injection and Micardis will be sent in for elevated BP to pt.'s pharmacy. Pt and pt.'s wife notified.

## 2022-10-14 NOTE — Patient Instructions (Signed)
[facility]  Discharge Instructions: Thank you for choosing [facility] to provide your oncology and hematology care.   If you have a lab appointment with the [facility], please go directly to the [facility] and check in at the registration area.  Wear comfortable clothing and clothing appropriate for easy access to any Portacath or PICC line.   We strive to give you quality time with your provider. You may need to reschedule your appointment if you arrive late (15 or more minutes).  Arriving late affects you and other patients whose appointments are after yours.  Also, if you miss three or more appointments without notifying the office, you may be dismissed from the clinic at the provider's discretion.      For prescription refill requests, have your pharmacy contact our office and allow 72 hours for refills to be completed.    Today you received the following chemotherapy and/or immunotherapy agents:  Alimta and Carboplatin      To help prevent nausea and vomiting after your treatment, we encourage you to take your nausea medication as directed.  BELOW ARE SYMPTOMS THAT SHOULD BE REPORTED IMMEDIATELY: *FEVER GREATER THAN 100.4 F (38 C) OR HIGHER *CHILLS OR SWEATING *NAUSEA AND VOMITING THAT IS NOT CONTROLLED WITH YOUR NAUSEA MEDICATION *UNUSUAL SHORTNESS OF BREATH *UNUSUAL BRUISING OR BLEEDING *URINARY PROBLEMS (pain or burning when urinating, or frequent urination) *BOWEL PROBLEMS (unusual diarrhea, constipation, pain near the anus) TENDERNESS IN MOUTH AND THROAT WITH OR WITHOUT PRESENCE OF ULCERS (sore throat, sores in mouth, or a toothache) UNUSUAL RASH, SWELLING OR PAIN  UNUSUAL VAGINAL DISCHARGE OR ITCHING   Items with * indicate a potential emergency and should be followed up as soon as possible or go to the Emergency Department if any problems should occur.  Please show the CHEMOTHERAPY ALERT CARD or IMMUNOTHERAPY ALERT  CARD at check-in to the Emergency Department and triage nurse. Should you have questions after your visit or need to cancel or reschedule your appointment, please contact [facility]  [phone] and follow the prompts.  Office hours are 8:00 a.m. to 4:30 p.m. Monday - Friday. Please note that voicemails left after 4:00 p.m. may not be returned until the following business day.  We are closed weekends and major holidays. You have access to a nurse at all times for urgent questions. Please call the main number to the clinic [phone] and follow the prompts.  For any non-urgent questions, you may also contact your provider using MyChart. We now offer e-Visits for anyone 55 and older to request care online for non-urgent symptoms. For details visit [email or web address].   Also download the MyChart app! Go to the app store, search "MyChart", open the app, select [facility], and log in with your MyChart username and password.

## 2022-10-14 NOTE — Progress Notes (Signed)
Hematology and Oncology Follow Up Visit  Brandon Phillips [id number] October 06, 1965 57 y.o. 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  So far, he doing quite well.  He is at 15 radiation treatments.  He is still swallowing well.  There is no dysphagia or odynophagia.  He has had no problems with bowels or bladder.  There is been no chest wall pain.  There has been no fever.  There has been no bleeding.  There has been no rashes.  There has been no problems with leg swelling.  His blood pressure is on the high side.  I will call in some Micardis (40 mg p.o. daily).  He has had no headache.  There has been no bleeding.  He has had no chest wall pain.  Overall, I would have to say that his performance status is ECOG 1.    Medications:  Current Outpatient Medications:    aspirin EC 81 MG tablet, Take 81 mg by mouth every evening. Swallow whole., Disp: , Rfl:    atorvastatin (LIPITOR) 20 MG tablet, TAKE ONE TABLET BY MOUTH DAILY, Disp: 90 tablet, Rfl: 3   budesonide-formoterol (SYMBICORT) 160-4.5 MCG/ACT inhaler, Inhale 2 puffs into the lungs 2 (two) times daily., Disp: 10.2 g, Rfl: 6   COENZYME Q10 PO, Take 100 mg by mouth in the morning., Disp: , Rfl:    dexamethasone (DECADRON) 4 MG tablet, Take 1 tab 2 times daily starting day before pemetrexed. Then take 2 tabs daily x 3 days starting day after carboplatin. Take with food., Disp: 30 tablet, Rfl: 1   folic acid (FOLVITE) 1 MG tablet, Take 1 tablet (1 mg total) by mouth daily. Start 7 days before pemetrexed chemotherapy. Continue until 21 days after pemetrexed completed., Disp: 100 tablet, Rfl: 3   GUAIFENESIN 1200 PO, Take 1,200 mg by mouth in the morning and at bedtime., Disp: , Rfl:    albuterol (VENTOLIN HFA) 108 (90 Base)  MCG/ACT inhaler, Inhale 2 puffs into the lungs every 6 (six) hours as needed for wheezing. (Patient not taking: Reported on 10/14/2022), Disp: 2 each, Rfl: 11   lidocaine-prilocaine (EMLA) cream, Apply a dime size to port-a-cath 1-2 hours prior to access. Cover with saran wrap. (Patient not taking: Reported on 10/14/2022), Disp: 30 g, Rfl: 2   Multiple Vitamin (MULTIVITAMIN WITH MINERALS) TABS tablet, Take 1 tablet by mouth every evening., Disp: , Rfl:    ondansetron (ZOFRAN) 8 MG tablet, Take 1 tablet (8 mg total) by mouth every 8 (eight) hours as needed for nausea or vomiting. Start on the third day after carboplatin. (Patient not taking: Reported on 10/14/2022), Disp: 30 tablet, Rfl: 1   prochlorperazine (COMPAZINE) 10 MG tablet, Take 1 tablet (10 mg total) by mouth every 6 (six) hours as needed for nausea or vomiting. (Patient not taking: Reported on 10/14/2022), Disp: 30 tablet, Rfl: 1  Allergies: No Known Allergies  Past Medical History, Surgical history, Social history, and Family History were reviewed and updated.  Review of Systems: Review of Systems  Constitutional: Negative.   HENT:  Negative.    Eyes: Negative.   Respiratory:  Positive for cough.   Cardiovascular: Negative.   Gastrointestinal: Negative.   Endocrine: Negative.   Genitourinary: Negative.    Musculoskeletal: Negative.  Skin: Negative.   Neurological: Negative.   Hematological: Negative.   Psychiatric/Behavioral: Negative.      Physical Exam:  height is 5\' 5"  (1.651 m) and weight is 166 lb (75.3 kg). His oral temperature is 98.1 F (36.7 C). His blood pressure is 152/101 (abnormal) and his pulse is 94. His respiration is 20 and oxygen saturation is 96%.   Wt Readings from Last 3 Encounters:  10/14/22 166 lb (75.3 kg)  10/03/22 160 lb (72.6 kg)  09/17/22 173 lb (78.5 kg)    Physical Exam Vitals reviewed.  HENT:     Head: Normocephalic and atraumatic.  Eyes:     Pupils: Pupils are equal, round, and  reactive to light.  Cardiovascular:     Rate and Rhythm: Normal rate and regular rhythm.     Heart sounds: Normal heart sounds.  Pulmonary:     Effort: Pulmonary effort is normal.     Breath sounds: Normal breath sounds.  Abdominal:     General: Bowel sounds are normal.     Palpations: Abdomen is soft.  Musculoskeletal:        General: No tenderness or deformity. Normal range of motion.     Cervical back: Normal range of motion.  Lymphadenopathy:     Cervical: No cervical adenopathy.  Skin:    General: Skin is warm and dry.     Findings: No erythema or rash.  Neurological:     Mental Status: He is alert and oriented to person, place, and time.  Psychiatric:        Behavior: Behavior normal.        Thought Content: Thought content normal.        Judgment: Judgment normal.     Lab Results  Component Value Date   WBC 8.0 10/14/2022   HGB 14.9 10/14/2022   HCT 43.3 10/14/2022   MCV 85.2 10/14/2022   PLT 410 (H) 10/14/2022     Chemistry      Component Value Date/Time   NA 135 09/23/2022 0847   K 3.9 09/23/2022 0847   CL 100 09/23/2022 0847   CO2 24 09/23/2022 0847   BUN 10 09/23/2022 0847   CREATININE 1.03 09/23/2022 0847   CREATININE 1.05 01/11/2022 0000      Component Value Date/Time   CALCIUM 9.5 09/23/2022 0847   ALKPHOS 50 09/23/2022 0847   AST 15 09/23/2022 0847   ALT 33 09/23/2022 0847   BILITOT 0.4 09/23/2022 0847      Impression and Plan: Mr. [name] is a very nice 57 year old white male.  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  Again, he does not wish to have surgery.  He has seen Dr. Roxan Hockey of thoracic surgery.  He is getting combined chemotherapy and radiation therapy.  He is doing quite nicely with this so far.  We will go ahead with a second cycle of chemotherapy.  He did have a liquid biopsy done.  We will have to see what the results are.  I will plan to see him back in 3 weeks again.  Once he completes his chemo and  radiation, we will then do another PET scan on him.  Will then see about getting him on maintenance immunotherapy.    Volanda Napoleon, MD 3/18/20249:20 AM

## 2022-10-15 ENCOUNTER — Ambulatory Visit: Payer: BC Managed Care – PPO

## 2022-10-15 ENCOUNTER — Ambulatory Visit
Admission: RE | Admit: 2022-10-15 | Discharge: 2022-10-15 | Disposition: A | Discharge status: Home / Self Care | Payer: BC Managed Care – PPO | Source: Ambulatory Visit | Attending: Radiation Oncology | Admitting: Radiation Oncology

## 2022-10-15 ENCOUNTER — Encounter: Payer: Self-pay | Admitting: *Deleted

## 2022-10-15 ENCOUNTER — Other Ambulatory Visit: Payer: Self-pay

## 2022-10-15 DIAGNOSIS — C3432 Malignant neoplasm of lower lobe, left bronchus or lung: Secondary | ICD-10-CM | POA: Diagnosis not present

## 2022-10-15 DIAGNOSIS — Z87891 Personal history of nicotine dependence: Secondary | ICD-10-CM | POA: Diagnosis not present

## 2022-10-15 DIAGNOSIS — Z510 Encounter for antineoplastic radiation therapy: Secondary | ICD-10-CM | POA: Diagnosis not present

## 2022-10-15 DIAGNOSIS — Z5111 Encounter for antineoplastic chemotherapy: Secondary | ICD-10-CM | POA: Diagnosis not present

## 2022-10-15 LAB — RAD ONC ARIA SESSION SUMMARY
Course Elapsed Days: 22
Plan Fractions Treated to Date: 17
Plan Prescribed Dose Per Fraction: 2 Gy
Plan Total Fractions Prescribed: 30
Plan Total Prescribed Dose: 60 Gy
Reference Point Dosage Given to Date: 34 Gy
Reference Point Session Dosage Given: 2 Gy
Session Number: 17

## 2022-10-15 NOTE — Progress Notes (Signed)
Oncology Nurse Navigator Documentation     10/15/2022    9:15 AM  Oncology Nurse Navigator Flowsheets  Navigator Follow Up Date: 11/04/2022  Navigator Follow Up Reason: Follow-up Appointment;Chemotherapy  Navigator Location [facility]  Navigator Encounter Type Appt/Treatment Plan Review  Patient Visit Type MedOnc  Treatment Phase Active Tx  Barriers/Navigation Needs Coordination of Care;Education  Interventions None Required  Acuity Level 2-Minimal Needs (1-2 Barriers Identified)  Support Groups/Services Friends and Family  Time Spent with Patient 15

## 2022-10-16 ENCOUNTER — Other Ambulatory Visit: Payer: Self-pay

## 2022-10-16 ENCOUNTER — Ambulatory Visit
Admission: RE | Admit: 2022-10-16 | Discharge: 2022-10-16 | Disposition: A | Discharge status: Home / Self Care | Payer: BC Managed Care – PPO | Source: Ambulatory Visit | Attending: Radiation Oncology | Admitting: Radiation Oncology

## 2022-10-16 ENCOUNTER — Ambulatory Visit: Payer: BC Managed Care – PPO

## 2022-10-16 DIAGNOSIS — C3432 Malignant neoplasm of lower lobe, left bronchus or lung: Secondary | ICD-10-CM | POA: Diagnosis not present

## 2022-10-16 DIAGNOSIS — Z510 Encounter for antineoplastic radiation therapy: Secondary | ICD-10-CM | POA: Diagnosis not present

## 2022-10-16 DIAGNOSIS — Z5111 Encounter for antineoplastic chemotherapy: Secondary | ICD-10-CM | POA: Diagnosis not present

## 2022-10-16 DIAGNOSIS — Z87891 Personal history of nicotine dependence: Secondary | ICD-10-CM | POA: Diagnosis not present

## 2022-10-16 LAB — RAD ONC ARIA SESSION SUMMARY
Course Elapsed Days: 23
Plan Fractions Treated to Date: 18
Plan Prescribed Dose Per Fraction: 2 Gy
Plan Total Fractions Prescribed: 30
Plan Total Prescribed Dose: 60 Gy
Reference Point Dosage Given to Date: 36 Gy
Reference Point Session Dosage Given: 2 Gy
Session Number: 18

## 2022-10-17 ENCOUNTER — Ambulatory Visit
Admission: RE | Admit: 2022-10-17 | Discharge: 2022-10-17 | Disposition: A | Discharge status: Home / Self Care | Payer: BC Managed Care – PPO | Source: Ambulatory Visit | Attending: Radiation Oncology | Admitting: Radiation Oncology

## 2022-10-17 ENCOUNTER — Ambulatory Visit: Payer: BC Managed Care – PPO

## 2022-10-17 ENCOUNTER — Other Ambulatory Visit: Payer: Self-pay

## 2022-10-17 DIAGNOSIS — Z87891 Personal history of nicotine dependence: Secondary | ICD-10-CM | POA: Diagnosis not present

## 2022-10-17 DIAGNOSIS — Z5111 Encounter for antineoplastic chemotherapy: Secondary | ICD-10-CM | POA: Diagnosis not present

## 2022-10-17 DIAGNOSIS — C3432 Malignant neoplasm of lower lobe, left bronchus or lung: Secondary | ICD-10-CM | POA: Diagnosis not present

## 2022-10-17 DIAGNOSIS — Z510 Encounter for antineoplastic radiation therapy: Secondary | ICD-10-CM | POA: Diagnosis not present

## 2022-10-17 LAB — RAD ONC ARIA SESSION SUMMARY
Course Elapsed Days: 24
Plan Fractions Treated to Date: 19
Plan Prescribed Dose Per Fraction: 2 Gy
Plan Total Fractions Prescribed: 30
Plan Total Prescribed Dose: 60 Gy
Reference Point Dosage Given to Date: 38 Gy
Reference Point Session Dosage Given: 2 Gy
Session Number: 19

## 2022-10-18 ENCOUNTER — Ambulatory Visit
Admission: RE | Admit: 2022-10-18 | Discharge: 2022-10-18 | Disposition: A | Discharge status: Home / Self Care | Payer: BC Managed Care – PPO | Source: Ambulatory Visit | Attending: Radiation Oncology | Admitting: Radiation Oncology

## 2022-10-18 ENCOUNTER — Other Ambulatory Visit: Payer: Self-pay | Admitting: Radiation Oncology

## 2022-10-18 ENCOUNTER — Ambulatory Visit: Payer: BC Managed Care – PPO

## 2022-10-18 ENCOUNTER — Other Ambulatory Visit: Payer: Self-pay

## 2022-10-18 DIAGNOSIS — C3432 Malignant neoplasm of lower lobe, left bronchus or lung: Secondary | ICD-10-CM | POA: Diagnosis not present

## 2022-10-18 DIAGNOSIS — Z87891 Personal history of nicotine dependence: Secondary | ICD-10-CM | POA: Diagnosis not present

## 2022-10-18 DIAGNOSIS — Z5111 Encounter for antineoplastic chemotherapy: Secondary | ICD-10-CM | POA: Diagnosis not present

## 2022-10-18 DIAGNOSIS — Z510 Encounter for antineoplastic radiation therapy: Secondary | ICD-10-CM | POA: Diagnosis not present

## 2022-10-18 LAB — RAD ONC ARIA SESSION SUMMARY
Course Elapsed Days: 25
Plan Fractions Treated to Date: 20
Plan Prescribed Dose Per Fraction: 2 Gy
Plan Total Fractions Prescribed: 30
Plan Total Prescribed Dose: 60 Gy
Reference Point Dosage Given to Date: 40 Gy
Reference Point Session Dosage Given: 2 Gy
Session Number: 20

## 2022-10-18 MED ORDER — SUCRALFATE 1 G PO TABS: 1.0000 g | ORAL_TABLET | Freq: Four times a day (QID) | ORAL | 2 refills | Status: DC

## 2022-10-21 ENCOUNTER — Ambulatory Visit (HOSPITAL_COMMUNITY)
Admission: RE | Admit: 2022-10-21 | Discharge: 2022-10-21 | Disposition: A | Discharge status: Home / Self Care | Payer: BC Managed Care – PPO | Source: Ambulatory Visit | Attending: Radiology | Admitting: Radiology

## 2022-10-21 ENCOUNTER — Ambulatory Visit
Admission: RE | Admit: 2022-10-21 | Discharge: 2022-10-21 | Disposition: A | Discharge status: Home / Self Care | Payer: BC Managed Care – PPO | Source: Ambulatory Visit | Attending: Radiation Oncology | Admitting: Radiation Oncology

## 2022-10-21 ENCOUNTER — Ambulatory Visit: Payer: BC Managed Care – PPO

## 2022-10-21 ENCOUNTER — Other Ambulatory Visit: Payer: Self-pay | Admitting: Radiology

## 2022-10-21 ENCOUNTER — Other Ambulatory Visit: Payer: Self-pay

## 2022-10-21 DIAGNOSIS — Z452 Encounter for adjustment and management of vascular access device: Secondary | ICD-10-CM

## 2022-10-21 DIAGNOSIS — C3432 Malignant neoplasm of lower lobe, left bronchus or lung: Secondary | ICD-10-CM | POA: Diagnosis not present

## 2022-10-21 DIAGNOSIS — Z5111 Encounter for antineoplastic chemotherapy: Secondary | ICD-10-CM | POA: Diagnosis not present

## 2022-10-21 DIAGNOSIS — Z87891 Personal history of nicotine dependence: Secondary | ICD-10-CM | POA: Diagnosis not present

## 2022-10-21 DIAGNOSIS — C3490 Malignant neoplasm of unspecified part of unspecified bronchus or lung: Secondary | ICD-10-CM | POA: Diagnosis not present

## 2022-10-21 DIAGNOSIS — Z510 Encounter for antineoplastic radiation therapy: Secondary | ICD-10-CM | POA: Diagnosis not present

## 2022-10-21 HISTORY — PX: IR RADIOLOGIST EVAL & MGMT: IMG5224

## 2022-10-21 LAB — RAD ONC ARIA SESSION SUMMARY
Course Elapsed Days: 28
Plan Fractions Treated to Date: 21
Plan Prescribed Dose Per Fraction: 2 Gy
Plan Total Fractions Prescribed: 30
Plan Total Prescribed Dose: 60 Gy
Reference Point Dosage Given to Date: 42 Gy
Reference Point Session Dosage Given: 2 Gy
Session Number: 21

## 2022-10-21 NOTE — Progress Notes (Signed)
Patient with a history of left lung adenocarcinoma s/p port-a-catheter placement in IR 10/03/22 by Dr. Annamaria Boots. Patient called IR today with complaints of pain, erythema and pus at the supraclavicular region above the actual port site. Patient scheduled for same day appointment for evaluation.   On exam, the port site itself is unremarkable. There is a dime-sized area in the supraclavicular region that is tender to palpation, erythematous and with a small amount of pus. Please see picture under the Media tab in Epic. With moderate palpation more pus was expressed from the site. Patient denies any other symptoms.  Site was cleaned with chloraprep and covered with gauze and tegaderm. A prescription for Bactrim DS (BID x 5 days) will be e-prescribed to his pharmacy. The patient was advised to gently wash the area twice daily and apply neosporin (or similar ointment) and cover site with a bandaid.   The patient will be scheduled for a follow up visit in one week. The patient was advised that if the site is healing/has healed he can cancel the appointment at his discretion.  Soyla Dryer, [place] [phone] 10/21/2022, 3:25 PM

## 2022-10-22 ENCOUNTER — Telehealth (HOSPITAL_COMMUNITY): Payer: Self-pay | Admitting: Student

## 2022-10-22 ENCOUNTER — Other Ambulatory Visit: Payer: Self-pay

## 2022-10-22 ENCOUNTER — Ambulatory Visit
Admission: RE | Admit: 2022-10-22 | Discharge: 2022-10-22 | Disposition: A | Discharge status: Home / Self Care | Payer: BC Managed Care – PPO | Source: Ambulatory Visit | Attending: Radiation Oncology | Admitting: Radiation Oncology

## 2022-10-22 ENCOUNTER — Ambulatory Visit: Payer: BC Managed Care – PPO

## 2022-10-22 DIAGNOSIS — Z87891 Personal history of nicotine dependence: Secondary | ICD-10-CM | POA: Diagnosis not present

## 2022-10-22 DIAGNOSIS — C3432 Malignant neoplasm of lower lobe, left bronchus or lung: Secondary | ICD-10-CM | POA: Diagnosis not present

## 2022-10-22 DIAGNOSIS — Z5111 Encounter for antineoplastic chemotherapy: Secondary | ICD-10-CM | POA: Diagnosis not present

## 2022-10-22 DIAGNOSIS — Z510 Encounter for antineoplastic radiation therapy: Secondary | ICD-10-CM | POA: Diagnosis not present

## 2022-10-22 LAB — RAD ONC ARIA SESSION SUMMARY
Course Elapsed Days: 29
Plan Fractions Treated to Date: 22
Plan Prescribed Dose Per Fraction: 2 Gy
Plan Total Fractions Prescribed: 30
Plan Total Prescribed Dose: 60 Gy
Reference Point Dosage Given to Date: 44 Gy
Reference Point Session Dosage Given: 2 Gy
Session Number: 22

## 2022-10-22 MED ORDER — SULFAMETHOXAZOLE-TRIMETHOPRIM 800-160 MG PO TABS: 1.0000 | ORAL_TABLET | Freq: Two times a day (BID) | ORAL | 0 refills | Status: AC

## 2022-10-22 NOTE — Telephone Encounter (Signed)
Prescription for Bactrim sent to [facility] pharmacy in [place]. Wife aware. Bactrim DS 1 tablet twice daily x 5 days.  Soyla Dryer, [place] [phone] 10/22/2022, 9:41 AM

## 2022-10-23 ENCOUNTER — Ambulatory Visit
Admission: RE | Admit: 2022-10-23 | Discharge: 2022-10-23 | Disposition: A | Discharge status: Home / Self Care | Payer: BC Managed Care – PPO | Source: Ambulatory Visit | Attending: Radiation Oncology | Admitting: Radiation Oncology

## 2022-10-23 ENCOUNTER — Ambulatory Visit: Payer: BC Managed Care – PPO

## 2022-10-23 ENCOUNTER — Other Ambulatory Visit: Payer: Self-pay

## 2022-10-23 DIAGNOSIS — C3432 Malignant neoplasm of lower lobe, left bronchus or lung: Secondary | ICD-10-CM | POA: Diagnosis not present

## 2022-10-23 DIAGNOSIS — Z510 Encounter for antineoplastic radiation therapy: Secondary | ICD-10-CM | POA: Diagnosis not present

## 2022-10-23 DIAGNOSIS — Z5111 Encounter for antineoplastic chemotherapy: Secondary | ICD-10-CM | POA: Diagnosis not present

## 2022-10-23 DIAGNOSIS — Z87891 Personal history of nicotine dependence: Secondary | ICD-10-CM | POA: Diagnosis not present

## 2022-10-23 LAB — RAD ONC ARIA SESSION SUMMARY
Course Elapsed Days: 30
Plan Fractions Treated to Date: 23
Plan Prescribed Dose Per Fraction: 2 Gy
Plan Total Fractions Prescribed: 30
Plan Total Prescribed Dose: 60 Gy
Reference Point Dosage Given to Date: 46 Gy
Reference Point Session Dosage Given: 2 Gy
Session Number: 23

## 2022-10-24 ENCOUNTER — Ambulatory Visit: Payer: BC Managed Care – PPO

## 2022-10-24 ENCOUNTER — Ambulatory Visit
Admission: RE | Admit: 2022-10-24 | Discharge: 2022-10-24 | Disposition: A | Discharge status: Home / Self Care | Payer: BC Managed Care – PPO | Source: Ambulatory Visit | Attending: Radiation Oncology | Admitting: Radiation Oncology

## 2022-10-24 ENCOUNTER — Other Ambulatory Visit: Payer: Self-pay

## 2022-10-24 DIAGNOSIS — Z510 Encounter for antineoplastic radiation therapy: Secondary | ICD-10-CM | POA: Diagnosis not present

## 2022-10-24 DIAGNOSIS — C3432 Malignant neoplasm of lower lobe, left bronchus or lung: Secondary | ICD-10-CM | POA: Diagnosis not present

## 2022-10-24 DIAGNOSIS — Z87891 Personal history of nicotine dependence: Secondary | ICD-10-CM | POA: Diagnosis not present

## 2022-10-24 DIAGNOSIS — Z5111 Encounter for antineoplastic chemotherapy: Secondary | ICD-10-CM | POA: Diagnosis not present

## 2022-10-24 LAB — RAD ONC ARIA SESSION SUMMARY
Course Elapsed Days: 31
Plan Fractions Treated to Date: 24
Plan Prescribed Dose Per Fraction: 2 Gy
Plan Total Fractions Prescribed: 30
Plan Total Prescribed Dose: 60 Gy
Reference Point Dosage Given to Date: 48 Gy
Reference Point Session Dosage Given: 2 Gy
Session Number: 24

## 2022-10-25 ENCOUNTER — Other Ambulatory Visit: Payer: Self-pay | Admitting: Radiation Oncology

## 2022-10-25 ENCOUNTER — Ambulatory Visit
Admission: RE | Admit: 2022-10-25 | Discharge: 2022-10-25 | Disposition: A | Discharge status: Home / Self Care | Payer: BC Managed Care – PPO | Source: Ambulatory Visit | Attending: Radiation Oncology | Admitting: Radiation Oncology

## 2022-10-25 ENCOUNTER — Other Ambulatory Visit: Payer: Self-pay

## 2022-10-25 ENCOUNTER — Ambulatory Visit: Payer: BC Managed Care – PPO

## 2022-10-25 DIAGNOSIS — C3432 Malignant neoplasm of lower lobe, left bronchus or lung: Secondary | ICD-10-CM | POA: Diagnosis not present

## 2022-10-25 DIAGNOSIS — Z5111 Encounter for antineoplastic chemotherapy: Secondary | ICD-10-CM | POA: Diagnosis not present

## 2022-10-25 DIAGNOSIS — Z510 Encounter for antineoplastic radiation therapy: Secondary | ICD-10-CM | POA: Diagnosis not present

## 2022-10-25 DIAGNOSIS — Z87891 Personal history of nicotine dependence: Secondary | ICD-10-CM | POA: Diagnosis not present

## 2022-10-25 LAB — RAD ONC ARIA SESSION SUMMARY
Course Elapsed Days: 32
Plan Fractions Treated to Date: 25
Plan Prescribed Dose Per Fraction: 2 Gy
Plan Total Fractions Prescribed: 30
Plan Total Prescribed Dose: 60 Gy
Reference Point Dosage Given to Date: 50 Gy
Reference Point Session Dosage Given: 2 Gy
Session Number: 25

## 2022-10-25 MED ORDER — HYDROCODONE-ACETAMINOPHEN 7.5-325 MG/15ML PO SOLN: 10.0000 mL | Freq: Four times a day (QID) | ORAL | 0 refills | Status: DC | PRN

## 2022-10-27 NOTE — Progress Notes (Unsigned)
Synopsis: Referred for LLL mass by Samuel Bouche, NP  Subjective:   PATIENT ID: Brandon Phillips GENDER: male DOB: [birth year], MRN: [id number]  No chief complaint on file.  57yM with history of 13 y 2.5ppd smoking referred for LLL mass, pet avid hilar lad, RUL 95mm nodule low level metabolic activity  He says that he has COPD and did screening CT Chest and then PET/CT. He does have chronic cough. No hemoptysis. He has been on symbicort for about a year but doesn't notice much effect from it. He hasn't needed prednisone ever for trouble breathing.     Adopted, unsure of family history  Works in [employer] (medical). Some vaping with nicotine. No longer smoking cigarettes. Working his way down with vaping.   Interval HPI: Undergoing radiation for stage IIb NSCLC LLL, neoadjuvant chemoimmuunotherapy with subsequent plan for restaging, resection potentially offered though he is hopeful to avoid surgery.      Otherwise pertinent review of systems is negative.  Past Medical History:  Diagnosis Date   Cancer ([place])    lung cancer   CAP (community acquired pneumonia)    COPD (chronic obstructive pulmonary disease) ([place])    Eczema    HLD (hyperlipidemia)    Primary cancer of left lower lobe of lung ([place]) 09/17/2022   Tobacco use disorder 01/24/2017     No family history on file.   Past Surgical History:  Procedure Laterality Date   BRONCHIAL BIOPSY  08/29/2022   Procedure: BRONCHIAL BIOPSIES;  Surgeon: [name], MD;  Location: [facility];  Service: Pulmonary;;   BRONCHIAL BRUSHINGS  08/29/2022   Procedure: BRONCHIAL BRUSHINGS;  Surgeon: [name], MD;  Location: [facility];  Service: Pulmonary;;   BRONCHIAL NEEDLE ASPIRATION BIOPSY  08/29/2022   Procedure: BRONCHIAL NEEDLE ASPIRATION BIOPSIES;  Surgeon: [name], MD;  Location: [facility];  Service: Pulmonary;;   BRONCHIAL WASHINGS  08/29/2022   Procedure: BRONCHIAL WASHINGS;  Surgeon: [name], MD;  Location[facility] ENDOSCOPY;  Service: Pulmonary;;   FIDUCIAL MARKER PLACEMENT  08/29/2022   Procedure: FIDUCIAL MARKER PLACEMENT;  Surgeon: [name], MD;  Location: [facility] ENDOSCOPY;  Service: Pulmonary;;   HERNIA REPAIR     as an infant   IR IMAGING GUIDED PORT INSERTION  10/03/2022   IR RADIOLOGIST EVAL & MGMT  10/21/2022   VIDEO BRONCHOSCOPY WITH ENDOBRONCHIAL ULTRASOUND  08/29/2022   Procedure: VIDEO BRONCHOSCOPY WITH ENDOBRONCHIAL ULTRASOUND;  Surgeon: [name], MD;  Location[facility] ENDOSCOPY;  Service: Pulmonary;;    Social History   Socioeconomic History   Marital status: Married    Spouse name: Not on file   Number of children: Not on file   Years of education: Not on file   Highest education level: Not on file  Occupational History   Not on file  Tobacco Use   Smoking status: Former    Packs/day: 1.50    Years: 35.00    Additional pack years: 0.00    Total pack years: 52.50    Types: Cigarettes    Quit date: 03/30/2017    Years since quitting: 5.5   Smokeless tobacco: Never  Vaping Use   Vaping Use: Former   Quit date: 08/23/2022   Substances: Nicotine  Substance and Sexual Activity   Alcohol use: No   Drug use: No   Sexual activity: Yes  Other Topics Concern   Not on file  Social History Narrative   Not on file   Social Determinants of  Health   Financial Resource Strain: Not on file  Food Insecurity: No Food Insecurity (09/05/2022)   Hunger Vital Sign    Worried About Running Out of Food in the Last Year: Never true    Ran Out of Food in the Last Year: Never true  Transportation Needs: No Transportation Needs (09/05/2022)   PRAPARE - Hydrologist (Medical): No    Lack of Transportation (Non-Medical): No  Physical Activity: Not on file  Stress: Not on file  Social Connections: Not on file  Intimate Partner Violence: Not At Risk (09/05/2022)   Humiliation, Afraid, Rape, and Kick questionnaire    Fear of Current  or Ex-Partner: No    Emotionally Abused: No    Physically Abused: No    Sexually Abused: No     No Known Allergies   Outpatient Medications Prior to Visit  Medication Sig Dispense Refill   HYDROcodone-acetaminophen (HYCET) 7.5-325 mg/15 ml solution Take 10-15 mLs by mouth every 6 (six) hours as needed for moderate pain. 473 mL 0   sucralfate (CARAFATE) 1 g tablet Take 1 tablet (1 g total) by mouth 4 (four) times daily. Dissolve each tablet in 15 cc water before use. 120 tablet 2   albuterol (VENTOLIN HFA) 108 (90 Base) MCG/ACT inhaler Inhale 2 puffs into the lungs every 6 (six) hours as needed for wheezing. (Patient not taking: Reported on 10/14/2022) 2 each 11   aspirin EC 81 MG tablet Take 81 mg by mouth every evening. Swallow whole.     atorvastatin (LIPITOR) 20 MG tablet TAKE ONE TABLET BY MOUTH DAILY 90 tablet 3   budesonide-formoterol (SYMBICORT) 160-4.5 MCG/ACT inhaler Inhale 2 puffs into the lungs 2 (two) times daily. 10.2 g 6   COENZYME Q10 PO Take 100 mg by mouth in the morning.     dexamethasone (DECADRON) 4 MG tablet Take 1 tab 2 times daily starting day before pemetrexed. Then take 2 tabs daily x 3 days starting day after carboplatin. Take with food. 30 tablet 1   folic acid (FOLVITE) 1 MG tablet Take 1 tablet (1 mg total) by mouth daily. Start 7 days before pemetrexed chemotherapy. Continue until 21 days after pemetrexed completed. 100 tablet 3   GUAIFENESIN 1200 PO Take 1,200 mg by mouth in the morning and at bedtime.     lidocaine-prilocaine (EMLA) cream Apply a dime size to port-a-cath 1-2 hours prior to access. Cover with saran wrap. (Patient not taking: Reported on 10/14/2022) 30 g 2   Multiple Vitamin (MULTIVITAMIN WITH MINERALS) TABS tablet Take 1 tablet by mouth every evening.     ondansetron (ZOFRAN) 8 MG tablet Take 1 tablet (8 mg total) by mouth every 8 (eight) hours as needed for nausea or vomiting. Start on the third day after carboplatin. (Patient not taking: Reported  on 10/14/2022) 30 tablet 1   prochlorperazine (COMPAZINE) 10 MG tablet Take 1 tablet (10 mg total) by mouth every 6 (six) hours as needed for nausea or vomiting. (Patient not taking: Reported on 10/14/2022) 30 tablet 1   sulfamethoxazole-trimethoprim (BACTRIM DS) 800-160 MG tablet Take 1 tablet by mouth 2 (two) times daily for 5 days. 10 tablet 0   telmisartan (MICARDIS) 40 MG tablet Take 1 tablet (40 mg total) by mouth daily. 30 tablet 4   No facility-administered medications prior to visit.       Objective:   Physical Exam:  General appearance: 57 y.o., male, NAD, conversant  Eyes: anicteric sclerae; PERRL, tracking appropriately  HENT: NCAT; MMM Neck: Trachea midline; no lymphadenopathy, no JVD Lungs: CTAB, no crackles, no wheeze, with normal respiratory effort CV: RRR, no murmur  Abdomen: Soft, non-tender; non-distended, BS present  Extremities: No peripheral edema, warm Skin: Normal turgor and texture; no rash Psych: Appropriate affect Neuro: Alert and oriented to person and place, no focal deficit     There were no vitals filed for this visit.    on RA BMI Readings from Last 3 Encounters:  10/14/22 27.62 kg/m  10/03/22 26.63 kg/m  09/17/22 28.79 kg/m   Wt Readings from Last 3 Encounters:  10/14/22 166 lb (75.3 kg)  10/03/22 160 lb (72.6 kg)  09/17/22 173 lb (78.5 kg)     CBC    Component Value Date/Time   WBC 8.0 10/14/2022 0852   WBC 9.4 01/11/2022 0000   RBC 5.08 10/14/2022 0852   HGB 14.9 10/14/2022 0852   HCT 43.3 10/14/2022 0852   PLT 410 (H) 10/14/2022 0852   MCV 85.2 10/14/2022 0852   MCH 29.3 10/14/2022 0852   MCHC 34.4 10/14/2022 0852   RDW 13.7 10/14/2022 0852   LYMPHSABS 0.5 (L) 10/14/2022 0852   MONOABS 0.8 10/14/2022 0852   EOSABS 0.0 10/14/2022 0852   BASOSABS 0.0 10/14/2022 0852     Chest Imaging: PET/CT 08/14/22 with hypermetabolic LLL mass, low level hypermetabolism SUV 1.6 RUL 24mm nodule, posterior left hilar  hypermetabolism  Pulmonary Functions Testing Results:    Latest Ref Rng & Units 08/26/2022    3:25 PM  PFT Results  FVC-Pre L 2.75   FVC-Predicted Pre % 66   FVC-Post L 2.77   FVC-Predicted Post % 67   Pre FEV1/FVC % % 48   Post FEV1/FCV % % 48   FEV1-Pre L 1.33   FEV1-Predicted Pre % 42   FEV1-Post L 1.33   DLCO uncorrected ml/min/mmHg 15.07   DLCO UNC% % 61   DLCO corrected ml/min/mmHg 14.04   DLCO COR %Predicted % 57   DLVA Predicted % 60   TLC L 7.30   TLC % Predicted % 122   RV % Predicted % 235    PFT 08/26/22 with severe obstruction, hyperinflation, air trapping, moderately reduced diffusing capacity  TTE 09/10/22:  1. Mild intracavitary gradient. Peak velocity 0.96 m/s. Peak gradient 3.7  mmHg. Left ventricular ejection fraction, by estimation, is 65 to 70%. The  left ventricle has normal function. The left ventricle has no regional  wall motion abnormalities. Left  ventricular diastolic parameters were normal. The average left ventricular  global longitudinal strain is -19.4 %. The global longitudinal strain is  normal.   2. Right ventricular systolic function is normal. The right ventricular  size is normal. There is normal pulmonary artery systolic pressure.   3. The mitral valve is normal in structure. No evidence of mitral valve  regurgitation. No evidence of mitral stenosis.   4. The aortic valve is tricuspid. Aortic valve regurgitation is not  visualized. No aortic stenosis is present.   5. The inferior vena cava is normal in size with greater than 50%  respiratory variability, suggesting right atrial pressure of 3 mmHg.     Assessment & Plan:   # LLL mass # Left hilar pet avid LN # RUL weakly avid 5mm nodule  # History of smoking  # Reported history of COPD  Plan: - Symbicort 2 puffs twice daily rinse mouth after use - we will try to schedule navigation bronchoscopy and EBUS for 2/1.His most recent low  dose CT Chest 1/2 appears to have sufficient  resolution for navigation but will need to confirm we can have this pushed to our PACS.  - you will be called to schedule PFTs (pulmonary function tests) at [facility] - tentative follow up in 4 weeks but we may need to move this date aroudn pending results     [name], MD [facility] 10/27/2022 4:04 PM

## 2022-10-28 ENCOUNTER — Ambulatory Visit
Admission: RE | Admit: 2022-10-28 | Discharge: 2022-10-28 | Disposition: A | Discharge status: Home / Self Care | Payer: BC Managed Care – PPO | Source: Ambulatory Visit | Attending: Radiation Oncology | Admitting: Radiation Oncology

## 2022-10-28 ENCOUNTER — Other Ambulatory Visit: Payer: Self-pay

## 2022-10-28 ENCOUNTER — Ambulatory Visit: Payer: BC Managed Care – PPO

## 2022-10-28 ENCOUNTER — Encounter: Payer: Self-pay | Admitting: Hematology & Oncology

## 2022-10-28 ENCOUNTER — Inpatient Hospital Stay (HOSPITAL_COMMUNITY): Admission: RE | Admit: 2022-10-28 | Payer: BC Managed Care – PPO | Source: Ambulatory Visit

## 2022-10-28 DIAGNOSIS — Z5111 Encounter for antineoplastic chemotherapy: Secondary | ICD-10-CM | POA: Insufficient documentation

## 2022-10-28 DIAGNOSIS — Z510 Encounter for antineoplastic radiation therapy: Secondary | ICD-10-CM | POA: Insufficient documentation

## 2022-10-28 DIAGNOSIS — Z87891 Personal history of nicotine dependence: Secondary | ICD-10-CM | POA: Insufficient documentation

## 2022-10-28 DIAGNOSIS — C3432 Malignant neoplasm of lower lobe, left bronchus or lung: Secondary | ICD-10-CM | POA: Diagnosis not present

## 2022-10-28 LAB — RAD ONC ARIA SESSION SUMMARY
Course Elapsed Days: 35
Plan Fractions Treated to Date: 26
Plan Prescribed Dose Per Fraction: 2 Gy
Plan Total Fractions Prescribed: 30
Plan Total Prescribed Dose: 60 Gy
Reference Point Dosage Given to Date: 52 Gy
Reference Point Session Dosage Given: 2 Gy
Session Number: 26

## 2022-10-29 ENCOUNTER — Ambulatory Visit (INDEPENDENT_AMBULATORY_CARE_PROVIDER_SITE_OTHER): Payer: BC Managed Care – PPO | Admitting: Student

## 2022-10-29 ENCOUNTER — Other Ambulatory Visit: Payer: Self-pay

## 2022-10-29 ENCOUNTER — Encounter: Payer: Self-pay | Admitting: Student

## 2022-10-29 ENCOUNTER — Ambulatory Visit: Payer: BC Managed Care – PPO

## 2022-10-29 ENCOUNTER — Ambulatory Visit
Admission: RE | Admit: 2022-10-29 | Discharge: 2022-10-29 | Disposition: A | Discharge status: Home / Self Care | Payer: BC Managed Care – PPO | Source: Ambulatory Visit | Attending: Radiation Oncology | Admitting: Radiation Oncology

## 2022-10-29 VITALS — BP 104/64 | HR 104 | Temp 98.3°F | Ht 65.0 in | Wt 161.2 lb

## 2022-10-29 DIAGNOSIS — C3432 Malignant neoplasm of lower lobe, left bronchus or lung: Secondary | ICD-10-CM | POA: Diagnosis not present

## 2022-10-29 DIAGNOSIS — Z87891 Personal history of nicotine dependence: Secondary | ICD-10-CM | POA: Diagnosis not present

## 2022-10-29 DIAGNOSIS — J449 Chronic obstructive pulmonary disease, unspecified: Secondary | ICD-10-CM | POA: Diagnosis not present

## 2022-10-29 DIAGNOSIS — Z5111 Encounter for antineoplastic chemotherapy: Secondary | ICD-10-CM | POA: Diagnosis not present

## 2022-10-29 DIAGNOSIS — C3492 Malignant neoplasm of unspecified part of left bronchus or lung: Secondary | ICD-10-CM | POA: Diagnosis not present

## 2022-10-29 DIAGNOSIS — Z510 Encounter for antineoplastic radiation therapy: Secondary | ICD-10-CM | POA: Diagnosis not present

## 2022-10-29 LAB — RAD ONC ARIA SESSION SUMMARY
Course Elapsed Days: 36
Plan Fractions Treated to Date: 27
Plan Prescribed Dose Per Fraction: 2 Gy
Plan Total Fractions Prescribed: 30
Plan Total Prescribed Dose: 60 Gy
Reference Point Dosage Given to Date: 54 Gy
Reference Point Session Dosage Given: 2 Gy
Session Number: 27

## 2022-10-29 MED ORDER — BREZTRI AEROSPHERE 160-9-4.8 MCG/ACT IN AERO: 2.0000 | INHALATION_SPRAY | Freq: Two times a day (BID) | RESPIRATORY_TRACT | 0 refills | Status: DC

## 2022-10-29 MED ORDER — AZITHROMYCIN 250 MG PO TABS: ORAL_TABLET | ORAL | 0 refills | Status: DC

## 2022-10-29 MED ORDER — PREDNISONE 10 MG PO TABS: ORAL_TABLET | ORAL | 0 refills | Status: DC

## 2022-10-29 MED ORDER — BREZTRI AEROSPHERE 160-9-4.8 MCG/ACT IN AERO: 2.0000 | INHALATION_SPRAY | Freq: Two times a day (BID) | RESPIRATORY_TRACT | 11 refills | Status: DC

## 2022-10-29 NOTE — Patient Instructions (Addendum)
-   try breztri 2 puffs twice daily, rinse mouth and brush teeth/tongue after  - albuterol as needed - stop symbicort - prednisone taper and azithromycin if your cough is failing to clear up with inhaler change above - see you in 3 months or sooner if need be!

## 2022-10-30 ENCOUNTER — Other Ambulatory Visit: Payer: Self-pay

## 2022-10-30 ENCOUNTER — Ambulatory Visit
Admission: RE | Admit: 2022-10-30 | Discharge: 2022-10-30 | Disposition: A | Discharge status: Home / Self Care | Payer: BC Managed Care – PPO | Source: Ambulatory Visit | Attending: Radiation Oncology | Admitting: Radiation Oncology

## 2022-10-30 ENCOUNTER — Ambulatory Visit: Payer: BC Managed Care – PPO

## 2022-10-30 DIAGNOSIS — Z5111 Encounter for antineoplastic chemotherapy: Secondary | ICD-10-CM | POA: Diagnosis not present

## 2022-10-30 DIAGNOSIS — Z510 Encounter for antineoplastic radiation therapy: Secondary | ICD-10-CM | POA: Diagnosis not present

## 2022-10-30 DIAGNOSIS — Z87891 Personal history of nicotine dependence: Secondary | ICD-10-CM | POA: Diagnosis not present

## 2022-10-30 DIAGNOSIS — C3432 Malignant neoplasm of lower lobe, left bronchus or lung: Secondary | ICD-10-CM | POA: Diagnosis not present

## 2022-10-30 LAB — RAD ONC ARIA SESSION SUMMARY
Course Elapsed Days: 37
Plan Fractions Treated to Date: 28
Plan Prescribed Dose Per Fraction: 2 Gy
Plan Total Fractions Prescribed: 30
Plan Total Prescribed Dose: 60 Gy
Reference Point Dosage Given to Date: 56 Gy
Reference Point Session Dosage Given: 2 Gy
Session Number: 28

## 2022-10-31 ENCOUNTER — Ambulatory Visit: Payer: BC Managed Care – PPO

## 2022-10-31 ENCOUNTER — Ambulatory Visit
Admission: RE | Admit: 2022-10-31 | Discharge: 2022-10-31 | Disposition: A | Discharge status: Home / Self Care | Payer: BC Managed Care – PPO | Source: Ambulatory Visit | Attending: Radiation Oncology | Admitting: Radiation Oncology

## 2022-10-31 ENCOUNTER — Other Ambulatory Visit: Payer: Self-pay

## 2022-10-31 DIAGNOSIS — C3432 Malignant neoplasm of lower lobe, left bronchus or lung: Secondary | ICD-10-CM | POA: Diagnosis not present

## 2022-10-31 DIAGNOSIS — Z87891 Personal history of nicotine dependence: Secondary | ICD-10-CM | POA: Diagnosis not present

## 2022-10-31 DIAGNOSIS — Z5111 Encounter for antineoplastic chemotherapy: Secondary | ICD-10-CM | POA: Diagnosis not present

## 2022-10-31 DIAGNOSIS — Z510 Encounter for antineoplastic radiation therapy: Secondary | ICD-10-CM | POA: Diagnosis not present

## 2022-10-31 LAB — RAD ONC ARIA SESSION SUMMARY
Course Elapsed Days: 38
Plan Fractions Treated to Date: 29
Plan Prescribed Dose Per Fraction: 2 Gy
Plan Total Fractions Prescribed: 30
Plan Total Prescribed Dose: 60 Gy
Reference Point Dosage Given to Date: 58 Gy
Reference Point Session Dosage Given: 2 Gy
Session Number: 29

## 2022-11-01 ENCOUNTER — Ambulatory Visit
Admission: RE | Admit: 2022-11-01 | Discharge: 2022-11-01 | Disposition: A | Discharge status: Home / Self Care | Payer: BC Managed Care – PPO | Source: Ambulatory Visit | Attending: Radiation Oncology | Admitting: Radiation Oncology

## 2022-11-01 ENCOUNTER — Ambulatory Visit: Payer: BC Managed Care – PPO

## 2022-11-01 ENCOUNTER — Other Ambulatory Visit: Payer: Self-pay

## 2022-11-01 ENCOUNTER — Other Ambulatory Visit: Payer: Self-pay | Admitting: Radiology

## 2022-11-01 DIAGNOSIS — C3432 Malignant neoplasm of lower lobe, left bronchus or lung: Secondary | ICD-10-CM | POA: Diagnosis not present

## 2022-11-01 DIAGNOSIS — Z87891 Personal history of nicotine dependence: Secondary | ICD-10-CM | POA: Diagnosis not present

## 2022-11-01 DIAGNOSIS — Z510 Encounter for antineoplastic radiation therapy: Secondary | ICD-10-CM | POA: Diagnosis not present

## 2022-11-01 DIAGNOSIS — Z5111 Encounter for antineoplastic chemotherapy: Secondary | ICD-10-CM | POA: Diagnosis not present

## 2022-11-01 LAB — RAD ONC ARIA SESSION SUMMARY
Course Elapsed Days: 39
Plan Fractions Treated to Date: 30
Plan Prescribed Dose Per Fraction: 2 Gy
Plan Total Fractions Prescribed: 30
Plan Total Prescribed Dose: 60 Gy
Reference Point Dosage Given to Date: 60 Gy
Reference Point Session Dosage Given: 2 Gy
Session Number: 30

## 2022-11-01 MED ORDER — HYDROCODONE-ACETAMINOPHEN 7.5-325 MG/15ML PO SOLN: 10.0000 mL | Freq: Four times a day (QID) | ORAL | 0 refills | Status: DC | PRN

## 2022-11-04 ENCOUNTER — Inpatient Hospital Stay (HOSPITAL_BASED_OUTPATIENT_CLINIC_OR_DEPARTMENT_OTHER): Payer: BC Managed Care – PPO | Admitting: Hematology & Oncology

## 2022-11-04 ENCOUNTER — Encounter: Payer: Self-pay | Admitting: *Deleted

## 2022-11-04 ENCOUNTER — Inpatient Hospital Stay: Payer: BC Managed Care – PPO

## 2022-11-04 ENCOUNTER — Ambulatory Visit: Payer: BC Managed Care – PPO

## 2022-11-04 ENCOUNTER — Other Ambulatory Visit: Payer: Self-pay

## 2022-11-04 ENCOUNTER — Encounter: Payer: Self-pay | Admitting: Hematology & Oncology

## 2022-11-04 ENCOUNTER — Ambulatory Visit
Admission: RE | Admit: 2022-11-04 | Discharge: 2022-11-04 | Disposition: A | Discharge status: Home / Self Care | Payer: BC Managed Care – PPO | Source: Ambulatory Visit | Attending: Radiation Oncology | Admitting: Radiation Oncology

## 2022-11-04 VITALS — BP 124/67 | HR 87 | Temp 98.4°F | Resp 20 | Ht 65.0 in | Wt 163.0 lb

## 2022-11-04 VITALS — BP 108/70 | HR 76 | Temp 98.4°F | Resp 18

## 2022-11-04 DIAGNOSIS — C3432 Malignant neoplasm of lower lobe, left bronchus or lung: Secondary | ICD-10-CM

## 2022-11-04 DIAGNOSIS — Z510 Encounter for antineoplastic radiation therapy: Secondary | ICD-10-CM | POA: Diagnosis not present

## 2022-11-04 DIAGNOSIS — Z87891 Personal history of nicotine dependence: Secondary | ICD-10-CM | POA: Diagnosis not present

## 2022-11-04 DIAGNOSIS — Z79899 Other long term (current) drug therapy: Secondary | ICD-10-CM | POA: Insufficient documentation

## 2022-11-04 DIAGNOSIS — Z5111 Encounter for antineoplastic chemotherapy: Secondary | ICD-10-CM | POA: Insufficient documentation

## 2022-11-04 LAB — CMP (CANCER CENTER ONLY)
ALT: 18 U/L (ref 0–44)
AST: 11 U/L — ABNORMAL LOW (ref 15–41)
Albumin: 3.9 g/dL (ref 3.5–5.0)
Alkaline Phosphatase: 54 U/L (ref 38–126)
Anion gap: 8 (ref 5–15)
BUN: 18 mg/dL (ref 6–20)
CO2: 28 mmol/L (ref 22–32)
Calcium: 9.2 mg/dL (ref 8.9–10.3)
Chloride: 100 mmol/L (ref 98–111)
Creatinine: 0.94 mg/dL (ref 0.61–1.24)
GFR, Estimated: >60 mL/min (ref >60)
Glucose, Bld: 128 mg/dL — ABNORMAL HIGH (ref 70–99)
Potassium: 3.6 mmol/L (ref 3.5–5.1)
Sodium: 136 mmol/L (ref 135–145)
Total Bilirubin: 0.2 mg/dL — ABNORMAL LOW (ref 0.3–1.2)
Total Protein: 6.9 g/dL (ref 6.5–8.1)

## 2022-11-04 LAB — RAD ONC ARIA SESSION SUMMARY
Course Elapsed Days: 42
Plan Fractions Treated to Date: 1
Plan Prescribed Dose Per Fraction: 2 Gy
Plan Total Fractions Prescribed: 3
Plan Total Prescribed Dose: 6 Gy
Reference Point Dosage Given to Date: 2 Gy
Reference Point Session Dosage Given: 2 Gy
Session Number: 31

## 2022-11-04 LAB — CBC WITH DIFFERENTIAL (CANCER CENTER ONLY)
Abs Immature Granulocytes: 0.8 10*3/uL — ABNORMAL HIGH (ref 0.00–0.07)
Basophils Absolute: 0 10*3/uL (ref 0.0–0.1)
Basophils Relative: 0 %
Eosinophils Absolute: 0 10*3/uL (ref 0.0–0.5)
Eosinophils Relative: 0 %
HCT: 36.4 % — ABNORMAL LOW (ref 39.0–52.0)
Hemoglobin: 12.7 g/dL — ABNORMAL LOW (ref 13.0–17.0)
Lymphocytes Relative: 6 %
Lymphs Abs: 0.6 10*3/uL — ABNORMAL LOW (ref 0.7–4.0)
MCH: 30.2 pg (ref 26.0–34.0)
MCHC: 34.9 g/dL (ref 30.0–36.0)
MCV: 86.5 fL (ref 80.0–100.0)
Metamyelocytes Relative: 3 %
Monocytes Absolute: 1.5 10*3/uL — ABNORMAL HIGH (ref 0.1–1.0)
Monocytes Relative: 14 %
Myelocytes: 5 %
Neutro Abs: 7.6 10*3/uL (ref 1.7–7.7)
Neutrophils Relative %: 72 %
Platelet Count: 322 10*3/uL (ref 150–400)
RBC: 4.21 MIL/uL — ABNORMAL LOW (ref 4.22–5.81)
RDW: 15.9 % — ABNORMAL HIGH (ref 11.5–15.5)
Smear Review: NORMAL
WBC Count: 10.5 10*3/uL (ref 4.0–10.5)
nRBC: 0.5 % — ABNORMAL HIGH (ref 0.0–0.2)

## 2022-11-04 LAB — LACTATE DEHYDROGENASE: LDH: 185 U/L (ref 98–192)

## 2022-11-04 MED ORDER — SODIUM CHLORIDE 0.9 % IV SOLN: 10.0000 mg | Freq: Once | INTRAVENOUS | Status: DC

## 2022-11-04 MED ORDER — SODIUM CHLORIDE 0.9% FLUSH
10.0000 mL | INTRAVENOUS | Status: DC | PRN
  Administered 2022-11-04: 10 mL

## 2022-11-04 MED ORDER — SODIUM CHLORIDE 0.9 % IV SOLN: Freq: Once | INTRAVENOUS | Status: AC

## 2022-11-04 MED ORDER — HEPARIN SOD (PORK) LOCK FLUSH 100 UNIT/ML IV SOLN
500.0000 [IU] | Freq: Once | INTRAVENOUS | Status: AC | PRN
  Administered 2022-11-04: 500 [IU]

## 2022-11-04 MED ORDER — CYANOCOBALAMIN 1000 MCG/ML IJ SOLN
1000.0000 ug | Freq: Once | INTRAMUSCULAR | Status: AC
  Administered 2022-11-04: 1000 ug via INTRAMUSCULAR
  Filled 2022-11-04: qty 1

## 2022-11-04 MED ORDER — SODIUM CHLORIDE 0.9 % IV SOLN
618.0000 mg | Freq: Once | INTRAVENOUS | Status: AC
  Administered 2022-11-04: 620 mg via INTRAVENOUS
  Filled 2022-11-04: qty 62

## 2022-11-04 MED ORDER — SODIUM CHLORIDE 0.9 % IV SOLN
150.0000 mg | Freq: Once | INTRAVENOUS | Status: AC
  Administered 2022-11-04: 150 mg via INTRAVENOUS
  Filled 2022-11-04: qty 150

## 2022-11-04 MED ORDER — SODIUM CHLORIDE 0.9 % IV SOLN
500.0000 mg/m2 | Freq: Once | INTRAVENOUS | Status: AC
  Administered 2022-11-04: 1000 mg via INTRAVENOUS
  Filled 2022-11-04: qty 40

## 2022-11-04 MED ORDER — PALONOSETRON HCL INJECTION 0.25 MG/5ML
0.2500 mg | Freq: Once | INTRAVENOUS | Status: AC
  Administered 2022-11-04: 0.25 mg via INTRAVENOUS
  Filled 2022-11-04: qty 5

## 2022-11-04 NOTE — Progress Notes (Signed)
Patient received his last chemo today, and will finish his radiation this week. He will need a PET prior to his next appointment. Scheduled for 12/18/2022.   Patient sent PET appointment including date, time, and location via MyChart. The following prep is reviewed : - arrive 30 minutes before appointment time - NPO except water for 6h before scan. No candy, no gum - hold any diabetic medication the morning of the scan - have a low carb breakfast   Radiology Information sheet also mailed to patient's home for reinforcement of education.  Oncology Nurse Navigator Documentation     11/04/2022    9:00 AM  Oncology Nurse Navigator Flowsheets  Navigator Follow Up Date: 12/18/2022  Navigator Follow Up Reason: Scan Review  Navigator Location [facility]  Navigator Encounter Type Treatment;Appt/Treatment Plan Review  Patient Visit Type MedOnc  Treatment Phase Active Tx  Barriers/Navigation Needs Coordination of Care;Education  Education Other  Interventions Coordination of Care;Education  Acuity Level 2-Minimal Needs (1-2 Barriers Identified)  Coordination of Care Radiology  Education Method Written  Support Groups/Services Friends and Family  Time Spent with Patient 30

## 2022-11-04 NOTE — Patient Instructions (Signed)

## 2022-11-04 NOTE — Progress Notes (Signed)
Hold dexamethasone today per Dr. Myna Hidalgo, patient is on a prednisone taper.

## 2022-11-04 NOTE — Progress Notes (Signed)
Hematology and Oncology Follow Up Visit  Brandon HobbyGregory Phillips [id number] 05-17-1966 57 y.o. 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 is back for follow-up.  He will finish up his chemotherapy and radiation therapy this week.  He really has done incredibly well.  I am very impressed with his resilience.  He does have some odynophagia and dysphagia.  However, he seems to be keeping down enough nutrition.  He has had no fever.  He has had no shortness of breath.  There has been no nausea or vomiting.  He has had no constipation or diarrhea.  There is been no leg swelling.  He has had no rashes.  His blood pressure is doing better on Micardis.  He has had no mouth sores.  He is still working.  I give a lot of credit for being as active as he has been.  His wife really is doing a great job helping him.  Overall, I would say that his performance status is probably ECOG 1.   Medications:  Current Outpatient Medications:    aspirin EC 81 MG tablet, Take 81 mg by mouth every evening. Swallow whole., Disp: , Rfl:    atorvastatin (LIPITOR) 20 MG tablet, TAKE ONE TABLET BY MOUTH DAILY, Disp: 90 tablet, Rfl: 3   azithromycin (ZITHROMAX) 250 MG tablet, Take two tablets on day 1, then 1 tablet daily till gone, Disp: 6 tablet, Rfl: 0   Budeson-Glycopyrrol-Formoterol (BREZTRI AEROSPHERE) 160-9-4.8 MCG/ACT AERO, Inhale 2 puffs into the lungs in the morning and at bedtime., Disp: 1 each, Rfl: 11   COENZYME Q10 PO, Take 100 mg by mouth in the morning., Disp: , Rfl:    folic acid (FOLVITE) 1 MG tablet, Take 1 tablet (1 mg total) by mouth daily. Start 7 days before pemetrexed chemotherapy. Continue until 21 days after pemetrexed completed., Disp: 100 tablet, Rfl: 3   GUAIFENESIN 1200 PO, Take 1,200 mg by mouth in the  morning and at bedtime., Disp: , Rfl:    HYDROcodone-acetaminophen (HYCET) 7.5-325 mg/15 ml solution, Take 10-15 mLs by mouth every 6 (six) hours as needed for moderate pain. For cancer related pain, Disp: 473 mL, Rfl: 0   lidocaine-prilocaine (EMLA) cream, Apply a dime size to port-a-cath 1-2 hours prior to access. Cover with saran wrap., Disp: 30 g, Rfl: 2   predniSONE (DELTASONE) 10 MG tablet, Take 4 tabs by mouth for 3 days, then 3 for 3 days, 2 for 3 days, 1 for 3 days and stop, Disp: 30 tablet, Rfl: 0   sucralfate (CARAFATE) 1 g tablet, Take 1 tablet (1 g total) by mouth 4 (four) times daily. Dissolve each tablet in 15 cc water before use., Disp: 120 tablet, Rfl: 2   telmisartan (MICARDIS) 40 MG tablet, Take 1 tablet (40 mg total) by mouth daily., Disp: 30 tablet, Rfl: 4   albuterol (VENTOLIN HFA) 108 (90 Base) MCG/ACT inhaler, Inhale 2 puffs into the lungs every 6 (six) hours as needed for wheezing. (Patient not taking: Reported on 11/04/2022), Disp: 2 each, Rfl: 11   dexamethasone (DECADRON) 4 MG tablet, Take 1 tab 2 times daily starting day before pemetrexed. Then take 2 tabs daily x 3 days starting day after carboplatin. Take with food. (Patient not taking: Reported on 11/04/2022), Disp: 30 tablet, Rfl:  1   ondansetron (ZOFRAN) 8 MG tablet, Take 1 tablet (8 mg total) by mouth every 8 (eight) hours as needed for nausea or vomiting. Start on the third day after carboplatin. (Patient not taking: Reported on 11/04/2022), Disp: 30 tablet, Rfl: 1   prochlorperazine (COMPAZINE) 10 MG tablet, Take 1 tablet (10 mg total) by mouth every 6 (six) hours as needed for nausea or vomiting. (Patient not taking: Reported on 11/04/2022), Disp: 30 tablet, Rfl: 1  Allergies: No Known Allergies  Past Medical History, Surgical history, Social history, and Family History were reviewed and updated.  Review of Systems: Review of Systems  Constitutional: Negative.   HENT:  Negative.    Eyes: Negative.   Respiratory:   Positive for cough.   Cardiovascular: Negative.   Gastrointestinal: Negative.   Endocrine: Negative.   Genitourinary: Negative.    Musculoskeletal: Negative.   Skin: Negative.   Neurological: Negative.   Hematological: Negative.   Psychiatric/Behavioral: Negative.      Physical Exam:  height is 5\' 5"  (1.651 m) and weight is 163 lb (73.9 kg). His oral temperature is 98.4 F (36.9 C). His blood pressure is 124/67 and his pulse is 87. His respiration is 20 and oxygen saturation is 93%.   Wt Readings from Last 3 Encounters:  11/04/22 163 lb (73.9 kg)  10/29/22 161 lb 3.2 oz (73.1 kg)  10/14/22 166 lb (75.3 kg)    Physical Exam Vitals reviewed.  HENT:     Head: Normocephalic and atraumatic.  Eyes:     Pupils: Pupils are equal, round, and reactive to light.  Cardiovascular:     Rate and Rhythm: Normal rate and regular rhythm.     Heart sounds: Normal heart sounds.  Pulmonary:     Effort: Pulmonary effort is normal.     Breath sounds: Normal breath sounds.  Abdominal:     General: Bowel sounds are normal.     Palpations: Abdomen is soft.  Musculoskeletal:        General: No tenderness or deformity. Normal range of motion.     Cervical back: Normal range of motion.  Lymphadenopathy:     Cervical: No cervical adenopathy.  Skin:    General: Skin is warm and dry.     Findings: No erythema or rash.  Neurological:     Mental Status: He is alert and oriented to person, place, and time.  Psychiatric:        Behavior: Behavior normal.        Thought Content: Thought content normal.        Judgment: Judgment normal.    Lab Results  Component Value Date   WBC 10.5 11/04/2022   HGB 12.7 (L) 11/04/2022   HCT 36.4 (L) 11/04/2022   MCV 86.5 11/04/2022   PLT 322 11/04/2022     Chemistry      Component Value Date/Time   NA 137 10/14/2022 0852   K 4.0 10/14/2022 0852   CL 102 10/14/2022 0852   CO2 24 10/14/2022 0852   BUN 14 10/14/2022 0852   CREATININE 1.04 10/14/2022  0852   CREATININE 1.05 01/11/2022 0000      Component Value Date/Time   CALCIUM 9.4 10/14/2022 0852   ALKPHOS 57 10/14/2022 0852   AST 12 (L) 10/14/2022 0852   ALT 26 10/14/2022 0852   BILITOT 0.3 10/14/2022 0852      Impression and Plan: Mr. Bonadio is a very nice 57 year old white male.  He has a locally advanced adenocarcinoma  of the left lung.  Again, he does not wish to have surgery.  He has seen Dr. Dorris Fetch of thoracic surgery.  He is getting combined chemotherapy and radiation therapy.  He is doing quite nicely with this so far.  Again, he will finish up this week.  We will probably set him up with a PET scan in about 6 weeks.  I would like to think that he is responding.  Maybe, if he does not respond, then we could think about the possibility of surgical resection.  If we do see good response, then we will have him on maintenance immunotherapy.  I will plan to see him back after he has his PET scan done.   [name], MD 4/8/20248:33 AM

## 2022-11-04 NOTE — Patient Instructions (Signed)
[facility]  Discharge Instructions: Thank you for choosing [facility] to provide your oncology and hematology care.   If you have a lab appointment with the Cancer Center, please go directly to the Cancer Center and check in at the registration area.  Wear comfortable clothing and clothing appropriate for easy access to any Portacath or PICC line.   We strive to give you quality time with your provider. You may need to reschedule your appointment if you arrive late (15 or more minutes).  Arriving late affects you and other patients whose appointments are after yours.  Also, if you miss three or more appointments without notifying the office, you may be dismissed from the clinic at the provider's discretion.      For prescription refill requests, have your pharmacy contact our office and allow 72 hours for refills to be completed.    Today you received the following chemotherapy and/or immunotherapy agents Alimta, Carboplatin, Vitamin B 12 injeciton.    To help prevent nausea and vomiting after your treatment, we encourage you to take your nausea medication as directed.  BELOW ARE SYMPTOMS THAT SHOULD BE REPORTED IMMEDIATELY: *FEVER GREATER THAN 100.4 F (38 C) OR HIGHER *CHILLS OR SWEATING *NAUSEA AND VOMITING THAT IS NOT CONTROLLED WITH YOUR NAUSEA MEDICATION *UNUSUAL SHORTNESS OF BREATH *UNUSUAL BRUISING OR BLEEDING *URINARY PROBLEMS (pain or burning when urinating, or frequent urination) *BOWEL PROBLEMS (unusual diarrhea, constipation, pain near the anus) TENDERNESS IN MOUTH AND THROAT WITH OR WITHOUT PRESENCE OF ULCERS (sore throat, sores in mouth, or a toothache) UNUSUAL RASH, SWELLING OR PAIN  UNUSUAL VAGINAL DISCHARGE OR ITCHING   Items with * indicate a potential emergency and should be followed up as soon as possible or go to the Emergency Department if any problems should occur.  Please show the CHEMOTHERAPY ALERT CARD or  IMMUNOTHERAPY ALERT CARD at check-in to the Emergency Department and triage nurse. Should you have questions after your visit or need to cancel or reschedule your appointment, please contact [facility] [place]  [phone] and follow the prompts.  Office hours are 8:00 a.m. to 4:30 p.m. Monday - Friday. Please note that voicemails left after 4:00 p.m. may not be returned until the following business day.  We are closed weekends and major holidays. You have access to a nurse at all times for urgent questions. Please call the main number to the clinic [phone] and follow the prompts.  For any non-urgent questions, you may also contact your provider using MyChart. We now offer e-Visits for anyone 11 and older to request care online for non-urgent symptoms. For details visit [email or web address].   Also download the MyChart app! Go to the app store, search "MyChart", open the app, select [facility], and log in with your MyChart username and password.

## 2022-11-05 ENCOUNTER — Ambulatory Visit: Payer: BC Managed Care – PPO

## 2022-11-05 ENCOUNTER — Other Ambulatory Visit: Payer: Self-pay

## 2022-11-05 ENCOUNTER — Ambulatory Visit
Admission: RE | Admit: 2022-11-05 | Discharge: 2022-11-05 | Disposition: A | Discharge status: Home / Self Care | Payer: BC Managed Care – PPO | Source: Ambulatory Visit | Attending: Radiation Oncology | Admitting: Radiation Oncology

## 2022-11-05 DIAGNOSIS — C3432 Malignant neoplasm of lower lobe, left bronchus or lung: Secondary | ICD-10-CM | POA: Diagnosis not present

## 2022-11-05 DIAGNOSIS — Z510 Encounter for antineoplastic radiation therapy: Secondary | ICD-10-CM | POA: Diagnosis not present

## 2022-11-05 DIAGNOSIS — Z5111 Encounter for antineoplastic chemotherapy: Secondary | ICD-10-CM | POA: Diagnosis not present

## 2022-11-05 DIAGNOSIS — Z87891 Personal history of nicotine dependence: Secondary | ICD-10-CM | POA: Diagnosis not present

## 2022-11-05 LAB — RAD ONC ARIA SESSION SUMMARY
Course Elapsed Days: 43
Plan Fractions Treated to Date: 2
Plan Prescribed Dose Per Fraction: 2 Gy
Plan Total Fractions Prescribed: 3
Plan Total Prescribed Dose: 6 Gy
Reference Point Dosage Given to Date: 4 Gy
Reference Point Session Dosage Given: 2 Gy
Session Number: 32

## 2022-11-05 NOTE — Progress Notes (Signed)
[employer] papers for FML[employer] competed.  Faxed with confirmation received.    Fax no. [phone]

## 2022-11-06 ENCOUNTER — Ambulatory Visit
Admission: RE | Admit: 2022-11-06 | Discharge: 2022-11-06 | Disposition: A | Discharge status: Home / Self Care | Payer: BC Managed Care – PPO | Source: Ambulatory Visit | Attending: Radiation Oncology | Admitting: Radiation Oncology

## 2022-11-06 ENCOUNTER — Encounter: Payer: Self-pay | Admitting: Radiation Oncology

## 2022-11-06 ENCOUNTER — Ambulatory Visit: Payer: BC Managed Care – PPO

## 2022-11-06 ENCOUNTER — Other Ambulatory Visit: Payer: Self-pay

## 2022-11-06 DIAGNOSIS — C3432 Malignant neoplasm of lower lobe, left bronchus or lung: Secondary | ICD-10-CM | POA: Diagnosis not present

## 2022-11-06 DIAGNOSIS — Z5111 Encounter for antineoplastic chemotherapy: Secondary | ICD-10-CM | POA: Diagnosis not present

## 2022-11-06 DIAGNOSIS — Z87891 Personal history of nicotine dependence: Secondary | ICD-10-CM | POA: Diagnosis not present

## 2022-11-06 DIAGNOSIS — Z510 Encounter for antineoplastic radiation therapy: Secondary | ICD-10-CM | POA: Diagnosis not present

## 2022-11-06 LAB — RAD ONC ARIA SESSION SUMMARY
Course Elapsed Days: 44
Plan Fractions Treated to Date: 3
Plan Prescribed Dose Per Fraction: 2 Gy
Plan Total Fractions Prescribed: 3
Plan Total Prescribed Dose: 6 Gy
Reference Point Dosage Given to Date: 6 Gy
Reference Point Session Dosage Given: 2 Gy
Session Number: 33

## 2022-11-07 ENCOUNTER — Ambulatory Visit: Payer: BC Managed Care – PPO

## 2022-11-08 ENCOUNTER — Ambulatory Visit: Payer: BC Managed Care – PPO

## 2022-11-11 ENCOUNTER — Encounter: Payer: Self-pay | Admitting: Hematology & Oncology

## 2022-11-11 ENCOUNTER — Ambulatory Visit: Payer: BC Managed Care – PPO

## 2022-11-11 NOTE — Progress Notes (Signed)
  Radiation Oncology         [phone] ________________________________  Name: Brandon Phillips MRN: [id number]  Date: 11/06/2022  DOB: [birth year]  End of Treatment Note  Diagnosis: Stage III, cT3N1M0, NSCLC, adenocarcinoma of the LLL.   Indication for treatment: Curative       Radiation treatment dates:   09/23/22-11/06/22  Site/dose:   The patient was treated to the disease within the LLL lung initially to a dose of 60 Gy using a 4 field, 3-D conformal technique. The patient then received a cone down boost treatment for an additional 6 Gy. This yielded a final total dose of 66 Gy.   Narrative: The patient tolerated radiation treatment relatively well.  He developed fatigue and anticipated esophagitis during therapy and was treated with Carafate and Hycet.  Plan: The patient will receive a call in about one month from the radiation oncology department. He will continue follow up with Dr. Myna Hidalgo as well.       Osker Mason, PAC

## 2022-11-13 ENCOUNTER — Other Ambulatory Visit: Payer: Self-pay | Admitting: Radiology

## 2022-11-13 DIAGNOSIS — C3432 Malignant neoplasm of lower lobe, left bronchus or lung: Secondary | ICD-10-CM

## 2022-11-13 MED ORDER — HYDROCODONE-ACETAMINOPHEN 7.5-325 MG/15ML PO SOLN
10.0000 mL | Freq: Four times a day (QID) | ORAL | 0 refills | Status: DC | PRN
Start: 2022-11-13 — End: 2022-11-19

## 2022-11-13 NOTE — Progress Notes (Signed)
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 Refill sent to his pharmacy today.

## 2022-11-15 ENCOUNTER — Other Ambulatory Visit: Payer: Self-pay

## 2022-11-19 ENCOUNTER — Encounter: Payer: Self-pay | Admitting: Nurse Practitioner

## 2022-11-19 ENCOUNTER — Ambulatory Visit (INDEPENDENT_AMBULATORY_CARE_PROVIDER_SITE_OTHER): Payer: BC Managed Care – PPO | Admitting: Nurse Practitioner

## 2022-11-19 ENCOUNTER — Ambulatory Visit (INDEPENDENT_AMBULATORY_CARE_PROVIDER_SITE_OTHER): Payer: BC Managed Care – PPO

## 2022-11-19 VITALS — BP 114/64 | HR 97 | Temp 97.9°F | Ht 65.0 in | Wt 169.0 lb

## 2022-11-19 DIAGNOSIS — R058 Other specified cough: Secondary | ICD-10-CM

## 2022-11-19 DIAGNOSIS — J441 Chronic obstructive pulmonary disease with (acute) exacerbation: Secondary | ICD-10-CM

## 2022-11-19 DIAGNOSIS — J019 Acute sinusitis, unspecified: Secondary | ICD-10-CM | POA: Diagnosis not present

## 2022-11-19 DIAGNOSIS — R059 Cough, unspecified: Secondary | ICD-10-CM | POA: Diagnosis not present

## 2022-11-19 MED ORDER — PREDNISONE 10 MG PO TABS
ORAL_TABLET | ORAL | 0 refills | Status: DC
Start: 2022-11-19 — End: 2022-12-09

## 2022-11-19 MED ORDER — ALBUTEROL SULFATE (2.5 MG/3ML) 0.083% IN NEBU
2.5000 mg | INHALATION_SOLUTION | Freq: Once | RESPIRATORY_TRACT | Status: AC
Start: 2022-11-19 — End: 2022-11-19
  Administered 2022-11-19: 2.5 mg via RESPIRATORY_TRACT

## 2022-11-19 MED ORDER — ALBUTEROL SULFATE (2.5 MG/3ML) 0.083% IN NEBU
2.5000 mg | INHALATION_SOLUTION | Freq: Four times a day (QID) | RESPIRATORY_TRACT | 5 refills | Status: AC | PRN
Start: 2022-11-19 — End: ?

## 2022-11-19 MED ORDER — DOXYCYCLINE HYCLATE 100 MG PO TABS
100.0000 mg | ORAL_TABLET | Freq: Two times a day (BID) | ORAL | 0 refills | Status: AC
Start: 2022-11-19 — End: 2022-11-26

## 2022-11-19 MED ORDER — METHYLPREDNISOLONE ACETATE 80 MG/ML IJ SUSP
80.0000 mg | Freq: Once | INTRAMUSCULAR | Status: AC
Start: 2022-11-19 — End: 2022-11-19
  Administered 2022-11-19: 80 mg via INTRAMUSCULAR

## 2022-11-19 MED ORDER — BENZONATATE 200 MG PO CAPS
200.0000 mg | ORAL_CAPSULE | Freq: Three times a day (TID) | ORAL | 1 refills | Status: DC | PRN
Start: 2022-11-19 — End: 2023-07-10

## 2022-11-19 NOTE — Assessment & Plan Note (Signed)
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 CXR today without superimposed infection. We will cover him with empiric doxycycline and extended prednisone taper. Advised on mucociliary clearance therapies and cough control measures. He will continue triple therapy regimen. Action plan in place.  Patient Instructions  Continue Albuterol inhaler 2 puffs or 3 mL neb every 6 hours as needed for shortness of breath or wheezing. Use nebs 2-4 times a day until symptoms improve. Follow with flutter valve Continue Breztri 2 puffs Twice daily. Brush tongue and rinse mouth afterwards  Continue guaifenesin 1200 mg Twice daily for chest congestion/cough  Doxycycline 1 tab Twice daily for 7 days. Take with food. Wear sunscreen when outside and avoid excessive sun exposure  Prednisone taper. 4 tabs for 4 days, then 3 tabs for 4 days, 2 tabs for 4 days, then 1 tab for 4 days, then stop. Take in AM with food. Start tomorrow  Benzonatate 1 capsule Three times a day for cough. Use consistently over the next few days to help calm your airways then as needed Delsym 2 tsp Twice daily as needed for coughing Flonase nasal spray 2 sprays each nostril daily for nasal congestion/drainage   Follow up in 2 weeks with Dr. Thora Lance or Philis Nettle. If symptoms do not improve or worsen, please contact office for sooner follow up or seek emergency care.

## 2022-11-19 NOTE — Progress Notes (Signed)
@Patient  ID: Brandon Phillips, male    DOB: [birth year], 57 y.o.   MRN: [id number]  Chief Complaint  Patient presents with   Acute Visit    Pt is here due to complaints of a cough. States he has had the cough since last OV and it has not gotten any better. Cough is worse at night when laying down for bed and then in the morning when he first gets up. Pt will occasionally cough up thick yellow phlegm, has some wheezing as well as chest discomfort.    Referring provider: [name], NP  HPI: 57 year old male, former smoker followed for NSCLC of left lung and COPD. He is a patient of Dr. [name] and last seen in office 10/29/2022. Past medical history significant for HTN, allergic rhinitis.   TEST/EVENTS:  07/25/2022 LDCT chest: Atherosclerosis.  Spiculated solid central left lower lobe lung mass measuring 35 mm, new.  Solid peripheral right upper lobe pulmonary nodule measuring 7.9 mm. 08/14/2022 PET scan: Low-level hypermetabolism of the right upper lobe pulmonary nodule.  Hypermetabolism of the left lower lobe lung mass and ipsilateral hilar nodal metastasis. 08/26/2022 PFT: FVC 66, FEV1 42, ratio 48, TLC 122, DLCOcor 57.  No BD.  10/29/2022: OV with Dr. Thora Lance.  Undergoing radiation for stage IIb NSCLC left lower lobe, neoadjuvant chemotherapy with subsequent plan for restaging.  Resection potentially offered though he is hopeful to avoid surgery.  Does feel like his dyspnea has been worse since last visit.  He has had cough with yellow sputum production over the last 6 days.  He also increased pain with swallowing since starting XRT.  Treated for AE COPD with Z-Pak and prednisone taper.  Switched from Symbicort to [employer].  Advised to call if symptoms did not improve.  11/19/2022: Today - acute Patient presents today for acute visit with his wife.  He was seen 4/2 and treated for a COPD with Z-Pak and prednisone taper.  He tells me that when he was on the prednisone, he was feeling a little bit  better.  His wife was not noticing him wheeze.  Since he has finished these medications, he feels like he is more short of breath, has more chest congestion and wheezing and is still producing yellow phlegm.  Cough tends to be worse at night and then in the morning when he first gets up.  He does have some nasal drainage.  Denies any fevers, night sweats, hemoptysis, lower extremity swelling, orthopnea.  No interim sick exposures.  Using Murphys twice daily.  Does not have a neb machine at home. Eating and drinking well.   No Known Allergies  Immunization History  Administered Date(s) Administered   PFIZER(Purple Top)SARS-COV-2 Vaccination 08/03/2020, 08/25/2020   Pneumococcal Polysaccharide-23 01/15/2018   Tdap 01/15/2018   Zoster Recombinat (Shingrix) 07/30/2019, 11/27/2019    Past Medical History:  Diagnosis Date   Cancer    lung cancer   CAP (community acquired pneumonia)    COPD (chronic obstructive pulmonary disease)    Eczema    HLD (hyperlipidemia)    Primary cancer of left lower lobe of lung 09/17/2022   Tobacco use disorder 01/24/2017    Tobacco History: Social History   Tobacco Use  Smoking Status Former   Packs/day: 1.50   Years: 35.00   Additional pack years: 0.00   Total pack years: 52.50   Types: Cigarettes   Quit date: 03/30/2017   Years since quitting: 5.6  Smokeless Tobacco Never   Counseling given: Not  Answered   Outpatient Medications Prior to Visit  Medication Sig Dispense Refill   albuterol (VENTOLIN HFA) 108 (90 Base) MCG/ACT inhaler Inhale 2 puffs into the lungs every 6 (six) hours as needed for wheezing. 2 each 11   aspirin EC 81 MG tablet Take 81 mg by mouth every evening. Swallow whole.     atorvastatin (LIPITOR) 20 MG tablet TAKE ONE TABLET BY MOUTH DAILY 90 tablet 3   Budeson-Glycopyrrol-Formoterol (BREZTRI AEROSPHERE) 160-9-4.8 MCG/ACT AERO Inhale 2 puffs into the lungs in the morning and at bedtime. 1 each 11   COENZYME Q10 PO Take 100 mg by  mouth in the morning.     folic acid (FOLVITE) 1 MG tablet Take 1 tablet (1 mg total) by mouth daily. Start 7 days before pemetrexed chemotherapy. Continue until 21 days after pemetrexed completed. 100 tablet 3   GUAIFENESIN 1200 PO Take 1,200 mg by mouth in the morning and at bedtime.     telmisartan (MICARDIS) 40 MG tablet Take 1 tablet (40 mg total) by mouth daily. 30 tablet 4   lidocaine-prilocaine (EMLA) cream Apply a dime size to port-a-cath 1-2 hours prior to access. Cover with saran wrap. (Patient not taking: Reported on 11/19/2022) 30 g 2   azithromycin (ZITHROMAX) 250 MG tablet Take two tablets on day 1, then 1 tablet daily till gone 6 tablet 0   dexamethasone (DECADRON) 4 MG tablet Take 1 tab 2 times daily starting day before pemetrexed. Then take 2 tabs daily x 3 days starting day after carboplatin. Take with food. (Patient not taking: Reported on 11/04/2022) 30 tablet 1   HYDROcodone-acetaminophen (HYCET) 7.5-325 mg/15 ml solution Take 10-15 mLs by mouth every 6 (six) hours as needed for moderate pain. For cancer related pain 473 mL 0   ondansetron (ZOFRAN) 8 MG tablet Take 1 tablet (8 mg total) by mouth every 8 (eight) hours as needed for nausea or vomiting. Start on the third day after carboplatin. (Patient not taking: Reported on 11/04/2022) 30 tablet 1   predniSONE (DELTASONE) 10 MG tablet Take 4 tabs by mouth for 3 days, then 3 for 3 days, 2 for 3 days, 1 for 3 days and stop 30 tablet 0   prochlorperazine (COMPAZINE) 10 MG tablet Take 1 tablet (10 mg total) by mouth every 6 (six) hours as needed for nausea or vomiting. (Patient not taking: Reported on 11/04/2022) 30 tablet 1   sucralfate (CARAFATE) 1 g tablet Take 1 tablet (1 g total) by mouth 4 (four) times daily. Dissolve each tablet in 15 cc water before use. 120 tablet 2   No facility-administered medications prior to visit.     Review of Systems:   Constitutional: No weight loss or gain, night sweats, fevers, chills, or lassitude.  +fatigue  HEENT: No headaches, difficulty swallowing, tooth/dental problems, or sore throat. No sneezing, itching, ear ache. +nasal congestion, post nasal drip CV:  No chest pain, orthopnea, PND, swelling in lower extremities, anasarca, dizziness, palpitations, syncope Resp: +shortness of breath with exertion; productive cough; wheezing. No hemoptysis. No chest wall deformity GI:  No heartburn, indigestion, abdominal pain, nausea, vomiting, diarrhea, change in bowel habits, loss of appetite, bloody stools.  GU: No dysuria, change in color of urine, urgency or frequency.  No flank pain, no hematuria  Skin: No rash, lesions, ulcerations MSK:  No joint pain or swelling.   Neuro: No dizziness or lightheadedness.  Psych: No depression or anxiety. Mood stable.     Physical Exam:  BP 114/64 (BP  Location: Right Arm, Patient Position: Sitting, Cuff Size: Normal)   Pulse 97   Temp 97.9 F (36.6 C) (Oral)   Ht  (1.651 m)   Wt 169 lb (76.7 kg)   SpO2 97% Comment: RA  BMI 28.12 kg/m   GEN: Pleasant, interactive, well-appearing; in no acute distress. HEENT:  Normocephalic and atraumatic. PERRLA. Sclera white. Nasal turbinates pink, moist and patent bilaterally. No rhinorrhea present. Oropharynx pink and moist, without exudate or edema. No lesions, ulcerations, or postnasal drip.  NECK:  Supple w/ fair ROM. No JVD present. Normal carotid impulses w/o bruits. Thyroid symmetrical with no goiter or nodules palpated. No lymphadenopathy.   CV: RRR, no m/r/g, no peripheral edema. Pulses intact, +2 bilaterally. No cyanosis, pallor or clubbing. PULMONARY:  Unlabored, regular breathing. Diminished bilaterally A&P w mild end expiratory wheeze. No accessory muscle use.  GI: BS present and normoactive. Soft, non-tender to palpation. No organomegaly or masses detected.  MSK: No erythema, warmth or tenderness. Cap refil <2 sec all extrem. No deformities or joint swelling noted.  Neuro: A/Ox3. No focal  deficits noted.   Skin: Warm, no lesions or rashe Psych: Normal affect and behavior. Judgement and thought content appropriate.     Lab Results:  CBC    Component Value Date/Time   WBC 10.5 11/04/2022 0805   WBC 9.4 01/11/2022 0000   RBC 4.21 (L) 11/04/2022 0805   HGB 12.7 (L) 11/04/2022 0805   HCT 36.4 (L) 11/04/2022 0805   PLT 322 11/04/2022 0805   MCV 86.5 11/04/2022 0805   MCH 30.2 11/04/2022 0805   MCHC 34.9 11/04/2022 0805   RDW 15.9 (H) 11/04/2022 0805   LYMPHSABS 0.6 (L) 11/04/2022 0805   MONOABS 1.5 (H) 11/04/2022 0805   EOSABS 0.0 11/04/2022 0805   BASOSABS 0.0 11/04/2022 0805    BMET    Component Value Date/Time   NA 136 11/04/2022 0805   K 3.6 11/04/2022 0805   CL 100 11/04/2022 0805   CO2 28 11/04/2022 0805   GLUCOSE 128 (H) 11/04/2022 0805   BUN 18 11/04/2022 0805   CREATININE 0.94 11/04/2022 0805   CREATININE 1.05 01/11/2022 0000   CALCIUM 9.2 11/04/2022 0805   GFRNONAA >60 11/04/2022 0805   GFRNONAA 88 04/06/2020 0708   GFRAA 102 04/06/2020 0708    BNP No results found for: "BNP"   Imaging:  DG Chest 2 View  Result Date: 11/19/2022 CLINICAL DATA:  Productive cough EXAM: CHEST - 2 VIEW COMPARISON:  08/29/2022 FINDINGS: Cardiac shadow is within normal limits. New right chest wall port is noted in satisfactory position. Fiducial marker is again noted in the right upper and left lower lobes. Persistent left lower lobe mass lesion is noted posteriorly. No infiltrate or effusion is seen. No bony abnormality is noted. IMPRESSION: Stable left lower lobe mass. New right-sided chest wall port. No acute abnormality noted. Electronically Signed   By: Alcide Clever M.D.   On: 11/19/2022 10:39   IR Radiologist Eval & Mgmt  Result Date: 10/21/2022 EXAM: Established PATIENT OFFICE VISIT CHIEF COMPLAINT: Patient with port-a-catheter placed in IR 10/03/2022. Patient now presents with pain, erythema and pus at the supraclavicular region procedural site. Current  Pain Level: 4/10 with palpation. HISTORY OF PRESENT ILLNESS: Please see note in epic REVIEW OF SYSTEMS: Please see note in epic PHYSICAL EXAMINATION: Please see note in epic ASSESSMENT AND PLAN: Please see note in epic. Read by: Alwyn Ren, NP Electronically Signed   By: Holland Commons.D.  On: 10/21/2022 17:24    albuterol (PROVENTIL) (2.5 MG/3ML) 0.083% nebulizer solution 2.5 mg     Date Action Dose Route User   11/19/2022 1055 Given 2.5 mg Nebulization Pinion, Emily P, CMA      CARBOplatin (PARAPLATIN) 600 mg in sodium chloride 0.9 % 250 mL chemo infusion     Date Action Dose Route User   09/23/2022 1203 New Bag/Given 600 mg Intravenous Lenn Sink I, RN      CARBOplatin (PARAPLATIN) 570.5 mg in sodium chloride 0.9 % 250 mL chemo infusion     Date Action Dose Route User   10/14/2022 1158 Rate/Dose Change (none) Intravenous Margaretha Seeds, RN   10/14/2022 1158 Rate/Dose Change (none) Intravenous Margaretha Seeds, RN   10/14/2022 1128 Rate/Dose Change (none) Intravenous Margaretha Seeds, RN   10/14/2022 1128 New Bag/Given 570.5 mg Intravenous Margaretha Seeds, RN      CARBOplatin (PARAPLATIN) 620 mg in sodium chloride 0.9 % 250 mL chemo infusion     Date Action Dose Route User   11/04/2022 1049 New Bag/Given 620 mg Intravenous Lenn Sink I, RN      cyanocobalamin (VITAMIN B12) injection 1,000 mcg     Date Action Dose Route User   11/04/2022 1123 Given 1,000 mcg Intramuscular (Left Deltoid) Lenn Sink I, RN      dexamethasone (DECADRON) 10 mg in sodium chloride 0.9 % 50 mL IVPB     Date Action Dose Route User   09/23/2022 1020 New Bag/Given 10 mg Intravenous Lenn Sink I, RN      dexamethasone (DECADRON) 10 mg in sodium chloride 0.9 % 50 mL IVPB     Date Action Dose Route User   10/14/2022 1018 Infusion Verify (none) Intravenous Margaretha Seeds, RN   10/14/2022 1002 Rate/Dose Change (none) Intravenous Margaretha Seeds, RN   10/14/2022 1002 New Bag/Given 10 mg  Intravenous Margaretha Seeds, RN      fosaprepitant (EMEND) 150 mg in sodium chloride 0.9 % 145 mL IVPB     Date Action Dose Route User   09/23/2022 1044 New Bag/Given 150 mg Intravenous Lenn Sink I, RN      fosaprepitant (EMEND) 150 mg in sodium chloride 0.9 % 145 mL IVPB     Date Action Dose Route User   10/14/2022 1019 Infusion Verify (none) Intravenous Margaretha Seeds, RN   10/14/2022 1004 Rate/Dose Change (none) Intravenous Margaretha Seeds, RN   10/14/2022 1004 New Bag/Given 150 mg Intravenous Margaretha Seeds, RN      fosaprepitant (EMEND) 150 mg in sodium chloride 0.9 % 145 mL IVPB     Date Action Dose Route User   11/04/2022 0918 New Bag/Given 150 mg Intravenous Lenn Sink I, RN      heparin lock flush 100 unit/mL     Date Action Dose Route User   10/14/2022 1202 Given 500 Units Intracatheter Margaretha Seeds, RN      heparin lock flush 100 unit/mL     Date Action Dose Route User   11/04/2022 1123 Given 500 Units Intracatheter [place], [place] I, RN      methylPREDNISolone acetate (DEPO-MEDROL) injection 80 mg     Date Action Dose Route User   11/19/2022 1054 Given 80 mg Intramuscular (Left Upper Outer Quadrant) Pinion, Farley Ly, CMA      palonosetron (ALOXI) injection 0.25 mg     Date Action Dose Route User   09/23/2022 1014 Given 0.25 mg Intravenous Lenn Sink I, RN  palonosetron (ALOXI) injection 0.25 mg     Date Action Dose Route User   10/14/2022 0956 Given 0.25 mg Intravenous Margaretha Seeds, RN      palonosetron (ALOXI) injection 0.25 mg     Date Action Dose Route User   11/04/2022 0913 Given 0.25 mg Intravenous Lenn Sink I, RN      PEMEtrexed (ALIMTA) 1,000 mg in sodium chloride 0.9 % 100 mL chemo infusion     Date Action Dose Route User   09/23/2022 1145 New Bag/Given 1,000 mg Intravenous Lenn Sink I, RN      PEMEtrexed (ALIMTA) 1,000 mg in sodium chloride 0.9 % 100 mL chemo infusion     Date Action Dose Route User    10/14/2022 1109 Rate/Dose Change (none) Intravenous Margaretha Seeds, RN   10/14/2022 1108 Rate/Dose Change (none) Intravenous Margaretha Seeds, RN   10/14/2022 1057 Infusion Verify (none) Intravenous Margaretha Seeds, RN   10/14/2022 1057 New Bag/Given 1,000 mg Intravenous Margaretha Seeds, RN      PEMEtrexed (ALIMTA) 1,000 mg in sodium chloride 0.9 % 100 mL chemo infusion     Date Action Dose Route User   11/04/2022 1029 New Bag/Given 1,000 mg Intravenous Lenn Sink I, RN      0.9 %  sodium chloride infusion     Date Action Dose Route User   09/23/2022 1013 New Bag/Given (none) Intravenous [place], Misty [place] I, RN      0.9 %  sodium chloride infusion     Date Action Dose Route User   10/14/2022 1203 Rate/Dose Change (none) Intravenous Margaretha Seeds, RN   10/14/2022 1159 Rate/Dose Change (none) Intravenous Margaretha Seeds, RN   10/14/2022 1115 Infusion Verify (none) Intravenous Margaretha Seeds, RN   10/14/2022 1115 Rate/Dose Change (none) Intravenous Margaretha Seeds, RN   10/14/2022 1109 Rate/Dose Change (none) Intravenous Margaretha Seeds, RN      0.9 %  sodium chloride infusion     Date Action Dose Route User   11/04/2022 0912 New Bag/Given (none) Intravenous Lenn Sink I, RN      sodium chloride flush (NS) 0.9 % injection 10 mL     Date Action Dose Route User   10/14/2022 1202 Given 10 mL Intracatheter Margaretha Seeds, RN      sodium chloride flush (NS) 0.9 % injection 10 mL     Date Action Dose Route User   11/04/2022 1123 Given 10 mL Intracatheter [place], Misty Stanley I, RN          Latest Ref Rng & Units 08/26/2022    3:25 PM  PFT Results  FVC-Pre L 2.75   FVC-Predicted Pre % 66   FVC-Post L 2.77   FVC-Predicted Post % 67   Pre FEV1/FVC % % 48   Post FEV1/FCV % % 48   FEV1-Pre L 1.33   FEV1-Predicted Pre % 42   FEV1-Post L 1.33   DLCO uncorrected ml/min/mmHg 15.07   DLCO UNC% % 61   DLCO corrected ml/min/mmHg 14.04   DLCO COR %Predicted % 57   DLVA Predicted %  60   TLC L 7.30   TLC % Predicted % 122   RV % Predicted % 235     No results found for: "NITRICOXIDE"      Assessment & Plan:   COPD with acute exacerbation Recurrent AECOPD following completion of previous abx and prednisone. CXR today without superimposed infection. We will cover him with empiric doxycycline and extended prednisone  taper. Advised on mucociliary clearance therapies and cough control measures. He will continue triple therapy regimen. Action plan in place.  Patient Instructions  Continue Albuterol inhaler 2 puffs or 3 mL neb every 6 hours as needed for shortness of breath or wheezing. Use nebs 2-4 times a day until symptoms improve. Follow with flutter valve Continue Breztri 2 puffs Twice daily. Brush tongue and rinse mouth afterwards  Continue guaifenesin 1200 mg Twice daily for chest congestion/cough  Doxycycline 1 tab Twice daily for 7 days. Take with food. Wear sunscreen when outside and avoid excessive sun exposure  Prednisone taper. 4 tabs for 4 days, then 3 tabs for 4 days, 2 tabs for 4 days, then 1 tab for 4 days, then stop. Take in AM with food. Start tomorrow  Benzonatate 1 capsule Three times a day for cough. Use consistently over the next few days to help calm your airways then as needed Delsym 2 tsp Twice daily as needed for coughing Flonase nasal spray 2 sprays each nostril daily for nasal congestion/drainage   Follow up in 2 weeks with Dr. Thora Lance or Philis Nettle. If symptoms do not improve or worsen, please contact office for sooner follow up or seek emergency care.    Acute sinusitis Likely contributing to cough. Target postnasal drainage with intranasal steroid. Empiric doxycycline 7 day course.    I spent 35 minutes of dedicated to the care of this patient on the date of this encounter to include pre-visit review of records, face-to-face time with the patient discussing conditions above, post visit ordering of testing, clinical documentation with  the electronic health record, making appropriate referrals as documented, and communicating necessary findings to members of the patients care team.  [name], NP 11/19/2022  Pt aware and understands NP's role.

## 2022-11-19 NOTE — Assessment & Plan Note (Signed)
Likely contributing to cough. Target postnasal drainage with intranasal steroid. Empiric doxycycline 7 day course.

## 2022-11-19 NOTE — Patient Instructions (Addendum)
Continue Albuterol inhaler 2 puffs or 3 mL neb every 6 hours as needed for shortness of breath or wheezing. Use nebs 2-4 times a day until symptoms improve. Follow with flutter valve Continue Breztri 2 puffs Twice daily. Brush tongue and rinse mouth afterwards  Continue guaifenesin 1200 mg Twice daily for chest congestion/cough  Doxycycline 1 tab Twice daily for 7 days. Take with food. Wear sunscreen when outside and avoid excessive sun exposure  Prednisone taper. 4 tabs for 4 days, then 3 tabs for 4 days, 2 tabs for 4 days, then 1 tab for 4 days, then stop. Take in AM with food. Start tomorrow  Benzonatate 1 capsule Three times a day for cough. Use consistently over the next few days to help calm your airways then as needed Delsym 2 tsp Twice daily as needed for coughing Flonase nasal spray 2 sprays each nostril daily for nasal congestion/drainage   Follow up in 2 weeks with Dr. Thora Lance or Philis Nettle. If symptoms do not improve or worsen, please contact office for sooner follow up or seek emergency care.

## 2022-11-20 ENCOUNTER — Other Ambulatory Visit: Payer: Self-pay

## 2022-11-26 DIAGNOSIS — J441 Chronic obstructive pulmonary disease with (acute) exacerbation: Secondary | ICD-10-CM | POA: Diagnosis not present

## 2022-12-03 ENCOUNTER — Encounter: Payer: Self-pay | Admitting: *Deleted

## 2022-12-04 ENCOUNTER — Encounter: Payer: Self-pay | Admitting: Nurse Practitioner

## 2022-12-04 ENCOUNTER — Ambulatory Visit (INDEPENDENT_AMBULATORY_CARE_PROVIDER_SITE_OTHER): Payer: BC Managed Care – PPO | Admitting: Nurse Practitioner

## 2022-12-04 ENCOUNTER — Ambulatory Visit (HOSPITAL_COMMUNITY): Payer: BC Managed Care – PPO

## 2022-12-04 ENCOUNTER — Observation Stay (HOSPITAL_BASED_OUTPATIENT_CLINIC_OR_DEPARTMENT_OTHER)
Admission: EM | Admit: 2022-12-04 | Discharge: 2022-12-05 | Disposition: A | Discharge status: Home / Self Care | Payer: BC Managed Care – PPO | Attending: Internal Medicine | Admitting: Internal Medicine

## 2022-12-04 ENCOUNTER — Other Ambulatory Visit: Payer: Self-pay

## 2022-12-04 ENCOUNTER — Emergency Department (HOSPITAL_BASED_OUTPATIENT_CLINIC_OR_DEPARTMENT_OTHER): Payer: BC Managed Care – PPO

## 2022-12-04 ENCOUNTER — Encounter (HOSPITAL_BASED_OUTPATIENT_CLINIC_OR_DEPARTMENT_OTHER): Payer: Self-pay | Admitting: Emergency Medicine

## 2022-12-04 ENCOUNTER — Encounter: Payer: Self-pay | Admitting: *Deleted

## 2022-12-04 VITALS — BP 120/86 | HR 112 | Temp 98.4°F | Ht 65.0 in | Wt 170.2 lb

## 2022-12-04 DIAGNOSIS — I2699 Other pulmonary embolism without acute cor pulmonale: Secondary | ICD-10-CM | POA: Diagnosis present

## 2022-12-04 DIAGNOSIS — D72829 Elevated white blood cell count, unspecified: Secondary | ICD-10-CM | POA: Diagnosis not present

## 2022-12-04 DIAGNOSIS — J441 Chronic obstructive pulmonary disease with (acute) exacerbation: Secondary | ICD-10-CM

## 2022-12-04 DIAGNOSIS — J439 Emphysema, unspecified: Secondary | ICD-10-CM | POA: Diagnosis not present

## 2022-12-04 DIAGNOSIS — J449 Chronic obstructive pulmonary disease, unspecified: Secondary | ICD-10-CM | POA: Diagnosis not present

## 2022-12-04 DIAGNOSIS — I10 Essential (primary) hypertension: Secondary | ICD-10-CM | POA: Diagnosis present

## 2022-12-04 DIAGNOSIS — Z85118 Personal history of other malignant neoplasm of bronchus and lung: Secondary | ICD-10-CM | POA: Diagnosis not present

## 2022-12-04 DIAGNOSIS — I2693 Single subsegmental pulmonary embolism without acute cor pulmonale: Secondary | ICD-10-CM | POA: Diagnosis not present

## 2022-12-04 DIAGNOSIS — J302 Other seasonal allergic rhinitis: Secondary | ICD-10-CM | POA: Diagnosis not present

## 2022-12-04 DIAGNOSIS — R0609 Other forms of dyspnea: Secondary | ICD-10-CM

## 2022-12-04 DIAGNOSIS — E785 Hyperlipidemia, unspecified: Secondary | ICD-10-CM

## 2022-12-04 DIAGNOSIS — C3432 Malignant neoplasm of lower lobe, left bronchus or lung: Secondary | ICD-10-CM | POA: Diagnosis present

## 2022-12-04 DIAGNOSIS — Z79899 Other long term (current) drug therapy: Secondary | ICD-10-CM | POA: Insufficient documentation

## 2022-12-04 DIAGNOSIS — Z7982 Long term (current) use of aspirin: Secondary | ICD-10-CM | POA: Diagnosis not present

## 2022-12-04 DIAGNOSIS — R0602 Shortness of breath: Secondary | ICD-10-CM | POA: Diagnosis not present

## 2022-12-04 DIAGNOSIS — R051 Acute cough: Secondary | ICD-10-CM

## 2022-12-04 DIAGNOSIS — Z87891 Personal history of nicotine dependence: Secondary | ICD-10-CM | POA: Diagnosis not present

## 2022-12-04 DIAGNOSIS — J440 Chronic obstructive pulmonary disease with acute lower respiratory infection: Secondary | ICD-10-CM

## 2022-12-04 DIAGNOSIS — R7989 Other specified abnormal findings of blood chemistry: Secondary | ICD-10-CM

## 2022-12-04 DIAGNOSIS — R490 Dysphonia: Secondary | ICD-10-CM

## 2022-12-04 DIAGNOSIS — J209 Acute bronchitis, unspecified: Secondary | ICD-10-CM

## 2022-12-04 HISTORY — DX: Other pulmonary embolism without acute cor pulmonale: I26.99

## 2022-12-04 LAB — BASIC METABOLIC PANEL
Anion gap: 9 (ref 5–15)
BUN: 17 mg/dL (ref 6–23)
BUN: 17 mg/dL (ref 6–20)
CO2: 26 mEq/L (ref 19–32)
CO2: 23 mmol/L (ref 22–32)
Calcium: 8.6 mg/dL (ref 8.4–10.5)
Calcium: 8.5 mg/dL — ABNORMAL LOW (ref 8.9–10.3)
Chloride: 102 mEq/L (ref 96–112)
Chloride: 102 mmol/L (ref 98–111)
Creatinine, Ser: 1.01 mg/dL (ref 0.40–1.50)
Creatinine, Ser: 1 mg/dL (ref 0.61–1.24)
GFR, Estimated: >60 mL/min (ref >60)
GFR: 83.79 mL/min (ref >60.00)
Glucose, Bld: 110 mg/dL — ABNORMAL HIGH (ref 70–99)
Glucose, Bld: 92 mg/dL (ref 70–99)
Potassium: 3.9 mEq/L (ref 3.5–5.1)
Potassium: 3.9 mmol/L (ref 3.5–5.1)
Sodium: 137 mEq/L (ref 135–145)
Sodium: 134 mmol/L — ABNORMAL LOW (ref 135–145)

## 2022-12-04 LAB — CBC
HCT: 38.2 % — ABNORMAL LOW (ref 39.0–52.0)
Hemoglobin: 12.9 g/dL — ABNORMAL LOW (ref 13.0–17.0)
MCH: 31.9 pg (ref 26.0–34.0)
MCHC: 33.8 g/dL (ref 30.0–36.0)
MCV: 94.6 fL (ref 80.0–100.0)
Platelets: 239 10*3/uL (ref 150–400)
RBC: 4.04 MIL/uL — ABNORMAL LOW (ref 4.22–5.81)
RDW: 21.2 % — ABNORMAL HIGH (ref 11.5–15.5)
WBC: 13.1 10*3/uL — ABNORMAL HIGH (ref 4.0–10.5)
nRBC: 0 % (ref 0.0–0.2)

## 2022-12-04 LAB — TROPONIN I (HIGH SENSITIVITY): Troponin I (High Sensitivity): 7 ng/L (ref <18)

## 2022-12-04 LAB — D-DIMER, QUANTITATIVE: D-Dimer, Quant: 0.79 mcg/mL FEU — ABNORMAL HIGH (ref <0.50)

## 2022-12-04 MED ORDER — HEPARIN BOLUS VIA INFUSION
5000.0000 [IU] | Freq: Once | INTRAVENOUS | Status: AC
  Administered 2022-12-04: 5000 [IU] via INTRAVENOUS

## 2022-12-04 MED ORDER — IOHEXOL 350 MG/ML SOLN
100.0000 mL | Freq: Once | INTRAVENOUS | Status: AC | PRN
  Administered 2022-12-04: 75 mL via INTRAVENOUS

## 2022-12-04 MED ORDER — HEPARIN (PORCINE) 25000 UT/250ML-% IV SOLN
1100.0000 [IU]/h | INTRAVENOUS | Status: DC
  Administered 2022-12-04: 1300 [IU]/h via INTRAVENOUS
  Filled 2022-12-04: qty 250

## 2022-12-04 NOTE — Progress Notes (Signed)
@Patient  ID: Brandon Phillips, male    DOB: 18-Feb-1966, 57 y.o.   MRN: [id number]  Chief Complaint  Patient presents with   Follow-up    Today is pt last day on steroids. Pt is feeling better. Is dealing with hoarse voice.     Referring provider: [name], NP  HPI: 57 year old male, former smoker followed for NSCLC of left lung and COPD. He is a patient of Dr. [name] and last seen in office 11/19/2022. Past medical history significant for HTN, allergic rhinitis.   TEST/EVENTS:  07/25/2022 LDCT chest: Atherosclerosis.  Spiculated solid central left lower lobe lung mass measuring 35 mm, new.  Solid peripheral right upper lobe pulmonary nodule measuring 7.9 mm. 08/14/2022 PET scan: Low-level hypermetabolism of the right upper lobe pulmonary nodule.  Hypermetabolism of the left lower lobe lung mass and ipsilateral hilar nodal metastasis. 08/26/2022 PFT: FVC 66, FEV1 42, ratio 48, TLC 122, DLCOcor 57.  No BD. 11/19/2022 CXR: stable left lower lobe mass. New right-sided chest wall port.   10/29/2022: OV with Dr. Thora Lance.  Undergoing radiation for stage IIb NSCLC left lower lobe, neoadjuvant chemotherapy with subsequent plan for restaging.  Resection potentially offered though he is hopeful to avoid surgery.  Does feel like his dyspnea has been worse since last visit.  He has had cough with yellow sputum production over the last 6 days.  He also increased pain with swallowing since starting XRT.  Treated for AE COPD with Z-Pak and prednisone taper.  Switched from Symbicort to [employer].  Advised to call if symptoms did not improve.  11/19/2022: OV with Keta Vanvalkenburgh NP for acute visit with his wife.  He was seen 4/2 and treated for a COPD with Z-Pak and prednisone taper.  He tells me that when he was on the prednisone, he was feeling a little bit better.  His wife was not noticing him wheeze.  Since he has finished these medications, he feels like he is more short of breath, has more chest congestion and wheezing and  is still producing yellow phlegm.  Cough tends to be worse at night and then in the morning when he first gets up.  He does have some nasal drainage.  Denies any fevers, night sweats, hemoptysis, lower extremity swelling, orthopnea.  No interim sick exposures.  Using [place] twice daily.  Does not have a neb machine at home. Eating and drinking well.   12/04/2022: Today - follow up Patient presents today with his wife for follow up. We treated him for unresolved acute bronchitis/AECOPD at his last visit with doxycycline course and prednisone taper. He had previously had good response to the steroids but flared back up once off. His CXR did not show an acute process. He was also recommended to start intranasal steroid for postnasal drainage and cough control measures.  Today, he tells me that he is some better but still having some chest congestion and cough. His cough is mostly dry but occasionally producing some white phlegm at times. Does have chest discomfort with the cough at times. His heart rate is up compared to his last visit. They haven't noticed much wheezing. Breathing feels better but still more short winded than his baseline. He is having some increased voice hoarseness. Denies fevers, chills, hemoptysis, leg swelling, calf pain, palpitations. He is using Clinical cytogeneticist. He's not sure if the Markus Daft is making him cough more and wonders if he should go back to his Symbicort. Doesn't notice an increase cough right after he  uses it. He is using flonase but he thinks this makes him drain more. Still using his neb treatments twice a day, which help some. Not using any cough suppressants as he was worried that he shouldn't be suppressing his cough.   No Known Allergies  Immunization History  Administered Date(s) Administered   PFIZER(Purple Top)SARS-COV-2 Vaccination 08/03/2020, 08/25/2020   Pneumococcal Polysaccharide-23 01/15/2018   Tdap 01/15/2018   Zoster Recombinat (Shingrix) 07/30/2019, 11/27/2019     Past Medical History:  Diagnosis Date   Cancer (HCC)    lung cancer   CAP (community acquired pneumonia)    COPD (chronic obstructive pulmonary disease) (HCC)    Eczema    HLD (hyperlipidemia)    Primary cancer of left lower lobe of lung (HCC) 09/17/2022   Tobacco use disorder 01/24/2017    Tobacco History: Social History   Tobacco Use  Smoking Status Former   Packs/day: 1.50   Years: 35.00   Additional pack years: 0.00   Total pack years: 52.50   Types: Cigarettes   Quit date: 03/30/2017   Years since quitting: 5.6  Smokeless Tobacco Never   Counseling given: Not Answered   Outpatient Medications Prior to Visit  Medication Sig Dispense Refill   albuterol (PROVENTIL) (2.5 MG/3ML) 0.083% nebulizer solution Take 3 mLs (2.5 mg total) by nebulization every 6 (six) hours as needed for wheezing or shortness of breath. 75 mL 5   albuterol (VENTOLIN HFA) 108 (90 Base) MCG/ACT inhaler Inhale 2 puffs into the lungs every 6 (six) hours as needed for wheezing. 2 each 11   aspirin EC 81 MG tablet Take 81 mg by mouth every evening. Swallow whole.     atorvastatin (LIPITOR) 20 MG tablet TAKE ONE TABLET BY MOUTH DAILY 90 tablet 3   benzonatate (TESSALON) 200 MG capsule Take 1 capsule (200 mg total) by mouth 3 (three) times daily as needed for cough. 30 capsule 1   Budeson-Glycopyrrol-Formoterol (BREZTRI AEROSPHERE) 160-9-4.8 MCG/ACT AERO Inhale 2 puffs into the lungs in the morning and at bedtime. 1 each 11   COENZYME Q10 PO Take 100 mg by mouth in the morning.     folic acid (FOLVITE) 1 MG tablet Take 1 tablet (1 mg total) by mouth daily. Start 7 days before pemetrexed chemotherapy. Continue until 21 days after pemetrexed completed. 100 tablet 3   GUAIFENESIN 1200 PO Take 1,200 mg by mouth in the morning and at bedtime.     predniSONE (DELTASONE) 10 MG tablet 4 tabs for 4 days, then 3 tabs for 4 days, 2 tabs for 4 days, then 1 tab for 4 days, then stop 40 tablet 0   telmisartan  (MICARDIS) 40 MG tablet Take 1 tablet (40 mg total) by mouth daily. 30 tablet 4   lidocaine-prilocaine (EMLA) cream Apply a dime size to port-a-cath 1-2 hours prior to access. Cover with saran wrap. (Patient not taking: Reported on 11/19/2022) 30 g 2   No facility-administered medications prior to visit.     Review of Systems:   Constitutional: No weight loss or gain, night sweats, fevers, chills, or lassitude. +fatigue  HEENT: No headaches, difficulty swallowing, tooth/dental problems, or sore throat. No sneezing, itching, ear ache. +nasal congestion, post nasal drip. +voice hoarseness  CV:  No chest pain, orthopnea, PND, swelling in lower extremities, anasarca, dizziness, palpitations, syncope Resp: +shortness of breath with exertion; cough; improved wheezing. No hemoptysis. No chest wall deformity GI:  No heartburn, indigestion, abdominal pain, nausea, vomiting, diarrhea, change in bowel habits, loss  of appetite, bloody stools.  GU: No dysuria, change in color of urine, urgency or frequency.  No flank pain, no hematuria  Skin: No rash, lesions, ulcerations MSK:  No joint pain or swelling.   Neuro: No dizziness or lightheadedness.  Psych: No depression or anxiety. Mood stable.     Physical Exam:  BP 120/86   Pulse (!) 112   Temp 98.4 F (36.9 C) (Oral)   Ht 5\' 5"  (1.651 m)   Wt 170 lb 3.2 oz (77.2 kg)   SpO2 97%   BMI 28.32 kg/m   GEN: Pleasant, interactive, well-appearing; in no acute distress. HEENT:  Normocephalic and atraumatic. PERRLA. Sclera white. Nasal turbinates pink, moist and patent bilaterally. No rhinorrhea present. Oropharynx pink and moist, without exudate or edema. No lesions, ulcerations, or postnasal drip.  NECK:  Supple w/ fair ROM. No JVD present. Normal carotid impulses w/o bruits. Thyroid symmetrical with no goiter or nodules palpated. No lymphadenopathy.   CV: Tachycardic, regular rhythm, no m/r/g, no peripheral edema. Pulses intact, +2 bilaterally. No  cyanosis, pallor or clubbing. PULMONARY:  Unlabored, regular breathing. Clear bilaterally A&P w/o wheezes/rales/rhonchi. Dry cough. No accessory muscle use.  GI: BS present and normoactive. Soft, non-tender to palpation. No organomegaly or masses detected.  MSK: No erythema, warmth or tenderness. Cap refil <2 sec all extrem. No deformities or joint swelling noted.  Neuro: A/Ox3. No focal deficits noted.   Skin: Warm, no lesions or rashe Psych: Normal affect and behavior. Judgement and thought content appropriate.     Lab Results:  CBC    Component Value Date/Time   WBC 10.5 11/04/2022 0805   WBC 9.4 01/11/2022 0000   RBC 4.21 (L) 11/04/2022 0805   HGB 12.7 (L) 11/04/2022 0805   HCT 36.4 (L) 11/04/2022 0805   PLT 322 11/04/2022 0805   MCV 86.5 11/04/2022 0805   MCH 30.2 11/04/2022 0805   MCHC 34.9 11/04/2022 0805   RDW 15.9 (H) 11/04/2022 0805   LYMPHSABS 0.6 (L) 11/04/2022 0805   MONOABS 1.5 (H) 11/04/2022 0805   EOSABS 0.0 11/04/2022 0805   BASOSABS 0.0 11/04/2022 0805    BMET    Component Value Date/Time   NA 136 11/04/2022 0805   K 3.6 11/04/2022 0805   CL 100 11/04/2022 0805   CO2 28 11/04/2022 0805   GLUCOSE 128 (H) 11/04/2022 0805   BUN 18 11/04/2022 0805   CREATININE 0.94 11/04/2022 0805   CREATININE 1.05 01/11/2022 0000   CALCIUM 9.2 11/04/2022 0805   GFRNONAA >60 11/04/2022 0805   GFRNONAA 88 04/06/2020 0708   GFRAA 102 04/06/2020 0708    BNP No results found for: "BNP"   Imaging:  DG Chest 2 View  Result Date: 11/19/2022 CLINICAL DATA:  Productive cough EXAM: CHEST - 2 VIEW COMPARISON:  08/29/2022 FINDINGS: Cardiac shadow is within normal limits. New right chest wall port is noted in satisfactory position. Fiducial marker is again noted in the right upper and left lower lobes. Persistent left lower lobe mass lesion is noted posteriorly. No infiltrate or effusion is seen. No bony abnormality is noted. IMPRESSION: Stable left lower lobe mass. New  right-sided chest wall port. No acute abnormality noted. Electronically Signed   By: Alcide Clever M.D.   On: 11/19/2022 10:39    albuterol (PROVENTIL) (2.5 MG/3ML) 0.083% nebulizer solution 2.5 mg     Date Action Dose Route User   11/19/2022 1055 Given 2.5 mg Nebulization Pinion, Emily P, CMA      CARBOplatin (  PARAPLATIN) 570.5 mg in sodium chloride 0.9 % 250 mL chemo infusion     Date Action Dose Route User   10/14/2022 1158 Rate/Dose Change (none) Intravenous Margaretha Seeds, RN   10/14/2022 1158 Rate/Dose Change (none) Intravenous Margaretha Seeds, RN   10/14/2022 1128 Rate/Dose Change (none) Intravenous Margaretha Seeds, RN   10/14/2022 1128 New Bag/Given 570.5 mg Intravenous Margaretha Seeds, RN      CARBOplatin (PARAPLATIN) 620 mg in sodium chloride 0.9 % 250 mL chemo infusion     Date Action Dose Route User   11/04/2022 1049 New Bag/Given 620 mg Intravenous Lenn Sink I, RN      cyanocobalamin (VITAMIN B12) injection 1,000 mcg     Date Action Dose Route User   11/04/2022 1123 Given 1,000 mcg Intramuscular (Left Deltoid) Lenn Sink I, RN      dexamethasone (DECADRON) 10 mg in sodium chloride 0.9 % 50 mL IVPB     Date Action Dose Route User   10/14/2022 1018 Infusion Verify (none) Intravenous Margaretha Seeds, RN   10/14/2022 1002 Rate/Dose Change (none) Intravenous Margaretha Seeds, RN   10/14/2022 1002 New Bag/Given 10 mg Intravenous Margaretha Seeds, RN      fosaprepitant (EMEND) 150 mg in sodium chloride 0.9 % 145 mL IVPB     Date Action Dose Route User   10/14/2022 1019 Infusion Verify (none) Intravenous Margaretha Seeds, RN   10/14/2022 1004 Rate/Dose Change (none) Intravenous Margaretha Seeds, RN   10/14/2022 1004 New Bag/Given 150 mg Intravenous Margaretha Seeds, RN      fosaprepitant (EMEND) 150 mg in sodium chloride 0.9 % 145 mL IVPB     Date Action Dose Route User   11/04/2022 0918 New Bag/Given 150 mg Intravenous Lenn Sink I, RN      heparin lock  flush 100 unit/mL     Date Action Dose Route User   10/14/2022 1202 Given 500 Units Intracatheter Margaretha Seeds, RN      heparin lock flush 100 unit/mL     Date Action Dose Route User   11/04/2022 1123 Given 500 Units Intracatheter Zalma, [place] I, RN      methylPREDNISolone acetate (DEPO-MEDROL) injection 80 mg     Date Action Dose Route User   11/19/2022 1054 Given 80 mg Intramuscular (Left Upper Outer Quadrant) Pinion, Farley Ly, CMA      palonosetron (ALOXI) injection 0.25 mg     Date Action Dose Route User   10/14/2022 0956 Given 0.25 mg Intravenous Margaretha Seeds, RN      palonosetron (ALOXI) injection 0.25 mg     Date Action Dose Route User   11/04/2022 0913 Given 0.25 mg Intravenous Lenn Sink I, RN      PEMEtrexed (ALIMTA) 1,000 mg in sodium chloride 0.9 % 100 mL chemo infusion     Date Action Dose Route User   10/14/2022 1109 Rate/Dose Change (none) Intravenous Margaretha Seeds, RN   10/14/2022 1108 Rate/Dose Change (none) Intravenous Margaretha Seeds, RN   10/14/2022 1057 Infusion Verify (none) Intravenous Margaretha Seeds, RN   10/14/2022 1057 New Bag/Given 1,000 mg Intravenous Margaretha Seeds, RN      PEMEtrexed (ALIMTA) 1,000 mg in sodium chloride 0.9 % 100 mL chemo infusion     Date Action Dose Route User   11/04/2022 1029 New Bag/Given 1,000 mg Intravenous Lenn Sink I, RN      0.9 %  sodium chloride infusion  Date Action Dose Route User   10/14/2022 1203 Rate/Dose Change (none) Intravenous Margaretha Seeds, RN   10/14/2022 1159 Rate/Dose Change (none) Intravenous Margaretha Seeds, RN   10/14/2022 1115 Infusion Verify (none) Intravenous Margaretha Seeds, RN   10/14/2022 1115 Rate/Dose Change (none) Intravenous Margaretha Seeds, RN   10/14/2022 1109 Rate/Dose Change (none) Intravenous Margaretha Seeds, RN      0.9 %  sodium chloride infusion     Date Action Dose Route User   11/04/2022 0912 New Bag/Given (none) Intravenous Lenn Sink I, [place]       sodium chloride flush (NS) 0.9 % injection 10 mL     Date Action Dose Route User   10/14/2022 1202 Given 10 mL Intracatheter Margaretha Seeds, RN      sodium chloride flush (NS) 0.9 % injection 10 mL     Date Action Dose Route User   11/04/2022 1123 Given 10 mL Intracatheter Paulina, Misty Stanley I, RN          Latest Ref Rng & Units 08/26/2022    3:25 PM  PFT Results  FVC-Pre L 2.75   FVC-Predicted Pre % 66   FVC-Post L 2.77   FVC-Predicted Post % 67   Pre FEV1/FVC % % 48   Post FEV1/FCV % % 48   FEV1-Pre L 1.33   FEV1-Predicted Pre % 42   FEV1-Post L 1.33   DLCO uncorrected ml/min/mmHg 15.07   DLCO UNC% % 61   DLCO corrected ml/min/mmHg 14.04   DLCO COR %Predicted % 57   DLVA Predicted % 60   TLC L 7.30   TLC % Predicted % 122   RV % Predicted % 235     No results found for: "NITRICOXIDE"      Assessment & Plan:   Acute bronchitis with COPD (HCC) Slow to resolve. Previous CXR without acute process. He has been covered from an antimicrobial standpoint with azithromycin and doxycycline. He completed prednisone course today. Lung exam without bronchospasm so hold off on further steroids. Recommended he target cough and postnasal drainage control to limit further inflammation/irritation. Unclear if there is a correlation between step up to [place]; not typical response but could try changing him back to Symbicort if symptoms persist to see if he has any improvement. He does have new onset tachycardia today and given his persistent symptoms and hx of malignancy, we will obtain d dimer to determine if he needs CTA chest to rule out PE. Provided with strict return/ED precautions. Close follow up.  Patient Instructions  Continue Albuterol inhaler 2 puffs or 3 mL neb every 6 hours as needed for shortness of breath or wheezing. Use nebs 2-4 times a day until symptoms improve. Follow with flutter valve Continue Breztri 2 puffs Twice daily. Brush tongue and rinse mouth afterwards   Continue guaifenesin 1200 mg Twice daily for chest congestion/cough Continue Flonase nasal spray 2 sprays each nostril daily for nasal congestion/drainage   -Benzonatate 1 capsule Three times a day for cough. Use consistently over the next few days to help calm your airways then as needed -Delsym 2 tsp Twice daily for cough - use consistently over the next few days to calm the cough then as needed  -Start over the counter allergy pill such as claritin, zyrtec or xyzal. Can use store brand version -Use chlorpheniramine 4 mg tab At bedtime for drainage/cough -Complete prednisone taper as previously prescribed -Try to avoid forceful coughing   Labs today - I will  call you if we need to do a CT scan   Follow up in 4 weeks with Dr. Thora Lance or Florentina Addison Nayellie Sanseverino,NP. If symptoms do not improve or worsen, please contact office for sooner follow up or seek emergency care.    Seasonal allergic rhinitis Add on daily antihistamine. Continue intranasal steroid as needed.   Primary cancer of left lower lobe of lung (HCC) S/p chemo/radiation. Insurance denied repeat PET scan. They are working to get this rescheduled.   Voice hoarseness Likely due to upper airway inflammation from cough/postnasal drainage. See above    I spent 35 minutes of dedicated to the care of this patient on the date of this encounter to include pre-visit review of records, face-to-face time with the patient discussing conditions above, post visit ordering of testing, clinical documentation with the electronic health record, making appropriate referrals as documented, and communicating necessary findings to members of the patients care team.  [name], NP 12/04/2022  Pt aware and understands NP's role.

## 2022-12-04 NOTE — Progress Notes (Addendum)
12/04/2022 Addendum: Positive d dimer. STAT CTA chest PE protocol ordered for further evaluation. Unable to get him scheduled at any local location for imaging. Pt advised to go to the ED to rule out PE. Pt notified and verbalized understanding.   Pulmonary Embolism Wells Score Select Criteria:  Symptoms of DVT (3 points)  No alternative diagnosis better explains the illness (3 points)  Tachycardia with pulse > 100 (1.5 points)  Immobilization (>= 3 days) or surgery in the previous four weeks (1.5 points)  Prior history of DVT or pulmonary embolism (1.5 points)  Presence of hemoptysis (1 point)  Presence of malignancy (1 point) Results: Total Criteria Point Count:   2.5 - moderate probability   Pulmonary Embolism Risk Score Interpretation Score > 6: High probability Score >= 2 and <= 6: Moderate probability Score < 2: Low Probability

## 2022-12-04 NOTE — Progress Notes (Signed)
Patient's PET was deemed not medically necessary because 12 weeks time had not elapsed since the end of his radiation. Reviewed with Dr Myna Hidalgo who states to push PET out to 12 weeks or around July 8th. Will schedule closer to that date.   Oncology Nurse Navigator Documentation     12/04/2022    2:15 PM  Oncology Nurse Navigator Flowsheets  Navigator Follow Up Date: 12/26/2022  Navigator Follow Up Reason: Follow-up Appointment  Navigator Location [facility]  Navigator Encounter Type MyChart  Patient Visit Type MedOnc  Treatment Phase Active Tx  Barriers/Navigation Needs Coordination of Care;Education  Education Other  Interventions Coordination of Care;Education  Acuity Level 2-Minimal Needs (1-2 Barriers Identified)  Coordination of Care Radiology  Education Method Written  Support Groups/Services Friends and Family  Time Spent with Patient 15

## 2022-12-04 NOTE — Assessment & Plan Note (Signed)
Likely due to upper airway inflammation from cough/postnasal drainage. See above

## 2022-12-04 NOTE — Addendum Note (Signed)
Addended by: [name] on: 12/04/2022 04:58 PM   Modules accepted: Orders

## 2022-12-04 NOTE — ED Provider Notes (Signed)
[place] EMERGENCY DEPARTMENT AT [facility] Provider Note   CSN: [id number] Arrival date & time: 12/04/22  1801     History  Chief Complaint  Patient presents with   Shortness of Breath    Brandon Phillips is a 57 y.o. male with medical history of non-small cell lung cancer of left lower lobe, tobacco use disorder, COPD, eczema, hyperlipidemia.  The patient presents to the ED for evaluation of shortness of breath, elevated D-dimer.  The patient has been having COPD exacerbation for the last 1 month.  The patient has been placed on doxycycline, azithromycin Z-Pak, steroids.  Patient had his last dose of steroids today and he had an office visit follow-up with pulmonology.  At this office visit they drew a D-dimer which was elevated.  The patient was sent here for PE rule out.  The patient is endorsing shortness of breath, hoarse voice, chest pain.  He denies any nausea, vomiting, abdominal pain, back pain, lightheadedness or dizziness.  He is currently undergoing chemotherapy with the cancer center.   Shortness of Breath Associated symptoms: chest pain        Home Medications Prior to Admission medications   Medication Sig Start Date End Date Taking? Authorizing Provider  albuterol (PROVENTIL) (2.5 MG/3ML) 0.083% nebulizer solution Take 3 mLs (2.5 mg total) by nebulization every 6 (six) hours as needed for wheezing or shortness of breath. 11/19/22   Cobb, Ruby Cola, NP  albuterol (VENTOLIN HFA) 108 (90 Base) MCG/ACT inhaler Inhale 2 puffs into the lungs every 6 (six) hours as needed for wheezing. 01/04/22   [name], NP  aspirin EC 81 MG tablet Take 81 mg by mouth every evening. Swallow whole.    [provider]  atorvastatin (LIPITOR) 20 MG tablet TAKE ONE TABLET BY MOUTH DAILY 07/08/22   [name], NP  benzonatate (TESSALON) 200 MG capsule Take 1 capsule (200 mg total) by mouth 3 (three) times daily as needed for cough. 11/19/22   Cobb, Ruby Cola, NP   Budeson-Glycopyrrol-Formoterol (BREZTRI AEROSPHERE) 160-9-4.8 MCG/ACT AERO Inhale 2 puffs into the lungs in the morning and at bedtime. 10/29/22   [name], MD  COENZYME Q10 PO Take 100 mg by mouth in the morning.    [provider]  folic acid (FOLVITE) 1 MG tablet Take 1 tablet (1 mg total) by mouth daily. Start 7 days before pemetrexed chemotherapy. Continue until 21 days after pemetrexed completed. 09/17/22   [name], MD  GUAIFENESIN 1200 PO Take 1,200 mg by mouth in the morning and at bedtime.    [provider]  lidocaine-prilocaine (EMLA) cream Apply a dime size to port-a-cath 1-2 hours prior to access. Cover with saran wrap. Patient not taking: Reported on 11/19/2022 09/23/22   [name], MD  predniSONE (DELTASONE) 10 MG tablet 4 tabs for 4 days, then 3 tabs for 4 days, 2 tabs for 4 days, then 1 tab for 4 days, then stop 11/19/22   Cobb, Ruby Cola, NP  telmisartan (MICARDIS) 40 MG tablet Take 1 tablet (40 mg total) by mouth daily. 10/14/22   [name], MD      Allergies    Patient has no known allergies.    Review of Systems   Review of Systems  Respiratory:  Positive for shortness of breath.   Cardiovascular:  Positive for chest pain.  All other systems reviewed and are negative.   Physical Exam Updated Vital Signs BP (!) 134/91   Pulse 84  Temp 98.5 F (36.9 C) (Oral)   Resp 20   SpO2 97%  Physical Exam Vitals and nursing note reviewed.  Constitutional:      General: He is not in acute distress.    Appearance: Normal appearance. He is not ill-appearing, toxic-appearing or diaphoretic.  HENT:     Head: Normocephalic and atraumatic.     Nose: Nose normal.     Mouth/Throat:     Mouth: Mucous membranes are moist.     Pharynx: Oropharynx is clear.  Eyes:     Extraocular Movements: Extraocular movements intact.     Conjunctiva/sclera: Conjunctivae normal.     Pupils: Pupils are equal, round, and reactive to light.   Cardiovascular:     Rate and Rhythm: Normal rate and regular rhythm.  Pulmonary:     Effort: Pulmonary effort is normal.     Breath sounds: Normal breath sounds. No wheezing.  Abdominal:     General: Abdomen is flat. Bowel sounds are normal.     Palpations: Abdomen is soft.     Tenderness: There is no abdominal tenderness.  Musculoskeletal:     Cervical back: Normal range of motion and neck supple. No tenderness.     Right lower leg: No edema.     Left lower leg: No edema.  Skin:    General: Skin is warm and dry.     Capillary Refill: Capillary refill takes less than 2 seconds.  Neurological:     Mental Status: He is alert and oriented to person, place, and time.     ED Results / Procedures / Treatments   Labs (all labs ordered are listed, but only abnormal results are displayed) Labs Reviewed  CBC - Abnormal; Notable for the following components:      Result Value   WBC 13.1 (*)    RBC 4.04 (*)    Hemoglobin 12.9 (*)    HCT 38.2 (*)    RDW 21.2 (*)    All other components within normal limits  BASIC METABOLIC PANEL - Abnormal; Notable for the following components:   Sodium 134 (*)    Calcium 8.5 (*)    All other components within normal limits  HEPARIN LEVEL (UNFRACTIONATED)  CBC  APTT  TROPONIN I (HIGH SENSITIVITY)    EKG None  Radiology CT Angio Chest PE W and/or Wo Contrast  Result Date: 12/04/2022 CLINICAL DATA:  Concern for pulmonary embolism. EXAM: CT ANGIOGRAPHY CHEST WITH CONTRAST TECHNIQUE: Multidetector CT imaging of the chest was performed using the standard protocol during bolus administration of intravenous contrast. Multiplanar CT image reconstructions and MIPs were obtained to evaluate the vascular anatomy. RADIATION DOSE REDUCTION: This exam was performed according to the departmental dose-optimization program which includes automated exposure control, adjustment of the mA and/or kV according to patient size and/or use of iterative reconstruction  technique. CONTRAST:  75mL OMNIPAQUE IOHEXOL 350 MG/ML SOLN COMPARISON:  Chest CT dated 07/25/2022 and radiograph dated 11/19/2022. FINDINGS: Cardiovascular: There is no cardiomegaly or pericardial effusion. There is coronary vascular calcification. The thoracic aorta is unremarkable. Right-sided Port-A-Cath with tip in the right atrium close to the cavoatrial junction. There is a small subsegmental right lower lobe pulmonary artery embolus. No CT evidence of right heart straining. Mediastinum/Nodes: Left lower lobe/posterior perihilar mass measures approximately 2.2 x 1.8 cm (previously 4.1 x 2.7 cm on CT of 07/25/2022). No mediastinal adenopathy. The esophagus is grossly unremarkable. No mediastinal fluid collection. Lungs/Pleura: Left lower lobe mass described above. No new consolidation. There  is no pleural effusion or pneumothorax. There is background of emphysema. Upper Abdomen: No acute abnormality. Musculoskeletal: No acute findings. Review of the MIP images confirms the above findings. IMPRESSION: 1. Small subsegmental right lower lobe pulmonary artery embolus. No CT evidence of right heart straining. 2. Interval decrease in the size of the left lower lobe/posterior perihilar mass. These results were called by telephone at the time of interpretation on 12/04/2022 at 8:37 pm to provider [facility] , who verbally acknowledged these results. Electronically Signed   By: Elgie Collard M.D.   On: 12/04/2022 20:37    Procedures Procedures   Medications Ordered in ED Medications  heparin ADULT infusion 100 units/mL (25000 units/243mL) (1,300 Units/hr Intravenous New Bag/Given 12/04/22 2300)  iohexol (OMNIPAQUE) 350 MG/ML injection 100 mL (75 mLs Intravenous Contrast Given 12/04/22 1958)  heparin bolus via infusion 5,000 Units (5,000 Units Intravenous Bolus from Bag 12/04/22 2300)    ED Course/ Medical Decision Making/ A&P  Medical Decision Making Amount and/or Complexity of Data Reviewed Labs:  ordered. Radiology: ordered.   57 year old no presents to the ED for evaluation.  Please see HPI for further details.  On examination patient is afebrile and nontachycardic.  Lung sounds clear bilaterally, he is not hypoxic.  His abdomen is soft and compressible throughout.  Neurological examination without focal neurodeficits.  D-dimer elevated earlier at pulmonology office.  Will proceed with CT angiogram of patient chest to assess for PE.  Will also draw CBC, BMP and troponin to assess for right heart strain.  EKG will also be assessed.  CBC with leukocytosis 13.1, hemoglobin 12.9.  BMP with sodium 134, no other electrolyte derangement, no elevated creatinine.   CT angiogram shows right lower lobe subsegmental PE with no evidence of right heart strain. Patient troponin 7.  Will draw APTT, heparin level.  Will start patient on heparin at this time per pharmacy consult.  Will admit patient.  Discussed with Dr. Julian Reil of [facility] hospitalist who will admit the patient.  Final Clinical Impression(s) / ED Diagnoses Final diagnoses:  Single subsegmental pulmonary embolism without acute cor pulmonale [facility])    Rx / DC Orders ED Discharge Orders     None         Clent Ridges 12/04/22 2322    Alvira Monday, MD 12/05/22 [phone]

## 2022-12-04 NOTE — Progress Notes (Signed)
ANTICOAGULATION CONSULT NOTE - Initial Consult  Pharmacy Consult for heparin Indication: pulmonary embolus  No Known Allergies  Patient Measurements:   Heparin Dosing Weight: 77 kg  Vital Signs: Temp: 98.5 F (36.9 C) (05/08 2205) Temp Source: Oral (05/08 2205) BP: 123/86 (05/08 2030) Pulse Rate: 88 (05/08 2030)  Labs: Recent Labs    12/04/22 1458 12/04/22 1825  HGB  --  12.9*  HCT  --  38.2*  PLT  --  239  CREATININE 1.01 1.00    Estimated Creatinine Clearance: 80 mL/min (by C-G formula based on SCr of 1 mg/dL).   Medical History: Past Medical History:  Diagnosis Date   Cancer (HCC)    lung cancer   CAP (community acquired pneumonia)    COPD (chronic obstructive pulmonary disease) (HCC)    Eczema    HLD (hyperlipidemia)    Primary cancer of left lower lobe of lung (HCC) 09/17/2022   Tobacco use disorder 01/24/2017    Medications:  (Not in a hospital admission)  Scheduled:   heparin  5,000 Units Intravenous Once   Infusions:   heparin      Assessment: Presented with SOB  1 month. CTA showed small subsegmental RLL PA embolus with no evidence of RHS. Hgb 12.9 and PLTc 239K. Ddimer elevated at 0.79. Vitals stable. No anticoagulation PTA  Goal of Therapy:  Heparin level 0.3-0.7 units/ml Monitor platelets by anticoagulation protocol: Yes   Plan:  Give heparin 5000 unit bolus Start heparin at 1300 units/hr Daily heparin level and CBC Heparin level in 6 hours  Thank you for involving pharmacy in this patient's care.  Enos Fling, PharmD PGY2 Pharmacy Resident 12/04/2022 10:37 PM

## 2022-12-04 NOTE — Patient Instructions (Addendum)
Continue Albuterol inhaler 2 puffs or 3 mL neb every 6 hours as needed for shortness of breath or wheezing. Use nebs 2-4 times a day until symptoms improve. Follow with flutter valve Continue Breztri 2 puffs Twice daily. Brush tongue and rinse mouth afterwards  Continue guaifenesin 1200 mg Twice daily for chest congestion/cough Continue Flonase nasal spray 2 sprays each nostril daily for nasal congestion/drainage   -Benzonatate 1 capsule Three times a day for cough. Use consistently over the next few days to help calm your airways then as needed -Delsym 2 tsp Twice daily for cough - use consistently over the next few days to calm the cough then as needed  -Start over the counter allergy pill such as claritin, zyrtec or xyzal. Can use store brand version -Use chlorpheniramine 4 mg tab At bedtime for drainage/cough -Complete prednisone taper as previously prescribed -Try to avoid forceful coughing   Labs today - I will call you if we need to do a CT scan   Follow up in 4 weeks with Dr. Thora Lance or Florentina Addison Wenceslaus Gist,NP. If symptoms do not improve or worsen, please contact office for sooner follow up or seek emergency care.

## 2022-12-04 NOTE — ED Triage Notes (Signed)
Pt arrives to ED with c/o SOB and cough x1 month. Hx lung cancer and COPD. Saw pulmonology today who drew a d-dimer due to persistent cough and new onset tachycardia post acute bronchitis. D-dimer was positive and pt sent to ED to r/o PE.

## 2022-12-04 NOTE — Assessment & Plan Note (Signed)
Add on daily antihistamine. Continue intranasal steroid as needed.

## 2022-12-04 NOTE — Assessment & Plan Note (Signed)
Slow to resolve. Previous CXR without acute process. He has been covered from an antimicrobial standpoint with azithromycin and doxycycline. He completed prednisone course today. Lung exam without bronchospasm so hold off on further steroids. Recommended he target cough and postnasal drainage control to limit further inflammation/irritation. Unclear if there is a correlation between step up to [place]; not typical response but could try changing him back to Symbicort if symptoms persist to see if he has any improvement. He does have new onset tachycardia today and given his persistent symptoms and hx of malignancy, we will obtain d dimer to determine if he needs CTA chest to rule out PE. Provided with strict return/ED precautions. Close follow up.  Patient Instructions  Continue Albuterol inhaler 2 puffs or 3 mL neb every 6 hours as needed for shortness of breath or wheezing. Use nebs 2-4 times a day until symptoms improve. Follow with flutter valve Continue Breztri 2 puffs Twice daily. Brush tongue and rinse mouth afterwards  Continue guaifenesin 1200 mg Twice daily for chest congestion/cough Continue Flonase nasal spray 2 sprays each nostril daily for nasal congestion/drainage   -Benzonatate 1 capsule Three times a day for cough. Use consistently over the next few days to help calm your airways then as needed -Delsym 2 tsp Twice daily for cough - use consistently over the next few days to calm the cough then as needed  -Start over the counter allergy pill such as claritin, zyrtec or xyzal. Can use store brand version -Use chlorpheniramine 4 mg tab At bedtime for drainage/cough -Complete prednisone taper as previously prescribed -Try to avoid forceful coughing   Labs today - I will call you if we need to do a CT scan   Follow up in 4 weeks with Dr. Thora Lance or Florentina Addison Eliabeth Shoff,NP. If symptoms do not improve or worsen, please contact office for sooner follow up or seek emergency care.

## 2022-12-04 NOTE — Assessment & Plan Note (Signed)
S/p chemo/radiation. Insurance denied repeat PET scan. They are working to get this rescheduled.

## 2022-12-05 ENCOUNTER — Encounter: Payer: Self-pay | Admitting: Hematology & Oncology

## 2022-12-05 ENCOUNTER — Other Ambulatory Visit: Payer: BC Managed Care – PPO

## 2022-12-05 ENCOUNTER — Telehealth (HOSPITAL_COMMUNITY): Payer: Self-pay

## 2022-12-05 ENCOUNTER — Other Ambulatory Visit (HOSPITAL_COMMUNITY): Payer: Self-pay

## 2022-12-05 ENCOUNTER — Observation Stay (HOSPITAL_COMMUNITY): Payer: BC Managed Care – PPO

## 2022-12-05 DIAGNOSIS — J449 Chronic obstructive pulmonary disease, unspecified: Secondary | ICD-10-CM

## 2022-12-05 DIAGNOSIS — E785 Hyperlipidemia, unspecified: Secondary | ICD-10-CM

## 2022-12-05 DIAGNOSIS — I2699 Other pulmonary embolism without acute cor pulmonale: Secondary | ICD-10-CM | POA: Diagnosis not present

## 2022-12-05 DIAGNOSIS — J441 Chronic obstructive pulmonary disease with (acute) exacerbation: Secondary | ICD-10-CM

## 2022-12-05 LAB — HIV ANTIBODY (ROUTINE TESTING W REFLEX): HIV Screen 4th Generation wRfx: NONREACTIVE

## 2022-12-05 LAB — CBC
HCT: 35.4 % — ABNORMAL LOW (ref 39.0–52.0)
Hemoglobin: 11.7 g/dL — ABNORMAL LOW (ref 13.0–17.0)
MCH: 31.5 pg (ref 26.0–34.0)
MCHC: 33.1 g/dL (ref 30.0–36.0)
MCV: 95.4 fL (ref 80.0–100.0)
Platelets: 192 10*3/uL (ref 150–400)
RBC: 3.71 MIL/uL — ABNORMAL LOW (ref 4.22–5.81)
RDW: 21.3 % — ABNORMAL HIGH (ref 11.5–15.5)
WBC: 9.4 10*3/uL (ref 4.0–10.5)
nRBC: 0 % (ref 0.0–0.2)

## 2022-12-05 LAB — HEPARIN LEVEL (UNFRACTIONATED): Heparin Unfractionated: 1.08 IU/mL — ABNORMAL HIGH (ref 0.30–0.70)

## 2022-12-05 MED ORDER — UMECLIDINIUM BROMIDE 62.5 MCG/ACT IN AEPB
1.0000 | INHALATION_SPRAY | Freq: Every day | RESPIRATORY_TRACT | Status: DC
  Administered 2022-12-05: 1 via RESPIRATORY_TRACT
  Filled 2022-12-05: qty 7

## 2022-12-05 MED ORDER — APIXABAN 5 MG PO TABS
10.0000 mg | ORAL_TABLET | Freq: Two times a day (BID) | ORAL | Status: DC
  Administered 2022-12-05: 10 mg via ORAL
  Filled 2022-12-05: qty 2

## 2022-12-05 MED ORDER — ALBUTEROL SULFATE (2.5 MG/3ML) 0.083% IN NEBU: 3.0000 mL | INHALATION_SOLUTION | Freq: Four times a day (QID) | RESPIRATORY_TRACT | Status: DC | PRN

## 2022-12-05 MED ORDER — BUDESON-GLYCOPYRROL-FORMOTEROL 160-9-4.8 MCG/ACT IN AERO: 2.0000 | INHALATION_SPRAY | Freq: Two times a day (BID) | RESPIRATORY_TRACT | Status: DC

## 2022-12-05 MED ORDER — APIXABAN 5 MG PO TABS: 5.0000 mg | ORAL_TABLET | Freq: Two times a day (BID) | ORAL | Status: DC

## 2022-12-05 MED ORDER — HEPARIN SOD (PORK) LOCK FLUSH 100 UNIT/ML IV SOLN
500.0000 [IU] | INTRAVENOUS | Status: AC | PRN
  Administered 2022-12-05: 500 [IU]

## 2022-12-05 MED ORDER — ACETAMINOPHEN 325 MG PO TABS: 650.0000 mg | ORAL_TABLET | Freq: Four times a day (QID) | ORAL | Status: DC | PRN

## 2022-12-05 MED ORDER — APIXABAN (ELIQUIS) VTE STARTER PACK (10MG AND 5MG)
ORAL_TABLET | ORAL | 0 refills | Status: DC
  Filled 2022-12-05: qty 74, 30d supply, fill #0

## 2022-12-05 MED ORDER — ACETAMINOPHEN 650 MG RE SUPP: 650.0000 mg | Freq: Four times a day (QID) | RECTAL | Status: DC | PRN

## 2022-12-05 MED ORDER — APIXABAN (ELIQUIS) VTE STARTER PACK (10MG AND 5MG): ORAL_TABLET | ORAL | 0 refills | Status: DC

## 2022-12-05 MED ORDER — FLUTICASONE FUROATE-VILANTEROL 200-25 MCG/ACT IN AEPB
1.0000 | INHALATION_SPRAY | Freq: Every day | RESPIRATORY_TRACT | Status: DC
  Administered 2022-12-05: 1 via RESPIRATORY_TRACT
  Filled 2022-12-05: qty 28

## 2022-12-05 NOTE — H&P (Signed)
History and Physical    Brandon Phillips BJY:[id number] DOB: Apr 09, 1966 DOA: 12/04/2022  PCP: [name], NP  Patient coming from: [facility] ED  Chief Complaint: Shortness of breath  HPI: Brandon Phillips is a 57 y.o. 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 Recently seen by pulmonology on 4/23 and treated for acute COPD exacerbation with a course of prednisone and antibiotics.  He had a follow-up visit with pulmonology yesterday and had D-dimer checked which came back positive and patient was sent to the ED for stat CTA chest to rule out PE.  In the ED, vital signs stable.  Labs showing WBC 13.1, hemoglobin 12.9 (stable), platelet count 239k, sodium 134, potassium 3.9, chloride 102, bicarb 23, BUN 17, creatinine 1.0, glucose 92, troponin negative.  CTA chest: "IMPRESSION: 1. Small subsegmental right lower lobe pulmonary artery embolus. No CT evidence of right heart straining. 2. Interval decrease in the size of the left lower lobe/posterior perihilar mass."  Patient was started on IV heparin in the ED and transferred to [facility].  History per patient: Patient states he has lung cancer for which he finished treatment with chemotherapy and radiation last month and is supposed to have a PET scan in the upcoming weeks and follow-up with his oncologist.  He has had shortness of breath over the past month for which he was treated with steroids and antibiotics twice by his pulmonologist but his shortness of breath has not improved and he has also experienced right-sided sharp chest pain which he thinks is worse after eating.  States yesterday he saw his pulmonologist again and was sent to the emergency room due to abnormal lab and concern for blood clot in his lungs.  He denies history of prior PE.  Denies any  recent travel or surgeries.  He is not sure about his family history as he was adopted.  Denies leg pain or swelling.  Patient has no other complaints.  Review of Systems:  Review of Systems  All other systems reviewed and are negative.   Past Medical History:  Diagnosis Date   Cancer (HCC)    lung cancer   CAP (community acquired pneumonia)    COPD (chronic obstructive pulmonary disease) (HCC)    Eczema    HLD (hyperlipidemia)    Primary cancer of left lower lobe of lung (HCC) 09/17/2022   Tobacco use disorder 01/24/2017    Past Surgical History:  Procedure Laterality Date   BRONCHIAL BIOPSY  08/29/2022   Procedure: BRONCHIAL BIOPSIES;  Surgeon: [name], MD;  Location: [facility] ENDOSCOPY;  Service: Pulmonary;;   BRONCHIAL BRUSHINGS  08/29/2022   Procedure: BRONCHIAL BRUSHINGS;  Surgeon: [name], MD;  Location: [facility] ENDOSCOPY;  Service: Pulmonary;;   BRONCHIAL NEEDLE ASPIRATION BIOPSY  08/29/2022   Procedure: BRONCHIAL NEEDLE ASPIRATION BIOPSIES;  Surgeon: [name], MD;  Location: [facility] ENDOSCOPY;  Service: Pulmonary;;   BRONCHIAL WASHINGS  08/29/2022   Procedure: BRONCHIAL WASHINGS;  Surgeon: [name], MD;  Location: [facility] ENDOSCOPY;  Service: Pulmonary;;   FIDUCIAL MARKER PLACEMENT  08/29/2022   Procedure: FIDUCIAL MARKER PLACEMENT;  Surgeon: [name], MD;  Location: [facility] ENDOSCOPY;  Service: Pulmonary;;   HERNIA REPAIR     as an infant   IR IMAGING GUIDED PORT INSERTION  10/03/2022   IR RADIOLOGIST EVAL & MGMT  10/21/2022  VIDEO BRONCHOSCOPY WITH ENDOBRONCHIAL ULTRASOUND  08/29/2022   Procedure: VIDEO BRONCHOSCOPY WITH ENDOBRONCHIAL ULTRASOUND;  Surgeon: [name], MD;  Location: [facility] ENDOSCOPY;  Service: Pulmonary;;     reports that he quit smoking about 5 years ago. His smoking use included cigarettes. He has a 52.50 pack-year smoking history. He has never used smokeless tobacco. He reports that he does not drink alcohol and does not use  drugs.  No Known Allergies  History reviewed. No pertinent family history.  Prior to Admission medications   Medication Sig Start Date End Date Taking? Authorizing Provider  albuterol (PROVENTIL) (2.5 MG/3ML) 0.083% nebulizer solution Take 3 mLs (2.5 mg total) by nebulization every 6 (six) hours as needed for wheezing or shortness of breath. 11/19/22   Cobb, Ruby Cola, NP  albuterol (VENTOLIN HFA) 108 (90 Base) MCG/ACT inhaler Inhale 2 puffs into the lungs every 6 (six) hours as needed for wheezing. 01/04/22   [name], NP  aspirin EC 81 MG tablet Take 81 mg by mouth every evening. Swallow whole.    [provider]  atorvastatin (LIPITOR) 20 MG tablet TAKE ONE TABLET BY MOUTH DAILY 07/08/22   [name], NP  benzonatate (TESSALON) 200 MG capsule Take 1 capsule (200 mg total) by mouth 3 (three) times daily as needed for cough. 11/19/22   Cobb, Ruby Cola, NP  Budeson-Glycopyrrol-Formoterol (BREZTRI AEROSPHERE) 160-9-4.8 MCG/ACT AERO Inhale 2 puffs into the lungs in the morning and at bedtime. 10/29/22   [name], MD  COENZYME Q10 PO Take 100 mg by mouth in the morning.    [provider]  folic acid (FOLVITE) 1 MG tablet Take 1 tablet (1 mg total) by mouth daily. Start 7 days before pemetrexed chemotherapy. Continue until 21 days after pemetrexed completed. 09/17/22   [name], MD  GUAIFENESIN 1200 PO Take 1,200 mg by mouth in the morning and at bedtime.    [provider]  lidocaine-prilocaine (EMLA) cream Apply a dime size to port-a-cath 1-2 hours prior to access. Cover with saran wrap. Patient not taking: Reported on 11/19/2022 09/23/22   [name], MD  predniSONE (DELTASONE) 10 MG tablet 4 tabs for 4 days, then 3 tabs for 4 days, 2 tabs for 4 days, then 1 tab for 4 days, then stop 11/19/22   Cobb, Ruby Cola, NP  telmisartan (MICARDIS) 40 MG tablet Take 1 tablet (40 mg total) by mouth daily. 10/14/22   [name], MD    Physical  Exam: Vitals:   12/04/22 2205 12/04/22 2245 12/04/22 2330 12/05/22 0016  BP:  (!) 134/91 (!) 142/95 128/88  Pulse:  84 86 88  Resp:  20 (!) 23 20  Temp: 98.5 F (36.9 C)   98.3 F (36.8 C)  TempSrc: Oral   Oral  SpO2:  97% 95% 97%  Weight:    73.3 kg  Height:    5\' 5"  (1.651 m)    Physical Exam Vitals reviewed.  Constitutional:      General: He is not in acute distress. HENT:     Head: Normocephalic and atraumatic.  Eyes:     Extraocular Movements: Extraocular movements intact.  Cardiovascular:     Rate and Rhythm: Normal rate and regular rhythm.     Pulses: Normal pulses.  Pulmonary:     Effort: Pulmonary effort is normal. No respiratory distress.     Breath sounds: Normal breath sounds. No wheezing or rales.  Abdominal:     General: Bowel sounds  are normal. There is no distension.     Palpations: Abdomen is soft.     Tenderness: There is no abdominal tenderness.  Musculoskeletal:     Cervical back: Normal range of motion.     Right lower leg: No edema.     Left lower leg: No edema.  Skin:    General: Skin is warm and dry.  Neurological:     General: No focal deficit present.     Mental Status: He is alert and oriented to person, place, and time.     Labs on Admission: I have personally reviewed following labs and imaging studies  CBC: Recent Labs  Lab 12/04/22 1825  WBC 13.1*  HGB 12.9*  HCT 38.2*  MCV 94.6  PLT 239   Basic Metabolic Panel: Recent Labs  Lab 12/04/22 1458 12/04/22 1825  NA 137 134*  K 3.9 3.9  CL 102 102  CO2 26 23  GLUCOSE 110* 92  BUN 17 17  CREATININE 1.01 1.00  CALCIUM 8.6 8.5*   GFR: Estimated Creatinine Clearance: 72.6 mL/min (by C-G formula based on SCr of 1 mg/dL). Liver Function Tests: No results for input(s): "AST", "ALT", "ALKPHOS", "BILITOT", "PROT", "ALBUMIN" in the last 168 hours. No results for input(s): "LIPASE", "AMYLASE" in the last 168 hours. No results for input(s): "AMMONIA" in the last 168  hours. Coagulation Profile: No results for input(s): "INR", "PROTIME" in the last 168 hours. Cardiac Enzymes: No results for input(s): "CKTOTAL", "CKMB", "CKMBINDEX", "TROPONINI" in the last 168 hours. BNP (last 3 results) No results for input(s): "PROBNP" in the last 8760 hours. HbA1C: No results for input(s): "HGBA1C" in the last 72 hours. CBG: No results for input(s): "GLUCAP" in the last 168 hours. Lipid Profile: No results for input(s): "CHOL", "HDL", "LDLCALC", "TRIG", "CHOLHDL", "LDLDIRECT" in the last 72 hours. Thyroid Function Tests: No results for input(s): "TSH", "T4TOTAL", "FREET4", "T3FREE", "THYROIDAB" in the last 72 hours. Anemia Panel: No results for input(s): "VITAMINB12", "FOLATE", "FERRITIN", "TIBC", "IRON", "RETICCTPCT" in the last 72 hours. Urine analysis: No results found for: "COLORURINE", "APPEARANCEUR", "LABSPEC", "PHURINE", "GLUCOSEU", "HGBUR", "BILIRUBINUR", "KETONESUR", "PROTEINUR", "UROBILINOGEN", "NITRITE", "LEUKOCYTESUR"  Radiological Exams on Admission: CT Angio Chest PE W and/or Wo Contrast  Result Date: 12/04/2022 CLINICAL DATA:  Concern for pulmonary embolism. EXAM: CT ANGIOGRAPHY CHEST WITH CONTRAST TECHNIQUE: Multidetector CT imaging of the chest was performed using the standard protocol during bolus administration of intravenous contrast. Multiplanar CT image reconstructions and MIPs were obtained to evaluate the vascular anatomy. RADIATION DOSE REDUCTION: This exam was performed according to the departmental dose-optimization program which includes automated exposure control, adjustment of the mA and/or kV according to patient size and/or use of iterative reconstruction technique. CONTRAST:  75mL OMNIPAQUE IOHEXOL 350 MG/ML SOLN COMPARISON:  Chest CT dated 07/25/2022 and radiograph dated 11/19/2022. FINDINGS: Cardiovascular: There is no cardiomegaly or pericardial effusion. There is coronary vascular calcification. The thoracic aorta is unremarkable.  Right-sided Port-A-Cath with tip in the right atrium close to the cavoatrial junction. There is a small subsegmental right lower lobe pulmonary artery embolus. No CT evidence of right heart straining. Mediastinum/Nodes: Left lower lobe/posterior perihilar mass measures approximately 2.2 x 1.8 cm (previously 4.1 x 2.7 cm on CT of 07/25/2022). No mediastinal adenopathy. The esophagus is grossly unremarkable. No mediastinal fluid collection. Lungs/Pleura: Left lower lobe mass described above. No new consolidation. There is no pleural effusion or pneumothorax. There is background of emphysema. Upper Abdomen: No acute abnormality. Musculoskeletal: No acute findings. Review of the MIP images  confirms the above findings. IMPRESSION: 1. Small subsegmental right lower lobe pulmonary artery embolus. No CT evidence of right heart straining. 2. Interval decrease in the size of the left lower lobe/posterior perihilar mass. These results were called by telephone at the time of interpretation on 12/04/2022 at 8:37 pm to provider [facility] , who verbally acknowledged these results. Electronically Signed   By: Elgie Collard M.D.   On: 12/04/2022 20:37    EKG: Independently reviewed.  Sinus rhythm, slight ST/T wave abnormality in lateral leads.  No prior tracing for comparison.  Assessment and Plan  Acute small subsegmental right lower lobe PE In the setting of lung cancer.  No CT evidence of right heart strain.  Troponin is negative.  Vital signs stable, no tachycardia or hypoxia.  Continue IV heparin and monitor hemodynamics.  Continuous pulse ox, supplemental oxygen as needed.  Bilateral lower extremity Dopplers ordered.  Stage IIb adenocarcinoma of the left lower lung Patient has declined surgery in the past and was treated with chemotherapy and radiation therapy.  Patient states he finished treatment last month and has a PET scan scheduled in the upcoming weeks and follow-up visit with his oncologist.  CT done  at this time showing interval decrease in the size of the left lower lobe/posterior perihilar mass.  COPD Recently treated for COPD exacerbation with steroids and antibiotics.  Patient is not wheezing on exam.  Continue Breztri, albuterol inhaler PRN.  Mild leukocytosis In the setting of recent steroid use.  No infectious signs or symptoms.  Repeat CBC in a.m.  Hypertension: Currently normotensive. Hyperlipidemia Pharmacy med rec pending.  Code Status: Full Code (discussed with the patient) Family Communication: No family available at this time. Level of care: Progressive Care Unit Admission status: It is my clinical opinion that referral for OBSERVATION is reasonable and necessary in this patient based on the above information provided. The aforementioned taken together are felt to place the patient at high risk for further clinical deterioration. However, it is anticipated that the patient may be medically stable for discharge from the hospital within 24 to 48 hours.   John Giovanni MD [facility]  If 7PM-7AM, please contact night-coverage [email or web address]  12/05/2022, 2:58 AM

## 2022-12-05 NOTE — Discharge Summary (Signed)
Physician Discharge Summary  Brandon Phillips GNF:[id number] DOB: Sep 19, 1965 DOA: 12/04/2022  PCP: Brandon Butter, NP  Admit date: 12/04/2022 Discharge date: 12/05/2022  Admitted From: Home Discharge disposition: Home  Recommendations at discharge:  You been started on a blood thinner called Eliquis.  Please be watchful of any bleeding in his stool or vomitus or excessive bruisability.   Stop aspirin while on Eliquis Follow-up with oncologist as an outpatient   Brief narrative: Brandon Phillips is a 57 y.o. 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  In the upcoming weeks, he was supposed to have a PET scan and a follow-up with oncology. For the past month, he has had progressively worsening shortness of breath and was treated with steroids and antibiotics twice by his pulmonologist without much improvement. 5/8, went for a follow-up visit.  D-dimer was checked which came back positive and was hence sent to the ED to rule out PE  In the ED, vital signs unremarkable. WBC 13.1, hemoglobin 12.9, platelet count 239, creatinine 1.   CTA chest showed small subsegmental right lower lobe pulmonary artery embolus without evidence of right heart strain.  Patient also showed an interval decrease in the size of the left lower lobe/posterior perihilar mass. Patient was started on IV heparin  Admitted to [facility]   Subjective: Patient was seen and examined this morning.  Pleasant middle-aged Caucasian male.  Not in distress.  Wife at bedside.  Patient was able to ambulate around without shortness of breath. Chart reviewed Hemodynamically stable Repeat labs this morning with WC count normal, hemoglobin 11.7  Hospital course: Acute small subsegmental right lower lobe PE Sent to the ED with persistent shortness of  breath and elevated D-dimer in the setting of lung cancer.  CTA chest as above.  Lower extremities negative for DVT Initially started on IV heparin drip.  I discussed the case with patient's oncologist Dr. Myna Hidalgo.  He suggested to switch from IV heparin to oral Eliquis for discharge.   Starter pack given at discharge.  To follow-up with Dr. Myna Hidalgo as an outpatient.   Stage IIb adenocarcinoma of the left lower lung Patient has declined surgery in the past and was treated with chemotherapy and radiation therapy.  Patient states he finished treatment last month and has a PET scan scheduled in the upcoming weeks and follow-up visit with his oncologist.   CT done this admission showed interval decrease in the size of the left lower lobe/posterior perihilar mass.   COPD Recently treated for COPD exacerbation with steroids and antibiotics.  Currently not wheezing.   Continue Breztri, albuterol inhaler PRN.    Hypertension: PTA on telmisartan 40 mg daily  Resume the same post discharge.  Hyperlipidemia Continue Lipitor.  Aspirin can be held while on anticoagulation.  Goals of care   Code Status: Full Code   Wounds:  -    Discharge Exam:   Vitals:   12/05/22 0016 12/05/22 0416 12/05/22 0815 12/05/22 1159  BP: 128/88 113/76 112/70 121/77  Pulse: 88 76 97 94  Resp: 20 20 20 20   Temp: 98.3 F (36.8 C) 98.5 F (36.9 C) 98.2 F (36.8 C) 99.2 F (37.3 C)  TempSrc: Oral Oral Oral Oral  SpO2: 97% 96% 92% 98%  Weight: 73.3 kg     Height: 5\' 5"  (1.651 m)  Body mass index is 26.91 kg/m.  General exam: Pleasant, middle-aged Caucasian male.  Not in distress Skin: No rashes, lesions or ulcers. HEENT: Atraumatic, normocephalic, no obvious bleeding Lungs: Clear to auscultation bilaterally CVS: Regular rate and rhythm, no murmur GI/Abd soft, nontender, nondistended, bowel sound present CNS: Alert, awake, oriented x 3 Psychiatry: Mood appropriate Extremities: No pedal edema, no  calf tenderness  Follow ups:    Follow-up Information     Brandon Butter, NP Follow up.   Specialty: Nurse Practitioner Contact information: [address] [place] [address] [phone]                 Discharge Instructions:    Discharge Medications:   Allergies as of 12/05/2022   No Known Allergies      Medication List     STOP taking these medications    aspirin EC 81 MG tablet       TAKE these medications    albuterol 108 (90 Base) MCG/ACT inhaler Commonly known as: VENTOLIN HFA Inhale 2 puffs into the lungs every 6 (six) hours as needed for wheezing.   albuterol (2.5 MG/3ML) 0.083% nebulizer solution Commonly known as: PROVENTIL Take 3 mLs (2.5 mg total) by nebulization every 6 (six) hours as needed for wheezing or shortness of breath.   Apixaban Starter Pack (10mg  and 5mg ) Commonly known as: ELIQUIS STARTER PACK Take as directed on package: start with two-5mg  tablets twice daily for 7 days. On day 8, switch to one-5mg  tablet twice daily.   atorvastatin 20 MG tablet Commonly known as: LIPITOR TAKE ONE TABLET BY MOUTH DAILY   benzonatate 200 MG capsule Commonly known as: TESSALON Take 1 capsule (200 mg total) by mouth 3 (three) times daily as needed for cough.   Breztri Aerosphere 160-9-4.8 MCG/ACT Aero Generic drug: Budeson-Glycopyrrol-Formoterol Inhale 2 puffs into the lungs in the morning and at bedtime.   COENZYME Q10 PO Take 100 mg by mouth in the morning.   folic acid 1 MG tablet Commonly known as: FOLVITE Take 1 tablet (1 mg total) by mouth daily. Start 7 days before pemetrexed chemotherapy. Continue until 21 days after pemetrexed completed.   GUAIFENESIN 1200 PO Take 1,200 mg by mouth in the morning and at bedtime.   lidocaine-prilocaine cream Commonly known as: EMLA Apply a dime size to port-a-cath 1-2 hours prior to access. Cover with saran wrap.   predniSONE 10 MG tablet Commonly known as: DELTASONE 4  tabs for 4 days, then 3 tabs for 4 days, 2 tabs for 4 days, then 1 tab for 4 days, then stop   telmisartan 40 MG tablet Commonly known as: Micardis Take 1 tablet (40 mg total) by mouth daily.         The results of significant diagnostics from this hospitalization (including imaging, microbiology, ancillary and laboratory) are listed below for reference.    Procedures and Diagnostic Studies:   VAS [place] LOWER EXTREMITY VENOUS (DVT)  Result Date: 12/05/2022  Lower Venous DVT Study Patient Name:  Brandon Phillips  Date of Exam:   12/05/2022 Medical Rec #: [id number]      Accession #:    [id number] Date of Birth: 1965/12/27     Patient Gender: M Patient Age:   40 years Exam Location:  [facility] Procedure:      VAS [place] LOWER EXTREMITY VENOUS (DVT) Referring Phys: Ulyess Blossom RATHORE --------------------------------------------------------------------------------  Indications: Pulmonary embolism.  Risk Factors: Cancer - Adenocarcinoma. Anticoagulation: Heparin. Comparison Study: No previous  study. Performing Technologist: McKayla Maag RVT, [place]  Examination Guidelines: A complete evaluation includes B-mode imaging, spectral Doppler, color Doppler, and power Doppler as needed of all accessible portions of each vessel. Bilateral testing is considered an integral part of a complete examination. Limited examinations for reoccurring indications may be performed as noted. The reflux portion of the exam is performed with the patient in reverse Trendelenburg.  +---------+---------------+---------+-----------+----------+--------------+ RIGHT    CompressibilityPhasicitySpontaneityPropertiesThrombus Aging +---------+---------------+---------+-----------+----------+--------------+ CFV      Full           Yes      Yes                                 +---------+---------------+---------+-----------+----------+--------------+ SFJ      Full                                                         +---------+---------------+---------+-----------+----------+--------------+ FV Prox  Full                                                        +---------+---------------+---------+-----------+----------+--------------+ FV Mid   Full                                                        +---------+---------------+---------+-----------+----------+--------------+ FV DistalFull                                                        +---------+---------------+---------+-----------+----------+--------------+ PFV      Full                                                        +---------+---------------+---------+-----------+----------+--------------+ POP      Full           Yes      Yes                                 +---------+---------------+---------+-----------+----------+--------------+ PTV      Full                                                        +---------+---------------+---------+-----------+----------+--------------+ PERO     Full                                                        +---------+---------------+---------+-----------+----------+--------------+   +---------+---------------+---------+-----------+----------+--------------+  LEFT     CompressibilityPhasicitySpontaneityPropertiesThrombus Aging +---------+---------------+---------+-----------+----------+--------------+ CFV      Full           Yes      Yes                                 +---------+---------------+---------+-----------+----------+--------------+ SFJ      Full                                                        +---------+---------------+---------+-----------+----------+--------------+ FV Prox  Full                                                        +---------+---------------+---------+-----------+----------+--------------+ FV Mid   Full                                                         +---------+---------------+---------+-----------+----------+--------------+ FV DistalFull                                                        +---------+---------------+---------+-----------+----------+--------------+ PFV      Full                                                        +---------+---------------+---------+-----------+----------+--------------+ POP      Full           Yes      Yes                                 +---------+---------------+---------+-----------+----------+--------------+ PTV      Full                                                        +---------+---------------+---------+-----------+----------+--------------+ PERO     Full                                                        +---------+---------------+---------+-----------+----------+--------------+    Summary: BILATERAL: - No evidence of deep vein thrombosis seen in the lower extremities, bilaterally. - No evidence of superficial venous thrombosis in the lower extremities, bilaterally. -No evidence of popliteal cyst, bilaterally.   *See table(s) above for measurements and observations.  Preliminary    CT Angio Chest PE W and/or Wo Contrast  Result Date: 12/04/2022 CLINICAL DATA:  Concern for pulmonary embolism. EXAM: CT ANGIOGRAPHY CHEST WITH CONTRAST TECHNIQUE: Multidetector CT imaging of the chest was performed using the standard protocol during bolus administration of intravenous contrast. Multiplanar CT image reconstructions and MIPs were obtained to evaluate the vascular anatomy. RADIATION DOSE REDUCTION: This exam was performed according to the departmental dose-optimization program which includes automated exposure control, adjustment of the mA and/or kV according to patient size and/or use of iterative reconstruction technique. CONTRAST:  75mL OMNIPAQUE IOHEXOL 350 MG/ML SOLN COMPARISON:  Chest CT dated 07/25/2022 and radiograph dated 11/19/2022. FINDINGS: Cardiovascular: There  is no cardiomegaly or pericardial effusion. There is coronary vascular calcification. The thoracic aorta is unremarkable. Right-sided Port-A-Cath with tip in the right atrium close to the cavoatrial junction. There is a small subsegmental right lower lobe pulmonary artery embolus. No CT evidence of right heart straining. Mediastinum/Nodes: Left lower lobe/posterior perihilar mass measures approximately 2.2 x 1.8 cm (previously 4.1 x 2.7 cm on CT of 07/25/2022). No mediastinal adenopathy. The esophagus is grossly unremarkable. No mediastinal fluid collection. Lungs/Pleura: Left lower lobe mass described above. No new consolidation. There is no pleural effusion or pneumothorax. There is background of emphysema. Upper Abdomen: No acute abnormality. Musculoskeletal: No acute findings. Review of the MIP images confirms the above findings. IMPRESSION: 1. Small subsegmental right lower lobe pulmonary artery embolus. No CT evidence of right heart straining. 2. Interval decrease in the size of the left lower lobe/posterior perihilar mass. These results were called by telephone at the time of interpretation on 12/04/2022 at 8:37 pm to provider [facility] , who verbally acknowledged these results. Electronically Signed   By: Elgie Collard M.D.   On: 12/04/2022 20:37     Labs:   Basic Metabolic Panel: Recent Labs  Lab 12/04/22 1458 12/04/22 1825  NA 137 134*  K 3.9 3.9  CL 102 102  CO2 26 23  GLUCOSE 110* 92  BUN 17 17  CREATININE 1.01 1.00  CALCIUM 8.6 8.5*   GFR Estimated Creatinine Clearance: 72.6 mL/min (by C-G formula based on SCr of 1 mg/dL). Liver Function Tests: No results for input(s): "AST", "ALT", "ALKPHOS", "BILITOT", "PROT", "ALBUMIN" in the last 168 hours. No results for input(s): "LIPASE", "AMYLASE" in the last 168 hours. No results for input(s): "AMMONIA" in the last 168 hours. Coagulation profile No results for input(s): "INR", "PROTIME" in the last 168 hours.  CBC: Recent  Labs  Lab 12/04/22 1825 12/05/22 0640  WBC 13.1* 9.4  HGB 12.9* 11.7*  HCT 38.2* 35.4*  MCV 94.6 95.4  PLT 239 192   Cardiac Enzymes: No results for input(s): "CKTOTAL", "CKMB", "CKMBINDEX", "TROPONINI" in the last 168 hours. BNP: Invalid input(s): "POCBNP" CBG: No results for input(s): "GLUCAP" in the last 168 hours. D-Dimer Recent Labs    12/04/22 1458  DDIMER 0.79*   Hgb A1c No results for input(s): "HGBA1C" in the last 72 hours. Lipid Profile No results for input(s): "CHOL", "HDL", "LDLCALC", "TRIG", "CHOLHDL", "LDLDIRECT" in the last 72 hours. Thyroid function studies No results for input(s): "TSH", "T4TOTAL", "T3FREE", "THYROIDAB" in the last 72 hours.  Invalid input(s): "FREET3" Anemia work up No results for input(s): "VITAMINB12", "FOLATE", "FERRITIN", "TIBC", "IRON", "RETICCTPCT" in the last 72 hours. Microbiology No results found for this or any previous visit (from the past 240 hour(s)).  Time coordinating discharge: 45 minutes  Signed: Daryana Whirley  [facility] 12/05/2022, 12:55 PM

## 2022-12-05 NOTE — Progress Notes (Signed)
Bilateral lower extremity venous study completed.   Preliminary results relayed to RN.  Please see CV Procedures for preliminary results.  Christene Lye, RVT  9:44 AM 12/05/22

## 2022-12-05 NOTE — Telephone Encounter (Signed)
Pharmacy Patient Advocate Encounter  Insurance verification completed.    The patient is insured through [facility]   The patient is currently admitted and ran test claims for the following: Xarelto, Eliquis.  Copays and coinsurance results were relayed to Inpatient clinical team.

## 2022-12-05 NOTE — Progress Notes (Signed)
ANTICOAGULATION CONSULT NOTE   Pharmacy Consult for heparin Indication: pulmonary embolus  No Known Allergies  Patient Measurements: Height: 5\' 5"  (165.1 cm) Weight: 73.3 kg (161 lb 11.2 oz) IBW/kg (Calculated) : 61.5 Heparin Dosing Weight: 77 kg  Vital Signs: Temp: 98.2 F (36.8 C) (05/09 0815) Temp Source: Oral (05/09 0815) BP: 112/70 (05/09 0815) Pulse Rate: 97 (05/09 0815)  Labs: Recent Labs    12/04/22 1458 12/04/22 1825 12/04/22 2210 12/05/22 0640  HGB  --  12.9*  --  11.7*  HCT  --  38.2*  --  35.4*  PLT  --  239  --  192  HEPARINUNFRC  --   --   --  1.08*  CREATININE 1.01 1.00  --   --   TROPONINIHS  --   --  7  --      Estimated Creatinine Clearance: 72.6 mL/min (by C-G formula based on SCr of 1 mg/dL).   Medical History: Past Medical History:  Diagnosis Date   Cancer (HCC)    lung cancer   CAP (community acquired pneumonia)    COPD (chronic obstructive pulmonary disease) (HCC)    Eczema    HLD (hyperlipidemia)    Primary cancer of left lower lobe of lung (HCC) 09/17/2022   Tobacco use disorder 01/24/2017    Medications:  Medications Prior to Admission  Medication Sig Dispense Refill Last Dose   albuterol (PROVENTIL) (2.5 MG/3ML) 0.083% nebulizer solution Take 3 mLs (2.5 mg total) by nebulization every 6 (six) hours as needed for wheezing or shortness of breath. 75 mL 5    albuterol (VENTOLIN HFA) 108 (90 Base) MCG/ACT inhaler Inhale 2 puffs into the lungs every 6 (six) hours as needed for wheezing. 2 each 11    aspirin EC 81 MG tablet Take 81 mg by mouth every evening. Swallow whole.      atorvastatin (LIPITOR) 20 MG tablet TAKE ONE TABLET BY MOUTH DAILY 90 tablet 3    benzonatate (TESSALON) 200 MG capsule Take 1 capsule (200 mg total) by mouth 3 (three) times daily as needed for cough. 30 capsule 1    Budeson-Glycopyrrol-Formoterol (BREZTRI AEROSPHERE) 160-9-4.8 MCG/ACT AERO Inhale 2 puffs into the lungs in the morning and at bedtime. 1 each 11     COENZYME Q10 PO Take 100 mg by mouth in the morning.      folic acid (FOLVITE) 1 MG tablet Take 1 tablet (1 mg total) by mouth daily. Start 7 days before pemetrexed chemotherapy. Continue until 21 days after pemetrexed completed. 100 tablet 3    GUAIFENESIN 1200 PO Take 1,200 mg by mouth in the morning and at bedtime.      lidocaine-prilocaine (EMLA) cream Apply a dime size to port-a-cath 1-2 hours prior to access. Cover with saran wrap. (Patient not taking: Reported on 11/19/2022) 30 g 2    predniSONE (DELTASONE) 10 MG tablet 4 tabs for 4 days, then 3 tabs for 4 days, 2 tabs for 4 days, then 1 tab for 4 days, then stop 40 tablet 0    telmisartan (MICARDIS) 40 MG tablet Take 1 tablet (40 mg total) by mouth daily. 30 tablet 4    Scheduled:   fluticasone furoate-vilanterol  1 puff Inhalation Daily   umeclidinium bromide  1 puff Inhalation Daily   Infusions:   heparin 1,300 Units/hr (12/04/22 2300)    Assessment: Presented with SOB  1 month. CTA showed small subsegmental RLL PA embolus with no evidence of RHS. Hgb 11.7 and PLTc 192K.  Ddimer elevated at 0.79. Vitals stable. No anticoagulation PTA  Goal of Therapy:  Heparin level 0.3-0.7 units/ml Monitor platelets by anticoagulation protocol: Yes   Plan:  Decrease heparin to 1100 units/hr Repeat heparin level in 6 hrs. Daily heparin level and CBC. F/u switch to oral anticoag soon?  Reece Leader, Colon Flattery, BCCP Clinical Pharmacist  12/05/2022 9:35 AM   [facility] pharmacy phone numbers are listed on [email or web address]

## 2022-12-05 NOTE — TOC Benefit Eligibility Note (Signed)
Patient Product/process development scientist completed.    The patient is currently admitted and upon discharge could be taking Eliquis.  The current 30 day co-pay is $0.00.   The patient is currently admitted and upon discharge could be taking Xarelto.  The current 30 day co-pay is $0.00.   The patient is insured through [facility]   This test claim was processed through [facility]- copay amounts may vary at other pharmacies due to pharmacy/plan contracts, or as the patient moves through the different stages of their insurance plan.

## 2022-12-05 NOTE — Plan of Care (Signed)

## 2022-12-09 ENCOUNTER — Inpatient Hospital Stay: Payer: BC Managed Care – PPO | Attending: Hematology & Oncology

## 2022-12-09 ENCOUNTER — Encounter: Payer: Self-pay | Admitting: Hematology & Oncology

## 2022-12-09 ENCOUNTER — Inpatient Hospital Stay: Payer: BC Managed Care – PPO

## 2022-12-09 ENCOUNTER — Other Ambulatory Visit: Payer: Self-pay

## 2022-12-09 ENCOUNTER — Inpatient Hospital Stay (HOSPITAL_BASED_OUTPATIENT_CLINIC_OR_DEPARTMENT_OTHER): Payer: BC Managed Care – PPO | Admitting: Hematology & Oncology

## 2022-12-09 VITALS — BP 116/74 | HR 99 | Temp 98.0°F | Resp 18 | Ht 65.0 in | Wt 170.0 lb

## 2022-12-09 DIAGNOSIS — R1310 Dysphagia, unspecified: Secondary | ICD-10-CM | POA: Diagnosis not present

## 2022-12-09 DIAGNOSIS — I2699 Other pulmonary embolism without acute cor pulmonale: Secondary | ICD-10-CM

## 2022-12-09 DIAGNOSIS — Z923 Personal history of irradiation: Secondary | ICD-10-CM | POA: Diagnosis not present

## 2022-12-09 DIAGNOSIS — Z79899 Other long term (current) drug therapy: Secondary | ICD-10-CM | POA: Diagnosis not present

## 2022-12-09 DIAGNOSIS — I2692 Saddle embolus of pulmonary artery without acute cor pulmonale: Secondary | ICD-10-CM | POA: Diagnosis not present

## 2022-12-09 DIAGNOSIS — R7989 Other specified abnormal findings of blood chemistry: Secondary | ICD-10-CM

## 2022-12-09 DIAGNOSIS — I2693 Single subsegmental pulmonary embolism without acute cor pulmonale: Secondary | ICD-10-CM | POA: Insufficient documentation

## 2022-12-09 DIAGNOSIS — C3432 Malignant neoplasm of lower lobe, left bronchus or lung: Secondary | ICD-10-CM

## 2022-12-09 DIAGNOSIS — Z7901 Long term (current) use of anticoagulants: Secondary | ICD-10-CM | POA: Diagnosis not present

## 2022-12-09 LAB — TSH: TSH: 5.159 u[IU]/mL — ABNORMAL HIGH (ref 0.350–4.500)

## 2022-12-09 LAB — CBC WITH DIFFERENTIAL (CANCER CENTER ONLY)
Abs Immature Granulocytes: 0.11 10*3/uL — ABNORMAL HIGH (ref 0.00–0.07)
Basophils Absolute: 0 10*3/uL (ref 0.0–0.1)
Basophils Relative: 0 %
Eosinophils Absolute: 0 10*3/uL (ref 0.0–0.5)
Eosinophils Relative: 0 %
HCT: 38.8 % — ABNORMAL LOW (ref 39.0–52.0)
Hemoglobin: 13.4 g/dL (ref 13.0–17.0)
Immature Granulocytes: 1 %
Lymphocytes Relative: 7 %
Lymphs Abs: 0.8 10*3/uL (ref 0.7–4.0)
MCH: 32.2 pg (ref 26.0–34.0)
MCHC: 34.5 g/dL (ref 30.0–36.0)
MCV: 93.3 fL (ref 80.0–100.0)
Monocytes Absolute: 0.7 10*3/uL (ref 0.1–1.0)
Monocytes Relative: 7 %
Neutro Abs: 9.2 10*3/uL — ABNORMAL HIGH (ref 1.7–7.7)
Neutrophils Relative %: 85 %
Platelet Count: 201 10*3/uL (ref 150–400)
RBC: 4.16 MIL/uL — ABNORMAL LOW (ref 4.22–5.81)
RDW: 19.7 % — ABNORMAL HIGH (ref 11.5–15.5)
WBC Count: 10.9 10*3/uL — ABNORMAL HIGH (ref 4.0–10.5)
nRBC: 0 % (ref 0.0–0.2)

## 2022-12-09 LAB — CMP (CANCER CENTER ONLY)
ALT: 41 U/L (ref 0–44)
AST: 15 U/L (ref 15–41)
Albumin: 4.4 g/dL (ref 3.5–5.0)
Alkaline Phosphatase: 41 U/L (ref 38–126)
Anion gap: 12 (ref 5–15)
BUN: 18 mg/dL (ref 6–20)
CO2: 23 mmol/L (ref 22–32)
Calcium: 9.3 mg/dL (ref 8.9–10.3)
Chloride: 97 mmol/L — ABNORMAL LOW (ref 98–111)
Creatinine: 1.12 mg/dL (ref 0.61–1.24)
GFR, Estimated: >60 mL/min (ref >60)
Glucose, Bld: 163 mg/dL — ABNORMAL HIGH (ref 70–99)
Potassium: 3.8 mmol/L (ref 3.5–5.1)
Sodium: 132 mmol/L — ABNORMAL LOW (ref 135–145)
Total Bilirubin: 0.5 mg/dL (ref 0.3–1.2)
Total Protein: 7.3 g/dL (ref 6.5–8.1)

## 2022-12-09 LAB — LACTATE DEHYDROGENASE: LDH: 236 U/L — ABNORMAL HIGH (ref 98–192)

## 2022-12-09 LAB — ANTITHROMBIN III: AntiThromb III Func: 118 % (ref 75–120)

## 2022-12-09 NOTE — Patient Instructions (Signed)

## 2022-12-09 NOTE — Progress Notes (Signed)
Hematology and Oncology Follow Up Visit  Brandon Phillips [id number] 1965-10-28 57 y.o. 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 twice daily-start on 12/06/2022     Interim History:  Brandon Phillips is back for follow-up.  Unfortunately, he ended up in the hospital about a week or so ago.  He had a cough.  He went to his family doctor.  She was incredibly brilliant and felt that he needed to be evaluated for a pulmonary embolism.  As such, he had an elevated D-dimer.  He was sent to the ER.  He had a CT angiogram done.  This did show a small subsegmental right pulmonary artery blood clot.  He was admitted.  I think was put on heparin.  He did have Dopplers of his legs which were negative.  He now is on Eliquis.  He is still on the loading dose of Eliquis.  He feels okay.  He is working.  Unfortunately, his insurance is not going to approve the PET scan as soon as we would like.  I will have to resubmit the request and see about doing this in mid June.  He has had no problem swallowing.  He is eating much better.  His dysphagia is improved.  He has had no change in bowel or bladder habits.  There is been no bleeding.  He has had no fever.  He is adopted so we do not know any family history of thromboembolic disease.  I am going to do a hypercoagulable panel on him just to make sure that there is nothing that could be inherited.  Currently, I would say his performance status is probably ECOG 1.     Medications:  Current Outpatient Medications:    albuterol (PROVENTIL) (2.5 MG/3ML) 0.083% nebulizer solution, Take 3 mLs (2.5 mg total) by nebulization every 6 (six) hours as needed for wheezing or shortness of breath., Disp: 75 mL, Rfl: 5   albuterol (VENTOLIN  HFA) 108 (90 Base) MCG/ACT inhaler, Inhale 2 puffs into the lungs every 6 (six) hours as needed for wheezing., Disp: 2 each, Rfl: 11   APIXABAN (ELIQUIS) VTE STARTER PACK (10MG  AND 5MG ), Take as directed on package: start with two-5mg  tablets twice daily for 7 days. On day 8, switch to one-5mg  tablet twice daily., Disp: 74 each, Rfl: 0   atorvastatin (LIPITOR) 20 MG tablet, TAKE ONE TABLET BY MOUTH DAILY, Disp: 90 tablet, Rfl: 3   benzonatate (TESSALON) 200 MG capsule, Take 1 capsule (200 mg total) by mouth 3 (three) times daily as needed for cough., Disp: 30 capsule, Rfl: 1   Budeson-Glycopyrrol-Formoterol (BREZTRI AEROSPHERE) 160-9-4.8 MCG/ACT AERO, Inhale 2 puffs into the lungs in the morning and at bedtime., Disp: 1 each, Rfl: 11   COENZYME Q10 PO, Take 100 mg by mouth in the morning., Disp: , Rfl:    folic acid (FOLVITE) 1 MG tablet, Take 1 tablet (1 mg total) by mouth daily. Start 7 days before pemetrexed chemotherapy. Continue until 21 days after pemetrexed completed., Disp: 100 tablet, Rfl: 3   GUAIFENESIN 1200 PO, Take 1,200 mg by mouth in the morning and at bedtime., Disp: , Rfl:    lidocaine-prilocaine (EMLA) cream, Apply a dime size to port-a-cath 1-2 hours prior to access. Cover with saran wrap.,  Disp: 30 g, Rfl: 2   telmisartan (MICARDIS) 40 MG tablet, Take 1 tablet (40 mg total) by mouth daily., Disp: 30 tablet, Rfl: 4  Allergies: No Known Allergies  Past Medical History, Surgical history, Social history, and Family History were reviewed and updated.  Review of Systems: Review of Systems  Constitutional: Negative.   HENT:  Negative.    Eyes: Negative.   Respiratory:  Positive for cough.   Cardiovascular: Negative.   Gastrointestinal: Negative.   Endocrine: Negative.   Genitourinary: Negative.    Musculoskeletal: Negative.   Skin: Negative.   Neurological: Negative.   Hematological: Negative.   Psychiatric/Behavioral: Negative.      Physical Exam:  height is 5\' 5"   (1.651 m) and weight is 170 lb (77.1 kg). His oral temperature is 98 F (36.7 C). His blood pressure is 116/74 and his pulse is 99. His respiration is 18 and oxygen saturation is 100%.   Wt Readings from Last 3 Encounters:  12/09/22 170 lb (77.1 kg)  12/05/22 161 lb 11.2 oz (73.3 kg)  12/04/22 170 lb 3.2 oz (77.2 kg)    Physical Exam Vitals reviewed.  HENT:     Head: Normocephalic and atraumatic.  Eyes:     Pupils: Pupils are equal, round, and reactive to light.  Cardiovascular:     Rate and Rhythm: Normal rate and regular rhythm.     Heart sounds: Normal heart sounds.  Pulmonary:     Effort: Pulmonary effort is normal.     Breath sounds: Normal breath sounds.  Abdominal:     General: Bowel sounds are normal.     Palpations: Abdomen is soft.  Musculoskeletal:        General: No tenderness or deformity. Normal range of motion.     Cervical back: Normal range of motion.  Lymphadenopathy:     Cervical: No cervical adenopathy.  Skin:    General: Skin is warm and dry.     Findings: No erythema or rash.  Neurological:     Mental Status: He is alert and oriented to person, place, and time.  Psychiatric:        Behavior: Behavior normal.        Thought Content: Thought content normal.        Judgment: Judgment normal.     Lab Results  Component Value Date   WBC 10.9 (H) 12/09/2022   HGB 13.4 12/09/2022   HCT 38.8 (L) 12/09/2022   MCV 93.3 12/09/2022   PLT 201 12/09/2022     Chemistry      Component Value Date/Time   NA 132 (L) 12/09/2022 0856   K 3.8 12/09/2022 0856   CL 97 (L) 12/09/2022 0856   CO2 23 12/09/2022 0856   BUN 18 12/09/2022 0856   CREATININE 1.12 12/09/2022 0856   CREATININE 1.05 01/11/2022 0000      Component Value Date/Time   CALCIUM 9.3 12/09/2022 0856   ALKPHOS 41 12/09/2022 0856   AST 15 12/09/2022 0856   ALT 41 12/09/2022 0856   BILITOT 0.5 12/09/2022 0856      Impression and Plan: Brandon Phillips is a very nice 57 year old white male.  He  has a 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  Again, he does not wish to have surgery.  He has seen Dr. Dorris Fetch of thoracic surgery.  He completed radiation chemotherapy.  I suspect that he has had a very good response to this.  We did then again put him on immunotherapy.  I think this would be very reasonable.  I am sure that his malignancy plus chemotherapy probably was a result of the thromboembolic disease.  Again I want to do a thrombophilia panel him to make sure that there is no inherited thromboplilic state.  I would keep him on Eliquis for at least a year at a therapeutic dose.  I would repeat a CT angiogram on him in a couple months.  Hopefully we will get the PET scan in June.  I would like to see him back afterwards so that we can get immunotherapy started on him.   [name], MD 5/13/202410:09 AM

## 2022-12-09 NOTE — Progress Notes (Signed)
DISCONTINUE OFF PATHWAY REGIMEN - Non-Small Cell Lung   [id number]:Carboplatin AUC=5 + Pemetrexed 500 mg/m2 q21 Days:   A cycle is every 21 days:     Pemetrexed      Carboplatin   **Always confirm dose/schedule in your pharmacy ordering system**  REASON: Other Reason PRIOR TREATMENT: Off Pathway: Carboplatin AUC=5 + Pemetrexed 500 mg/m2 q21 Days TREATMENT RESPONSE: Complete Response (CR)  START OFF PATHWAY REGIMEN - Non-Small Cell Lung   [id number]:Durvalumab 1,500 mg IV D1 q28 Days:   A cycle is every 28 days:     Durvalumab   **Always confirm dose/schedule in your pharmacy ordering system**  Patient Characteristics: Preoperative or Nonsurgical Candidate (Clinical Staging), Stage II, Nonsurgical Candidate Therapeutic Status: Preoperative or Nonsurgical Candidate (Clinical Staging) AJCC T Category: cT2b AJCC N Category: cN1 AJCC M Category: cM0 AJCC 8 Stage Grouping: IIB Intent of Therapy: Curative Intent, Discussed with Patient

## 2022-12-10 LAB — LUPUS ANTICOAGULANT PANEL
DRVVT: 76.9 s — ABNORMAL HIGH (ref 0.0–47.0)
PTT Lupus Anticoagulant: 36 s (ref 0.0–43.5)

## 2022-12-10 LAB — DRVVT MIX: dRVVT Mix: 60.7 s — ABNORMAL HIGH (ref 0.0–40.4)

## 2022-12-10 LAB — PROTEIN S ACTIVITY: Protein S Activity: 133 % (ref 63–140)

## 2022-12-10 LAB — CARDIOLIPIN ANTIBODIES, IGG, IGM, IGA
Anticardiolipin IgA: <9 APL U/mL (ref 0–11)
Anticardiolipin IgG: <9 GPL U/mL (ref 0–14)
Anticardiolipin IgM: <9 MPL U/mL (ref 0–12)

## 2022-12-10 LAB — PROTEIN S, TOTAL: Protein S Ag, Total: 139 % (ref 60–150)

## 2022-12-10 LAB — BETA-2-GLYCOPROTEIN I ABS, IGG/M/A
Beta-2 Glyco I IgG: <9 GPI IgG units (ref 0–20)
Beta-2-Glycoprotein I IgA: <9 GPI IgA units (ref 0–25)
Beta-2-Glycoprotein I IgM: <9 GPI IgM units (ref 0–32)

## 2022-12-10 LAB — PROTEIN C ACTIVITY: Protein C Activity: 166 % (ref 73–180)

## 2022-12-10 LAB — DRVVT CONFIRM: dRVVT Confirm: 1 ratio (ref 0.8–1.2)

## 2022-12-11 LAB — PROTEIN C, TOTAL: Protein C, Total: 153 % — ABNORMAL HIGH (ref 60–150)

## 2022-12-18 ENCOUNTER — Ambulatory Visit (HOSPITAL_COMMUNITY): Payer: BC Managed Care – PPO

## 2022-12-18 ENCOUNTER — Ambulatory Visit: Payer: BC Managed Care – PPO | Admitting: Nurse Practitioner

## 2022-12-18 NOTE — Progress Notes (Signed)
Synopsis: Referred for LLL mass by [name], NP  Subjective:   PATIENT ID: Brandon Phillips. GENDER: male DOB: 02-26-1966, MRN: [id number]  Chief Complaint  Patient presents with   Follow-up    Still coughing up some yellow sputum. His breathing has improved. He is using albuterol neb daily. Recent hospital admission 12/04/22 for PE- started on Eliquis.    57yM with history of 79 y 2.5ppd smoking referred for LLL mass, pet avid hilar lad, RUL 8mm nodule low level metabolic activity  He says that he has COPD and did screening CT Chest and then PET/CT. He does have chronic cough. No hemoptysis. He has been on symbicort for about a year but doesn't notice much effect from it. He hasn't needed prednisone ever for trouble breathing.     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 No longer smoking cigarettes. Working his way down with vaping.   Interval HPI:  Since last visit a couple bouts of AECOPD/bronchitis and found to have small SSPE RLL, US DVT negative  Started on eliquis  He is doing ok now. Still has quite a bit of chest congestion. Still using breztri 2 puff bid.    Otherwise pertinent review of systems is negative.  Past Medical History:  Diagnosis Date   Cancer (HCC)    lung cancer   CAP (community acquired pneumonia)    COPD (chronic obstructive pulmonary disease) (HCC)    Eczema    HLD (hyperlipidemia)    Primary cancer of left lower lobe of lung (HCC) 09/17/2022   Tobacco use disorder 01/24/2017     No family history on file.   Past Surgical History:  Procedure Laterality Date   BRONCHIAL BIOPSY  08/29/2022   Procedure: BRONCHIAL BIOPSIES;  Surgeon: [name], MD;  Location: [facility] ENDOSCOPY;  Service: Pulmonary;;   BRONCHIAL BRUSHINGS  08/29/2022   Procedure: BRONCHIAL BRUSHINGS;  Surgeon: [name], MD;  Location: [facility] ENDOSCOPY;  Service: Pulmonary;;   BRONCHIAL NEEDLE  ASPIRATION BIOPSY  08/29/2022   Procedure: BRONCHIAL NEEDLE ASPIRATION BIOPSIES;  Surgeon: [name], MD;  Location: [facility] ENDOSCOPY;  Service: Pulmonary;;   BRONCHIAL WASHINGS  08/29/2022   Procedure: BRONCHIAL WASHINGS;  Surgeon: [name], MD;  Location: [facility] ENDOSCOPY;  Service: Pulmonary;;   FIDUCIAL MARKER PLACEMENT  08/29/2022   Procedure: FIDUCIAL MARKER PLACEMENT;  Surgeon: [name], MD;  Location: [facility] ENDOSCOPY;  Service: Pulmonary;;   HERNIA REPAIR     as an infant   IR IMAGING GUIDED PORT INSERTION  10/03/2022   IR RADIOLOGIST EVAL & MGMT  10/21/2022   VIDEO BRONCHOSCOPY WITH ENDOBRONCHIAL ULTRASOUND  08/29/2022   Procedure: VIDEO BRONCHOSCOPY WITH ENDOBRONCHIAL ULTRASOUND;  Surgeon: [name], MD;  Location: [facility] ENDOSCOPY;  Service: Pulmonary;;    Social History   Socioeconomic History   Marital status: Married    Spouse name: Not on file   Number of children: Not on file   Years of education: Not on file   Highest education level: Not on file  Occupational History   Not on file  Tobacco Use   Smoking status: Former    Packs/day: 1.50    Years: 35.00    Additional pack years: 0.00    Total pack years: 52.50    Types: Cigarettes    Quit date: 03/30/2017    Years since quitting: 5.7   Smokeless tobacco: Never  Vaping Use   Vaping Use:  Former   Quit date: 08/23/2022   Substances: Nicotine  Substance and Sexual Activity   Alcohol use: No   Drug use: No   Sexual activity: Yes  Other Topics Concern   Not on file  Social History Narrative   Not on file   Social Determinants of Health   Financial Resource Strain: Not on file  Food Insecurity: No Food Insecurity (12/05/2022)   Hunger Vital Sign    Worried About Running Out of Food in the Last Year: Never true    Ran Out of Food in the Last Year: Never true  Transportation Needs: No Transportation Needs (12/05/2022)   PRAPARE - Administrator, Civil Service (Medical): No    Lack of  Transportation (Non-Medical): No  Physical Activity: Not on file  Stress: Not on file  Social Connections: Not on file  Intimate Partner Violence: Not At Risk (12/05/2022)   Humiliation, Afraid, Rape, and Kick questionnaire    Fear of Current or Ex-Partner: No    Emotionally Abused: No    Physically Abused: No    Sexually Abused: No     No Known Allergies   Outpatient Medications Prior to Visit  Medication Sig Dispense Refill   albuterol (PROVENTIL) (2.5 MG/3ML) 0.083% nebulizer solution Take 3 mLs (2.5 mg total) by nebulization every 6 (six) hours as needed for wheezing or shortness of breath. 75 mL 5   albuterol (VENTOLIN HFA) 108 (90 Base) MCG/ACT inhaler Inhale 2 puffs into the lungs every 6 (six) hours as needed for wheezing. 2 each 11   APIXABAN (ELIQUIS) VTE STARTER PACK (10MG  AND 5MG ) Take as directed on package: start with two-5mg  tablets twice daily for 7 days. On day 8, switch to one-5mg  tablet twice daily. 74 each 0   atorvastatin (LIPITOR) 20 MG tablet TAKE ONE TABLET BY MOUTH DAILY 90 tablet 3   benzonatate (TESSALON) 200 MG capsule Take 1 capsule (200 mg total) by mouth 3 (three) times daily as needed for cough. 30 capsule 1   Budeson-Glycopyrrol-Formoterol (BREZTRI AEROSPHERE) 160-9-4.8 MCG/ACT AERO Inhale 2 puffs into the lungs in the morning and at bedtime. 1 each 11   COENZYME Q10 PO Take 100 mg by mouth in the morning.     GUAIFENESIN 1200 PO Take 1,200 mg by mouth in the morning and at bedtime.     lidocaine-prilocaine (EMLA) cream Apply a dime size to port-a-cath 1-2 hours prior to access. Cover with saran wrap. 30 g 2   Multiple Vitamin (MULTIVITAMIN) capsule Take 1 capsule by mouth daily.     telmisartan (MICARDIS) 40 MG tablet Take 1 tablet (40 mg total) by mouth daily. 30 tablet 4   No facility-administered medications prior to visit.       Objective:   Physical Exam:  General appearance: 57 y.o., male, male, NAD, conversant  Eyes: anicteric sclerae; PERRL,  tracking appropriately HENT: NCAT; MMM Neck: Trachea midline; no lymphadenopathy, no JVD Lungs: diminished bl, with normal respiratory effort CV: RRR, no murmur  Abdomen: Soft, non-tender; non-distended, BS present  Extremities: No peripheral edema, warm Skin: Normal turgor and texture; no rash Psych: Appropriate affect Neuro: Alert and oriented to person and place, no focal deficit     Vitals:   12/19/22 1518  BP: 112/66  Pulse: 86  Temp: 97.7 F (36.5 C)  TempSrc: Oral  SpO2: 96%  Weight: 172 lb (78 kg)  Height: 5\' 5"  (1.651 m)     96% on RA BMI Readings from Last 3  Encounters:  12/19/22 28.62 kg/m  12/09/22 28.29 kg/m  12/05/22 26.91 kg/m   Wt Readings from Last 3 Encounters:  12/19/22 172 lb (78 kg)  12/09/22 170 lb (77.1 kg)  12/05/22 161 lb 11.2 oz (73.3 kg)     CBC    Component Value Date/Time   WBC 10.9 (H) 12/09/2022 0856   WBC 9.4 12/05/2022 0640   RBC 4.16 (L) 12/09/2022 0856   HGB 13.4 12/09/2022 0856   HCT 38.8 (L) 12/09/2022 0856   PLT 201 12/09/2022 0856   MCV 93.3 12/09/2022 0856   MCH 32.2 12/09/2022 0856   MCHC 34.5 12/09/2022 0856   RDW 19.7 (H) 12/09/2022 0856   LYMPHSABS 0.8 12/09/2022 0856   MONOABS 0.7 12/09/2022 0856   EOSABS 0.0 12/09/2022 0856   BASOSABS 0.0 12/09/2022 0856     Chest Imaging: PET/CT 08/14/22 with hypermetabolic LLL mass, low level hypermetabolism SUV 1.6 RUL 8mm nodule, posterior left hilar hypermetabolism  CTA Chest 12/04/22 with small RLL SSPE, smaller LLL tumor  Pulmonary Functions Testing Results:    Latest Ref Rng & Units 08/26/2022    3:25 PM  PFT Results  FVC-Pre L 2.75   FVC-Predicted Pre % 66   FVC-Post L 2.77   FVC-Predicted Post % 67   Pre FEV1/FVC % % 48   Post FEV1/FCV % % 48   FEV1-Pre L 1.33   FEV1-Predicted Pre % 42   FEV1-Post L 1.33   DLCO uncorrected ml/min/mmHg 15.07   DLCO UNC% % 61   DLCO corrected ml/min/mmHg 14.04   DLCO COR %Predicted % 57   DLVA Predicted % 60   TLC L  7.30   TLC % Predicted % 122   RV % Predicted % 235    PFT 08/26/22 with severe obstruction, hyperinflation, air trapping, moderately reduced diffusing capacity  TTE 09/10/22:  1. Mild intracavitary gradient. Peak velocity 0.96 m/s. Peak gradient 3.7  mmHg. Left ventricular ejection fraction, by estimation, is 65 to 70%. The  left ventricle has normal function. The left ventricle has no regional  wall motion abnormalities. Left  ventricular diastolic parameters were normal. The average left ventricular  global longitudinal strain is -19.4 %. The global longitudinal strain is  normal.   2. Right ventricular systolic function is normal. The right ventricular  size is normal. There is normal pulmonary artery systolic pressure.   3. The mitral valve is normal in structure. No evidence of mitral valve  regurgitation. No evidence of mitral stenosis.   4. The aortic valve is tricuspid. Aortic valve regurgitation is not  visualized. No aortic stenosis is present.   5. The inferior vena cava is normal in size with greater than 50%  respiratory variability, suggesting right atrial pressure of 3 mmHg.     Assessment & Plan:   # Stage IIB LLL NSCLC # RUL weakly avid 8mm nodule  # History of smoking  # COPD gold d # severe obstruction, hyperinflation, air trapping, moderately reduced diffusing capacity  # RLL SSPE  Plan: - has upcoming restaging PET/CT - breztri 2 puffs twice daily, rinse mouth and brush teeth/tongue after  - albuterol as needed - if chest congestion then mucinex [phone] mg twice daily, flutter valve 10 slow but firm puffs twice daily after each treatment with breztri - continue eliquis 5 bid per heme/onc - see you in 8 weeks or sooner if need be!     [name], MD [facility] 12/19/2022 4:40 PM

## 2022-12-19 ENCOUNTER — Encounter: Payer: Self-pay | Admitting: Student

## 2022-12-19 ENCOUNTER — Ambulatory Visit (INDEPENDENT_AMBULATORY_CARE_PROVIDER_SITE_OTHER): Payer: BC Managed Care – PPO | Admitting: Student

## 2022-12-19 ENCOUNTER — Other Ambulatory Visit (HOSPITAL_COMMUNITY): Payer: BC Managed Care – PPO

## 2022-12-19 VITALS — BP 112/66 | HR 86 | Temp 97.7°F | Ht 65.0 in | Wt 172.0 lb

## 2022-12-19 DIAGNOSIS — R490 Dysphonia: Secondary | ICD-10-CM | POA: Diagnosis not present

## 2022-12-19 DIAGNOSIS — E785 Hyperlipidemia, unspecified: Secondary | ICD-10-CM | POA: Diagnosis not present

## 2022-12-19 DIAGNOSIS — I2693 Single subsegmental pulmonary embolism without acute cor pulmonale: Secondary | ICD-10-CM | POA: Diagnosis not present

## 2022-12-19 DIAGNOSIS — J449 Chronic obstructive pulmonary disease, unspecified: Secondary | ICD-10-CM | POA: Diagnosis not present

## 2022-12-19 NOTE — Patient Instructions (Addendum)
-   would stay on breztri 2 puff twice daily - can resume symbicort if you want to see if breztri may be playing some role in this cough - if chest congestion then mucinex [phone] mg twice daily, flutter valve 10 slow but firm puffs twice daily after each treatment with breztri - continue eliquis twice daily  - albuterol as needed

## 2022-12-20 ENCOUNTER — Other Ambulatory Visit: Payer: Self-pay

## 2022-12-26 ENCOUNTER — Ambulatory Visit: Payer: BC Managed Care – PPO | Admitting: Hematology & Oncology

## 2022-12-26 ENCOUNTER — Inpatient Hospital Stay: Payer: BC Managed Care – PPO

## 2022-12-26 ENCOUNTER — Other Ambulatory Visit: Payer: BC Managed Care – PPO

## 2022-12-27 LAB — PROTHROMBIN GENE MUTATION

## 2022-12-27 NOTE — Progress Notes (Signed)
  Radiation Oncology         [phone]) [phone] ________________________________  Name: Brandon Phillips. MRN: [id number]  Date of Service: 12/30/2022  DOB: 07-02-1966  Post Treatment Telephone Note  Diagnosis:  Stage III, cT3N1M0, NSCLC, adenocarcinoma of the LLL. (as documented in provider EOT note)   The patient was available for call today.   Symptoms of fatigue have improved since completing therapy.  Symptoms of skin changes have improved since completing therapy.  Symptoms of esophagitis have improved since completing therapy.   The patient has scheduled follow up with his medical oncologist Dr. Myna Hidalgo for ongoing care, and was encouraged to call if he develops concerns or questions regarding radiation.   This concludes the interview.   Ruel Favors, LPN

## 2022-12-30 ENCOUNTER — Ambulatory Visit
Admission: RE | Admit: 2022-12-30 | Discharge: 2022-12-30 | Disposition: A | Discharge status: Home / Self Care | Payer: BC Managed Care – PPO | Source: Ambulatory Visit | Attending: Radiation Oncology | Admitting: Radiation Oncology

## 2022-12-30 ENCOUNTER — Encounter: Payer: Self-pay | Admitting: Hematology & Oncology

## 2022-12-30 ENCOUNTER — Other Ambulatory Visit: Payer: Self-pay

## 2022-12-30 MED ORDER — APIXABAN 5 MG PO TABS: 5.0000 mg | ORAL_TABLET | Freq: Two times a day (BID) | ORAL | 12 refills | Status: DC

## 2022-12-31 ENCOUNTER — Encounter: Payer: Self-pay | Admitting: *Deleted

## 2022-12-31 LAB — FACTOR 5 LEIDEN

## 2022-12-31 NOTE — Progress Notes (Signed)
Reviewed chart and patient PET had received authorization until 01/11/2023. Called and scheduled PET. The only available appointment prior to 01/11/2023 is tomorrow at 2pm.  Called and spoke to patient's wife. After checking with her husband, appointment for tomorrow is confirmed.   Patient's wife [name], is aware of PET appointment including date, time, and location. The following prep is reviewed with patient and confirmed with teachback: - arrive 30 minutes before appointment time - NPO except water for 6h before scan. No candy, no gum - hold any diabetic medication the morning of the scan - have a low carb dinner the night prior  Oncology Nurse Navigator Documentation     12/31/2022   10:45 AM  Oncology Nurse Navigator Flowsheets  Navigator Follow Up Date: 01/01/2023  Navigator Follow Up Reason: Scan Review  Navigator Location [facility]  Navigator Encounter Type Appt/Treatment Plan Review;Telephone  Telephone Appt Confirmation/Clarification;Education;Outgoing Call  Patient Visit Type MedOnc  Treatment Phase Active Tx  Barriers/Navigation Needs Coordination of Care;Education  Education Other  Interventions Coordination of Care;Education  Acuity Level 2-Minimal Needs (1-2 Barriers Identified)  Coordination of Care Radiology  Education Method Verbal;Teach-back  Support Groups/Services Friends and Family  Time Spent with Patient 30

## 2023-01-01 ENCOUNTER — Encounter (HOSPITAL_COMMUNITY)
Admission: RE | Admit: 2023-01-01 | Discharge: 2023-01-01 | Disposition: A | Discharge status: Home / Self Care | Payer: BC Managed Care – PPO | Source: Ambulatory Visit | Attending: Hematology & Oncology | Admitting: Hematology & Oncology

## 2023-01-01 ENCOUNTER — Ambulatory Visit: Payer: BC Managed Care – PPO | Admitting: Nurse Practitioner

## 2023-01-01 DIAGNOSIS — I2699 Other pulmonary embolism without acute cor pulmonale: Secondary | ICD-10-CM | POA: Insufficient documentation

## 2023-01-01 DIAGNOSIS — C3490 Malignant neoplasm of unspecified part of unspecified bronchus or lung: Secondary | ICD-10-CM | POA: Diagnosis not present

## 2023-01-01 DIAGNOSIS — R911 Solitary pulmonary nodule: Secondary | ICD-10-CM | POA: Diagnosis not present

## 2023-01-01 DIAGNOSIS — C3492 Malignant neoplasm of unspecified part of left bronchus or lung: Secondary | ICD-10-CM | POA: Diagnosis not present

## 2023-01-01 DIAGNOSIS — R590 Localized enlarged lymph nodes: Secondary | ICD-10-CM | POA: Insufficient documentation

## 2023-01-01 DIAGNOSIS — Z923 Personal history of irradiation: Secondary | ICD-10-CM | POA: Insufficient documentation

## 2023-01-01 DIAGNOSIS — Z9221 Personal history of antineoplastic chemotherapy: Secondary | ICD-10-CM | POA: Diagnosis not present

## 2023-01-01 LAB — GLUCOSE, CAPILLARY: Glucose-Capillary: 95 mg/dL (ref 70–99)

## 2023-01-01 MED ORDER — FLUDEOXYGLUCOSE F - 18 (FDG) INJECTION
7.2000 | Freq: Once | INTRAVENOUS | Status: AC
  Administered 2023-01-01: 8.5 via INTRAVENOUS

## 2023-01-02 ENCOUNTER — Encounter: Payer: Self-pay | Admitting: *Deleted

## 2023-01-02 NOTE — Progress Notes (Signed)
Post treatment PET reviewed. Will return on 6/24 to discuss maintenance treatment.   Oncology Nurse Navigator Documentation     01/02/2023   12:45 PM  Oncology Nurse Navigator Flowsheets  Phase of Treatment Chemo/Radiation Concurrent  Chemo/Radiation Concurrent Actual End Date: 11/11/2022  Navigator Follow Up Date: 01/20/2023  Navigator Follow Up Reason: Follow-up Appointment  Navigator Location [facility]  Navigator Encounter Type Scan Review  Patient Visit Type MedOnc  Treatment Phase Post-Tx Follow-up  Barriers/Navigation Needs Coordination of Care;Education  Interventions None Required  Acuity Level 2-Minimal Needs (1-2 Barriers Identified)  Support Groups/Services Friends and Family  Time Spent with Patient 15

## 2023-01-06 ENCOUNTER — Other Ambulatory Visit: Payer: Self-pay | Admitting: *Deleted

## 2023-01-06 DIAGNOSIS — R7989 Other specified abnormal findings of blood chemistry: Secondary | ICD-10-CM

## 2023-01-07 ENCOUNTER — Ambulatory Visit (INDEPENDENT_AMBULATORY_CARE_PROVIDER_SITE_OTHER): Payer: BC Managed Care – PPO | Admitting: Medical-Surgical

## 2023-01-07 ENCOUNTER — Encounter: Payer: Self-pay | Admitting: Medical-Surgical

## 2023-01-07 VITALS — BP 121/76 | HR 87 | Resp 20 | Ht 65.0 in | Wt 161.0 lb

## 2023-01-07 DIAGNOSIS — I2699 Other pulmonary embolism without acute cor pulmonale: Secondary | ICD-10-CM | POA: Diagnosis not present

## 2023-01-07 DIAGNOSIS — E782 Mixed hyperlipidemia: Secondary | ICD-10-CM | POA: Diagnosis not present

## 2023-01-07 DIAGNOSIS — M792 Neuralgia and neuritis, unspecified: Secondary | ICD-10-CM

## 2023-01-07 DIAGNOSIS — I10 Essential (primary) hypertension: Secondary | ICD-10-CM | POA: Diagnosis not present

## 2023-01-07 MED ORDER — GABAPENTIN 300 MG PO CAPS: ORAL_CAPSULE | ORAL | 3 refills | Status: DC

## 2023-01-07 MED ORDER — ATORVASTATIN CALCIUM 20 MG PO TABS
ORAL_TABLET | ORAL | 3 refills | Status: DC
Start: 2023-01-07 — End: 2023-10-14

## 2023-01-07 NOTE — Progress Notes (Signed)
        Established patient visit  History, exam, impression, and plan:  1. Mixed hyperlipidemia Very pleasant 57 year old male presenting today with a history of hyperlipidemia.  He is currently taking atorvastatin 20 mg daily, tolerating well without side effects.  Not consistently following a low fat heart healthy diet and not exercising.  He does stay physically active with work.  Last lipids checked in June 2023 so is due today.  He does have a Port-A-Cath placed and will be going to his oncologist for labs soon.  Lipid panel ordered today and advised him to see if oncology will be able to add this on at his next blood draw.  For now continue atorvastatin 20 mg daily. - atorvastatin (LIPITOR) 20 MG tablet; TAKE ONE TABLET BY MOUTH DAILY  Dispense: 90 tablet; Refill: 3 - Lipid panel  2. Hypertension goal BP (blood pressure) < 130/80 Was started on telmisartan 40 mg daily by oncology due to repeated elevations in blood pressure.  Taking medication as prescribed, tolerating well without side effects.  Blood pressures have been at goal on recent appointments.  Endorses shortness of breath given the nature of his cancer.  No lower extremity edema, headaches, dizziness, palpitations, or syncopal episodes.  HRRR, S1/S2 normal.  No peripheral edema.  Respirations even and unlabored.  3. Other pulmonary embolism without acute cor pulmonale, unspecified chronicity (HCC) Currently on Eliquis 5 mg twice daily for PE that was discovered by pulmonology.  Doing well on the blood thinners and is aware of recommendations for bleeding precautions.  4.  Neuropathic pain Is having pain in the back extending around into the chest that feels like it is very superficial.  Recently started and has not been responding to Tylenol.  NSAIDs contraindicated due to blood thinners.  Reports it feels like a nerve type of pain.  Has not tried any other medications or treatments to date.  Starting gabapentin 300 mg nightly for  3 days then 300 mg twice daily.  After 1 week, if desired okay to increase to 300 mg 3 times daily.  Instructed to let me know via phone call or MyChart if the gabapentin is working well for him or if we need to look at dose change or even medication change.   Procedures performed this visit: None.  Return in about 6 months (around 07/09/2023) for HLD/HTN follow up.  __________________________________ Thayer Ohm, DNP, APRN, FNP-BC Primary Care and Sports Medicine [facility]

## 2023-01-08 ENCOUNTER — Other Ambulatory Visit: Payer: Self-pay

## 2023-01-20 ENCOUNTER — Inpatient Hospital Stay: Payer: BC Managed Care – PPO

## 2023-01-20 ENCOUNTER — Inpatient Hospital Stay (HOSPITAL_BASED_OUTPATIENT_CLINIC_OR_DEPARTMENT_OTHER): Payer: BC Managed Care – PPO | Admitting: Hematology & Oncology

## 2023-01-20 ENCOUNTER — Other Ambulatory Visit: Payer: Self-pay

## 2023-01-20 ENCOUNTER — Inpatient Hospital Stay: Payer: BC Managed Care – PPO | Attending: Hematology & Oncology

## 2023-01-20 ENCOUNTER — Encounter: Payer: Self-pay | Admitting: Hematology & Oncology

## 2023-01-20 DIAGNOSIS — Z9221 Personal history of antineoplastic chemotherapy: Secondary | ICD-10-CM | POA: Diagnosis not present

## 2023-01-20 DIAGNOSIS — C3432 Malignant neoplasm of lower lobe, left bronchus or lung: Secondary | ICD-10-CM | POA: Diagnosis not present

## 2023-01-20 DIAGNOSIS — Z79899 Other long term (current) drug therapy: Secondary | ICD-10-CM | POA: Diagnosis not present

## 2023-01-20 DIAGNOSIS — Z79620 Long term (current) use of immunosuppressive biologic: Secondary | ICD-10-CM | POA: Diagnosis not present

## 2023-01-20 DIAGNOSIS — Z923 Personal history of irradiation: Secondary | ICD-10-CM | POA: Insufficient documentation

## 2023-01-20 DIAGNOSIS — I2693 Single subsegmental pulmonary embolism without acute cor pulmonale: Secondary | ICD-10-CM | POA: Insufficient documentation

## 2023-01-20 DIAGNOSIS — Z7901 Long term (current) use of anticoagulants: Secondary | ICD-10-CM | POA: Insufficient documentation

## 2023-01-20 DIAGNOSIS — I2699 Other pulmonary embolism without acute cor pulmonale: Secondary | ICD-10-CM

## 2023-01-20 DIAGNOSIS — J449 Chronic obstructive pulmonary disease, unspecified: Secondary | ICD-10-CM

## 2023-01-20 DIAGNOSIS — Z87891 Personal history of nicotine dependence: Secondary | ICD-10-CM | POA: Insufficient documentation

## 2023-01-20 LAB — TSH: TSH: 2.587 u[IU]/mL (ref 0.350–4.500)

## 2023-01-20 LAB — CMP (CANCER CENTER ONLY)
ALT: 34 U/L (ref 0–44)
AST: 18 U/L (ref 15–41)
Albumin: 4.3 g/dL (ref 3.5–5.0)
Alkaline Phosphatase: 48 U/L (ref 38–126)
Anion gap: 10 (ref 5–15)
BUN: 12 mg/dL (ref 6–20)
CO2: 28 mmol/L (ref 22–32)
Calcium: 9.5 mg/dL (ref 8.9–10.3)
Chloride: 100 mmol/L (ref 98–111)
Creatinine: 0.98 mg/dL (ref 0.61–1.24)
GFR, Estimated: >60 mL/min (ref >60)
Glucose, Bld: 99 mg/dL (ref 70–99)
Potassium: 3.8 mmol/L (ref 3.5–5.1)
Sodium: 138 mmol/L (ref 135–145)
Total Bilirubin: 0.4 mg/dL (ref 0.3–1.2)
Total Protein: 6.9 g/dL (ref 6.5–8.1)

## 2023-01-20 LAB — CBC WITH DIFFERENTIAL (CANCER CENTER ONLY)
Abs Immature Granulocytes: 0.03 10*3/uL (ref 0.00–0.07)
Basophils Absolute: 0.1 10*3/uL (ref 0.0–0.1)
Basophils Relative: 1 %
Eosinophils Absolute: 0.1 10*3/uL (ref 0.0–0.5)
Eosinophils Relative: 2 %
HCT: 43 % (ref 39.0–52.0)
Hemoglobin: 14.5 g/dL (ref 13.0–17.0)
Immature Granulocytes: 0 %
Lymphocytes Relative: 11 %
Lymphs Abs: 0.7 10*3/uL (ref 0.7–4.0)
MCH: 31.8 pg (ref 26.0–34.0)
MCHC: 33.7 g/dL (ref 30.0–36.0)
MCV: 94.3 fL (ref 80.0–100.0)
Monocytes Absolute: 0.7 10*3/uL (ref 0.1–1.0)
Monocytes Relative: 10 %
Neutro Abs: 5.1 10*3/uL (ref 1.7–7.7)
Neutrophils Relative %: 76 %
Platelet Count: 199 10*3/uL (ref 150–400)
RBC: 4.56 MIL/uL (ref 4.22–5.81)
RDW: 13.2 % (ref 11.5–15.5)
WBC Count: 6.7 10*3/uL (ref 4.0–10.5)
nRBC: 0 % (ref 0.0–0.2)

## 2023-01-20 LAB — LACTATE DEHYDROGENASE: LDH: 161 U/L (ref 98–192)

## 2023-01-20 NOTE — Progress Notes (Signed)
Hematology and Oncology Follow Up Visit  Brandon Phillips [id number] [birth year] 57 y.o. 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 twice daily-start on 12/06/2022     Interim History:  Brandon Phillips is back for follow-up.  We did go ahead and do a PET scan on him.  This was done back on 01/01/2023.  The PET scan still showed significant activity in the left lower lung nodule.  This was improved from the pretreatment PET scan.  However, I still believe that there is significant active disease.  On the PET scan, the nodule measured 2.4 x 1.8 cm.  Previously was 3.3 x 2.4 cm.  It had SUV of 8.6.  There was a left hilar lymph node that measured 3.3 SUV.  I am not sure if this really is significant.  At this point, I think that we probably need to think about some kind of local therapy.  He has significantly decreased lung function so I do not think that surgery would be a possibility.  I thought 1 option might be radiosurgery.  I realize that he has had radiation therapy already.  I did speak with Dr. Kathrynn Running of Radiation Oncology.  He looked at the PET scan and thought that radiosurgery could be a possibility.  If, for some reason, he cannot have radiosurgery, I probably would be left with chemotherapy and immunotherapy.  He is post to start immunotherapy today.  I would like to hold off on this until we get the nodule treated.  He does have the pulmonary embolism.  He is on Eliquis.  I will keep him on Eliquis for right now.  I would like to hope that the pulmonary embolism has resolved.  There has been no pain.  His appetite has been doing pretty well.  He has had no cough.  Is been no hemoptysis.  Currently, I would say that his performance status is ECOG 1.     Medications:  Current Outpatient Medications:    albuterol (PROVENTIL) (2.5 MG/3ML) 0.083% nebulizer solution, Take 3 mLs (2.5 mg total) by nebulization every 6 (six) hours as needed for wheezing or shortness of breath., Disp: 75 mL, Rfl: 5   albuterol (VENTOLIN HFA) 108 (90 Base) MCG/ACT inhaler, Inhale 2 puffs into the lungs every 6 (six) hours as needed for wheezing., Disp: 2 each, Rfl: 11   apixaban (ELIQUIS) 5 MG TABS tablet, Take 1 tablet (5 mg total) by mouth 2 (two) times daily., Disp: 60 tablet, Rfl: 12   APIXABAN (ELIQUIS) VTE STARTER PACK (10MG  AND 5MG ), Take as directed on package: start with two-5mg  tablets twice daily for 7 days. On day 8, switch to one-5mg  tablet twice daily., Disp: 74 each, Rfl: 0   atorvastatin (LIPITOR) 20 MG tablet, TAKE ONE TABLET BY MOUTH DAILY, Disp: 90 tablet, Rfl: 3   benzonatate (TESSALON) 200 MG capsule, Take 1 capsule (200 mg total) by mouth 3 (three) times daily as needed for cough., Disp: 30 capsule, Rfl: 1   Budeson-Glycopyrrol-Formoterol (BREZTRI AEROSPHERE) 160-9-4.8 MCG/ACT AERO, Inhale 2 puffs into the lungs in the morning and at bedtime., Disp: 1 each, Rfl: 11   COENZYME Q10 PO, Take 100 mg by mouth in the morning., Disp: , Rfl:    gabapentin (NEURONTIN) 300 MG capsule, Take  300mg  nightly for 3 nights then increase to 300mg  twice daily. After 1 week, ok to increase to 300mg  three times daily., Disp: 90 capsule, Rfl: 3   GUAIFENESIN 1200 PO, Take 1,200 mg by mouth in the morning and at bedtime., Disp: , Rfl:    lidocaine-prilocaine (EMLA) cream, Apply a dime size to port-a-cath 1-2 hours prior to access. Cover with saran wrap., Disp: 30 g, Rfl: 2   Multiple Vitamin (MULTIVITAMIN) capsule, Take 1 capsule by mouth daily., Disp: , Rfl:    telmisartan (MICARDIS) 40 MG tablet, Take 1 tablet (40 mg total) by mouth daily., Disp: 30 tablet, Rfl: 4  Allergies: No Known Allergies  Past Medical History, Surgical history, Social history, and Family  History were reviewed and updated.  Review of Systems: Review of Systems  Constitutional: Negative.   HENT:  Negative.    Eyes: Negative.   Respiratory:  Positive for cough.   Cardiovascular: Negative.   Gastrointestinal: Negative.   Endocrine: Negative.   Genitourinary: Negative.    Musculoskeletal: Negative.   Skin: Negative.   Neurological: Negative.   Hematological: Negative.   Psychiatric/Behavioral: Negative.      Physical Exam: His vital signs are temperature 98.2.  Pulse 73.  Blood pressure 124/89.  Weight is 168 pounds.  Wt Readings from Last 3 Encounters:  01/07/23 161 lb (73 kg)  12/19/22 172 lb (78 kg)  12/09/22 170 lb (77.1 kg)    Physical Exam Vitals reviewed.  HENT:     Head: Normocephalic and atraumatic.  Eyes:     Pupils: Pupils are equal, round, and reactive to light.  Cardiovascular:     Rate and Rhythm: Normal rate and regular rhythm.     Heart sounds: Normal heart sounds.  Pulmonary:     Effort: Pulmonary effort is normal.     Breath sounds: Normal breath sounds.  Abdominal:     General: Bowel sounds are normal.     Palpations: Abdomen is soft.  Musculoskeletal:        General: No tenderness or deformity. Normal range of motion.     Cervical back: Normal range of motion.  Lymphadenopathy:     Cervical: No cervical adenopathy.  Skin:    General: Skin is warm and dry.     Findings: No erythema or rash.  Neurological:     Mental Status: He is alert and oriented to person, place, and time.  Psychiatric:        Behavior: Behavior normal.        Thought Content: Thought content normal.        Judgment: Judgment normal.    Lab Results  Component Value Date   WBC 10.9 (H) 12/09/2022   HGB 13.4 12/09/2022   HCT 38.8 (L) 12/09/2022   MCV 93.3 12/09/2022   PLT 201 12/09/2022     Chemistry      Component Value Date/Time   NA 132 (L) 12/09/2022 0856   K 3.8 12/09/2022 0856   CL 97 (L) 12/09/2022 0856   CO2 23 12/09/2022 0856   BUN 18  12/09/2022 0856   CREATININE 1.12 12/09/2022 0856   CREATININE 1.05 01/11/2022 0000      Component Value Date/Time   CALCIUM 9.3 12/09/2022 0856   ALKPHOS 41 12/09/2022 0856   AST 15 12/09/2022 0856   ALT 41 12/09/2022 0856   BILITOT 0.5 12/09/2022 0856      Impression and Plan: Brandon Phillips is a very nice 57 year old white male.  He has  a 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  Again, he does not wish to have surgery.  He has seen Dr. Dorris Fetch of thoracic surgery.  He completed radiation/chemotherapy.  He has responded but still, I think, there is significant disease.  Hopefully, radiosurgery can help eliminate what is there.  I still would like to use immunotherapy.  We may have to actually think about chemoimmunotherapy for this residual disease.  He will speak with Dr. Kathrynn Running.  Hopefully everything can be set up in about a week or so.  I would think that this would be 3 days of therapy.  I like to see him back in about 3 weeks.  At that point we can certainly start maintenance immunotherapy with Durvalumab.   [name], MD 6/24/20249:26 AM

## 2023-01-20 NOTE — Patient Instructions (Signed)

## 2023-01-21 ENCOUNTER — Encounter: Payer: Self-pay | Admitting: *Deleted

## 2023-01-21 LAB — T4: T4, Total: 6.6 ug/dL (ref 4.5–12.0)

## 2023-01-21 NOTE — Progress Notes (Signed)
Patient was seen to discuss  recent PET. He was due to start maintenance, however with continued active disease, Dr Myna Hidalgo has requested consultation with RadOnc for radiosurgery. Will await that consult to determine next treatment plan.   Oncology Nurse Navigator Documentation     01/21/2023    9:30 AM  Oncology Nurse Navigator Flowsheets  Navigator Follow Up Date: 01/22/2023  Navigator Follow Up Reason: Review Note  Navigator Location [facility]  Navigator Encounter Type Appt/Treatment Plan Review  Patient Visit Type MedOnc  Treatment Phase Post-Tx Follow-up  Barriers/Navigation Needs Coordination of Care;Education  Interventions None Required  Acuity Level 2-Minimal Needs (1-2 Barriers Identified)  Support Groups/Services Friends and Family  Time Spent with Patient 15

## 2023-01-22 ENCOUNTER — Other Ambulatory Visit: Payer: Self-pay

## 2023-01-22 ENCOUNTER — Encounter: Payer: Self-pay | Admitting: *Deleted

## 2023-01-22 ENCOUNTER — Encounter: Payer: Self-pay | Admitting: Radiation Oncology

## 2023-01-22 ENCOUNTER — Ambulatory Visit
Admission: RE | Admit: 2023-01-22 | Discharge: 2023-01-22 | Disposition: A | Discharge status: Home / Self Care | Payer: BC Managed Care – PPO | Source: Ambulatory Visit | Attending: Radiation Oncology | Admitting: Radiation Oncology

## 2023-01-22 VITALS — BP 106/72 | HR 90 | Temp 97.3°F | Resp 18 | Ht 65.0 in | Wt 175.6 lb

## 2023-01-22 DIAGNOSIS — J90 Pleural effusion, not elsewhere classified: Secondary | ICD-10-CM | POA: Diagnosis not present

## 2023-01-22 DIAGNOSIS — Z9221 Personal history of antineoplastic chemotherapy: Secondary | ICD-10-CM | POA: Insufficient documentation

## 2023-01-22 DIAGNOSIS — E785 Hyperlipidemia, unspecified: Secondary | ICD-10-CM | POA: Insufficient documentation

## 2023-01-22 DIAGNOSIS — Z87891 Personal history of nicotine dependence: Secondary | ICD-10-CM | POA: Diagnosis not present

## 2023-01-22 DIAGNOSIS — C3432 Malignant neoplasm of lower lobe, left bronchus or lung: Secondary | ICD-10-CM | POA: Diagnosis not present

## 2023-01-22 DIAGNOSIS — J449 Chronic obstructive pulmonary disease, unspecified: Secondary | ICD-10-CM | POA: Diagnosis not present

## 2023-01-22 DIAGNOSIS — Z7901 Long term (current) use of anticoagulants: Secondary | ICD-10-CM | POA: Insufficient documentation

## 2023-01-22 DIAGNOSIS — Z79899 Other long term (current) drug therapy: Secondary | ICD-10-CM | POA: Diagnosis not present

## 2023-01-22 DIAGNOSIS — Z923 Personal history of irradiation: Secondary | ICD-10-CM | POA: Diagnosis not present

## 2023-01-22 NOTE — Progress Notes (Addendum)
Nursing Reconsult interview for Malignant neoplasm of lower lobe of left lung (HCC). Patient identity verified x2.  Patient reports occasional sob w/ exertion but otherwise denies any discomfort at this time. Patient is recovering from a blood clot found in LT lung on 12/04/2022 w/ the help of Gabapentin prescribed by PCP [name], NP and Eliquis prescribed by oncologist Dr. Arlan Organ.  Meaningful use complete.  Vitals- BP 106/72 (BP Location: Right Arm, Patient Position: Sitting, Cuff Size: Large)   Pulse 90   Temp (!) 97.3 F (36.3 C) (Temporal)   Resp 18   Ht 5\' 5"  (1.651 m)   Wt 175 lb 9.6 oz (79.7 kg)   SpO2 94%   BMI 29.22 kg/m   This concludes the interaction.  Ruel Favors, LPN

## 2023-01-22 NOTE — Progress Notes (Signed)
Radiation Oncology         [phone]) [phone] ________________________________  Name: Brandon Phillips.        MRN: [id number]  Date of Service: 01/22/2023 DOB: [birth year]  Phillips:YQMVHQ, Joy, NP  Brandon Macho, MD     REFERRING PHYSICIAN: [name], MD   DIAGNOSIS: The primary encounter diagnosis was Primary cancer of left lower lobe of lung (HCC). Diagnoses of Primary malignant neoplasm of left lower lobe of lung (HCC) and Malignant neoplasm of lower lobe of left lung Brandon Phillips) were also pertinent to this visit.   HISTORY OF PRESENT ILLNESS: Brandon Phillips. is a 57 y.o. male with a history of Stage III, cT3N1M0, NSCLC, adenocarcinoma of the LLL. He was found to have a LLL mass on his lung cancer screening imaging in the fall of 2023, and  PET scan on 08/14/22 showed a hypermetabolic area of activity in the left lower lobe, consistent with the previously visualized mass; posterior left hilar hypermetabolism, likely due to a small node; and nonspecific low-level activity in the previously visualized right upper lobe pulmonary nodule. MRI of the brain on 08/24/22 was negative for metastatic disease. A bronchoscopy on 08/29/22 revealed adenocarcinoma in the left lower lobe and atypical cells at the 11L lymph node. The right upper lung was negative for malignancy. He ultimately proceeded with chemoradiation which he completed in April 2024.   Since completing radiation, he has continued in surveillance.  His most recent CT chest on 12/04/2022 was performed due to shortness of breath and concerns for embolism.  This did in fact confirm a small subsegmental right lower lobe pulmonary embolism and interval decrease in the mass in the left lower lobe.  He was started on Eliquis. A repeat PET scan on 01/01/2023 measured the nodule in the left lower lobe as 2.4 cm in comparison to 2 to 3.3 cm previously but persistent intense uptake with an SUV of 8.6.  The left hilar lymph node had an SUV of 3.3, and  intense metabolic activity in the musculature of the right shoulder without CT lesion confirmation with an SUV of 6.3 was noted.  Given the concerns for the intensity of his SUV values in the primary tumor, he is seen to consider potential additional radiation.  Radiation treatment dates:     09/23/22-11/06/22 The patient was treated to the disease within the LLL lung initially to a dose of 60 Gy using a 4 field, 3-D conformal technique. The patient then received a cone down boost treatment for an additional 6 Gy. This yielded a final total dose of 66 Gy.    PAST MEDICAL HISTORY:  Past Medical History:  Diagnosis Date   Cancer (HCC)    lung cancer   CAP (community acquired pneumonia)    COPD (chronic obstructive pulmonary disease) (HCC)    Eczema    HLD (hyperlipidemia)    Primary cancer of left lower lobe of lung (HCC) 09/17/2022   Tobacco use disorder 01/24/2017       PAST SURGICAL HISTORY: Past Surgical History:  Procedure Laterality Date   BRONCHIAL BIOPSY  08/29/2022   Procedure: BRONCHIAL BIOPSIES;  Surgeon: [name], MD;  Location: [facility] ENDOSCOPY;  Service: Pulmonary;;   BRONCHIAL BRUSHINGS  08/29/2022   Procedure: BRONCHIAL BRUSHINGS;  Surgeon: [name], MD;  Location: [facility] ENDOSCOPY;  Service: Pulmonary;;   BRONCHIAL NEEDLE ASPIRATION BIOPSY  08/29/2022   Procedure: BRONCHIAL NEEDLE ASPIRATION BIOPSIES;  Surgeon: [name], MD;  Location:  [facility] ENDOSCOPY;  Service: Pulmonary;;   BRONCHIAL WASHINGS  08/29/2022   Procedure: BRONCHIAL WASHINGS;  Surgeon: [name], MD;  Location: [facility] ENDOSCOPY;  Service: Pulmonary;;   FIDUCIAL MARKER PLACEMENT  08/29/2022   Procedure: FIDUCIAL MARKER PLACEMENT;  Surgeon: [name], MD;  Location: [facility] ENDOSCOPY;  Service: Pulmonary;;   HERNIA REPAIR     as an infant   IR IMAGING GUIDED PORT INSERTION  10/03/2022   IR RADIOLOGIST EVAL & MGMT  10/21/2022   VIDEO BRONCHOSCOPY WITH ENDOBRONCHIAL ULTRASOUND  08/29/2022    Procedure: VIDEO BRONCHOSCOPY WITH ENDOBRONCHIAL ULTRASOUND;  Surgeon: [name], MD;  Location: [facility] ENDOSCOPY;  Service: Pulmonary;;     FAMILY HISTORY: History reviewed. No pertinent family history.   SOCIAL HISTORY:  reports that he quit smoking about 5 years ago. His smoking use included cigarettes. He has a 52.50 pack-year smoking history. He has never used smokeless tobacco. He reports that he does not drink alcohol and does not use drugs.  The patient is married and lives in [place].  He works for a company that [employer].   ALLERGIES: Patient has no known allergies.   MEDICATIONS:  Current Outpatient Medications  Medication Sig Dispense Refill   albuterol (PROVENTIL) (2.5 MG/3ML) 0.083% nebulizer solution Take 3 mLs (2.5 mg total) by nebulization every 6 (six) hours as needed for wheezing or shortness of breath. 75 mL 5   albuterol (VENTOLIN HFA) 108 (90 Base) MCG/ACT inhaler Inhale 2 puffs into the lungs every 6 (six) hours as needed for wheezing. 2 each 11   apixaban (ELIQUIS) 5 MG TABS tablet Take 1 tablet (5 mg total) by mouth 2 (two) times daily. 60 tablet 12   APIXABAN (ELIQUIS) VTE STARTER PACK (10MG  AND 5MG ) Take as directed on package: start with two-5mg  tablets twice daily for 7 days. On day 8, switch to one-5mg  tablet twice daily. 74 each 0   atorvastatin (LIPITOR) 20 MG tablet TAKE ONE TABLET BY MOUTH DAILY 90 tablet 3   benzonatate (TESSALON) 200 MG capsule Take 1 capsule (200 mg total) by mouth 3 (three) times daily as needed for cough. 30 capsule 1   Budeson-Glycopyrrol-Formoterol (BREZTRI AEROSPHERE) 160-9-4.8 MCG/ACT AERO Inhale 2 puffs into the lungs in the morning and at bedtime. 1 each 11   COENZYME Q10 PO Take 100 mg by mouth in the morning.     gabapentin (NEURONTIN) 300 MG capsule Take 300mg  nightly for 3 nights then increase to 300mg  twice daily. After 1 week, ok to increase to 300mg  three times daily. 90 capsule 3   GUAIFENESIN 1200 PO Take  1,200 mg by mouth in the morning and at bedtime.     lidocaine-prilocaine (EMLA) cream Apply a dime size to port-a-cath 1-2 hours prior to access. Cover with saran wrap. 30 g 2   Multiple Vitamin (MULTIVITAMIN) capsule Take 1 capsule by mouth daily.     telmisartan (MICARDIS) 40 MG tablet Take 1 tablet (40 mg total) by mouth daily. 30 tablet 4   No current facility-administered medications for this encounter.     REVIEW OF SYSTEMS: On review of systems, the patient reports that he is doing okay. He denies any shortness of breath or chest pain at this time. He is hoping to avoid any future surgery and wants to make choices for treatment that maximize his quality of life. He reports his esophagitis has resolved, but was very difficult to manage during therapy. His skin changes in the posterior chest have also  improved. But he still has some retained pigment in the area. He reports he still has some of the radioplex cream we gave him last time. No other complaints are verbalized.     PHYSICAL EXAM:  Wt Readings from Last 3 Encounters:  01/22/23 175 lb 9.6 oz (79.7 kg)  01/20/23 168 lb (76.2 kg)  01/07/23 161 lb (73 kg)   Temp Readings from Last 3 Encounters:  01/22/23 (!) 97.3 F (36.3 C) (Temporal)  01/20/23 98.2 F (36.8 C) (Oral)  12/19/22 97.7 F (36.5 C) (Oral)   BP Readings from Last 3 Encounters:  01/22/23 106/72  01/20/23 124/89  01/07/23 121/76   Pulse Readings from Last 3 Encounters:  01/22/23 90  01/20/23 73  01/07/23 87   Pain Assessment Pain Score: 0-No pain/10  In general this is a well appearing male in no acute distress. He's alert and oriented x4 and appropriate throughout the examination. Cardiopulmonary assessment is negative for acute distress and he exhibits normal effort. The posterior chest reveals hyperpigmentation that is about 10 cm in greatest dimension from the lower central back extending toward lower left lung field. No desquamation is noted.      ECOG = 1  0 - Asymptomatic (Fully active, able to carry on all predisease activities without restriction)  1 - Symptomatic but completely ambulatory (Restricted in physically strenuous activity but ambulatory and able to carry out work of a light or sedentary nature. For example, light housework, office work)  2 - Symptomatic, <50% in bed during the day (Ambulatory and capable of all self care but unable to carry out any work activities. Up and about more than 50% of waking hours)  3 - Symptomatic, >50% in bed, but not bedbound (Capable of only limited self-care, confined to bed or chair 50% or more of waking hours)  4 - Bedbound (Completely disabled. Cannot carry on any self-care. Totally confined to bed or chair)  5 - Death   Santiago Glad MM, Creech RH, Tormey DC, et al. [id number]). "Toxicity and response criteria of the [facility]". Am. Evlyn Clines. Oncol. 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 Completion of chemo radiation therapy. EXAM: NUCLEAR MEDICINE PET SKULL BASE TO THIGH TECHNIQUE: 8.6 mCi F-18 FDG was injected intravenously. Full-ring PET imaging was performed from the skull base to thigh after the radiotracer. CT data was obtained and used for attenuation correction and anatomic localization. Fasting blood glucose: 95 mg/dl COMPARISON:  CT chest 96/10/5407, PET-CT 08/14/2022 FINDINGS: Mediastinal blood pool  activity: SUV max 1.64 Liver activity: SUV max NA NECK: No hypermetabolic lymph nodes in the neck. Incidental CT findings: None. CHEST: LEFT lower lobe pulmonary nodule is decreased in size measuring 2.4 by 1.8 cm compared to 3.3 by 2.4 cm on PET-CT 08/14/2022. There is however persistent intense  metabolic activity associated with the nodule with SUV max equal 8.6. Hypermetabolic LEFT hilar lymph node with SUV max equal 3.3. Within the RIGHT upper lobe, small nodule associated clips (image 60/CT series 4) does not have metabolic activity. There is intense focus of metabolic activity in the musculature of the RIGHT shoulder anterior to the [facility] joint (image 34) with SUV max equal 6.3. No CT lesion evident. Incidental CT findings: Port in the anterior chest wall with tip in distal SVC. ABDOMEN/PELVIS: No abnormal hypermetabolic activity within the liver, pancreas, adrenal glands, or spleen. No hypermetabolic lymph nodes in the abdomen or pelvis. Incidental CT findings: None. SKELETON: No hypermetabolic activity suggest skeletal metastasis. Photopenia through the lower thoracic spine related to radiation therapy. Incidental CT findings: None. IMPRESSION: 1. Decrease in size of LEFT lower lobe pulmonary nodule. Persistent intense metabolic activity associated with the nodule is concerning for residual carcinoma. 2. Hypermetabolic small LEFT hilar lymph node is indeterminate. 3. No metabolic activity associated with RIGHT upper lobe pulmonary nodule. 4. Intense focus of metabolic activity in the musculature of the RIGHT shoulder anterior to the [facility] joint. Favor non neoplastic activity. Finding may relate to radiotracer injection (RIGHT side injection). 5. No distant metastatic disease. 6. Post radiation change in the marrow of the lower thoracic spine. Electronically Signed   By: Genevive Bi M.D.   On: 01/01/2023 16:48       IMPRESSION/PLAN: 1. Stage III cT3N1M0, NSCLC, adenocarcinoma of the LLL. Dr. Mitzi Hansen  discusses the patient's course to date and the findings from his recent CT and PET imaging. He reviews that there may still be some benefit to see from his prior therapy, but acknowledges the concerns about the hypermetabolism still seen on PET. He discussed the option to consider additional radiotherapy but with a lower dose than previously given. Stereotactic body radiotherapy (SBRT) would be somewhat different than what Dr. Mitzi Hansen would consider, and would rather consider this as additional boost to the tumor site.  We discussed the risks, benefits, short, and long term effects of radiotherapy, as well as the curative intent. We especially discussed risks of reirradiation and local risks of hemoptysis, fistula, and rib fracture;  the patient is interested in proceeding. Dr. Mitzi Hansen discusses the delivery and logistics of radiotherapy and anticipates a course of 3 fractions every other day  weeks of radiotherapy. He is aware that his CT simulation would also be another data point to compare the size of the tumor in the LLL. He will also proceed with immunotherapy once radiotherapy has completed. At the conclusion, the patient and his wife are comfortable moving forward, but are aware this is a very individualized situation he is in and is aware of risks especially considering his anticoagulation.     In a visit lasting 60 minutes, greater than 50% of the time was spent face to face discussing the patient's condition, in preparation for the discussion, and coordinating the patient's care.      Osker Mason, [facility]  **Disclaimer: This note was dictated with voice recognition software. Similar sounding words can inadvertently be transcribed and this note may contain transcription errors which may not have been corrected upon publication of note.**

## 2023-01-22 NOTE — Progress Notes (Signed)
Patient was seen by RadOnc today and will proceed with radiation. He will follow up in this office after radiation complete to consider maintenance immunotherapy.   Oncology Nurse Navigator Documentation     01/22/2023    1:00 PM  Oncology Nurse Navigator Flowsheets  Navigator Follow Up Date: 02/17/2023  Navigator Follow Up Reason: Follow-up Appointment;Chemotherapy  Navigator Location [facility]  Navigator Encounter Type Appt/Treatment Plan Review  Patient Visit Type MedOnc  Treatment Phase Active Tx  Barriers/Navigation Needs No Barriers At This Time  Interventions None Required  Acuity Level 1-No Barriers  Support Groups/Services Friends and Family  Time Spent with Patient 15

## 2023-01-23 ENCOUNTER — Ambulatory Visit
Admission: RE | Admit: 2023-01-23 | Discharge: 2023-01-23 | Disposition: A | Discharge status: Home / Self Care | Payer: BC Managed Care – PPO | Source: Ambulatory Visit | Attending: Radiation Oncology | Admitting: Radiation Oncology

## 2023-01-23 DIAGNOSIS — Z87891 Personal history of nicotine dependence: Secondary | ICD-10-CM | POA: Insufficient documentation

## 2023-01-23 DIAGNOSIS — C3432 Malignant neoplasm of lower lobe, left bronchus or lung: Secondary | ICD-10-CM | POA: Diagnosis not present

## 2023-02-02 DIAGNOSIS — C3432 Malignant neoplasm of lower lobe, left bronchus or lung: Secondary | ICD-10-CM | POA: Diagnosis not present

## 2023-02-02 DIAGNOSIS — Z87891 Personal history of nicotine dependence: Secondary | ICD-10-CM | POA: Insufficient documentation

## 2023-02-04 ENCOUNTER — Other Ambulatory Visit: Payer: Self-pay

## 2023-02-04 ENCOUNTER — Ambulatory Visit
Admission: RE | Admit: 2023-02-04 | Discharge: 2023-02-04 | Disposition: A | Discharge status: Home / Self Care | Payer: BC Managed Care – PPO | Source: Ambulatory Visit | Attending: Radiation Oncology | Admitting: Radiation Oncology

## 2023-02-04 DIAGNOSIS — C3432 Malignant neoplasm of lower lobe, left bronchus or lung: Secondary | ICD-10-CM | POA: Diagnosis not present

## 2023-02-04 DIAGNOSIS — Z87891 Personal history of nicotine dependence: Secondary | ICD-10-CM | POA: Diagnosis not present

## 2023-02-04 LAB — RAD ONC ARIA SESSION SUMMARY
Course Elapsed Days: 0
Plan Fractions Treated to Date: 1
Plan Prescribed Dose Per Fraction: 6.5 Gy
Plan Total Fractions Prescribed: 3
Plan Total Prescribed Dose: 19.5 Gy
Reference Point Dosage Given to Date: 6.5 Gy
Reference Point Session Dosage Given: 6.5 Gy
Session Number: 1

## 2023-02-06 ENCOUNTER — Other Ambulatory Visit: Payer: Self-pay

## 2023-02-06 ENCOUNTER — Ambulatory Visit
Admission: RE | Admit: 2023-02-06 | Discharge: 2023-02-06 | Disposition: A | Discharge status: Home / Self Care | Payer: BC Managed Care – PPO | Source: Ambulatory Visit | Attending: Radiation Oncology | Admitting: Radiation Oncology

## 2023-02-06 DIAGNOSIS — C3432 Malignant neoplasm of lower lobe, left bronchus or lung: Secondary | ICD-10-CM | POA: Diagnosis not present

## 2023-02-06 DIAGNOSIS — Z87891 Personal history of nicotine dependence: Secondary | ICD-10-CM | POA: Diagnosis not present

## 2023-02-06 LAB — RAD ONC ARIA SESSION SUMMARY
Course Elapsed Days: 2
Plan Fractions Treated to Date: 2
Plan Prescribed Dose Per Fraction: 6.5 Gy
Plan Total Fractions Prescribed: 3
Plan Total Prescribed Dose: 19.5 Gy
Reference Point Dosage Given to Date: 13 Gy
Reference Point Session Dosage Given: 6.5 Gy
Session Number: 2

## 2023-02-07 ENCOUNTER — Other Ambulatory Visit: Payer: BC Managed Care – PPO

## 2023-02-07 ENCOUNTER — Ambulatory Visit: Payer: BC Managed Care – PPO | Admitting: Hematology & Oncology

## 2023-02-10 NOTE — Progress Notes (Signed)
Pharmacist Chemotherapy Monitoring - Initial Assessment    Anticipated start date: 02/17/23   The following has been reviewed per standard work regarding the patient's treatment regimen: The patient's diagnosis, treatment plan and drug doses, and organ/hematologic function Lab orders and baseline tests specific to treatment regimen  The treatment plan start date, drug sequencing, and pre-medications Prior authorization status  Patient's documented medication list, including drug-drug interaction screen and prescriptions for anti-emetics and supportive care specific to the treatment regimen The drug concentrations, fluid compatibility, administration routes, and timing of the medications to be used The patient's access for treatment and lifetime cumulative dose history, if applicable  The patient's medication allergies and previous infusion related reactions, if applicable   Changes made to treatment plan:  N/A  Follow up needed:  N/A   Madine Sarr, Nevin Bloodgood, RPH, 02/10/2023  8:22 AM

## 2023-02-11 ENCOUNTER — Other Ambulatory Visit: Payer: Self-pay

## 2023-02-11 ENCOUNTER — Ambulatory Visit
Admission: RE | Admit: 2023-02-11 | Discharge: 2023-02-11 | Disposition: A | Discharge status: Home / Self Care | Payer: BC Managed Care – PPO | Source: Ambulatory Visit | Attending: Radiation Oncology | Admitting: Radiation Oncology

## 2023-02-11 ENCOUNTER — Ambulatory Visit: Payer: BC Managed Care – PPO

## 2023-02-11 DIAGNOSIS — Z510 Encounter for antineoplastic radiation therapy: Secondary | ICD-10-CM | POA: Diagnosis not present

## 2023-02-11 DIAGNOSIS — C3432 Malignant neoplasm of lower lobe, left bronchus or lung: Secondary | ICD-10-CM | POA: Diagnosis not present

## 2023-02-11 DIAGNOSIS — Z87891 Personal history of nicotine dependence: Secondary | ICD-10-CM | POA: Diagnosis not present

## 2023-02-11 LAB — RAD ONC ARIA SESSION SUMMARY
Course Elapsed Days: 7
Plan Fractions Treated to Date: 3
Plan Prescribed Dose Per Fraction: 6.5 Gy
Plan Total Fractions Prescribed: 3
Plan Total Prescribed Dose: 19.5 Gy
Reference Point Dosage Given to Date: 19.5 Gy
Reference Point Session Dosage Given: 6.5 Gy
Session Number: 3

## 2023-02-12 NOTE — Radiation Completion Notes (Addendum)
  Radiation Oncology         [phone]) [phone] ________________________________  Name: Brandon Phillips. MRN: [id number]  Date of Service: 02/11/2023  DOB: 02/16/1966  End of Treatment Note  Diagnosis:  Stage III [id number], NSCLC, adenocarcinoma of the LLL   Intent: Curative     ==========DELIVERED PLANS==========  First Treatment Date: 2023-02-04 - Last Treatment Date: 2023-02-11   Plan Name: Lung_L_SBRT Site: Lung, Left Technique: SBRT/SRT-IMRT Mode: Photon Dose Per Fraction: 6.5 Gy Prescribed Dose (Delivered / Prescribed): 19.5 Gy / 19.5 Gy Prescribed Fxs (Delivered / Prescribed): 3 / 3     ==========ON TREATMENT VISIT DATES========== 2023-02-04, 2023-02-06, 2023-02-11, 2023-02-11   See weekly On Treatment Notes in Epic for details. The patient tolerated radiation. He developed fatigue and anticipated skin changes in the treatment field. He did not complain of esophagitis at the conclusion of treatment. He was having difficulty with O2 saturation and was being worked up by pulmonary for this.  The patient will receive a call in about one month from the radiation oncology department. He will continue follow up with Dr. Myna Hidalgo as well.      Osker Mason, PAC

## 2023-02-13 ENCOUNTER — Other Ambulatory Visit: Payer: Self-pay

## 2023-02-13 ENCOUNTER — Ambulatory Visit (INDEPENDENT_AMBULATORY_CARE_PROVIDER_SITE_OTHER): Payer: BC Managed Care – PPO | Admitting: Nurse Practitioner

## 2023-02-13 ENCOUNTER — Ambulatory Visit (INDEPENDENT_AMBULATORY_CARE_PROVIDER_SITE_OTHER): Payer: BC Managed Care – PPO

## 2023-02-13 ENCOUNTER — Encounter: Payer: Self-pay | Admitting: Nurse Practitioner

## 2023-02-13 VITALS — BP 104/70 | HR 95 | Temp 98.9°F | Ht 65.0 in | Wt 177.6 lb

## 2023-02-13 DIAGNOSIS — J441 Chronic obstructive pulmonary disease with (acute) exacerbation: Secondary | ICD-10-CM

## 2023-02-13 DIAGNOSIS — J9601 Acute respiratory failure with hypoxia: Secondary | ICD-10-CM | POA: Diagnosis not present

## 2023-02-13 DIAGNOSIS — R0902 Hypoxemia: Secondary | ICD-10-CM | POA: Diagnosis not present

## 2023-02-13 DIAGNOSIS — R918 Other nonspecific abnormal finding of lung field: Secondary | ICD-10-CM | POA: Diagnosis not present

## 2023-02-13 DIAGNOSIS — C3432 Malignant neoplasm of lower lobe, left bronchus or lung: Secondary | ICD-10-CM

## 2023-02-13 DIAGNOSIS — J9600 Acute respiratory failure, unspecified whether with hypoxia or hypercapnia: Secondary | ICD-10-CM | POA: Insufficient documentation

## 2023-02-13 DIAGNOSIS — J9611 Chronic respiratory failure with hypoxia: Secondary | ICD-10-CM | POA: Insufficient documentation

## 2023-02-13 DIAGNOSIS — R058 Other specified cough: Secondary | ICD-10-CM | POA: Diagnosis not present

## 2023-02-13 DIAGNOSIS — R0609 Other forms of dyspnea: Secondary | ICD-10-CM

## 2023-02-13 MED ORDER — AZITHROMYCIN 250 MG PO TABS: ORAL_TABLET | ORAL | 0 refills | Status: DC

## 2023-02-13 MED ORDER — PREDNISONE 10 MG PO TABS
ORAL_TABLET | ORAL | 0 refills | Status: DC
Start: 2023-02-13 — End: 2023-03-17

## 2023-02-13 NOTE — Assessment & Plan Note (Signed)
Undergoing SBRT. Plan to start immunotherapy Monday. Follow up with oncology as scheduled.

## 2023-02-13 NOTE — Progress Notes (Signed)
@Patient  ID: Brandon Phillips., male    DOB: 1965/12/30, 57 y.o.   MRN: [id number]  Chief Complaint  Patient presents with   Follow-up    Increased SOB over past week. He gets winded walking distances such as out parking lot into the building today. He is coughing some- prod with white sputum. He has wheezing that comes and goes. He is using his albuterol inhaler about 2 x per day and neb with albuterol 2 x per wk.     Referring provider: [name], NP  HPI: 57 year old male, former smoker followed for NSCLC of left lung and COPD. He is a patient of Dr. [name] and last seen in office 12/19/2022. Past medical history significant for HTN, allergic rhinitis.   TEST/EVENTS:  07/25/2022 LDCT chest: Atherosclerosis.  Spiculated solid central left lower lobe lung mass measuring 35 mm, new.  Solid peripheral right upper lobe pulmonary nodule measuring 7.9 mm. 08/14/2022 PET scan: Low-level hypermetabolism of the right upper lobe pulmonary nodule.  Hypermetabolism of the left lower lobe lung mass and ipsilateral hilar nodal metastasis. 08/26/2022 PFT: FVC 66, FEV1 42, ratio 48, TLC 122, DLCOcor 57.  No BD. 11/19/2022 CXR: stable left lower lobe mass. New right-sided chest wall port.  12/04/2022 CTA chest: small subsegmental RLL PE. LLL perihilar mass, 2.2x1.8 cm. No LAD. No acute consolidation.  01/01/2023 PET: LLL nodule with intense hypermetabolism despite decrease in size. Hypermetabolic left hilar node. Intense metabolic activity in right shoulder AC joint, no CT lesion evident and favors non-neoplastic activity. No distant metastatic disease. Post radiation change in marrow of lower thoracic spine.   10/29/2022: OV with Dr. Thora Lance.  Undergoing radiation for stage IIb NSCLC left lower lobe, neoadjuvant chemotherapy with subsequent plan for restaging.  Resection potentially offered though he is hopeful to avoid surgery.  Does feel like his dyspnea has been worse since last visit.  He has had cough  with yellow sputum production over the last 6 days.  He also increased pain with swallowing since starting XRT.  Treated for AE COPD with Z-Pak and prednisone taper.  Switched from Symbicort to [employer].  Advised to call if symptoms did not improve.  11/19/2022: OV with Kelisha Dall NP for acute visit with his wife.  He was seen 4/2 and treated for a COPD with Z-Pak and prednisone taper.  He tells me that when he was on the prednisone, he was feeling a little bit better.  His wife was not noticing him wheeze.  Since he has finished these medications, he feels like he is more short of breath, has more chest congestion and wheezing and is still producing yellow phlegm.  Cough tends to be worse at night and then in the morning when he first gets up.  He does have some nasal drainage.  Denies any fevers, night sweats, hemoptysis, lower extremity swelling, orthopnea.  No interim sick exposures.  Using [place] twice daily.  Does not have a neb machine at home. Eating and drinking well.   12/04/2022:  OV with Katy Brickell NP for follow up. We treated him for unresolved acute bronchitis/AECOPD at his last visit with doxycycline course and prednisone taper. He had previously had good response to the steroids but flared back up once off. His CXR did not show an acute process. He was also recommended to start intranasal steroid for postnasal drainage and cough control measures.  Today, he tells me that he is some better but still having some chest congestion and cough.  His cough is mostly dry but occasionally producing some white phlegm at times. Does have chest discomfort with the cough at times. His heart rate is up compared to his last visit. They haven't noticed much wheezing. Breathing feels better but still more short winded than his baseline. He is having some increased voice hoarseness. Denies fevers, chills, hemoptysis, leg swelling, calf pain, palpitations. He is using Clinical cytogeneticist. He's not sure if the Markus Daft is making him cough more  and wonders if he should go back to his Symbicort. Doesn't notice an increase cough right after he uses it. He is using flonase but he thinks this makes him drain more. Still using his neb treatments twice a day, which help some. Not using any cough suppressants as he was worried that he shouldn't be suppressing his cough.  D dimer positive - CTA positive for subsegmental PE. Admitted for treatment. No right heart strain  12/19/2022: OV with Dr. Thora Lance. Started on Eliquis for subsegmental RLL PE. Doing ok now. Quite a bit of chest congestion. Using breztri bid. Advised to use mucinex and flutter valve for chest congestion. Upcoming restaging PET/CT. F/u 8 weeks or sooner.  02/13/2023: Today - follow up Patient presents today for intended follow up but he is also struggling with some acute symptoms. He's had increased shortness of breath over the past week or two. He gets winded just walking from the parking lot into the building. He is coughing some more. Producing some white phlegm. He does have some wheezing at times. His wife says that he had some low O2 levels over the weekend, down to 84% with exertion. He denies any fevers, chills, hemoptysis, leg swelling, calf pain, orthopnea, CP. He has not had any missed doses of Eliquis. No excessive bruising or bleeding. He is using his albuterol twice a day and neb twice a week. Still on Breztri.   No Known Allergies  Immunization History  Administered Date(s) Administered   PFIZER(Purple Top)SARS-COV-2 Vaccination 08/03/2020, 08/25/2020   Pneumococcal Polysaccharide-23 01/15/2018   Tdap 01/15/2018   Zoster Recombinant(Shingrix) 07/30/2019, 11/27/2019    Past Medical History:  Diagnosis Date   Cancer (HCC)    lung cancer   CAP (community acquired pneumonia)    COPD (chronic obstructive pulmonary disease) (HCC)    Eczema    HLD (hyperlipidemia)    Primary cancer of left lower lobe of lung (HCC) 09/17/2022   Tobacco use disorder 01/24/2017     Tobacco History: Social History   Tobacco Use  Smoking Status Former   Current packs/day: 0.00   Average packs/day: 1.5 packs/day for 35.0 years (52.5 ttl pk-yrs)   Types: Cigarettes   Start date: 03/30/1982   Quit date: 03/30/2017   Years since quitting: 5.8  Smokeless Tobacco Never   Counseling given: Not Answered   Outpatient Medications Prior to Visit  Medication Sig Dispense Refill   albuterol (PROVENTIL) (2.5 MG/3ML) 0.083% nebulizer solution Take 3 mLs (2.5 mg total) by nebulization every 6 (six) hours as needed for wheezing or shortness of breath. 75 mL 5   albuterol (VENTOLIN HFA) 108 (90 Base) MCG/ACT inhaler Inhale 2 puffs into the lungs every 6 (six) hours as needed for wheezing. 2 each 11   apixaban (ELIQUIS) 5 MG TABS tablet Take 1 tablet (5 mg total) by mouth 2 (two) times daily. 60 tablet 12   atorvastatin (LIPITOR) 20 MG tablet TAKE ONE TABLET BY MOUTH DAILY 90 tablet 3   benzonatate (TESSALON) 200 MG capsule Take 1 capsule (200  mg total) by mouth 3 (three) times daily as needed for cough. 30 capsule 1   Budeson-Glycopyrrol-Formoterol (BREZTRI AEROSPHERE) 160-9-4.8 MCG/ACT AERO Inhale 2 puffs into the lungs in the morning and at bedtime. 1 each 11   COENZYME Q10 PO Take 100 mg by mouth in the morning.     gabapentin (NEURONTIN) 300 MG capsule Take 300mg  nightly for 3 nights then increase to 300mg  twice daily. After 1 week, ok to increase to 300mg  three times daily. 90 capsule 3   GUAIFENESIN 1200 PO Take 1,200 mg by mouth in the morning and at bedtime.     lidocaine-prilocaine (EMLA) cream Apply a dime size to port-a-cath 1-2 hours prior to access. Cover with saran wrap. 30 g 2   Multiple Vitamin (MULTIVITAMIN) capsule Take 1 capsule by mouth daily.     telmisartan (MICARDIS) 40 MG tablet Take 1 tablet (40 mg total) by mouth daily. 30 tablet 4   APIXABAN (ELIQUIS) VTE STARTER PACK (10MG  AND 5MG ) Take as directed on package: start with two-5mg  tablets twice daily for 7  days. On day 8, switch to one-5mg  tablet twice daily. 74 each 0   No facility-administered medications prior to visit.     Review of Systems:   Constitutional: No weight loss or gain, night sweats, fevers, chills, or lassitude. +fatigue  HEENT: No headaches, difficulty swallowing, tooth/dental problems, or sore throat. No sneezing, itching, ear ache. +nasal congestion, post nasal drip, voice hoarseness  CV:  No chest pain, orthopnea, PND, swelling in lower extremities, anasarca, dizziness, palpitations, syncope Resp: +shortness of breath with exertion; cough; wheezing. No hemoptysis. No chest wall deformity GI:  No heartburn, indigestion, abdominal pain, nausea, vomiting, diarrhea, change in bowel habits, loss of appetite, bloody stools.  GU: No dysuria, change in color of urine, urgency or frequency.  No flank pain, no hematuria  Skin: No rash, lesions, ulcerations MSK:  No joint pain or swelling.   Neuro: No dizziness or lightheadedness.  Psych: No depression or anxiety. Mood stable.     Physical Exam:  BP 104/70 (BP Location: Left Arm, Cuff Size: Normal)   Pulse 95   Temp 98.9 F (37.2 C) (Oral)   Ht 5\' 5"  (1.651 m)   Wt 177 lb 9.6 oz (80.6 kg)   SpO2 91% Comment: on RA  BMI 29.55 kg/m   GEN: Pleasant, interactive, well-appearing; in no acute distress. HEENT:  Normocephalic and atraumatic. PERRLA. Sclera white. Nasal turbinates pink, moist and patent bilaterally. No rhinorrhea present. Oropharynx pink and moist, without exudate or edema. No lesions, ulcerations, or postnasal drip.  NECK:  Supple w/ fair ROM. No JVD present. Normal carotid impulses w/o bruits. Thyroid symmetrical with no goiter or nodules palpated. No lymphadenopathy.   CV: RRR, no m/r/g, no peripheral edema. Pulses intact, +2 bilaterally. No cyanosis, pallor or clubbing. PULMONARY:  Unlabored, regular breathing at rest. Increased work of breathing with exertion. Expiratory wheeze bilaterally A&P. No accessory  muscle use.  GI: BS present and normoactive. Soft, non-tender to palpation. No organomegaly or masses detected.  MSK: No erythema, warmth or tenderness. Cap refil <2 sec all extrem. No deformities or joint swelling noted.  Neuro: A/Ox3. No focal deficits noted.   Skin: Warm, no lesions or rashe Psych: Normal affect and behavior. Judgement and thought content appropriate.     Lab Results:  CBC    Component Value Date/Time   WBC 6.7 01/20/2023 0925   WBC 9.4 12/05/2022 0640   RBC 4.56 01/20/2023 0925  HGB 14.5 01/20/2023 0925   HCT 43.0 01/20/2023 0925   PLT 199 01/20/2023 0925   MCV 94.3 01/20/2023 0925   MCH 31.8 01/20/2023 0925   MCHC 33.7 01/20/2023 0925   RDW 13.2 01/20/2023 0925   LYMPHSABS 0.7 01/20/2023 0925   MONOABS 0.7 01/20/2023 0925   EOSABS 0.1 01/20/2023 0925   BASOSABS 0.1 01/20/2023 0925    BMET    Component Value Date/Time   NA 138 01/20/2023 0925   K 3.8 01/20/2023 0925   CL 100 01/20/2023 0925   CO2 28 01/20/2023 0925   GLUCOSE 99 01/20/2023 0925   BUN 12 01/20/2023 0925   CREATININE 0.98 01/20/2023 0925   CREATININE 1.05 01/11/2022 0000   CALCIUM 9.5 01/20/2023 0925   GFRNONAA >60 01/20/2023 0925   GFRNONAA 88 04/06/2020 0708   GFRAA 102 04/06/2020 0708    BNP No results found for: "BNP"   Imaging:  DG Chest 2 View  Result Date: 02/13/2023 CLINICAL DATA:  Productive cough, hypoxia EXAM: CHEST - 2 VIEW COMPARISON:  chest x-ray November 19, 2022 FINDINGS: Right chest wall port in place, terminating near the superior cavoatrial junction. The cardiomediastinal silhouette is unchanged in contour. Unchanged left perihilar mass. No acute focal pulmonary opacity. No pleural effusion or pneumothorax. The visualized upper abdomen is unremarkable. No acute osseous abnormality. IMPRESSION: Unchanged left perihilar mass. No acute cardiopulmonary abnormality. Electronically Signed   By: Jacob Moores M.D.   On: 02/13/2023 15:17    Administration History      None          Latest Ref Rng & Units 08/26/2022    3:25 PM  PFT Results  FVC-Pre L 2.75   FVC-Predicted Pre % 66   FVC-Post L 2.77   FVC-Predicted Post % 67   Pre FEV1/FVC % % 48   Post FEV1/FCV % % 48   FEV1-Pre L 1.33   FEV1-Predicted Pre % 42   FEV1-Post L 1.33   DLCO uncorrected ml/min/mmHg 15.07   DLCO UNC% % 61   DLCO corrected ml/min/mmHg 14.04   DLCO COR %Predicted % 57   DLVA Predicted % 60   TLC L 7.30   TLC % Predicted % 122   RV % Predicted % 235     No results found for: "NITRICOXIDE"      Assessment & Plan:   COPD with acute exacerbation (HCC) AECOPD with new oxygen requirement. CXR without superimposed infection. We will treat him with prednisone taper and z pack. Medication education provided. Maximize bronchodilator regimen. Close follow up. Strict ED precautions.  Patient Instructions  Continue Albuterol inhaler 2 puffs or 3 mL neb every 6 hours as needed for shortness of breath or wheezing. Use nebs 2-4 times a day until symptoms improve. Follow with flutter valve Continue Breztri 2 puffs Twice daily. Brush tongue and rinse mouth afterwards  Continue guaifenesin 1200 mg Twice daily for chest congestion/cough Continue Flonase nasal spray 2 sprays each nostril daily for nasal congestion/drainage   Start supplemental oxygen 4 lpm on POC with activity and 2 lpm continuous at night for goal >88-90%  Prednisone taper. 4 tabs for 3 days, then 3 tabs for 3 days, 2 tabs for 3 days, then 1 tab for 3 days, then stop. Take in AM with food  Azithromycin - take 2 tabs on day one then 1 tab daily for four additional days. Take with food    Labs today    Follow up in 7-10 days with new  pulmonologist or Katie Sophiah Rolin,NP. Ok to double book KC. If symptoms do not improve or worsen, please contact office for sooner follow up or seek emergency care.    Acute respiratory failure (HCC) See above. He is on [facility] for recent PE. No signs of bleeding but will check CBC  to rule out anemia. BNP to assess for cardiac component; although, no obvious signs of volume overload on exam today. Compliant with Eliquis so low suspicion for recurrent PE. He will start supplemental oxygen 2-4 lpm for goal >88-90%. Urgent new start placed today.   Primary cancer of left lower lobe of lung (HCC) Undergoing SBRT. Plan to start immunotherapy Monday. Follow up with oncology as scheduled.      I spent 42 minutes of dedicated to the care of this patient on the date of this encounter to include pre-visit review of records, face-to-face time with the patient discussing conditions above, post visit ordering of testing, clinical documentation with the electronic health record, making appropriate referrals as documented, and communicating necessary findings to members of the patients care team.  [name], NP 02/13/2023  Pt aware and understands NP's role.

## 2023-02-13 NOTE — Assessment & Plan Note (Signed)
AECOPD with new oxygen requirement. CXR without superimposed infection. We will treat him with prednisone taper and z pack. Medication education provided. Maximize bronchodilator regimen. Close follow up. Strict ED precautions.  Patient Instructions  Continue Albuterol inhaler 2 puffs or 3 mL neb every 6 hours as needed for shortness of breath or wheezing. Use nebs 2-4 times a day until symptoms improve. Follow with flutter valve Continue Breztri 2 puffs Twice daily. Brush tongue and rinse mouth afterwards  Continue guaifenesin 1200 mg Twice daily for chest congestion/cough Continue Flonase nasal spray 2 sprays each nostril daily for nasal congestion/drainage   Start supplemental oxygen 4 lpm on POC with activity and 2 lpm continuous at night for goal >88-90%  Prednisone taper. 4 tabs for 3 days, then 3 tabs for 3 days, 2 tabs for 3 days, then 1 tab for 3 days, then stop. Take in AM with food  Azithromycin - take 2 tabs on day one then 1 tab daily for four additional days. Take with food    Labs today    Follow up in 7-10 days with new pulmonologist or Katie Lamiyah Schlotter,NP. Ok to double book KC. If symptoms do not improve or worsen, please contact office for sooner follow up or seek emergency care.

## 2023-02-13 NOTE — Patient Instructions (Addendum)
Continue Albuterol inhaler 2 puffs or 3 mL neb every 6 hours as needed for shortness of breath or wheezing. Use nebs 2-4 times a day until symptoms improve. Follow with flutter valve Continue Breztri 2 puffs Twice daily. Brush tongue and rinse mouth afterwards  Continue guaifenesin 1200 mg Twice daily for chest congestion/cough Continue Flonase nasal spray 2 sprays each nostril daily for nasal congestion/drainage   Start supplemental oxygen 4 lpm on POC with activity and 2 lpm continuous at night for goal >88-90%  Prednisone taper. 4 tabs for 3 days, then 3 tabs for 3 days, 2 tabs for 3 days, then 1 tab for 3 days, then stop. Take in AM with food  Azithromycin - take 2 tabs on day one then 1 tab daily for four additional days. Take with food    Labs today    Follow up in 7-10 days with new pulmonologist or Katie Deronda Christian,NP. Ok to double book KC. If symptoms do not improve or worsen, please contact office for sooner follow up or seek emergency care.

## 2023-02-13 NOTE — Assessment & Plan Note (Signed)
See above. He is on [facility] for recent PE. No signs of bleeding but will check CBC to rule out anemia. BNP to assess for cardiac component; although, no obvious signs of volume overload on exam today. Compliant with Eliquis so low suspicion for recurrent PE. He will start supplemental oxygen 2-4 lpm for goal >88-90%. Urgent new start placed today.

## 2023-02-14 ENCOUNTER — Encounter: Payer: Self-pay | Admitting: Hematology & Oncology

## 2023-02-14 LAB — CBC WITH DIFFERENTIAL/PLATELET
Basophils Absolute: 0.1 10*3/uL (ref 0.0–0.1)
Basophils Relative: 1.1 % (ref 0.0–3.0)
Eosinophils Absolute: 0.3 10*3/uL (ref 0.0–0.7)
Eosinophils Relative: 4.8 % (ref 0.0–5.0)
HCT: 40.3 % (ref 39.0–52.0)
Hemoglobin: 13.4 g/dL (ref 13.0–17.0)
Lymphocytes Relative: 7.8 % — ABNORMAL LOW (ref 12.0–46.0)
Lymphs Abs: 0.5 10*3/uL — ABNORMAL LOW (ref 0.7–4.0)
MCHC: 33.2 g/dL (ref 30.0–36.0)
MCV: 93 fl (ref 78.0–100.0)
Monocytes Absolute: 0.8 10*3/uL (ref 0.1–1.0)
Monocytes Relative: 12.1 % — ABNORMAL HIGH (ref 3.0–12.0)
Neutro Abs: 4.7 10*3/uL (ref 1.4–7.7)
Neutrophils Relative %: 74.2 % (ref 43.0–77.0)
Platelets: 286 10*3/uL (ref 150.0–400.0)
RBC: 4.34 Mil/uL (ref 4.22–5.81)
RDW: 13.9 % (ref 11.5–15.5)
WBC: 6.4 10*3/uL (ref 4.0–10.5)

## 2023-02-14 LAB — BASIC METABOLIC PANEL
BUN: 11 mg/dL (ref 6–23)
CO2: 27 mEq/L (ref 19–32)
Calcium: 9.3 mg/dL (ref 8.4–10.5)
Chloride: 98 mEq/L (ref 96–112)
Creatinine, Ser: 1.24 mg/dL (ref 0.40–1.50)
GFR: 65.42 mL/min (ref >60.00)
Glucose, Bld: 90 mg/dL (ref 70–99)
Potassium: 4 mEq/L (ref 3.5–5.1)
Sodium: 135 mEq/L (ref 135–145)

## 2023-02-17 ENCOUNTER — Inpatient Hospital Stay: Payer: BC Managed Care – PPO

## 2023-02-17 ENCOUNTER — Inpatient Hospital Stay: Payer: BC Managed Care – PPO | Attending: Hematology & Oncology

## 2023-02-17 ENCOUNTER — Other Ambulatory Visit: Payer: Self-pay

## 2023-02-17 ENCOUNTER — Other Ambulatory Visit: Payer: Self-pay | Admitting: *Deleted

## 2023-02-17 ENCOUNTER — Encounter: Payer: Self-pay | Admitting: Hematology & Oncology

## 2023-02-17 ENCOUNTER — Inpatient Hospital Stay: Payer: BC Managed Care – PPO | Admitting: Hematology & Oncology

## 2023-02-17 VITALS — BP 122/85 | HR 65 | Resp 17

## 2023-02-17 VITALS — BP 122/79 | HR 84 | Temp 98.3°F | Resp 20 | Ht 65.0 in | Wt 170.0 lb

## 2023-02-17 DIAGNOSIS — C3432 Malignant neoplasm of lower lobe, left bronchus or lung: Secondary | ICD-10-CM

## 2023-02-17 DIAGNOSIS — Z79620 Long term (current) use of immunosuppressive biologic: Secondary | ICD-10-CM | POA: Diagnosis not present

## 2023-02-17 DIAGNOSIS — R490 Dysphonia: Secondary | ICD-10-CM | POA: Diagnosis not present

## 2023-02-17 DIAGNOSIS — R0609 Other forms of dyspnea: Secondary | ICD-10-CM | POA: Diagnosis not present

## 2023-02-17 DIAGNOSIS — J449 Chronic obstructive pulmonary disease, unspecified: Secondary | ICD-10-CM | POA: Diagnosis not present

## 2023-02-17 DIAGNOSIS — Z5112 Encounter for antineoplastic immunotherapy: Secondary | ICD-10-CM | POA: Insufficient documentation

## 2023-02-17 DIAGNOSIS — Z79899 Other long term (current) drug therapy: Secondary | ICD-10-CM | POA: Insufficient documentation

## 2023-02-17 DIAGNOSIS — E785 Hyperlipidemia, unspecified: Secondary | ICD-10-CM | POA: Diagnosis not present

## 2023-02-17 LAB — CBC WITH DIFFERENTIAL (CANCER CENTER ONLY)
Abs Immature Granulocytes: 0.15 10*3/uL — ABNORMAL HIGH (ref 0.00–0.07)
Basophils Absolute: 0 10*3/uL (ref 0.0–0.1)
Basophils Relative: 0 %
Eosinophils Absolute: 0 10*3/uL (ref 0.0–0.5)
Eosinophils Relative: 0 %
HCT: 43 % (ref 39.0–52.0)
Hemoglobin: 14.4 g/dL (ref 13.0–17.0)
Immature Granulocytes: 1 %
Lymphocytes Relative: 3 %
Lymphs Abs: 0.4 10*3/uL — ABNORMAL LOW (ref 0.7–4.0)
MCH: 30.8 pg (ref 26.0–34.0)
MCHC: 33.5 g/dL (ref 30.0–36.0)
MCV: 92.1 fL (ref 80.0–100.0)
Monocytes Absolute: 1 10*3/uL (ref 0.1–1.0)
Monocytes Relative: 7 %
Neutro Abs: 13.7 10*3/uL — ABNORMAL HIGH (ref 1.7–7.7)
Neutrophils Relative %: 89 %
Platelet Count: 342 10*3/uL (ref 150–400)
RBC: 4.67 MIL/uL (ref 4.22–5.81)
RDW: 12.7 % (ref 11.5–15.5)
WBC Count: 15.3 10*3/uL — ABNORMAL HIGH (ref 4.0–10.5)
nRBC: 0 % (ref 0.0–0.2)

## 2023-02-17 LAB — CMP (CANCER CENTER ONLY)
ALT: 22 U/L (ref 0–44)
AST: 12 U/L — ABNORMAL LOW (ref 15–41)
Albumin: 4 g/dL (ref 3.5–5.0)
Alkaline Phosphatase: 58 U/L (ref 38–126)
Anion gap: 10 (ref 5–15)
BUN: 17 mg/dL (ref 6–20)
CO2: 26 mmol/L (ref 22–32)
Calcium: 9.5 mg/dL (ref 8.9–10.3)
Chloride: 100 mmol/L (ref 98–111)
Creatinine: 0.87 mg/dL (ref 0.61–1.24)
GFR, Estimated: >60 mL/min (ref >60)
Glucose, Bld: 212 mg/dL — ABNORMAL HIGH (ref 70–99)
Potassium: 3.7 mmol/L (ref 3.5–5.1)
Sodium: 136 mmol/L (ref 135–145)
Total Bilirubin: 0.3 mg/dL (ref 0.3–1.2)
Total Protein: 7.2 g/dL (ref 6.5–8.1)

## 2023-02-17 LAB — LACTATE DEHYDROGENASE: LDH: 148 U/L (ref 98–192)

## 2023-02-17 LAB — TSH: TSH: 0.943 u[IU]/mL (ref 0.350–4.500)

## 2023-02-17 LAB — BRAIN NATRIURETIC PEPTIDE: B Natriuretic Peptide: 87.1 pg/mL (ref 0.0–100.0)

## 2023-02-17 MED ORDER — SODIUM CHLORIDE 0.9 % IV SOLN
1500.0000 mg | Freq: Once | INTRAVENOUS | Status: AC
  Administered 2023-02-17: 1500 mg via INTRAVENOUS
  Filled 2023-02-17: qty 30

## 2023-02-17 MED ORDER — SODIUM CHLORIDE 0.9% FLUSH
10.0000 mL | INTRAVENOUS | Status: DC | PRN
  Administered 2023-02-17: 10 mL

## 2023-02-17 MED ORDER — HEPARIN SOD (PORK) LOCK FLUSH 100 UNIT/ML IV SOLN
500.0000 [IU] | Freq: Once | INTRAVENOUS | Status: AC | PRN
  Administered 2023-02-17: 500 [IU]

## 2023-02-17 MED ORDER — SODIUM CHLORIDE 0.9 % IV SOLN: Freq: Once | INTRAVENOUS | Status: AC

## 2023-02-17 NOTE — Patient Instructions (Signed)

## 2023-02-17 NOTE — Patient Instructions (Signed)
Durvalumab Injection What is this medication? DURVALUMAB (dur VAL ue mab) treats some types of cancer. It works by helping your immune system slow or stop the spread of cancer cells. It is a monoclonal antibody. This medicine may be used for other purposes; ask your health care provider or pharmacist if you have questions. COMMON BRAND NAME(S): IMFINZI What should I tell my care team before I take this medication? They need to know if you have any of these conditions: Allogeneic stem cell transplant (uses someone else's stem cells) Autoimmune diseases, such as Crohn disease, ulcerative colitis, lupus History of chest radiation Nervous system problems, such as Guillain-Barre syndrome, myasthenia gravis Organ transplant An unusual or allergic reaction to durvalumab, other medications, foods, dyes, or preservatives Pregnant or trying to get pregnant Breast-feeding How should I use this medication? This medication is infused into a vein. It is given by your care team in a hospital or clinic setting. A special MedGuide will be given to you before each treatment. Be sure to read this information carefully each time. Talk to your care team about the use of this medication in children. Special care may be needed. Overdosage: If you think you have taken too much of this medicine contact a poison control center or emergency room at once. NOTE: This medicine is only for you. Do not share this medicine with others. What if I miss a dose? Keep appointments for follow-up doses. It is important not to miss your dose. Call your care team if you are unable to keep an appointment. What may interact with this medication? Interactions have not been studied. This list may not describe all possible interactions. Give your health care provider a list of all the medicines, herbs, non-prescription drugs, or dietary supplements you use. Also tell them if you smoke, drink alcohol, or use illegal drugs. Some items may  interact with your medicine. What should I watch for while using this medication? Your condition will be monitored carefully while you are receiving this medication. You may need blood work while taking this medication. This medication may cause serious skin reactions. They can happen weeks to months after starting the medication. Contact your care team right away if you notice fevers or flu-like symptoms with a rash. The rash may be red or purple and then turn into blisters or peeling of the skin. You may also notice a red rash with swelling of the face, lips, or lymph nodes in your neck or under your arms. Tell your care team right away if you have any change in your eyesight. Talk to your care team if you may be pregnant. Serious birth defects can occur if you take this medication during pregnancy and for 3 months after the last dose. You will need a negative pregnancy test before starting this medication. Contraception is recommended while taking this medication and for 3 months after the last dose. Your care team can help you find the option that works for you. Do not breastfeed while taking this medication and for 3 months after the last dose. What side effects may I notice from receiving this medication? Side effects that you should report to your care team as soon as possible: Allergic reactions--skin rash, itching, hives, swelling of the face, lips, tongue, or throat Dry cough, shortness of breath or trouble breathing Eye pain, redness, irritation, or discharge with blurry or decreased vision Heart muscle inflammation--unusual weakness or fatigue, shortness of breath, chest pain, fast or irregular heartbeat, dizziness, swelling of the   ankles, feet, or hands Hormone gland problems--headache, sensitivity to light, unusual weakness or fatigue, dizziness, fast or irregular heartbeat, increased sensitivity to cold or heat, excessive sweating, constipation, hair loss, increased thirst or amount of  urine, tremors or shaking, irritability Infusion reactions--chest pain, shortness of breath or trouble breathing, feeling faint or lightheaded Kidney injury (glomerulonephritis)--decrease in the amount of urine, red or dark brown urine, foamy or bubbly urine, swelling of the ankles, hands, or feet Liver injury--right upper belly pain, loss of appetite, nausea, light-colored stool, dark yellow or brown urine, yellowing skin or eyes, unusual weakness or fatigue Pain, tingling, or numbness in the hands or feet, muscle weakness, change in vision, confusion or trouble speaking, loss of balance or coordination, trouble walking, seizures Rash, fever, and swollen lymph nodes Redness, blistering, peeling, or loosening of the skin, including inside the mouth Sudden or severe stomach pain, bloody diarrhea, fever, nausea, vomiting Side effects that usually do not require medical attention (report these to your care team if they continue or are bothersome): Bone, joint, or muscle pain Diarrhea Fatigue Loss of appetite Nausea Skin rash This list may not describe all possible side effects. Call your doctor for medical advice about side effects. You may report side effects to FDA at 1-800-FDA-1088. Where should I keep my medication? This medication is given in a hospital or clinic. It will not be stored at home. NOTE: This sheet is a summary. It may not cover all possible information. If you have questions about this medicine, talk to your doctor, pharmacist, or health care provider.  2024 Elsevier/Gold Standard (2021-11-27 00:00:00)  

## 2023-02-17 NOTE — Progress Notes (Signed)
Hematology and Oncology Follow Up Visit  Hersel Mcmeen [id number] 1966-04-11 57 y.o. 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 twice daily-start on 12/06/2022 Status post radiosurgery-completed on 02/11/2023 Durvalumab 1500 mg IV monthly-start cycle 1 on 02/17/2023     Interim History:  Mr. Kyllonen is back for follow-up.  He did complete radiosurgery for the residual lung disease.  He completed 3 treatments on 02/11/2023.  He did well with this.  Last week, he had a flareup of his COPD.  He comes in with oxygen.  He saw the pulmonologist.  He had a chest x-ray which showed a stable left perihilar mass.  There is no pneumonia.  He is on a steroid taper.  I think this would be okay with him being on Durvalumab.  He has had a little bit of reflux.  I told him to try over-the-counter Pepcid but to take it twice a day.  Of note, his baseline TSH is 2.6.  Currently, I would say that his performance status is probably ECOG 1.   Medications:  Current Outpatient Medications:    albuterol (PROVENTIL) (2.5 MG/3ML) 0.083% nebulizer solution, Take 3 mLs (2.5 mg total) by nebulization every 6 (six) hours as needed for wheezing or shortness of breath., Disp: 75 mL, Rfl: 5   albuterol (VENTOLIN HFA) 108 (90 Base) MCG/ACT inhaler, Inhale 2 puffs into the lungs every 6 (six) hours as needed for wheezing., Disp: 2 each, Rfl: 11   apixaban (ELIQUIS) 5 MG TABS tablet, Take 1 tablet (5 mg total) by mouth 2 (two) times daily., Disp: 60 tablet, Rfl: 12   atorvastatin (LIPITOR) 20 MG tablet, TAKE ONE TABLET BY MOUTH DAILY, Disp: 90 tablet, Rfl: 3   azithromycin (ZITHROMAX) 250 MG tablet, Take 2 tablets on day one then take 1 tablet daily for four additional days, Disp: 6 tablet,  Rfl: 0   benzonatate (TESSALON) 200 MG capsule, Take 1 capsule (200 mg total) by mouth 3 (three) times daily as needed for cough., Disp: 30 capsule, Rfl: 1   Budeson-Glycopyrrol-Formoterol (BREZTRI AEROSPHERE) 160-9-4.8 MCG/ACT AERO, Inhale 2 puffs into the lungs in the morning and at bedtime., Disp: 1 each, Rfl: 11   COENZYME Q10 PO, Take 100 mg by mouth in the morning., Disp: , Rfl:    gabapentin (NEURONTIN) 300 MG capsule, Take 300mg  nightly for 3 nights then increase to 300mg  twice daily. After 1 week, ok to increase to 300mg  three times daily., Disp: 90 capsule, Rfl: 3   GUAIFENESIN 1200 PO, Take 1,200 mg by mouth in the morning and at bedtime., Disp: , Rfl:    lidocaine-prilocaine (EMLA) cream, Apply a dime size to port-a-cath 1-2 hours prior to access. Cover with saran wrap., Disp: 30 g, Rfl: 2   Multiple Vitamin (MULTIVITAMIN) capsule, Take 1 capsule by mouth daily., Disp: , Rfl:    predniSONE (DELTASONE) 10 MG tablet, 4 tabs for 3 days, then 3 tabs for 3 days, 2 tabs for 3 days, then 1 tab for 3 days, then stop, Disp: 30 tablet, Rfl: 0   telmisartan (MICARDIS) 40 MG tablet, Take 1 tablet (40 mg total) by mouth daily., Disp: 30 tablet, Rfl: 4  Allergies: No Known Allergies  Past Medical History, Surgical history, Social history, and Family History were reviewed  and updated.  Review of Systems: Review of Systems  Constitutional: Negative.   HENT:  Negative.    Eyes: Negative.   Respiratory:  Positive for cough.   Cardiovascular: Negative.   Gastrointestinal: Negative.   Endocrine: Negative.   Genitourinary: Negative.    Musculoskeletal: Negative.   Skin: Negative.   Neurological: Negative.   Hematological: Negative.   Psychiatric/Behavioral: Negative.      Physical Exam: His vital signs are temperature 98.3.  Pulse 84.  Blood pressure 122/79.  Weight is 170 pounds.    Wt Readings from Last 3 Encounters:  02/17/23 170 lb (77.1 kg)  02/13/23 177 lb 9.6 oz (80.6 kg)   01/22/23 175 lb 9.6 oz (79.7 kg)    Physical Exam Vitals reviewed.  HENT:     Head: Normocephalic and atraumatic.  Eyes:     Pupils: Pupils are equal, round, and reactive to light.  Cardiovascular:     Rate and Rhythm: Normal rate and regular rhythm.     Heart sounds: Normal heart sounds.  Pulmonary:     Effort: Pulmonary effort is normal.     Breath sounds: Normal breath sounds.  Abdominal:     General: Bowel sounds are normal.     Palpations: Abdomen is soft.  Musculoskeletal:        General: No tenderness or deformity. Normal range of motion.     Cervical back: Normal range of motion.  Lymphadenopathy:     Cervical: No cervical adenopathy.  Skin:    General: Skin is warm and dry.     Findings: No erythema or rash.  Neurological:     Mental Status: He is alert and oriented to person, place, and time.  Psychiatric:        Behavior: Behavior normal.        Thought Content: Thought content normal.        Judgment: Judgment normal.     Lab Results  Component Value Date   WBC 15.3 (H) 02/17/2023   HGB 14.4 02/17/2023   HCT 43.0 02/17/2023   MCV 92.1 02/17/2023   PLT 342 02/17/2023     Chemistry      Component Value Date/Time   NA 135 02/13/2023 1601   K 4.0 02/13/2023 1601   CL 98 02/13/2023 1601   CO2 27 02/13/2023 1601   BUN 11 02/13/2023 1601   CREATININE 1.24 02/13/2023 1601   CREATININE 0.98 01/20/2023 0925   CREATININE 1.05 01/11/2022 0000      Component Value Date/Time   CALCIUM 9.3 02/13/2023 1601   ALKPHOS 48 01/20/2023 0925   AST 18 01/20/2023 0925   ALT 34 01/20/2023 0925   BILITOT 0.4 01/20/2023 0925      Impression and Plan: Mr. Maute is a very nice 57 year old white male.  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  Again, he does not wish to have surgery.  He has seen Dr. Dorris Fetch of thoracic surgery.  He completed radiation/chemotherapy.  He has responded but still, I think, there is significant disease.  He has  had radiosurgery.  I will now have him on immunotherapy.  I would not repeat a PET scan for good 3 months.  I really want to see how the radiosurgery and immunotherapy does for him.  Again, he is on a prednisone taper.  I do not think this will be harmful with respect to him being on immunotherapy.  We will plan to get him back in 1 month.  [name], MD 7/22/202410:18 AM

## 2023-02-18 ENCOUNTER — Encounter: Payer: Self-pay | Admitting: *Deleted

## 2023-02-18 ENCOUNTER — Telehealth: Payer: Self-pay | Admitting: Nurse Practitioner

## 2023-02-18 NOTE — Telephone Encounter (Signed)
Called and spoke with Melissa, [employer]. Melissa stated oxygen was delivered yesterday 02/17/23.  Melissa stated patient ticket stated he received O2 tanks, home concentrator, and home fill. Melissa stated it looked like patient received POC also.  Melissa stated she would call patient and see what he is missing or needing.  Melissa stated she would call back if anything from [facility] is needed.

## 2023-02-18 NOTE — Progress Notes (Signed)
Patient has completed his SBRT. Will not need scans at this time.   Oncology Nurse Navigator Documentation     02/18/2023    8:00 AM  Oncology Nurse Navigator Flowsheets  Phase of Treatment Radiation  Radiation Actual Start Date: 02/04/2023  Radiation Actual End Date: 02/11/2023  Navigator Follow Up Date: 03/17/2023  Navigator Follow Up Reason: Follow-up Appointment  Navigator Location [facility]  Navigator Encounter Type Appt/Treatment Plan Review  Patient Visit Type MedOnc  Treatment Phase Active Tx  Barriers/Navigation Needs No Barriers At This Time  Interventions None Required  Acuity Level 1-No Barriers  Support Groups/Services Friends and Family  Time Spent with Patient 15

## 2023-02-18 NOTE — Telephone Encounter (Signed)
States emergency home oxygen was ordered, has not been delivered.

## 2023-02-19 LAB — SPECIMEN STATUS REPORT

## 2023-02-20 ENCOUNTER — Encounter: Payer: Self-pay | Admitting: Nurse Practitioner

## 2023-02-20 ENCOUNTER — Ambulatory Visit (INDEPENDENT_AMBULATORY_CARE_PROVIDER_SITE_OTHER): Payer: BC Managed Care – PPO | Admitting: Nurse Practitioner

## 2023-02-20 VITALS — BP 112/70 | HR 108 | Ht 65.5 in | Wt 174.2 lb

## 2023-02-20 DIAGNOSIS — J441 Chronic obstructive pulmonary disease with (acute) exacerbation: Secondary | ICD-10-CM | POA: Diagnosis not present

## 2023-02-20 DIAGNOSIS — C3432 Malignant neoplasm of lower lobe, left bronchus or lung: Secondary | ICD-10-CM | POA: Diagnosis not present

## 2023-02-20 DIAGNOSIS — J9611 Chronic respiratory failure with hypoxia: Secondary | ICD-10-CM

## 2023-02-20 NOTE — Progress Notes (Signed)
@Patient  ID: Brandon Phillips., male    DOB: 02-18-1966, 57 y.o.   MRN: [id number]  Chief Complaint  Patient presents with   Follow-up    Pt f/u after flare up. Pt o2 levels still dropping even after pred taper.     Referring provider: [name], NP  HPI: 57 year old male, former smoker followed for NSCLC of left lung and COPD. He is a patient of Dr. [name] and last seen in office 02/13/2023 by [name] NP. Past medical history significant for HTN, allergic rhinitis.   TEST/EVENTS:  07/25/2022 LDCT chest: Atherosclerosis.  Spiculated solid central left lower lobe lung mass measuring 35 mm, new.  Solid peripheral right upper lobe pulmonary nodule measuring 7.9 mm. 08/14/2022 PET scan: Low-level hypermetabolism of the right upper lobe pulmonary nodule.  Hypermetabolism of the left lower lobe lung mass and ipsilateral hilar nodal metastasis. 08/26/2022 PFT: FVC 66, FEV1 42, ratio 48, TLC 122, DLCOcor 57.  No BD. 11/19/2022 CXR: stable left lower lobe mass. New right-sided chest wall port.  12/04/2022 CTA chest: small subsegmental RLL PE. LLL perihilar mass, 2.2x1.8 cm. No LAD. No acute consolidation.  01/01/2023 PET: LLL nodule with intense hypermetabolism despite decrease in size. Hypermetabolic left hilar node. Intense metabolic activity in right shoulder AC joint, no CT lesion evident and favors non-neoplastic activity. No distant metastatic disease. Post radiation change in marrow of lower thoracic spine.  02/13/2023 CXR: unchanged left perihilar mass. No acute process  10/29/2022: OV with Dr. Thora Lance.  Undergoing radiation for stage IIb NSCLC left lower lobe, neoadjuvant chemotherapy with subsequent plan for restaging.  Resection potentially offered though he is hopeful to avoid surgery.  Does feel like his dyspnea has been worse since last visit.  He has had cough with yellow sputum production over the last 6 days.  He also increased pain with swallowing since starting XRT.  Treated for AE COPD  with Z-Pak and prednisone taper.  Switched from Symbicort to [employer].  Advised to call if symptoms did not improve.  11/19/2022: OV with [name] NP for acute visit with his wife.  He was seen 4/2 and treated for a COPD with Z-Pak and prednisone taper.  He tells me that when he was on the prednisone, he was feeling a little bit better.  His wife was not noticing him wheeze.  Since he has finished these medications, he feels like he is more short of breath, has more chest congestion and wheezing and is still producing yellow phlegm.  Cough tends to be worse at night and then in the morning when he first gets up.  He does have some nasal drainage.  Denies any fevers, night sweats, hemoptysis, lower extremity swelling, orthopnea.  No interim sick exposures.  Using Tarkio twice daily.  Does not have a neb machine at home. Eating and drinking well.   12/04/2022:  OV with [name] NP for follow up. We treated him for unresolved acute bronchitis/AECOPD at his last visit with doxycycline course and prednisone taper. He had previously had good response to the steroids but flared back up once off. His CXR did not show an acute process. He was also recommended to start intranasal steroid for postnasal drainage and cough control measures.  Today, he tells me that he is some better but still having some chest congestion and cough. His cough is mostly dry but occasionally producing some white phlegm at times. Does have chest discomfort with the cough at times. His heart rate is up  compared to his last visit. They haven't noticed much wheezing. Breathing feels better but still more short winded than his baseline. He is having some increased voice hoarseness. Denies fevers, chills, hemoptysis, leg swelling, calf pain, palpitations. He is using Clinical cytogeneticist. He's not sure if the Markus Daft is making him cough more and wonders if he should go back to his Symbicort. Doesn't notice an increase cough right after he uses it. He is using flonase but  he thinks this makes him drain more. Still using his neb treatments twice a day, which help some. Not using any cough suppressants as he was worried that he shouldn't be suppressing his cough.  D dimer positive - CTA positive for subsegmental PE. Admitted for treatment. No right heart strain  12/19/2022: OV with Dr. Thora Lance. Started on Eliquis for subsegmental RLL PE. Doing ok now. Quite a bit of chest congestion. Using breztri bid. Advised to use mucinex and flutter valve for chest congestion. Upcoming restaging PET/CT. F/u 8 weeks or sooner.  02/13/2023: OV with [name] NP for intended follow up but he is also struggling with some acute symptoms. He's had increased shortness of breath over the past week or two. He gets winded just walking from the parking lot into the building. He is coughing some more. Producing some white phlegm. He does have some wheezing at times. His wife says that he had some low O2 levels over the weekend, down to 84% with exertion. He denies any fevers, chills, hemoptysis, leg swelling, calf pain, orthopnea, CP. He has not had any missed doses of Eliquis. No excessive bruising or bleeding. He is using his albuterol twice a day and neb twice a week. Still on Breztri.   02/20/2023: Today - follow up Patient presents today for follow up with his wife. He was treated for AECOPD with z pack and prednisone taper, and started on supplemental oxygen at our last visit. He is feeling some better today. Chest feels clearer. He's unsure if he's notice a huge difference in his breathing but his activity tolerance is some improved. Cough is mostly resolved. Not noticing as much wheezing. He's still having drops in his oxygen with activity; SpO2 low 86% on room air. He will put his oxygen on when he notices they're low. Not sleeping with it every night. He has not missed any doses of Eliquis. Labs were nl aside from elevated WBC, likely due to steroid use as CXR was clear.   No Known  Allergies  Immunization History  Administered Date(s) Administered   PFIZER(Purple Top)SARS-COV-2 Vaccination 08/03/2020, 08/25/2020   Pneumococcal Polysaccharide-23 01/15/2018   Tdap 01/15/2018   Zoster Recombinant(Shingrix) 07/30/2019, 11/27/2019    Past Medical History:  Diagnosis Date   Cancer (HCC)    lung cancer   CAP (community acquired pneumonia)    COPD (chronic obstructive pulmonary disease) (HCC)    Eczema    HLD (hyperlipidemia)    Primary cancer of left lower lobe of lung (HCC) 09/17/2022   Tobacco use disorder 01/24/2017    Tobacco History: Social History   Tobacco Use  Smoking Status Former   Current packs/day: 0.00   Average packs/day: 1.5 packs/day for 35.0 years (52.5 ttl pk-yrs)   Types: Cigarettes   Start date: 03/30/1982   Quit date: 03/30/2017   Years since quitting: 5.8  Smokeless Tobacco Never   Counseling given: Not Answered   Outpatient Medications Prior to Visit  Medication Sig Dispense Refill   albuterol (PROVENTIL) (2.5 MG/3ML) 0.083% nebulizer solution Take 3  mLs (2.5 mg total) by nebulization every 6 (six) hours as needed for wheezing or shortness of breath. 75 mL 5   albuterol (VENTOLIN HFA) 108 (90 Base) MCG/ACT inhaler Inhale 2 puffs into the lungs every 6 (six) hours as needed for wheezing. 2 each 11   apixaban (ELIQUIS) 5 MG TABS tablet Take 1 tablet (5 mg total) by mouth 2 (two) times daily. 60 tablet 12   atorvastatin (LIPITOR) 20 MG tablet TAKE ONE TABLET BY MOUTH DAILY 90 tablet 3   azithromycin (ZITHROMAX) 250 MG tablet Take 2 tablets on day one then take 1 tablet daily for four additional days 6 tablet 0   benzonatate (TESSALON) 200 MG capsule Take 1 capsule (200 mg total) by mouth 3 (three) times daily as needed for cough. 30 capsule 1   Budeson-Glycopyrrol-Formoterol (BREZTRI AEROSPHERE) 160-9-4.8 MCG/ACT AERO Inhale 2 puffs into the lungs in the morning and at bedtime. 1 each 11   COENZYME Q10 PO Take 100 mg by mouth in the  morning.     gabapentin (NEURONTIN) 300 MG capsule Take 300mg  nightly for 3 nights then increase to 300mg  twice daily. After 1 week, ok to increase to 300mg  three times daily. 90 capsule 3   GUAIFENESIN 1200 PO Take 1,200 mg by mouth in the morning and at bedtime.     lidocaine-prilocaine (EMLA) cream Apply a dime size to port-a-cath 1-2 hours prior to access. Cover with saran wrap. 30 g 2   Multiple Vitamin (MULTIVITAMIN) capsule Take 1 capsule by mouth daily.     predniSONE (DELTASONE) 10 MG tablet 4 tabs for 3 days, then 3 tabs for 3 days, 2 tabs for 3 days, then 1 tab for 3 days, then stop 30 tablet 0   telmisartan (MICARDIS) 40 MG tablet Take 1 tablet (40 mg total) by mouth daily. 30 tablet 4   No facility-administered medications prior to visit.     Review of Systems:   Constitutional: No weight loss or gain, night sweats, fevers, chills, or lassitude. +fatigue (improved) HEENT: No headaches, difficulty swallowing, tooth/dental problems, or sore throat. No sneezing, itching, ear ache. +nasal congestion, post nasal drip, voice hoarseness  CV:  No chest pain, orthopnea, PND, swelling in lower extremities, anasarca, dizziness, palpitations, syncope Resp: +shortness of breath with exertion (improved); rare wheezing. No cough. No hemoptysis. No chest wall deformity GI:  No heartburn, indigestion, abdominal pain, nausea, vomiting, diarrhea, change in bowel habits, loss of appetite, bloody stools.  GU: No dysuria, change in color of urine, urgency or frequency.   Skin: No rash, lesions, ulcerations MSK:  No joint pain or swelling.   Neuro: No dizziness or lightheadedness.  Psych: No depression or anxiety. Mood stable.     Physical Exam:  BP 112/70   Pulse (!) 108   Ht 5' 5.5" (1.664 m)   Wt 174 lb 3.2 oz (79 kg)   SpO2 95%   BMI 28.55 kg/m   GEN: Pleasant, interactive, well-kempt; in no acute distress. HEENT:  Normocephalic and atraumatic. PERRLA. Sclera white. Nasal turbinates  pink, moist and patent bilaterally. No rhinorrhea present. Oropharynx pink and moist, without exudate or edema. No lesions, ulcerations, or postnasal drip.  NECK:  Supple w/ fair ROM. No JVD present. Normal carotid impulses w/o bruits. Thyroid symmetrical with no goiter or nodules palpated. No lymphadenopathy.   CV: RRR, no m/r/g, no peripheral edema. Pulses intact, +2 bilaterally. No cyanosis, pallor or clubbing. PULMONARY:  Unlabored, regular breathing. Clear bilaterally A&P w/o wheezes/rales/rhonchi. No  accessory muscle use.  GI: BS present and normoactive. Soft, non-tender to palpation. No organomegaly or masses detected.  MSK: No erythema, warmth or tenderness. Cap refil <2 sec all extrem. No deformities or joint swelling noted.  Neuro: A/Ox3. No focal deficits noted.   Skin: Warm, no lesions or rashe Psych: Normal affect and behavior. Judgement and thought content appropriate.     Lab Results:  CBC    Component Value Date/Time   WBC 15.3 (H) 02/17/2023 0930   WBC 6.4 02/13/2023 1601   RBC 4.67 02/17/2023 0930   HGB 14.4 02/17/2023 0930   HCT 43.0 02/17/2023 0930   PLT 342 02/17/2023 0930   MCV 92.1 02/17/2023 0930   MCH 30.8 02/17/2023 0930   MCHC 33.5 02/17/2023 0930   RDW 12.7 02/17/2023 0930   LYMPHSABS 0.4 (L) 02/17/2023 0930   MONOABS 1.0 02/17/2023 0930   EOSABS 0.0 02/17/2023 0930   BASOSABS 0.0 02/17/2023 0930    BMET    Component Value Date/Time   NA 136 02/17/2023 0930   K 3.7 02/17/2023 0930   CL 100 02/17/2023 0930   CO2 26 02/17/2023 0930   GLUCOSE 212 (H) 02/17/2023 0930   BUN 17 02/17/2023 0930   CREATININE 0.87 02/17/2023 0930   CREATININE 1.05 01/11/2022 0000   CALCIUM 9.5 02/17/2023 0930   GFRNONAA >60 02/17/2023 0930   GFRNONAA 88 04/06/2020 0708   GFRAA 102 04/06/2020 0708    BNP    Component Value Date/Time   BNP 87.1 02/17/2023 0930     Imaging:  DG Chest 2 View  Result Date: 02/13/2023 CLINICAL DATA:  Productive cough, hypoxia  EXAM: CHEST - 2 VIEW COMPARISON:  chest x-ray November 19, 2022 FINDINGS: Right chest wall port in place, terminating near the superior cavoatrial junction. The cardiomediastinal silhouette is unchanged in contour. Unchanged left perihilar mass. No acute focal pulmonary opacity. No pleural effusion or pneumothorax. The visualized upper abdomen is unremarkable. No acute osseous abnormality. IMPRESSION: Unchanged left perihilar mass. No acute cardiopulmonary abnormality. Electronically Signed   By: Jacob Moores M.D.   On: 02/13/2023 15:17    durvalumab (IMFINZI) 1,500 mg in sodium chloride 0.9 % 100 mL chemo infusion     Date Action Dose Route User   02/17/2023 1210 Rate/Dose Change (none) Intravenous Rozell Searing, RN   02/17/2023 1210 Rate/Dose Change (none) Intravenous Rozell Searing, RN   02/17/2023 1110 Rate/Dose Change (none) Intravenous Rozell Searing, RN   02/17/2023 1110 New Bag/Given 1,500 mg Intravenous Rozell Searing, RN      heparin lock flush 100 unit/mL     Date Action Dose Route User   02/17/2023 1223 Given 500 Units Intracatheter Rozell Searing, RN      0.9 %  sodium chloride infusion     Date Action Dose Route User   02/17/2023 1220 Rate/Dose Change (none) Intravenous Rozell Searing, RN   02/17/2023 1213 Rate/Dose Change (none) Intravenous Rozell Searing, RN   02/17/2023 1047 New Bag/Given (none) Intravenous Cooper Render, RN      sodium chloride flush (NS) 0.9 % injection 10 mL     Date Action Dose Route User   02/17/2023 1223 Given 10 mL Intracatheter Poindexter, Irena Reichmann, RN          Latest Ref Rng & Units 08/26/2022    3:25 PM  PFT Results  FVC-Pre L 2.75   FVC-Predicted Pre % 66   FVC-Post L 2.77   FVC-Predicted Post %  67   Pre FEV1/FVC % % 48   Post FEV1/FCV % % 48   FEV1-Pre L 1.33   FEV1-Predicted Pre % 42   FEV1-Post L 1.33   DLCO uncorrected ml/min/mmHg 15.07   DLCO UNC% % 61   DLCO corrected ml/min/mmHg  14.04   DLCO COR %Predicted % 57   DLVA Predicted % 60   TLC L 7.30   TLC % Predicted % 122   RV % Predicted % 235     No results found for: "NITRICOXIDE"      Assessment & Plan:   COPD with acute exacerbation (HCC) Resolving exacerbation. Clinically improved today. He is still requiring supplemental O2 with exertion but was able to maintain on 2-3 lpm today, down from 4 lpm. Continue triple therapy regimen. Action plan in place.   Patient Instructions  Continue Albuterol inhaler 2 puffs or 3 mL neb every 6 hours as needed for shortness of breath or wheezing. Use nebs 2-4 times a day until symptoms improve. Follow with flutter valve Continue Breztri 2 puffs Twice daily. Brush tongue and rinse mouth afterwards  Continue guaifenesin 1200 mg Twice daily for chest congestion/cough Continue Flonase nasal spray 2 sprays each nostril daily for nasal congestion/drainage  Continue Eliquis Twice daily  Continue supplemental oxygen 3 lpm on POC with activity and 2 lpm continuous at night for goal >88-90%. We will have you complete an overnight oxygen study on supplemental oxygen. They will mail this to you   Follow up with oncology as scheduled   Follow up in 6 weeks with Dr. Francine Graven or other new pulmonologist. If symptoms do not improve or worsen, please contact office for sooner follow up or seek emergency care.    Chronic respiratory failure with hypoxia (HCC) Likely multifactorial. See above. Will reassess at follow up. He will continue to use supplemental oxygen with exertion and at night. Resent orders for POC today as he never received this. Goal >88-90%  Primary cancer of left lower lobe of lung (HCC) Recently started immunotherapy. Tolerating well thus far. Follow up with Dr. Myna Hidalgo as scheduled.     I spent 35 minutes of dedicated to the care of this patient on the date of this encounter to include pre-visit review of records, face-to-face time with the patient discussing  conditions above, post visit ordering of testing, clinical documentation with the electronic health record, making appropriate referrals as documented, and communicating necessary findings to members of the patients care team.  [name], NP 02/20/2023  Pt aware and understands NP's role.

## 2023-02-20 NOTE — Assessment & Plan Note (Signed)
Recently started immunotherapy. Tolerating well thus far. Follow up with Dr. Myna Hidalgo as scheduled.

## 2023-02-20 NOTE — Patient Instructions (Addendum)
Continue Albuterol inhaler 2 puffs or 3 mL neb every 6 hours as needed for shortness of breath or wheezing. Use nebs 2-4 times a day until symptoms improve. Follow with flutter valve Continue Breztri 2 puffs Twice daily. Brush tongue and rinse mouth afterwards  Continue guaifenesin 1200 mg Twice daily for chest congestion/cough Continue Flonase nasal spray 2 sprays each nostril daily for nasal congestion/drainage  Continue Eliquis Twice daily  Continue supplemental oxygen 3 lpm on POC with activity and 2 lpm continuous at night for goal >88-90%. We will have you complete an overnight oxygen study on supplemental oxygen. They will mail this to you   Follow up with oncology as scheduled   Follow up in 6 weeks with Dr. Francine Graven or other new pulmonologist. If symptoms do not improve or worsen, please contact office for sooner follow up or seek emergency care.

## 2023-02-20 NOTE — Assessment & Plan Note (Signed)
Resolving exacerbation. Clinically improved today. He is still requiring supplemental O2 with exertion but was able to maintain on 2-3 lpm today, down from 4 lpm. Continue triple therapy regimen. Action plan in place.   Patient Instructions  Continue Albuterol inhaler 2 puffs or 3 mL neb every 6 hours as needed for shortness of breath or wheezing. Use nebs 2-4 times a day until symptoms improve. Follow with flutter valve Continue Breztri 2 puffs Twice daily. Brush tongue and rinse mouth afterwards  Continue guaifenesin 1200 mg Twice daily for chest congestion/cough Continue Flonase nasal spray 2 sprays each nostril daily for nasal congestion/drainage  Continue Eliquis Twice daily  Continue supplemental oxygen 3 lpm on POC with activity and 2 lpm continuous at night for goal >88-90%. We will have you complete an overnight oxygen study on supplemental oxygen. They will mail this to you   Follow up with oncology as scheduled   Follow up in 6 weeks with Dr. Francine Graven or other new pulmonologist. If symptoms do not improve or worsen, please contact office for sooner follow up or seek emergency care.

## 2023-02-20 NOTE — Assessment & Plan Note (Signed)
Likely multifactorial. See above. Will reassess at follow up. He will continue to use supplemental oxygen with exertion and at night. Resent orders for POC today as he never received this. Goal >88-90%

## 2023-02-24 DIAGNOSIS — E785 Hyperlipidemia, unspecified: Secondary | ICD-10-CM | POA: Diagnosis not present

## 2023-02-24 DIAGNOSIS — R490 Dysphonia: Secondary | ICD-10-CM | POA: Diagnosis not present

## 2023-02-24 DIAGNOSIS — J449 Chronic obstructive pulmonary disease, unspecified: Secondary | ICD-10-CM | POA: Diagnosis not present

## 2023-03-03 NOTE — Telephone Encounter (Signed)
Message received from patient requesting a work note.  Could you please fax a note to my works HR department [employer] [phone]) saying that I need to use my oxygen concentrator when my oxygen levels drop to a certain level. Thank so much Brandon Phillips    Message routed to Valentina Lucks (Beth,NP is covering her in box this week)

## 2023-03-10 ENCOUNTER — Other Ambulatory Visit: Payer: Self-pay | Admitting: Hematology & Oncology

## 2023-03-17 ENCOUNTER — Inpatient Hospital Stay: Payer: BC Managed Care – PPO

## 2023-03-17 ENCOUNTER — Encounter: Payer: Self-pay | Admitting: Hematology & Oncology

## 2023-03-17 ENCOUNTER — Inpatient Hospital Stay: Payer: BC Managed Care – PPO | Attending: Hematology & Oncology

## 2023-03-17 ENCOUNTER — Inpatient Hospital Stay: Payer: BC Managed Care – PPO | Admitting: Hematology & Oncology

## 2023-03-17 VITALS — BP 115/77 | HR 81 | Resp 18

## 2023-03-17 DIAGNOSIS — C3432 Malignant neoplasm of lower lobe, left bronchus or lung: Secondary | ICD-10-CM

## 2023-03-17 DIAGNOSIS — Z79620 Long term (current) use of immunosuppressive biologic: Secondary | ICD-10-CM | POA: Diagnosis not present

## 2023-03-17 DIAGNOSIS — Z79899 Other long term (current) drug therapy: Secondary | ICD-10-CM | POA: Diagnosis not present

## 2023-03-17 DIAGNOSIS — Z5112 Encounter for antineoplastic immunotherapy: Secondary | ICD-10-CM | POA: Diagnosis not present

## 2023-03-17 LAB — CBC WITH DIFFERENTIAL (CANCER CENTER ONLY)
Abs Immature Granulocytes: 0.03 10*3/uL (ref 0.00–0.07)
Basophils Absolute: 0 10*3/uL (ref 0.0–0.1)
Basophils Relative: 1 %
Eosinophils Absolute: 0.2 10*3/uL (ref 0.0–0.5)
Eosinophils Relative: 3 %
HCT: 42.6 % (ref 39.0–52.0)
Hemoglobin: 14.2 g/dL (ref 13.0–17.0)
Immature Granulocytes: 1 %
Lymphocytes Relative: 7 %
Lymphs Abs: 0.5 10*3/uL — ABNORMAL LOW (ref 0.7–4.0)
MCH: 29.8 pg (ref 26.0–34.0)
MCHC: 33.3 g/dL (ref 30.0–36.0)
MCV: 89.3 fL (ref 80.0–100.0)
Monocytes Absolute: 0.7 10*3/uL (ref 0.1–1.0)
Monocytes Relative: 11 %
Neutro Abs: 5.1 10*3/uL (ref 1.7–7.7)
Neutrophils Relative %: 77 %
Platelet Count: 296 10*3/uL (ref 150–400)
RBC: 4.77 MIL/uL (ref 4.22–5.81)
RDW: 13.2 % (ref 11.5–15.5)
WBC Count: 6.5 10*3/uL (ref 4.0–10.5)
nRBC: 0 % (ref 0.0–0.2)

## 2023-03-17 LAB — CMP (CANCER CENTER ONLY)
ALT: 19 U/L (ref 0–44)
AST: 15 U/L (ref 15–41)
Albumin: 4.1 g/dL (ref 3.5–5.0)
Alkaline Phosphatase: 56 U/L (ref 38–126)
Anion gap: 8 (ref 5–15)
BUN: 12 mg/dL (ref 6–20)
CO2: 31 mmol/L (ref 22–32)
Calcium: 9.1 mg/dL (ref 8.9–10.3)
Chloride: 98 mmol/L (ref 98–111)
Creatinine: 0.93 mg/dL (ref 0.61–1.24)
GFR, Estimated: >60 mL/min (ref >60)
Glucose, Bld: 110 mg/dL — ABNORMAL HIGH (ref 70–99)
Potassium: 3.9 mmol/L (ref 3.5–5.1)
Sodium: 137 mmol/L (ref 135–145)
Total Bilirubin: 0.3 mg/dL (ref 0.3–1.2)
Total Protein: 7.2 g/dL (ref 6.5–8.1)

## 2023-03-17 LAB — TSH: TSH: 2.079 u[IU]/mL (ref 0.350–4.500)

## 2023-03-17 LAB — LACTATE DEHYDROGENASE: LDH: 155 U/L (ref 98–192)

## 2023-03-17 MED ORDER — SODIUM CHLORIDE 0.9% FLUSH
10.0000 mL | INTRAVENOUS | Status: DC | PRN
  Administered 2023-03-17: 10 mL

## 2023-03-17 MED ORDER — SODIUM CHLORIDE 0.9 % IV SOLN: Freq: Once | INTRAVENOUS | Status: AC

## 2023-03-17 MED ORDER — SODIUM CHLORIDE 0.9 % IV SOLN
1500.0000 mg | Freq: Once | INTRAVENOUS | Status: AC
  Administered 2023-03-17: 1500 mg via INTRAVENOUS
  Filled 2023-03-17: qty 30

## 2023-03-17 MED ORDER — HEPARIN SOD (PORK) LOCK FLUSH 100 UNIT/ML IV SOLN
500.0000 [IU] | Freq: Once | INTRAVENOUS | Status: AC | PRN
  Administered 2023-03-17: 500 [IU]

## 2023-03-17 NOTE — Patient Instructions (Signed)

## 2023-03-17 NOTE — Patient Instructions (Signed)
[facility]  Discharge Instructions: Thank you for choosing [facility] to provide your oncology and hematology care.   If you have a lab appointment with the Cancer Center, please go directly to the Cancer Center and check in at the registration area.  Wear comfortable clothing and clothing appropriate for easy access to any Portacath or PICC line.   We strive to give you quality time with your provider. You may need to reschedule your appointment if you arrive late (15 or more minutes).  Arriving late affects you and other patients whose appointments are after yours.  Also, if you miss three or more appointments without notifying the office, you may be dismissed from the clinic at the provider's discretion.      For prescription refill requests, have your pharmacy contact our office and allow 72 hours for refills to be completed.    Today you received the following chemotherapy and/or immunotherapy agents Durvalumab       To help prevent nausea and vomiting after your treatment, we encourage you to take your nausea medication as directed.  BELOW ARE SYMPTOMS THAT SHOULD BE REPORTED IMMEDIATELY: *FEVER GREATER THAN 100.4 F (38 C) OR HIGHER *CHILLS OR SWEATING *NAUSEA AND VOMITING THAT IS NOT CONTROLLED WITH YOUR NAUSEA MEDICATION *UNUSUAL SHORTNESS OF BREATH *UNUSUAL BRUISING OR BLEEDING *URINARY PROBLEMS (pain or burning when urinating, or frequent urination) *BOWEL PROBLEMS (unusual diarrhea, constipation, pain near the anus) TENDERNESS IN MOUTH AND THROAT WITH OR WITHOUT PRESENCE OF ULCERS (sore throat, sores in mouth, or a toothache) UNUSUAL RASH, SWELLING OR PAIN  UNUSUAL VAGINAL DISCHARGE OR ITCHING   Items with * indicate a potential emergency and should be followed up as soon as possible or go to the Emergency Department if any problems should occur.  Please show the CHEMOTHERAPY ALERT CARD or IMMUNOTHERAPY ALERT CARD at  check-in to the Emergency Department and triage nurse. Should you have questions after your visit or need to cancel or reschedule your appointment, please contact [facility] [place]  [phone] and follow the prompts.  Office hours are 8:00 a.m. to 4:30 p.m. Monday - Friday. Please note that voicemails left after 4:00 p.m. may not be returned until the following business day.  We are closed weekends and major holidays. You have access to a nurse at all times for urgent questions. Please call the main number to the clinic [phone] and follow the prompts.  For any non-urgent questions, you may also contact your provider using MyChart. We now offer e-Visits for anyone 57 and older to request care online for non-urgent symptoms. For details visit [email or web address].   Also download the MyChart app! Go to the app store, search "MyChart", open the app, select [facility], and log in with your MyChart username and password.

## 2023-03-17 NOTE — Progress Notes (Signed)
Hematology and Oncology Follow Up Visit  Nequan Franchina [id number] Mar 07, 1966 57 y.o. 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 twice daily-start on 12/06/2022 Status post radiosurgery-completed on 02/11/2023 Durvalumab 1500 mg IV monthly-s/p cycle 1-- start on 02/17/2023     Interim History:  Mr. Borenstein is back for follow-up.  He did complete radiosurgery for the residual lung disease.  He completed 3 treatments on 02/11/2023.  He did well with this.  He is still having some breathing difficulties.  He does have a portable oxygen tank.  He is still working.  He does get short of breath little more easily.  He is on nebulizers and inhalers.  He has had no cough.  There is no hemoptysis.  He is on Eliquis for the subsegmental pulmonary embolus.  At some point, we will have to evaluate this.  He has had no fever.  There has been no diarrhea.  He has had no rashes.  His last TSH in July was 0.94.  Overall, I would say that his performance status is probably ECOG 1.   Medications:  Current Outpatient Medications:    albuterol (PROVENTIL) (2.5 MG/3ML) 0.083% nebulizer solution, Take 3 mLs (2.5 mg total) by nebulization every 6 (six) hours as needed for wheezing or shortness of breath., Disp: 75 mL, Rfl: 5   albuterol (VENTOLIN HFA) 108 (90 Base) MCG/ACT inhaler, Inhale 2 puffs into the lungs every 6 (six) hours as needed for wheezing., Disp: 2 each, Rfl: 11   apixaban (ELIQUIS) 5 MG TABS tablet, Take 1 tablet (5 mg total) by mouth 2 (two) times daily., Disp: 60 tablet, Rfl: 12   atorvastatin (LIPITOR) 20 MG tablet, TAKE ONE TABLET BY MOUTH DAILY, Disp: 90 tablet, Rfl: 3   Budeson-Glycopyrrol-Formoterol (BREZTRI AEROSPHERE) 160-9-4.8 MCG/ACT AERO, Inhale 2  puffs into the lungs in the morning and at bedtime., Disp: 1 each, Rfl: 11   COENZYME Q10 PO, Take 100 mg by mouth in the morning., Disp: , Rfl:    gabapentin (NEURONTIN) 300 MG capsule, Take 300mg  nightly for 3 nights then increase to 300mg  twice daily. After 1 week, ok to increase to 300mg  three times daily., Disp: 90 capsule, Rfl: 3   GUAIFENESIN 1200 PO, Take 1,200 mg by mouth in the morning and at bedtime., Disp: , Rfl:    Multiple Vitamin (MULTIVITAMIN) capsule, Take 1 capsule by mouth daily., Disp: , Rfl:    telmisartan (MICARDIS) 40 MG tablet, TAKE 1 TABLET BY MOUTH DAILY, Disp: 30 tablet, Rfl: 4   benzonatate (TESSALON) 200 MG capsule, Take 1 capsule (200 mg total) by mouth 3 (three) times daily as needed for cough. (Patient not taking: Reported on 03/17/2023), Disp: 30 capsule, Rfl: 1   lidocaine-prilocaine (EMLA) cream, Apply a dime size to port-a-cath 1-2 hours prior to access. Cover with saran wrap. (Patient not taking: Reported on 03/17/2023), Disp: 30 g, Rfl: 2  Allergies: No Known Allergies  Past Medical History, Surgical history, Social history, and Family History were reviewed and updated.  Review of Systems: Review of Systems  Constitutional: Negative.   HENT:  Negative.    Eyes: Negative.   Respiratory:  Positive for cough.   Cardiovascular: Negative.   Gastrointestinal: Negative.   Endocrine: Negative.   Genitourinary: Negative.  Musculoskeletal: Negative.   Skin: Negative.   Neurological: Negative.   Hematological: Negative.   Psychiatric/Behavioral: Negative.      Physical Exam: His vital signs are temperature 98.8.  Pulse 86.  Blood pressure 109/83.  Weight is 173 pounds.     Wt Readings from Last 3 Encounters:  03/17/23 173 lb (78.5 kg)  02/20/23 174 lb 3.2 oz (79 kg)  02/17/23 170 lb (77.1 kg)    Physical Exam Vitals reviewed.  HENT:     Head: Normocephalic and atraumatic.  Eyes:     Pupils: Pupils are equal, round, and reactive to light.   Cardiovascular:     Rate and Rhythm: Normal rate and regular rhythm.     Heart sounds: Normal heart sounds.  Pulmonary:     Effort: Pulmonary effort is normal.     Breath sounds: Normal breath sounds.  Abdominal:     General: Bowel sounds are normal.     Palpations: Abdomen is soft.  Musculoskeletal:        General: No tenderness or deformity. Normal range of motion.     Cervical back: Normal range of motion.  Lymphadenopathy:     Cervical: No cervical adenopathy.  Skin:    General: Skin is warm and dry.     Findings: No erythema or rash.  Neurological:     Mental Status: He is alert and oriented to person, place, and time.  Psychiatric:        Behavior: Behavior normal.        Thought Content: Thought content normal.        Judgment: Judgment normal.     Lab Results  Component Value Date   WBC 6.5 03/17/2023   HGB 14.2 03/17/2023   HCT 42.6 03/17/2023   MCV 89.3 03/17/2023   PLT 296 03/17/2023     Chemistry      Component Value Date/Time   NA 137 03/17/2023 0955   K 3.9 03/17/2023 0955   CL 98 03/17/2023 0955   CO2 31 03/17/2023 0955   BUN 12 03/17/2023 0955   CREATININE 0.93 03/17/2023 0955   CREATININE 1.05 01/11/2022 0000      Component Value Date/Time   CALCIUM 9.1 03/17/2023 0955   ALKPHOS 56 03/17/2023 0955   AST 15 03/17/2023 0955   ALT 19 03/17/2023 0955   BILITOT 0.3 03/17/2023 0955      Impression and Plan: Mr. Slover is a very nice 57 year old white male.  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  Again, he does not wish to have surgery.  He has seen Dr. Dorris Fetch of thoracic surgery.  He completed radiation/chemotherapy.  He has responded but still, I think, there is residual disease.  He has had radiosurgery.  I will now have him on immunotherapy.  He will have a second cycle of Durvalumab today.  I probably would repeat another scan on him after the fourth cycle.  He will continue on the Eliquis.  We will plan to  get him back in 1 month.   [name], MD 8/19/202411:10 AM

## 2023-03-18 ENCOUNTER — Encounter: Payer: Self-pay | Admitting: *Deleted

## 2023-03-18 NOTE — Progress Notes (Signed)
Patient completed radiosurgery and will continue on immunotherapy. He received cycle two. He will need scans after cycle four.   Oncology Nurse Navigator Documentation     03/18/2023    9:00 AM  Oncology Nurse Navigator Flowsheets  Navigator Follow Up Date: 04/14/2023  Navigator Follow Up Reason: Follow-up Appointment;Chemotherapy  Navigator Location [facility]  Navigator Encounter Type Appt/Treatment Plan Review  Patient Visit Type MedOnc  Treatment Phase Active Tx  Barriers/Navigation Needs No Barriers At This Time  Interventions None Required  Acuity Level 1-No Barriers  Support Groups/Services Friends and Family  Time Spent with Patient 15

## 2023-03-20 DIAGNOSIS — R490 Dysphonia: Secondary | ICD-10-CM | POA: Diagnosis not present

## 2023-03-20 DIAGNOSIS — J449 Chronic obstructive pulmonary disease, unspecified: Secondary | ICD-10-CM | POA: Diagnosis not present

## 2023-03-20 DIAGNOSIS — E785 Hyperlipidemia, unspecified: Secondary | ICD-10-CM | POA: Diagnosis not present

## 2023-03-27 DIAGNOSIS — J449 Chronic obstructive pulmonary disease, unspecified: Secondary | ICD-10-CM | POA: Diagnosis not present

## 2023-03-27 DIAGNOSIS — E785 Hyperlipidemia, unspecified: Secondary | ICD-10-CM | POA: Diagnosis not present

## 2023-03-27 DIAGNOSIS — R490 Dysphonia: Secondary | ICD-10-CM | POA: Diagnosis not present

## 2023-04-09 ENCOUNTER — Other Ambulatory Visit: Payer: Self-pay | Admitting: Medical-Surgical

## 2023-04-09 DIAGNOSIS — J449 Chronic obstructive pulmonary disease, unspecified: Secondary | ICD-10-CM

## 2023-04-14 ENCOUNTER — Inpatient Hospital Stay: Payer: BC Managed Care – PPO

## 2023-04-14 ENCOUNTER — Inpatient Hospital Stay (HOSPITAL_BASED_OUTPATIENT_CLINIC_OR_DEPARTMENT_OTHER): Payer: BC Managed Care – PPO | Admitting: Hematology & Oncology

## 2023-04-14 ENCOUNTER — Inpatient Hospital Stay: Payer: BC Managed Care – PPO | Attending: Hematology & Oncology

## 2023-04-14 VITALS — BP 111/72 | HR 80 | Temp 98.0°F | Resp 17 | Ht 65.0 in | Wt 176.0 lb

## 2023-04-14 DIAGNOSIS — I2782 Chronic pulmonary embolism: Secondary | ICD-10-CM

## 2023-04-14 DIAGNOSIS — Z79899 Other long term (current) drug therapy: Secondary | ICD-10-CM | POA: Insufficient documentation

## 2023-04-14 DIAGNOSIS — Z79620 Long term (current) use of immunosuppressive biologic: Secondary | ICD-10-CM | POA: Insufficient documentation

## 2023-04-14 DIAGNOSIS — Z5112 Encounter for antineoplastic immunotherapy: Secondary | ICD-10-CM | POA: Diagnosis present

## 2023-04-14 DIAGNOSIS — C3432 Malignant neoplasm of lower lobe, left bronchus or lung: Secondary | ICD-10-CM | POA: Diagnosis not present

## 2023-04-14 DIAGNOSIS — I2609 Other pulmonary embolism with acute cor pulmonale: Secondary | ICD-10-CM | POA: Diagnosis not present

## 2023-04-14 LAB — CMP (CANCER CENTER ONLY)
ALT: 21 U/L (ref 0–44)
AST: 14 U/L — ABNORMAL LOW (ref 15–41)
Albumin: 4.4 g/dL (ref 3.5–5.0)
Alkaline Phosphatase: 49 U/L (ref 38–126)
Anion gap: 8 (ref 5–15)
BUN: 12 mg/dL (ref 6–20)
CO2: 31 mmol/L (ref 22–32)
Calcium: 9.2 mg/dL (ref 8.9–10.3)
Chloride: 97 mmol/L — ABNORMAL LOW (ref 98–111)
Creatinine: 0.91 mg/dL (ref 0.61–1.24)
GFR, Estimated: >60 mL/min (ref >60)
Glucose, Bld: 108 mg/dL — ABNORMAL HIGH (ref 70–99)
Potassium: 3.9 mmol/L (ref 3.5–5.1)
Sodium: 136 mmol/L (ref 135–145)
Total Bilirubin: 0.5 mg/dL (ref 0.3–1.2)
Total Protein: 7.1 g/dL (ref 6.5–8.1)

## 2023-04-14 LAB — CBC WITH DIFFERENTIAL (CANCER CENTER ONLY)
Abs Immature Granulocytes: 0.03 10*3/uL (ref 0.00–0.07)
Basophils Absolute: 0.1 10*3/uL (ref 0.0–0.1)
Basophils Relative: 1 %
Eosinophils Absolute: 0.2 10*3/uL (ref 0.0–0.5)
Eosinophils Relative: 2 %
HCT: 43.4 % (ref 39.0–52.0)
Hemoglobin: 14.4 g/dL (ref 13.0–17.0)
Immature Granulocytes: 0 %
Lymphocytes Relative: 7 %
Lymphs Abs: 0.6 10*3/uL — ABNORMAL LOW (ref 0.7–4.0)
MCH: 28.7 pg (ref 26.0–34.0)
MCHC: 33.2 g/dL (ref 30.0–36.0)
MCV: 86.6 fL (ref 80.0–100.0)
Monocytes Absolute: 0.8 10*3/uL (ref 0.1–1.0)
Monocytes Relative: 10 %
Neutro Abs: 6.4 10*3/uL (ref 1.7–7.7)
Neutrophils Relative %: 80 %
Platelet Count: 237 10*3/uL (ref 150–400)
RBC: 5.01 MIL/uL (ref 4.22–5.81)
RDW: 13.8 % (ref 11.5–15.5)
WBC Count: 8 10*3/uL (ref 4.0–10.5)
nRBC: 0 % (ref 0.0–0.2)

## 2023-04-14 LAB — LACTATE DEHYDROGENASE: LDH: 138 U/L (ref 98–192)

## 2023-04-14 LAB — TSH: TSH: 2.661 u[IU]/mL (ref 0.350–4.500)

## 2023-04-14 MED ORDER — SODIUM CHLORIDE 0.9% FLUSH
10.0000 mL | INTRAVENOUS | Status: DC | PRN
  Administered 2023-04-14: 10 mL

## 2023-04-14 MED ORDER — HEPARIN SOD (PORK) LOCK FLUSH 100 UNIT/ML IV SOLN
500.0000 [IU] | Freq: Once | INTRAVENOUS | Status: AC | PRN
  Administered 2023-04-14: 500 [IU]

## 2023-04-14 MED ORDER — SODIUM CHLORIDE 0.9 % IV SOLN: Freq: Once | INTRAVENOUS | Status: AC

## 2023-04-14 MED ORDER — SODIUM CHLORIDE 0.9 % IV SOLN
1500.0000 mg | Freq: Once | INTRAVENOUS | Status: AC
  Administered 2023-04-14: 1500 mg via INTRAVENOUS
  Filled 2023-04-14: qty 30

## 2023-04-14 NOTE — Progress Notes (Signed)
Hematology and Oncology Follow Up Visit  Brandon Brandon Phillips [id number] 02-22-1966 57 y.o. 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 twice daily-start on 12/06/2022 Status post radiosurgery-completed on 02/11/2023 Durvalumab 1500 mg IV monthly-s/p cycle #2-- start on 02/17/2023     Interim History:  Brandon Brandon Phillips is back for follow-up.  Unfortunately, there is some problems at work.  Apparently the Brandon Brandon Phillips go from work because of his treatments.  We will have to address this with his work.  I do not see any problems with him going back to work.  He is only on immunotherapy.  He has done well with the immunotherapy.  He is on supplemental oxygen on occasion.  This may be actually from the pulmonary embolism that he developed.  There  also should not be a problem for [place].  I think it is time for [place] to do another CT angiogram on him so we can see if the blood clot has resolved.  He continues on Eliquis.  He has had no change in bowel or bladder habits.  He has had no nausea or vomiting.  He does have inhalers and nebulizers.  I think this might be secondary to underlying lung disease.  He has had no fever.  He has had no bleeding.  Currently, I would say performance status is probably ECOG 1.     Medications:  Current Outpatient Medications:    albuterol (PROVENTIL) (2.5 MG/3ML) 0.083% nebulizer solution, Take 3 mLs (2.5 mg total) by nebulization every 6 (six) hours as needed for wheezing or shortness of breath., Disp: 75 mL, Rfl: 5   albuterol (VENTOLIN HFA) 108 (90 Base) MCG/ACT inhaler, Inhale 2 puffs into the lungs every 6 (six) hours as needed for wheezing., Disp: 2 each, Rfl: 11   apixaban (ELIQUIS) 5 MG TABS tablet, Take 1 tablet (5 mg total) by mouth 2 (two) times  daily., Disp: 60 tablet, Rfl: 12   atorvastatin (LIPITOR) 20 MG tablet, TAKE ONE TABLET BY MOUTH DAILY, Disp: 90 tablet, Rfl: 3   benzonatate (TESSALON) 200 MG capsule, Take 1 capsule (200 mg total) by mouth 3 (three) times daily as needed for cough., Disp: 30 capsule, Rfl: 1   Budeson-Glycopyrrol-Formoterol (BREZTRI AEROSPHERE) 160-9-4.8 MCG/ACT AERO, Inhale 2 puffs into the lungs in the morning and at bedtime., Disp: 1 each, Rfl: 11   COENZYME Q10 PO, Take 100 mg by mouth in the morning., Disp: , Rfl:    gabapentin (NEURONTIN) 300 MG capsule, Take 300mg  nightly for 3 nights then increase to 300mg  twice daily. After 1 week, ok to increase to 300mg  three times daily., Disp: 90 capsule, Rfl: 3   GUAIFENESIN 1200 PO, Take 1,200 mg by mouth in the morning and at bedtime., Disp: , Rfl:    lidocaine-prilocaine (EMLA) cream, Apply a dime size to port-a-cath 1-2 hours prior to access. Cover with saran wrap., Disp: 30 g, Rfl: 2   Multiple Vitamin (MULTIVITAMIN) capsule, Take 1 capsule by mouth daily., Disp: , Rfl:    telmisartan (MICARDIS) 40 MG tablet, TAKE 1 TABLET BY MOUTH DAILY, Disp: 30 tablet, Rfl: 4  Allergies: No Known Allergies  Past Medical History, Surgical history, Social history, and Family History were reviewed and updated.  Review of Systems: Review of Systems  Constitutional: Negative.  HENT:  Negative.    Eyes: Negative.   Respiratory:  Positive for cough.   Cardiovascular: Negative.   Gastrointestinal: Negative.   Endocrine: Negative.   Genitourinary: Negative.    Musculoskeletal: Negative.   Skin: Negative.   Neurological: Negative.   Hematological: Negative.   Psychiatric/Behavioral: Negative.      Physical Exam: His vital signs are temperature 98.0.  Pulse 80.  Blood pressure 111/72.  Weight is 176 pounds.  Oxygen saturation on room air is 94%.   .     Wt Readings from Last 3 Encounters:  04/14/23 176 lb (79.8 kg)  03/17/23 173 lb (78.5 kg)  02/20/23 174 lb 3.2  oz (79 kg)    Physical Exam Vitals reviewed.  HENT:     Head: Normocephalic and atraumatic.  Eyes:     Pupils: Pupils are equal, round, and reactive to light.  Cardiovascular:     Rate and Rhythm: Normal rate and regular rhythm.     Heart sounds: Normal heart sounds.  Pulmonary:     Effort: Pulmonary effort is normal.     Breath sounds: Normal breath sounds.  Abdominal:     General: Bowel sounds are normal.     Palpations: Abdomen is soft.  Musculoskeletal:        General: No tenderness or deformity. Normal range of motion.     Cervical back: Normal range of motion.  Lymphadenopathy:     Cervical: No cervical adenopathy.  Skin:    General: Skin is warm and dry.     Findings: No erythema or rash.  Neurological:     Mental Status: He is alert and oriented to person, place, and time.  Psychiatric:        Behavior: Behavior normal.        Thought Content: Thought content normal.        Judgment: Judgment normal.     Lab Results  Component Value Date   WBC 8.0 04/14/2023   HGB 14.4 04/14/2023   HCT 43.4 04/14/2023   MCV 86.6 04/14/2023   PLT 237 04/14/2023     Chemistry      Component Value Date/Time   NA 136 04/14/2023 0955   K 3.9 04/14/2023 0955   CL 97 (L) 04/14/2023 0955   CO2 31 04/14/2023 0955   BUN 12 04/14/2023 0955   CREATININE 0.91 04/14/2023 0955   CREATININE 1.05 01/11/2022 0000      Component Value Date/Time   CALCIUM 9.2 04/14/2023 0955   ALKPHOS 49 04/14/2023 0955   AST 14 (L) 04/14/2023 0955   ALT 21 04/14/2023 0955   BILITOT 0.5 04/14/2023 0955      Impression and Plan: Brandon Brandon Phillips is a very nice 57 year old white male.  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  Again, he does not wish to have surgery.  He has seen Dr. Dorris Fetch of thoracic surgery.  He completed radiation/chemotherapy.  He has responded but still, I think, there is residual disease.  He has had radiosurgery.  I will now have him on  immunotherapy.  Again, he really should not be having a problem with work.  Again I think any lung disease might be underlying.  I will write a Brandon Phillips to his job to support him from going back to work.  Again I do not see a problem with him going back to work while on immunotherapy.  Apparently, his Pulmonologist will also write a Brandon Phillips.  I would like to get  a CT angiogram so we can see how his pulmonary embolus is doing.  After fourth treatments of immunotherapy, then we will repeat the PET scan.  We will plan to get him back in 1 month.     [name], MD 9/16/202410:49 AM

## 2023-04-14 NOTE — Patient Instructions (Signed)
Implanted [facility] Guide An implanted port is a device that is placed under the skin. It is usually placed in the chest. The device may vary based on the need. Implanted ports can be used to give IV medicine, to take blood, or to give fluids. You may have an implanted port if: You need IV medicine that would be irritating to the small veins in your hands or arms. You need IV medicines, such as chemotherapy, for a long period of time. You need IV nutrition for a long period of time. You may have fewer limitations when using a port than you would if you used other types of long-term IVs. You will also likely be able to return to normal activities after your incision heals. An implanted port has two main parts: Reservoir. The reservoir is the part where a needle is inserted to give medicines or draw blood. The reservoir is round. After the port is placed, it appears as a small, raised area under your skin. Catheter. The catheter is a small, thin tube that connects the reservoir to a vein. Medicine that is inserted into the reservoir goes into the catheter and then into the vein. How is my port accessed? To access your port: A numbing cream may be placed on the skin over the port site. Your health care provider will put on a mask and sterile gloves. The skin over your port will be cleaned carefully with a germ-killing soap and allowed to dry. Your health care provider will gently pinch the port and insert a needle into it. Your health care provider will check for a blood return to make sure the port is in the vein and is still working (patent). If your port needs to remain accessed to get medicine continuously (constant infusion), your health care provider will place a clear bandage (dressing) over the needle site. The dressing and needle will need to be changed every week, or as told by your health care provider. What is flushing? Flushing helps keep the port working. Follow instructions from your  health care provider about how and when to flush the port. Ports are usually flushed with saline solution or a medicine called heparin. The need for flushing will depend on how the port is used: If the port is only used from time to time to give medicines or draw blood, the port may need to be flushed: Before and after medicines have been given. Before and after blood has been drawn. As part of routine maintenance. Flushing may be recommended every 4-6 weeks. If a constant infusion is running, the port may not need to be flushed. Throw away any syringes in a disposal container that is meant for sharp items (sharps container). You can buy a sharps container from a pharmacy, or you can make one by using an empty hard plastic bottle with a cover. How long will my port stay implanted? The port can stay in for as long as your health care provider thinks it is needed. When it is time for the port to come out, a surgery will be done to remove it. The surgery will be similar to the procedure that was done to put the port in. Follow these instructions at home: Caring for your port and port site Flush your port as told by your health care provider. If you need an infusion over several days, follow instructions from your health care provider about how to take care of your port site. Make sure you: Change your  dressing as told by your health care provider. Wash your hands with soap and water for at least 20 seconds before and after you change your dressing. If soap and water are not available, use alcohol-based hand sanitizer. Place any used dressings or infusion bags into a plastic bag. Throw that bag in the trash. Keep the dressing that covers the needle clean and dry. Do not get it wet. Do not use scissors or sharp objects near the infusion tubing. Keep any external tubes clamped, unless they are being used. Check your port site every day for signs of infection. Check for: Redness, swelling, or  pain. Fluid or blood. Warmth. Pus or a bad smell. Protect the skin around the port site. Avoid wearing bra straps that rub or irritate the site. Protect the skin around your port from seat belts. Place a soft pad over your chest if needed. Bathe or shower as told by your health care provider. The site may get wet as long as you are not actively receiving an infusion. General instructions  Return to your normal activities as told by your health care provider. Ask your health care provider what activities are safe for you. Carry a medical alert card or wear a medical alert bracelet at all times. This will let health care providers know that you have an implanted port in case of an emergency. Where to find more information American Cancer Society: www.cancer.org American Society of Clinical Oncology: www.cancer.net Contact a health care provider if: You have a fever or chills. You have redness, swelling, or pain at the port site. You have fluid or blood coming from your port site. Your incision feels warm to the touch. You have pus or a bad smell coming from the port site. Summary Implanted ports are usually placed in the chest for long-term IV access. Follow instructions from your health care provider about flushing the port and changing bandages (dressings). Take care of the area around your port by avoiding clothing that puts pressure on the area, and by watching for signs of infection. Protect the skin around your port from seat belts. Place a soft pad over your chest if needed. Contact a health care provider if you have a fever or you have redness, swelling, pain, fluid, or a bad smell at the port site. This information is not intended to replace advice given to you by your health care provider. Make sure you discuss any questions you have with your health care provider. Document Revised: 01/16/2021 Document Reviewed: 01/16/2021 Elsevier Patient Education  2024 [employer].

## 2023-04-14 NOTE — Patient Instructions (Signed)
[facility]  Discharge Instructions: Thank you for choosing [facility] to provide your oncology and hematology care.   If you have a lab appointment with the Cancer Center, please go directly to the Cancer Center and check in at the registration area.  Wear comfortable clothing and clothing appropriate for easy access to any Portacath or PICC line.   We strive to give you quality time with your provider. You may need to reschedule your appointment if you arrive late (15 or more minutes).  Arriving late affects you and other patients whose appointments are after yours.  Also, if you miss three or more appointments without notifying the office, you may be dismissed from the clinic at the provider's discretion.      For prescription refill requests, have your pharmacy contact our office and allow 72 hours for refills to be completed.    Today you received the following chemotherapy and/or immunotherapy agents Imfinzi      To help prevent nausea and vomiting after your treatment, we encourage you to take your nausea medication as directed.  BELOW ARE SYMPTOMS THAT SHOULD BE REPORTED IMMEDIATELY: *FEVER GREATER THAN 100.4 F (38 C) OR HIGHER *CHILLS OR SWEATING *NAUSEA AND VOMITING THAT IS NOT CONTROLLED WITH YOUR NAUSEA MEDICATION *UNUSUAL SHORTNESS OF BREATH *UNUSUAL BRUISING OR BLEEDING *URINARY PROBLEMS (pain or burning when urinating, or frequent urination) *BOWEL PROBLEMS (unusual diarrhea, constipation, pain near the anus) TENDERNESS IN MOUTH AND THROAT WITH OR WITHOUT PRESENCE OF ULCERS (sore throat, sores in mouth, or a toothache) UNUSUAL RASH, SWELLING OR PAIN  UNUSUAL VAGINAL DISCHARGE OR ITCHING   Items with * indicate a potential emergency and should be followed up as soon as possible or go to the Emergency Department if any problems should occur.  Please show the CHEMOTHERAPY ALERT CARD or IMMUNOTHERAPY ALERT CARD at check-in  to the Emergency Department and triage nurse. Should you have questions after your visit or need to cancel or reschedule your appointment, please contact [facility]  [phone] and follow the prompts.  Office hours are 8:00 a.m. to 4:30 p.m. Monday - Friday. Please note that voicemails left after 4:00 p.m. may not be returned until the following business day.  We are closed weekends and major holidays. You have access to a nurse at all times for urgent questions. Please call the main number to the clinic [phone] and follow the prompts.  For any non-urgent questions, you may also contact your provider using MyChart. We now offer e-Visits for anyone 23 and older to request care online for non-urgent symptoms. For details visit [email or web address].   Also download the MyChart app! Go to the app store, search "MyChart", open the app, select [facility], and log in with your MyChart username and password.

## 2023-04-15 ENCOUNTER — Ambulatory Visit (INDEPENDENT_AMBULATORY_CARE_PROVIDER_SITE_OTHER): Payer: BC Managed Care – PPO | Admitting: Internal Medicine

## 2023-04-15 ENCOUNTER — Encounter: Payer: Self-pay | Admitting: Internal Medicine

## 2023-04-15 ENCOUNTER — Encounter: Payer: Self-pay | Admitting: *Deleted

## 2023-04-15 VITALS — BP 108/70 | HR 93 | Temp 98.1°F | Ht 65.5 in | Wt 179.0 lb

## 2023-04-15 DIAGNOSIS — J449 Chronic obstructive pulmonary disease, unspecified: Secondary | ICD-10-CM | POA: Diagnosis not present

## 2023-04-15 DIAGNOSIS — R400 Somnolence: Secondary | ICD-10-CM

## 2023-04-15 DIAGNOSIS — R0609 Other forms of dyspnea: Secondary | ICD-10-CM

## 2023-04-15 DIAGNOSIS — J441 Chronic obstructive pulmonary disease with (acute) exacerbation: Secondary | ICD-10-CM | POA: Diagnosis not present

## 2023-04-15 DIAGNOSIS — Z87891 Personal history of nicotine dependence: Secondary | ICD-10-CM | POA: Diagnosis not present

## 2023-04-15 DIAGNOSIS — Z85118 Personal history of other malignant neoplasm of bronchus and lung: Secondary | ICD-10-CM

## 2023-04-15 DIAGNOSIS — Z87898 Personal history of other specified conditions: Secondary | ICD-10-CM

## 2023-04-15 DIAGNOSIS — R5381 Other malaise: Secondary | ICD-10-CM

## 2023-04-15 DIAGNOSIS — Z86711 Personal history of pulmonary embolism: Secondary | ICD-10-CM

## 2023-04-15 LAB — T4: T4, Total: 8.1 ug/dL (ref 4.5–12.0)

## 2023-04-15 NOTE — Progress Notes (Unsigned)
Patient received next cycle of immunotherapy. He will need a CT to assess his PE prior to next MD appointment. Scheduled for 04/23/2023.  Oncology Nurse Navigator Documentation     04/15/2023    3:30 PM  Oncology Nurse Navigator Flowsheets  Navigator Follow Up Date: 04/23/2023  Navigator Follow Up Reason: Scan Review  Navigator Location [facility]  Navigator Encounter Type Appt/Treatment Plan Review  Patient Visit Type MedOnc  Treatment Phase Active Tx  Barriers/Navigation Needs No Barriers At This Time  Interventions None Required  Acuity Level 1-No Barriers  Support Groups/Services Friends and Family  Time Spent with Patient 15

## 2023-04-15 NOTE — Patient Instructions (Addendum)
ICD-10-CM   1. Stage 3 severe COPD by GOLD classification (HCC)  J44.9 Pulse oximetry, overnight    2. COPD, frequent exacerbations (HCC)  J44.1     3. 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 History of snoring  Z87.898     5. Daytime somnolence  R40.0     6. History of pulmonary embolism  Z86.711     7. DOE (dyspnea on exertion)  R06.09     8. Physical deconditioning  R53.81       Stage 3 severe COPD by GOLD classification (HCC) - Plan: Pulse oximetry, overnight COPD, frequent exacerbations (HCC)  -Currently stable without any flareup.  Previous eosinophils normal excluding you from Dupixent therapy for repeated exacerbations  Plan - Continue Breztri 2 puff 2 times daily - You do not need oxygen for rest and also exertion up to 1 flight of stairs -At work you can use oxygen as needed based on your comfort level and oxygen dropping to less than 88% but overall you do not need oxygen while at work  -CMA to do a note to your employer as such -Check blood alpha-1 antitrypsin phenotype  Former heavy cigarette smoker (20-39 per day) History of lung cancer stage IIIb non-small cell  Plan  - Per Dr. Myna Hidalgo  History of snoring Daytime somnolence  Plan - Refer Dr. Vassie Loll or Dr. Val Eagle  -Determinant of night study according to them -Hold off on doing simple pulse oximetry at night  History of pulmonary embolism May 2024 without RV strain  Plan - Continue Eliquis -In May 2025 we can look at getting a D-dimer monitoring test   DOE (dyspnea on exertion) Physical deconditioning  -Symptoms are multifactorial related to COPD, history of lung cancer and chemo and radiation.  Plan - Refer pulmonary rehabilitation  Follow-up -Refer sleep doctor -Return to see Dr. Marchelle Gearing in 10-12 weeks. - 2-3 months with Dr. Marchelle Gearing; video visit is fine

## 2023-04-15 NOTE — Progress Notes (Signed)
10/29/2022: OV with Dr. Thora Lance.  Undergoing radiation for stage IIb NSCLC left lower lobe, neoadjuvant chemotherapy with subsequent plan for restaging.  Resection potentially offered though he is hopeful to avoid surgery.  Does feel like his dyspnea has been worse since last visit.  He has had cough with yellow sputum production over the last 6 days.  He also increased pain with swallowing since starting XRT.  Treated for AE COPD with Z-Pak and prednisone taper.  Switched from Symbicort to [employer].  Advised to call if symptoms did not improve.  11/19/2022: OV with Cobb NP for acute visit with his wife.  He was seen 4/2 and treated for a COPD with Z-Pak and prednisone taper.  He tells me that when he was on the prednisone, he was feeling a little bit better.  His wife was not noticing him wheeze.  Since he has finished these medications, he feels like he is more short of breath, has more chest congestion and wheezing and is still producing yellow phlegm.  Cough tends to be worse at night and then in the morning when he first gets up.  He does have some nasal drainage.  Denies any fevers, night sweats, hemoptysis, lower extremity swelling, orthopnea.  No interim sick exposures.  Using [place] twice daily.  Does not have a neb machine at home. Eating and drinking well.   12/04/2022:  OV with Cobb NP for follow up. We treated him for unresolved acute bronchitis/AECOPD at his last visit with doxycycline course and prednisone taper. He had previously had good response to the steroids but flared back up once off. His CXR did not show an acute process. He was also recommended to start intranasal steroid for postnasal drainage and cough control measures.  Today, he tells me that he is some better but still having some chest congestion and cough. His cough is mostly dry but occasionally producing some white phlegm at times. Does have chest discomfort with the cough at times. His heart rate is up compared to his  last visit. They haven't noticed much wheezing. Breathing feels better but still more short winded than his baseline. He is having some increased voice hoarseness. Denies fevers, chills, hemoptysis, leg swelling, calf pain, palpitations. He is using Clinical cytogeneticist. He's not sure if the Markus Daft is making him cough more and wonders if he should go back to his Symbicort. Doesn't notice an increase cough right after he uses it. He is using flonase but he thinks this makes him drain more. Still using his neb treatments twice a day, which help some. Not using any cough suppressants as he was worried that he shouldn't be suppressing his cough.  D dimer positive - CTA positive for subsegmental PE. Admitted for treatment. No right heart strain  12/19/2022: OV with Dr. Thora Lance. Started on Eliquis for subsegmental RLL PE. Doing ok now. Quite a bit of chest congestion. Using breztri bid. Advised to use mucinex and flutter valve for chest congestion. Upcoming restaging PET/CT. F/u 8 weeks or sooner.  02/13/2023: OV with Cobb NP for intended follow up but he is also struggling with some acute symptoms. He's had increased shortness of breath over the past week or two. He gets winded just walking from the parking lot into the building. He is coughing some more. Producing some white phlegm. He does have some wheezing at times. His wife says that he had some low O2 levels over the weekend, down to 84% with exertion. He  denies any fevers, chills, hemoptysis, leg swelling, calf pain, orthopnea, CP. He has not had any missed doses of Eliquis. No excessive bruising or bleeding. He is using his albuterol twice a day and neb twice a week. Still on Breztri.   02/20/2023: Today - follow up Patient presents today for follow up with his wife. He was treated for AECOPD with z pack and prednisone taper, and started on supplemental oxygen at our last visit. He is feeling some better today. Chest feels clearer. He's unsure if he's notice a huge  difference in his breathing but his activity tolerance is some improved. Cough is mostly resolved. Not noticing as much wheezing. He's still having drops in his oxygen with activity; SpO2 low 86% on room air. He will put his oxygen on when he notices they're low. Not sleeping with it every night. He has not missed any doses of Eliquis. Labs were nl aside from elevated WBC, likely due to steroid use as CXR was clear.  57 year old male, former smoker followed for NSCLC of left lung and COPD. He is a patient of Dr. [name] and last seen in office 02/13/2023 by [name] NP. Past medical history significant for HTN, allergic rhinitis.   TEST/EVENTS:  07/25/2022 LDCT chest: Atherosclerosis.  Spiculated solid central left lower lobe lung mass measuring 35 mm, new.  Solid peripheral right upper lobe pulmonary nodule measuring 7.9 mm. 08/14/2022 PET scan: Low-level hypermetabolism of the right upper lobe pulmonary nodule.  Hypermetabolism of the left lower lobe lung mass and ipsilateral hilar nodal metastasis. 08/26/2022 PFT: FVC 66, FEV1 42, ratio 48, TLC 122, DLCOcor 57.  No BD. 11/19/2022 CXR: stable left lower lobe mass. New right-sided chest wall port.  12/04/2022 CTA chest: small subsegmental RLL PE. LLL perihilar mass, 2.2x1.8 cm. No LAD. No acute consolidation.  01/01/2023 PET: LLL nodule with intense hypermetabolism despite decrease in size. Hypermetabolic left hilar node. Intense metabolic activity in right shoulder AC joint, no CT lesion evident and favors non-neoplastic activity. No distant metastatic disease. Post radiation change in marrow of lower thoracic spine.  02/13/2023 CXR: unchanged left perihilar mass. No acute process  OV 04/15/2023-transfer of care from Dr. Felisa Bonier to Dr. Marchelle Gearing after Dr. Stacie Acres relocated to [facility]  Subjective:  Patient ID: Brandon Phillips., male , DOB: 1966-07-11 , age 57 y.o. , MRN: [id number] , ADDRESS: [address] [place] [address] PCP [name], NP Patient Care Team: [name], NP as PCP - General (Nurse Practitioner) [name], MD as Medical Oncologist (Oncology) Gwendel Hanson, RN as Oncology Nurse Navigator  This Provider for this visit: Treatment Team:  Attending Provider: Kalman Shan, MD    04/15/2023 -   Chief Complaint  Patient presents with   Follow-up    Former pt of Dr. Rise Paganini. Breathing is doing well overall. He still has occ cough- prod with yellow sputum.      HPI Brandon Phillips. 57 y.o. -returns for follow-up.  Presents with his wife is an independent historian.  This is to establish care but also several issues  #Gold stage III COPD: Determinant's pulmonary function test January 2024.  He says he has diagnosis for 2 years.  He was on Symbicort but a few months ago was changed to [employer].  Currently deems COPD to be stable.  He says his dyspnea is worse than compared to 6 months ago but very similar to the last 1 or 2 months.  He is  in need of alpha-1 antitrypsin check.  #Recurrent COPD exacerbations.  In 2022 3 no prednisone usage.  But in 2024 he has been on prednisone at least 3 times.  This is for COPD exacerbation.  Review of the records indicate his blood eosinophils are normal.  It appears at least in the last month or 2 that he has had no COPD exacerbations.  #Hypoxemia: He was prescribed oxygen because of desaturations with exertion but today with 15 sit/stand which is like climbing a flight of stairs he did not desaturate at all.  This is on room air.  His employer is not letting him work because we wrote for him to be on oxygen all the time.  He is at risk of losing the job he is not work no in 2 weeks.  He works at a [employer].  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  And he can easily do this job.  At this point in time I told him he does not need oxygen to do the job unless it subjective needs or he is actually  desaturating which she is not.  #History of small pulmonary embolism May 2024: Continues on Eliquis no further bleeding  #History of lung cancer stage IIIb non-small cell: Has seen Dr. Myna Hidalgo.  Note reviewed from yesterday.  #Possible sleep apnea: Wife gives a history of snoring.  Katie Cobb was supposed to order night oxygen but appartly the test for overnight pulse oximetry was never executed.  Is ordered did not get executed the DME company did not call them.  In any event they said there is a lot of snoring and excessive daytime somnolence.  Therefore referred to sleep doctor  #X of symptoms of shortness of breath and fatigue: This is multifactorial.  His hemoglobin is normal earlier this year echo was normal.  I had discussed pulmonary rehabilitation and they are very interested.  Simple office walk 224 (66+46 x 2) feet Pod A at [facility] x  3 laps goal with forehead probe 04/15/2023 Eco 1   O2 used ra   Number laps completed Sit and stand x 15   Comments about pace good   Resting Pulse Ox/HR 96% and 96/min   Final Pulse Ox/HR 92% and 106/min immedite -> 90% and HR 104   Desaturated </= 88% no   Desaturated <= 3% points yes   Got Tachycardic >/= 90/min yes   Symptoms at end of test Yes dyspneic   Miscellaneous comments x     PFT     Latest Ref Rng & Units 08/26/2022    3:25 PM  PFT Results  FVC-Pre L 2.75   FVC-Predicted Pre % 66   FVC-Post L 2.77   FVC-Predicted Post % 67   Pre FEV1/FVC % % 48   Post FEV1/FCV % % 48   FEV1-Pre L 1.33   FEV1-Predicted Pre % 42   FEV1-Post L 1.33   DLCO uncorrected ml/min/mmHg 15.07   DLCO UNC% % 61   DLCO corrected ml/min/mmHg 14.04   DLCO COR %Predicted % 57   DLVA Predicted % 60   TLC L 7.30   TLC % Predicted % 122   RV % Predicted % 235       LAB RESULTS last 96 hours No results found.  LAB RESULTS last 90 days Recent Results (from the past 2160 hour(s))  CBC with Differential (Cancer Center Only)     Status: None  Collection Time: 01/20/23  9:25 AM  Result Value Ref Range   WBC Count 6.7 4.0 - 10.5 K/uL   RBC 4.56 4.22 - 5.81 MIL/uL   Hemoglobin 14.5 13.0 - 17.0 g/dL   HCT 91.4 78.2 - 95.6 %   MCV 94.3 80.0 - 100.0 fL   MCH 31.8 26.0 - 34.0 pg   MCHC 33.7 30.0 - 36.0 g/dL   RDW 21.3 08.6 - 57.8 %   Platelet Count 199 150 - 400 K/uL   nRBC 0.0 0.0 - 0.2 %   Neutrophils Relative % 76 %   Neutro Abs 5.1 1.7 - 7.7 K/uL   Lymphocytes Relative 11 %   Lymphs Abs 0.7 0.7 - 4.0 K/uL   Monocytes Relative 10 %   Monocytes Absolute 0.7 0.1 - 1.0 K/uL   Eosinophils Relative 2 %   Eosinophils Absolute 0.1 0.0 - 0.5 K/uL   Basophils Relative 1 %   Basophils Absolute 0.1 0.0 - 0.1 K/uL   Immature Granulocytes 0 %   Abs Immature Granulocytes 0.03 0.00 - 0.07 K/uL    Comment: Performed at [facility] Lab at [facility], [address], [place], [place] [address]  CMP (Cancer Center only)     Status: None   Collection Time: 01/20/23  9:25 AM  Result Value Ref Range   Sodium 138 135 - 145 mmol/L   Potassium 3.8 3.5 - 5.1 mmol/L   Chloride 100 98 - 111 mmol/L   CO2 28 22 - 32 mmol/L   Glucose, Bld 99 70 - 99 mg/dL    Comment: Glucose reference range applies only to samples taken after fasting for at least 8 hours.   BUN 12 6 - 20 mg/dL   Creatinine 9.52 8.41 - 1.24 mg/dL   Calcium 9.5 8.9 - 32.4 mg/dL   Total Protein 6.9 6.5 - 8.1 g/dL   Albumin 4.3 3.5 - 5.0 g/dL   AST 18 15 - 41 U/L   ALT 34 0 - 44 U/L   Alkaline Phosphatase 48 38 - 126 U/L   Total Bilirubin 0.4 0.3 - 1.2 mg/dL   GFR, Estimated >40 >10 mL/min    Comment: (NOTE) Calculated using the CKD-EPI Creatinine Equation (2021)    Anion gap 10 5 - 15    Comment: Performed at [facility] Lab at [facility], [address] [place], [place] [address]  Lactate dehydrogenase     Status: None   Collection Time: 01/20/23  9:25 AM  Result Value Ref Range   LDH 161 98 - 192 U/L    Comment:  Performed at [facility] Lab at [facility], [address], [place], [place] [address]  TSH     Status: None   Collection Time: 01/20/23  9:25 AM  Result Value Ref Range   TSH 2.587 0.350 - 4.500 uIU/mL    Comment: Performed by a 3rd Generation assay with a functional sensitivity of <=0.01 uIU/mL. Performed at [facility], [address] [place], [place] [address]   T4     Status: None   Collection Time: 01/20/23  9:25 AM  Result Value Ref Range   T4, Total 6.6 4.5 - 12.0 ug/dL    Comment: (NOTE) Performed At: [facility] [address] [place], [place] [id number] Jolene Schimke MD [id number]   Florencia Reasons Session Summary     Status: None   Collection Time: 02/04/23  4:20 PM  Result Value Ref Range   Course ID [email or web address]    Course Start Date 01/23/2023    Session Number 1    Course First Treatment Date 02/04/2023  4:17 PM    Course Last Treatment Date 02/04/2023  4:18 PM    Course Elapsed Days 0    Reference Point ID Lung_L_SBRT DP    Reference Point Dosage Given to Date 6.5 Gy   Reference Point Session Dosage Given 6.5 Gy   Plan ID Lung_L_SBRT    Plan Fractions Treated to Date 1    Plan Total Fractions Prescribed 3    Plan Prescribed Dose Per Fraction 6.5 Gy   Plan Total Prescribed Dose 19.500000 Gy   Plan Primary Reference Point Lung_L_SBRT DP   Rad Onc Aria Session Summary     Status: None   Collection Time: 02/06/23  3:44 PM  Result Value Ref Range   Course ID [email or web address]    Course Start Date 01/23/2023    Session Number 2    Course First Treatment Date 02/04/2023  4:17 PM    Course Last Treatment Date 02/06/2023  3:43 PM    Course Elapsed Days 2    Reference Point ID Lung_L_SBRT DP    Reference Point Dosage Given to Date 13 Gy   Reference Point Session Dosage Given 6.5 Gy   Plan ID Lung_L_SBRT    Plan Fractions Treated to Date 2    Plan Total Fractions Prescribed 3    Plan Prescribed Dose Per Fraction 6.5 Gy   Plan  Total Prescribed Dose 19.500000 Gy   Plan Primary Reference Point Lung_L_SBRT DP   Rad Onc Aria Session Summary     Status: None   Collection Time: 02/11/23  4:21 PM  Result Value Ref Range   Course ID C2_Chest    Course Start Date 01/23/2023    Session Number 3    Course First Treatment Date 02/04/2023  4:17 PM    Course Last Treatment Date 02/11/2023  4:20 PM    Course Elapsed Days 7    Reference Point ID Lung_L_SBRT DP    Reference Point Dosage Given to Date 19.5 Gy   Reference Point Session Dosage Given 6.5 Gy   Plan ID Lung_L_SBRT    Plan Fractions Treated to Date 3    Plan Total Fractions Prescribed 3    Plan Prescribed Dose Per Fraction 6.5 Gy   Plan Total Prescribed Dose 19.500000 Gy   Plan Primary Reference Point Lung_L_SBRT DP   Specimen status report     Status: None   Collection Time: 02/13/23 12:00 AM  Result Value Ref Range   specimen status report Comment     Comment: Ambiguous Test Order Ambiguous Test Order Attempts to contact your facility to clarify an ambiguous test order were unsuccessful.  Please contact the laboratory to verify the request. Specimens are routinely held for 7 days from date of receipt. Ambiguous test order:   Basic Metabolic Panel (BMET)     Status: None   Collection Time: 02/13/23  4:01 PM  Result Value Ref Range   Sodium 135 135 - 145 mEq/L   Potassium 4.0 3.5 - 5.1 mEq/L   Chloride 98 96 - 112 mEq/L   CO2 27 19 - 32 mEq/L   Glucose, Bld 90 70 - 99 mg/dL   BUN 11 6 - 23 mg/dL   Creatinine, Ser 1.91 0.40 - 1.50 mg/dL   GFR 47.82 >95.62 mL/min    Comment: Calculated using  the CKD-EPI Creatinine Equation (2021)   Calcium 9.3 8.4 - 10.5 mg/dL  CBC w/Diff     Status: Abnormal   Collection Time: 02/13/23  4:01 PM  Result Value Ref Range   WBC 6.4 4.0 - 10.5 K/uL   RBC 4.34 4.22 - 5.81 Mil/uL   Hemoglobin 13.4 13.0 - 17.0 g/dL   HCT 16.1 09.6 - 04.5 %   MCV 93.0 78.0 - 100.0 fl   MCHC 33.2 30.0 - 36.0 g/dL   RDW 40.9 81.1 - 91.4 %    Platelets 286.0 150.0 - 400.0 K/uL   Neutrophils Relative % 74.2 43.0 - 77.0 %   Lymphocytes Relative 7.8 aL (L) 12.0 - 46.0 %   Monocytes Relative 12.1 (H) 3.0 - 12.0 %   Eosinophils Relative 4.8 0.0 - 5.0 %   Basophils Relative 1.1 0.0 - 3.0 %   Neutro Abs 4.7 1.4 - 7.7 K/uL   Lymphs Abs 0.5 (L) 0.7 - 4.0 K/uL   Monocytes Absolute 0.8 0.1 - 1.0 K/uL   Eosinophils Absolute 0.3 0.0 - 0.7 K/uL   Basophils Absolute 0.1 0.0 - 0.1 K/uL  TSH     Status: None   Collection Time: 02/17/23  9:30 AM  Result Value Ref Range   TSH 0.943 0.350 - 4.500 uIU/mL    Comment: Performed by a 3rd Generation assay with a functional sensitivity of <=0.01 uIU/mL. Performed at [facility], [address] [place], [place] [address]   CMP (Cancer Center only)     Status: Abnormal   Collection Time: 02/17/23  9:30 AM  Result Value Ref Range   Sodium 136 135 - 145 mmol/L   Potassium 3.7 3.5 - 5.1 mmol/L   Chloride 100 98 - 111 mmol/L   CO2 26 22 - 32 mmol/L   Glucose, Bld 212 (H) 70 - 99 mg/dL    Comment: Glucose reference range applies only to samples taken after fasting for at least 8 hours.   BUN 17 6 - 20 mg/dL   Creatinine 6.21 3.08 - 1.24 mg/dL   Calcium 9.5 8.9 - 65.7 mg/dL   Total Protein 7.2 6.5 - 8.1 g/dL   Albumin 4.0 3.5 - 5.0 g/dL   AST 12 (L) 15 - 41 U/L   ALT 22 0 - 44 U/L   Alkaline Phosphatase 58 38 - 126 U/L   Total Bilirubin 0.3 0.3 - 1.2 mg/dL   GFR, Estimated >84 >69 mL/min    Comment: (NOTE) Calculated using the CKD-EPI Creatinine Equation (2021)    Anion gap 10 5 - 15    Comment: Performed at [facility] Lab at [facility], [address] [place], [place] [address]  CBC with Differential (Cancer Center Only)     Status: Abnormal   Collection Time: 02/17/23  9:30 AM  Result Value Ref Range   WBC Count 15.3 (H) 4.0 - 10.5 K/uL   RBC 4.67 4.22 - 5.81 MIL/uL   Hemoglobin 14.4 13.0 - 17.0 g/dL   HCT 84.1 32.4 - 40.1 %   MCV 92.1  80.0 - 100.0 fL   MCH 30.8 26.0 - 34.0 pg   MCHC 33.5 30.0 - 36.0 g/dL   RDW 02.7 25.3 - 66.4 %   Platelet Count 342 150 - 400 K/uL   nRBC 0.0 0.0 - 0.2 %   Neutrophils Relative % 89 %   Neutro Abs 13.7 (H) 1.7 - 7.7 K/uL   Lymphocytes Relative 3 %  Lymphs Abs 0.4 (L) 0.7 - 4.0 K/uL   Monocytes Relative 7 %   Monocytes Absolute 1.0 0.1 - 1.0 K/uL   Eosinophils Relative 0 %   Eosinophils Absolute 0.0 0.0 - 0.5 K/uL   Basophils Relative 0 %   Basophils Absolute 0.0 0.0 - 0.1 K/uL   Immature Granulocytes 1 %   Abs Immature Granulocytes 0.15 (H) 0.00 - 0.07 K/uL    Comment: Performed at [facility] Lab at [facility], [address], [place], [place] [address]  Lactate dehydrogenase     Status: None   Collection Time: 02/17/23  9:30 AM  Result Value Ref Range   LDH 148 98 - 192 U/L    Comment: Performed at [facility] Lab at [facility], [address] [place], [place] [address]  BNP (Brain natriuretic peptide)     Status: None   Collection Time: 02/17/23  9:30 AM  Result Value Ref Range   B Natriuretic Peptide 87.1 0.0 - 100.0 pg/mL    Comment: Performed at [facility], [address] [place], [place] [address]  CMP (Cancer Center only)     Status: Abnormal   Collection Time: 03/17/23  9:55 AM  Result Value Ref Range   Sodium 137 135 - 145 mmol/L   Potassium 3.9 3.5 - 5.1 mmol/L   Chloride 98 98 - 111 mmol/L   CO2 31 22 - 32 mmol/L   Glucose, Bld 110 (H) 70 - 99 mg/dL    Comment: Glucose reference range applies only to samples taken after fasting for at least 8 hours.   BUN 12 6 - 20 mg/dL   Creatinine 9.14 7.82 - 1.24 mg/dL   Calcium 9.1 8.9 - 95.6 mg/dL   Total Protein 7.2 6.5 - 8.1 g/dL   Albumin 4.1 3.5 - 5.0 g/dL   AST 15 15 - 41 U/L   ALT 19 0 - 44 U/L   Alkaline Phosphatase 56 38 - 126 U/L   Total Bilirubin 0.3 0.3 - 1.2 mg/dL   GFR, Estimated >21 >30 mL/min    Comment: (NOTE) Calculated using the  CKD-EPI Creatinine Equation (2021)    Anion gap 8 5 - 15    Comment: Performed at [facility] Lab at [facility], [address] [place], [place] [address]  CBC with Differential (Cancer Center Only)     Status: Abnormal   Collection Time: 03/17/23  9:55 AM  Result Value Ref Range   WBC Count 6.5 4.0 - 10.5 K/uL   RBC 4.77 4.22 - 5.81 MIL/uL   Hemoglobin 14.2 13.0 - 17.0 g/dL   HCT 46.9 62.9 - 52.8 %   MCV 89.3 80.0 - 100.0 fL   MCH 29.8 26.0 - 34.0 pg   MCHC 33.3 30.0 - 36.0 g/dL   RDW 41.3 24.4 - 01.0 %   Platelet Count 296 150 - 400 K/uL   nRBC 0.0 0.0 - 0.2 %   Neutrophils Relative % 77 %   Neutro Abs 5.1 1.7 - 7.7 K/uL   Lymphocytes Relative 7 %   Lymphs Abs 0.5 (L) 0.7 - 4.0 K/uL   Monocytes Relative 11 %   Monocytes Absolute 0.7 0.1 - 1.0 K/uL   Eosinophils Relative 3 %   Eosinophils Absolute 0.2 0.0 - 0.5 K/uL   Basophils Relative 1 %   Basophils Absolute 0.0 0.0 - 0.1 K/uL   Immature Granulocytes 1 %  Abs Immature Granulocytes 0.03 0.00 - 0.07 K/uL    Comment: Performed at [facility] Lab at [facility], [address] [place], [place] [address]  TSH     Status: None   Collection Time: 03/17/23  9:55 AM  Result Value Ref Range   TSH 2.079 0.350 - 4.500 uIU/mL    Comment: Performed by a 3rd Generation assay with a functional sensitivity of <=0.01 uIU/mL. Performed at [facility], [address], [place], [place] [address]   Lactate dehydrogenase     Status: None   Collection Time: 03/17/23  9:55 AM  Result Value Ref Range   LDH 155 98 - 192 U/L    Comment: Performed at [facility] Lab at [facility], [address] [place], [place] [address]  CBC with Differential (Cancer Center Only)     Status: Abnormal   Collection Time: 04/14/23  9:55 AM  Result Value Ref Range   WBC Count 8.0 4.0 - 10.5 K/uL   RBC 5.01 4.22 - 5.81 MIL/uL   Hemoglobin 14.4 13.0 - 17.0 g/dL   HCT  91.4 78.2 - 95.6 %   MCV 86.6 80.0 - 100.0 fL   MCH 28.7 26.0 - 34.0 pg   MCHC 33.2 30.0 - 36.0 g/dL   RDW 21.3 08.6 - 57.8 %   Platelet Count 237 150 - 400 K/uL   nRBC 0.0 0.0 - 0.2 %   Neutrophils Relative % 80 %   Neutro Abs 6.4 1.7 - 7.7 K/uL   Lymphocytes Relative 7 %   Lymphs Abs 0.6 (L) 0.7 - 4.0 K/uL   Monocytes Relative 10 %   Monocytes Absolute 0.8 0.1 - 1.0 K/uL   Eosinophils Relative 2 %   Eosinophils Absolute 0.2 0.0 - 0.5 K/uL   Basophils Relative 1 %   Basophils Absolute 0.1 0.0 - 0.1 K/uL   Immature Granulocytes 0 %   Abs Immature Granulocytes 0.03 0.00 - 0.07 K/uL    Comment: Performed at [facility] Lab at [facility], [address], [place], [place] [address]  CMP (Cancer Center only)     Status: Abnormal   Collection Time: 04/14/23  9:55 AM  Result Value Ref Range   Sodium 136 135 - 145 mmol/L   Potassium 3.9 3.5 - 5.1 mmol/L   Chloride 97 (L) 98 - 111 mmol/L   CO2 31 22 - 32 mmol/L   Glucose, Bld 108 (H) 70 - 99 mg/dL    Comment: Glucose reference range applies only to samples taken after fasting for at least 8 hours.   BUN 12 6 - 20 mg/dL   Creatinine 9.52 8.41 - 1.24 mg/dL   Calcium 9.2 8.9 - 32.4 mg/dL   Total Protein 7.1 6.5 - 8.1 g/dL   Albumin 4.4 3.5 - 5.0 g/dL   AST 14 (L) 15 - 41 U/L   ALT 21 0 - 44 U/L   Alkaline Phosphatase 49 38 - 126 U/L   Total Bilirubin 0.5 0.3 - 1.2 mg/dL   GFR, Estimated >40 >10 mL/min    Comment: (NOTE) Calculated using the CKD-EPI Creatinine Equation (2021)    Anion gap 8 5 - 15    Comment: Performed at [facility] Lab at [facility], [address] [place], [place] [address]  TSH     Status: None   Collection Time: 04/14/23  9:55 AM  Result Value  Ref Range   TSH 2.661 0.350 - 4.500 uIU/mL    Comment: Performed by a 3rd Generation assay with a functional sensitivity of <=0.01 uIU/mL. Performed at [facility], [address], [place],  [place] [address]   Lactate dehydrogenase     Status: None   Collection Time: 04/14/23  9:55 AM  Result Value Ref Range   LDH 138 98 - 192 U/L    Comment: Performed at [facility] Lab at [facility], [address], [place], [place] [address]         has a past medical history of Cancer (HCC), CAP (community acquired pneumonia), COPD (chronic obstructive pulmonary disease) (HCC), Eczema, HLD (hyperlipidemia), Primary cancer of left lower lobe of lung (HCC) (09/17/2022), and Tobacco use disorder (01/24/2017).   reports that he quit smoking about 6 years ago. His smoking use included cigarettes. He started smoking about 41 years ago. He has a 52.5 pack-year smoking history. He has never used smokeless tobacco.  Past Surgical History:  Procedure Laterality Date   BRONCHIAL BIOPSY  08/29/2022   Procedure: BRONCHIAL BIOPSIES;  Surgeon: [name], MD;  Location: [facility] ENDOSCOPY;  Service: Pulmonary;;   BRONCHIAL BRUSHINGS  08/29/2022   Procedure: BRONCHIAL BRUSHINGS;  Surgeon: [name], MD;  Location: [facility] ENDOSCOPY;  Service: Pulmonary;;   BRONCHIAL NEEDLE ASPIRATION BIOPSY  08/29/2022   Procedure: BRONCHIAL NEEDLE ASPIRATION BIOPSIES;  Surgeon: [name], MD;  Location: [facility] ENDOSCOPY;  Service: Pulmonary;;   BRONCHIAL WASHINGS  08/29/2022   Procedure: BRONCHIAL WASHINGS;  Surgeon: [name], MD;  Location: [facility] ENDOSCOPY;  Service: Pulmonary;;   FIDUCIAL MARKER PLACEMENT  08/29/2022   Procedure: FIDUCIAL MARKER PLACEMENT;  Surgeon: [name], MD;  Location: [facility] ENDOSCOPY;  Service: Pulmonary;;   HERNIA REPAIR     as an infant   IR IMAGING GUIDED PORT INSERTION  10/03/2022   IR RADIOLOGIST EVAL & MGMT  10/21/2022   VIDEO BRONCHOSCOPY WITH ENDOBRONCHIAL ULTRASOUND  08/29/2022   Procedure: VIDEO BRONCHOSCOPY WITH ENDOBRONCHIAL ULTRASOUND;  Surgeon: [name], MD;  Location: [facility] ENDOSCOPY;  Service: Pulmonary;;    No Known  Allergies  Immunization History  Administered Date(s) Administered   PFIZER(Purple Top)SARS-COV-2 Vaccination 08/03/2020, 08/25/2020   Pneumococcal Polysaccharide-23 01/15/2018   Tdap 01/15/2018   Zoster Recombinant(Shingrix) 07/30/2019, 11/27/2019    No family history on file.   Current Outpatient Medications:    albuterol (PROVENTIL) (2.5 MG/3ML) 0.083% nebulizer solution, Take 3 mLs (2.5 mg total) by nebulization every 6 (six) hours as needed for wheezing or shortness of breath., Disp: 75 mL, Rfl: 5   albuterol (VENTOLIN HFA) 108 (90 Base) MCG/ACT inhaler, Inhale 2 puffs into the lungs every 6 (six) hours as needed for wheezing., Disp: 2 each, Rfl: 11   apixaban (ELIQUIS) 5 MG TABS tablet, Take 1 tablet (5 mg total) by mouth 2 (two) times daily., Disp: 60 tablet, Rfl: 12   atorvastatin (LIPITOR) 20 MG tablet, TAKE ONE TABLET BY MOUTH DAILY, Disp: 90 tablet, Rfl: 3   benzonatate (TESSALON) 200 MG capsule, Take 1 capsule (200 mg total) by mouth 3 (three) times daily as needed for cough., Disp: 30 capsule, Rfl: 1   Budeson-Glycopyrrol-Formoterol (BREZTRI AEROSPHERE) 160-9-4.8 MCG/ACT AERO, Inhale 2 puffs into the lungs in the morning and at bedtime., Disp: 1 each, Rfl: 11   COENZYME Q10 PO, Take 100 mg by mouth in the morning., Disp: , Rfl:    gabapentin (NEURONTIN) 300 MG capsule,  Take 300mg  nightly for 3 nights then increase to 300mg  twice daily. After 1 week, ok to increase to 300mg  three times daily., Disp: 90 capsule, Rfl: 3   GUAIFENESIN 1200 PO, Take 1,200 mg by mouth in the morning and at bedtime., Disp: , Rfl:    lidocaine-prilocaine (EMLA) cream, Apply a dime size to port-a-cath 1-2 hours prior to access. Cover with saran wrap., Disp: 30 g, Rfl: 2   Multiple Vitamin (MULTIVITAMIN) capsule, Take 1 capsule by mouth daily., Disp: , Rfl:    telmisartan (MICARDIS) 40 MG tablet, TAKE 1 TABLET BY MOUTH DAILY, Disp: 30 tablet, Rfl: 4      Objective:   Vitals:   04/15/23 1453  BP:  108/70  Pulse: 93  Temp: 98.1 F (36.7 C)  TempSrc: Oral  SpO2: 97%  Weight: 179 lb (81.2 kg)  Height: 5' 5.5" (1.664 m)    Estimated body mass index is 29.33 kg/m as calculated from the following:   Height as of this encounter: 5' 5.5" (1.664 m).   Weight as of this encounter: 179 lb (81.2 kg).  @WEIGHTCHANGE @  [employer]   04/15/23 1453  Weight: 179 lb (81.2 kg)     Physical Exam   General: No distress.  Looks somewhat deconditioned O2 at rest: no Cane present: no Sitting in wheel chair: no Frail: no Obese: mild Neuro: Alert and Oriented x 3. GCS 15. Speech normal Psych: Pleasant Resp:  Barrel Chest - yes.  Wheeze - n, Crackles - ono, No overt respiratory distress CVS: Normal heart sounds. Murmurs - no Ext: Stigmata of Connective Tissue Disease - no HEENT: Normal upper airway. PEERL +. No post nasal drip        Assessment:       ICD-10-CM   1. Stage 3 severe COPD by GOLD classification (HCC)  J44.9 Alpha-1 antitrypsin phenotype    CANCELED: Pulse oximetry, overnight    2. COPD, frequent exacerbations (HCC)  J44.1 Alpha-1 antitrypsin phenotype    3. 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 History of lung cancer  Z85.118     5. History of snoring  Z87.898     6. Daytime somnolence  R40.0     7. History of pulmonary embolism  Z86.711     8. DOE (dyspnea on exertion)  R06.09 Alpha-1 antitrypsin phenotype    9. Physical deconditioning  R53.81          Plan:     Patient Instructions     ICD-10-CM   1. Stage 3 severe COPD by GOLD classification (HCC)  J44.9 Pulse oximetry, overnight    2. COPD, frequent exacerbations (HCC)  J44.1     3. 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 History of snoring  Z87.898     5. Daytime somnolence  R40.0     6. History of pulmonary embolism  Z86.711     7. DOE (dyspnea on exertion)  R06.09     8. Physical deconditioning  R53.81       Stage 3 severe COPD  by GOLD classification (HCC) - Plan: Pulse oximetry, overnight COPD, frequent exacerbations (HCC)  -Currently stable without any flareup.  Previous eosinophils normal excluding you from Dupixent therapy for repeated exacerbations  Plan - Continue Breztri 2 puff 2 times daily - You do not need oxygen for rest and also exertion up to 1 flight of stairs -At work you can use  oxygen as needed based on your comfort level and oxygen dropping to less than 88% but overall you do not need oxygen while at work  -CMA to do a note to your employer as such -Check blood alpha-1 antitrypsin phenotype  Former heavy cigarette smoker (20-39 per day) History of lung cancer stage IIIb non-small cell  Plan  - Per Dr. Myna Hidalgo  History of snoring Daytime somnolence  Plan - Refer Dr. Vassie Loll or Dr. Val Eagle  -Determinant of night study according to them -Hold off on doing simple pulse oximetry at night  History of pulmonary embolism May 2024 without RV strain  Plan - Continue Eliquis -In May 2025 we can look at getting a D-dimer monitoring test   DOE (dyspnea on exertion) Physical deconditioning  -Symptoms are multifactorial related to COPD, history of lung cancer and chemo and radiation.  Plan - Refer pulmonary rehabilitation  Follow-up -Refer sleep doctor -Return to see Dr. Marchelle Gearing in 10-12 weeks. - 2-3 months with Dr. Marchelle Gearing; video visit is fine   FOLLOWUP Return in about 12 weeks (around 07/08/2023) for 15 min visit, with Dr Marchelle Gearing, Face to Face OR Video Visit.  ( Level 05 visit E&M 2024: Estb >= 40 min    in  visit type: on-site physical face to visit  in total care time and counseling or/and coordination of care by this undersigned MD - Dr Kalman Shan. This includes one or more of the following on this same day 04/15/2023: pre-charting, chart review, note writing, documentation discussion of test results, diagnostic or treatment recommendations, prognosis, risks and benefits of  management options, instructions, education, compliance or risk-factor reduction. It excludes time spent by the CMA or office staff in the care of the patient. Actual time 40 min)   SIGNATURE    Dr. Kalman Shan, M.D., F.C.C.P,  Pulmonary and Critical Care Medicine Staff Physician, [facility] Center Director - Interstitial Lung Disease  Program  Pulmonary Fibrosis [facility] [place], [place], [address]  Pager: [phone], If no answer or between  15:00h - 7:00h: call [phone] Telephone: [phone]  3:33 PM 04/15/2023

## 2023-04-16 ENCOUNTER — Ambulatory Visit (INDEPENDENT_AMBULATORY_CARE_PROVIDER_SITE_OTHER): Payer: BC Managed Care – PPO | Admitting: Family Medicine

## 2023-04-16 ENCOUNTER — Ambulatory Visit (INDEPENDENT_AMBULATORY_CARE_PROVIDER_SITE_OTHER): Payer: BC Managed Care – PPO

## 2023-04-16 ENCOUNTER — Encounter: Payer: Self-pay | Admitting: Family Medicine

## 2023-04-16 ENCOUNTER — Other Ambulatory Visit: Payer: Self-pay

## 2023-04-16 VITALS — BP 109/72 | HR 96 | Ht 65.5 in | Wt 178.8 lb

## 2023-04-16 DIAGNOSIS — R1032 Left lower quadrant pain: Secondary | ICD-10-CM | POA: Diagnosis not present

## 2023-04-16 DIAGNOSIS — K4090 Unilateral inguinal hernia, without obstruction or gangrene, not specified as recurrent: Secondary | ICD-10-CM | POA: Diagnosis not present

## 2023-04-16 MED ORDER — IOHEXOL 9 MG/ML PO SOLN: 500.0000 mL | ORAL | Status: AC

## 2023-04-16 MED ORDER — IOHEXOL 300 MG/ML  SOLN
100.0000 mL | Freq: Once | INTRAMUSCULAR | Status: AC | PRN
  Administered 2023-04-16: 100 mL via INTRAVENOUS

## 2023-04-16 NOTE — Assessment & Plan Note (Signed)
LLQ pain is concerning for diverticulitis.  Check CMP and CBC.  Stat CT abdomen/pelvis ordered.

## 2023-04-16 NOTE — Progress Notes (Signed)
Brandon Phillips. - 57 y.o. male MRN [id number]  Date of birth: 1965/11/11  Subjective Chief Complaint  Patient presents with   Abdominal Pain    After Immunotherapy on x 2 days ago - sharpe left sided abdominal pain that is worse with movement and tender when pressed.     HPI Brandon Phillips. Is a 57 y.o. male here today with complaint of LLQ abdominal pain.  Symptoms started a few days ago.  Describes pain as sharp at times, a little more dull now.  His bowel movements were a little loose initially, now feels like he is constipated but is still having bowel movement.  He has not had fever/chills.  No bloody or dark stools.  He is being treated for lung cancer and notes that his symptoms seem to worsen after immunotherapy with Imfinzi.  He has not discussed this with his Oncologist.    ROS:  A comprehensive ROS was completed and negative except as noted per HPI  No Known Allergies  Past Medical History:  Diagnosis Date   Cancer (HCC)    lung cancer   CAP (community acquired pneumonia)    COPD (chronic obstructive pulmonary disease) (HCC)    Eczema    HLD (hyperlipidemia)    Primary cancer of left lower lobe of lung (HCC) 09/17/2022   Tobacco use disorder 01/24/2017    Past Surgical History:  Procedure Laterality Date   BRONCHIAL BIOPSY  08/29/2022   Procedure: BRONCHIAL BIOPSIES;  Surgeon: [name], MD;  Location: [facility] ENDOSCOPY;  Service: Pulmonary;;   BRONCHIAL BRUSHINGS  08/29/2022   Procedure: BRONCHIAL BRUSHINGS;  Surgeon: [name], MD;  Location: [facility] ENDOSCOPY;  Service: Pulmonary;;   BRONCHIAL NEEDLE ASPIRATION BIOPSY  08/29/2022   Procedure: BRONCHIAL NEEDLE ASPIRATION BIOPSIES;  Surgeon: [name], MD;  Location: [facility] ENDOSCOPY;  Service: Pulmonary;;   BRONCHIAL WASHINGS  08/29/2022   Procedure: BRONCHIAL WASHINGS;  Surgeon: [name], MD;  Location: [facility] ENDOSCOPY;  Service: Pulmonary;;   FIDUCIAL MARKER PLACEMENT  08/29/2022    Procedure: FIDUCIAL MARKER PLACEMENT;  Surgeon: [name], MD;  Location: [facility] ENDOSCOPY;  Service: Pulmonary;;   HERNIA REPAIR     as an infant   IR IMAGING GUIDED PORT INSERTION  10/03/2022   IR RADIOLOGIST EVAL & MGMT  10/21/2022   VIDEO BRONCHOSCOPY WITH ENDOBRONCHIAL ULTRASOUND  08/29/2022   Procedure: VIDEO BRONCHOSCOPY WITH ENDOBRONCHIAL ULTRASOUND;  Surgeon: [name], MD;  Location: [facility] ENDOSCOPY;  Service: Pulmonary;;    Social History   Socioeconomic History   Marital status: Married    Spouse name: Not on file   Number of children: Not on file   Years of education: Not on file   Highest education level: Associate degree: occupational, Scientist, product/process development, or vocational program  Occupational History   Not on file  Tobacco Use   Smoking status: Former    Current packs/day: 0.00    Average packs/day: 1.5 packs/day for 35.0 years (52.5 ttl pk-yrs)    Types: Cigarettes    Start date: 03/30/1982    Quit date: 03/30/2017    Years since quitting: 6.0   Smokeless tobacco: Never  Vaping Use   Vaping status: Former   Quit date: 08/23/2022   Substances: Nicotine  Substance and Sexual Activity   Alcohol use: No   Drug use: No   Sexual activity: Yes  Other Topics Concern   Not on file  Social History Narrative   Not on file  Social Determinants of Health   Financial Resource Strain: Low Risk  (01/06/2023)   Overall Financial Resource Strain (CARDIA)    Difficulty of Paying Living Expenses: Not very hard  Food Insecurity: No Food Insecurity (01/22/2023)   Hunger Vital Sign    Worried About Running Out of Food in the Last Year: Never true    Ran Out of Food in the Last Year: Never true  Transportation Needs: No Transportation Needs (01/22/2023)   PRAPARE - Administrator, Civil Service (Medical): No    Lack of Transportation (Non-Medical): No  Physical Activity: Insufficiently Active (01/06/2023)   Exercise Vital Sign    Days of Exercise per Week: 3 days     Minutes of Exercise per Session: 20 min  Stress: No Stress Concern Present (01/06/2023)   [employer] of Occupational Health - Occupational Stress Questionnaire    Feeling of Stress : Only a little  Social Connections: Socially Isolated (01/06/2023)   Social Connection and Isolation Panel [NHANES]    Frequency of Communication with Friends and Family: Once a week    Frequency of Social Gatherings with Friends and Family: Once a week    Attends Religious Services: Never    Database administrator or Organizations: No    Attends Engineer, structural: Not on file    Marital Status: Married    History reviewed. No pertinent family history.  Health Maintenance  Topic Date Due   INFLUENZA VACCINE  10/27/2023 (Originally 02/27/2023)   Fecal DNA (Cologuard)  09/12/2023   DTaP/Tdap/Td (2 - Td or Tdap) 01/16/2028   Hepatitis C Screening  Completed   HIV Screening  Completed   Zoster Vaccines- Shingrix  Completed   HPV VACCINES  Aged Out   Lung Cancer Screening  Discontinued   COVID-19 Vaccine  Discontinued     ----------------------------------------------------------------------------------------------------------------------------------------------------------------------------------------------------------------- Physical Exam BP 109/72   Pulse 96   Ht 5' 5.5" (1.664 m)   Wt 178 lb 12 oz (81.1 kg)   SpO2 93%   BMI 29.29 kg/m   Physical Exam Constitutional:      Appearance: Normal appearance.  Cardiovascular:     Rate and Rhythm: Normal rate and regular rhythm.  Pulmonary:     Effort: Pulmonary effort is normal.     Breath sounds: Normal breath sounds.  Abdominal:     General: There is distension (slight distention).     Palpations: Abdomen is soft. There is no mass.     Tenderness: There is abdominal tenderness (TTP LLQ). There is no guarding.  Neurological:     Mental Status: He is alert.  Psychiatric:        Mood and Affect: Mood normal.        Behavior:  Behavior normal.     ------------------------------------------------------------------------------------------------------------------------------------------------------------------------------------------------------------------- Assessment and Plan  Left lower quadrant abdominal pain LLQ pain is concerning for diverticulitis.  Check CMP and CBC.  Stat CT abdomen/pelvis ordered.   No orders of the defined types were placed in this encounter.   No follow-ups on file.    This visit occurred during the SARS-CoV-2 public health emergency.  Safety protocols were in place, including screening questions prior to the visit, additional usage of staff PPE, and extensive cleaning of exam room while observing appropriate contact time as indicated for disinfecting solutions.

## 2023-04-17 ENCOUNTER — Encounter: Payer: Self-pay | Admitting: Hematology & Oncology

## 2023-04-17 LAB — CBC WITH DIFFERENTIAL/PLATELET
Basophils Absolute: 0.1 10*3/uL (ref 0.0–0.2)
Basos: 1 %
EOS (ABSOLUTE): 0.2 10*3/uL (ref 0.0–0.4)
Eos: 2 %
Hematocrit: 46.7 % (ref 37.5–51.0)
Hemoglobin: 15.4 g/dL (ref 13.0–17.7)
Immature Grans (Abs): 0 10*3/uL (ref 0.0–0.1)
Immature Granulocytes: 0 %
Lymphocytes Absolute: 0.7 10*3/uL (ref 0.7–3.1)
Lymphs: 8 %
MCH: 29.3 pg (ref 26.6–33.0)
MCHC: 33 g/dL (ref 31.5–35.7)
MCV: 89 fL (ref 79–97)
Monocytes Absolute: 0.8 10*3/uL (ref 0.1–0.9)
Monocytes: 9 %
Neutrophils Absolute: 6.9 10*3/uL (ref 1.4–7.0)
Neutrophils: 80 %
Platelets: 270 10*3/uL (ref 150–450)
RBC: 5.26 x10E6/uL (ref 4.14–5.80)
RDW: 13 % (ref 11.6–15.4)
WBC: 8.6 10*3/uL (ref 3.4–10.8)

## 2023-04-17 LAB — CMP14+EGFR
ALT: 25 IU/L (ref 0–44)
AST: 18 IU/L (ref 0–40)
Albumin: 4.3 g/dL (ref 3.8–4.9)
Alkaline Phosphatase: 69 IU/L (ref 44–121)
BUN/Creatinine Ratio: 11 (ref 9–20)
BUN: 11 mg/dL (ref 6–24)
Bilirubin Total: 0.4 mg/dL (ref 0.0–1.2)
CO2: 27 mmol/L (ref 20–29)
Calcium: 9.4 mg/dL (ref 8.7–10.2)
Chloride: 96 mmol/L (ref 96–106)
Creatinine, Ser: 0.97 mg/dL (ref 0.76–1.27)
Globulin, Total: 2.7 g/dL (ref 1.5–4.5)
Glucose: 88 mg/dL (ref 70–99)
Potassium: 4.2 mmol/L (ref 3.5–5.2)
Sodium: 138 mmol/L (ref 134–144)
Total Protein: 7 g/dL (ref 6.0–8.5)
eGFR: 92 mL/min/{1.73_m2} (ref >59)

## 2023-04-18 ENCOUNTER — Telehealth (HOSPITAL_COMMUNITY): Payer: Self-pay

## 2023-04-18 NOTE — Telephone Encounter (Signed)
Called patient to see if he was interested in participating in the Pulmonary Rehab Program. Patient stated yes. Patient will come in for orientation on 04/21/23 @ 1PM and will attend the 1:15PM exercise class.

## 2023-04-19 ENCOUNTER — Other Ambulatory Visit: Payer: Self-pay

## 2023-04-20 DIAGNOSIS — R490 Dysphonia: Secondary | ICD-10-CM | POA: Diagnosis not present

## 2023-04-20 DIAGNOSIS — E785 Hyperlipidemia, unspecified: Secondary | ICD-10-CM | POA: Diagnosis not present

## 2023-04-20 DIAGNOSIS — J449 Chronic obstructive pulmonary disease, unspecified: Secondary | ICD-10-CM | POA: Diagnosis not present

## 2023-04-20 LAB — ALPHA-1 ANTITRYPSIN PHENOTYPE: A-1 Antitrypsin, Ser: 183 mg/dL (ref 83–199)

## 2023-04-21 ENCOUNTER — Encounter (HOSPITAL_COMMUNITY)
Admission: RE | Admit: 2023-04-21 | Discharge: 2023-04-21 | Disposition: A | Discharge status: Home / Self Care | Payer: BC Managed Care – PPO | Source: Ambulatory Visit | Attending: Internal Medicine | Admitting: Internal Medicine

## 2023-04-21 ENCOUNTER — Ambulatory Visit
Admission: RE | Admit: 2023-04-21 | Discharge: 2023-04-21 | Disposition: A | Discharge status: Home / Self Care | Payer: BC Managed Care – PPO | Source: Ambulatory Visit | Attending: Radiation Oncology | Admitting: Radiation Oncology

## 2023-04-21 ENCOUNTER — Encounter (HOSPITAL_COMMUNITY): Payer: Self-pay

## 2023-04-21 VITALS — BP 122/76 | HR 86 | Ht 66.0 in | Wt 179.0 lb

## 2023-04-21 DIAGNOSIS — J449 Chronic obstructive pulmonary disease, unspecified: Secondary | ICD-10-CM | POA: Insufficient documentation

## 2023-04-21 NOTE — Progress Notes (Signed)
Radiation Oncology         [phone] ________________________________  Name: Brandon Phillips. MRN: [id number]  Date of Service: 04/21/2023  DOB: Jan 26, 1966  Post Treatment Telephone Note  Diagnosis:  Stage III cT3N1M0, NSCLC, adenocarcinoma of the LLL (as documented in provider EOT note)   The patient was available for call today.   Symptoms of fatigue have improved since completing therapy.  Symptoms of skin changes have improved since completing therapy.  Symptoms of esophagitis have improved since completing therapy.   The patient has scheduled follow up with his medical oncologist Dr. Myna Hidalgo for ongoing care, and was encouraged to call if he develops concerns or questions regarding radiation.   This concludes the interaction.  Ruel Favors, LPN

## 2023-04-21 NOTE — Progress Notes (Signed)
Brandon Phillips. 57 y.o. male Pulmonary Rehab Orientation Note This patient who was referred to Pulmonary Rehab by Dr. Marchelle Gearing with the diagnosis of COPD 3 arrived today in Cardiac and Pulmonary Rehab. He arrived ambulatory with normal gait. He does carry portable oxygen. [employer] is the provider for their DME. Per patient, [name] uses oxygen intermittently, at night when going to sleep. Color good, skin warm and dry. Patient is oriented to time and place. Patient's medical history, psychosocial health, and medications reviewed. Psychosocial assessment reveals patient lives with spouse. 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 Patient hobbies include  playing with his 5 golden retrievers . Patient reports his stress level is low. Areas of stress/anxiety include health and finances. Patient does not exhibit signs of depression. PHQ2/9 score 0/1. [name] shows good  coping skills with positive outlook on life. Offered emotional support and reassurance. Will continue to monitor and evaluate progress toward psychosocial goal(s) of managing stress. Physical assessment performed by Essie Hart RN. Please see their orientation physical assessment note. [name] reports he  does take medications as prescribed. Patient states he  follows a regular  diet. The patient reports no specific efforts to gain or lose weight. But he would like to lose weight. Patient's weight will be monitored closely. Demonstration and practice of PLB using pulse oximeter. Karman able to return demonstration satisfactorily. Safety and hand hygiene in the exercise area reviewed with patient. [name] voices understanding of the information reviewed. Department expectations discussed with patient and achievable goals were set. The patient shows enthusiasm about attending the program and we look forward to working with [name]. Masiah completed a 6 min walk test today and is scheduled to begin exercise on 04/29/23 at 1:15.    1250-1430 [name], BS, ACSM-CEP

## 2023-04-21 NOTE — Progress Notes (Signed)
Brandon Phillips. 57 y.o. male  Initial Psychosocial Assessment  Pt psychosocial assessment reveals pt lives with their spouse. Pt is currently full time job doing Educational psychologist. Pt hobbies include  playing with his 5 golden retrievers . Pt reports his  stress level is low. Areas of stress/anxiety include health and finances.  Pt does not exhibit signs of depression.  Pt shows good  coping skills with positive outlook . Offered emotional support and reassurance. Monitor and evaluate progress toward psychosocial goal(s).  Goal(s): Improved management of stress Improved coping skills Help patient work toward returning to meaningful activities that improve patient's QOL and are attainable with patient's lung disease   04/21/2023 2:46 PM [name] BS, ACSM-CEP 04/21/2023 2:51 PM

## 2023-04-21 NOTE — Progress Notes (Signed)
Pulmonary Individual Treatment Plan  Patient Details  Name: Brandon Phillips. MRN: [id number] Date of Birth: 04/04/1966 Referring Provider:   Doristine Devoid Pulmonary Rehab Walk Test from 04/21/2023 in [facility]  Referring Provider Ramaswamy       Initial Encounter Date:  Flowsheet Row Pulmonary Rehab Walk Test from 04/21/2023 in [facility]  Date 04/21/23       Visit Diagnosis: Stage 3 severe COPD by GOLD classification (HCC)  Patient's Home Medications on Admission:   Current Outpatient Medications:    albuterol (PROVENTIL) (2.5 MG/3ML) 0.083% nebulizer solution, Take 3 mLs (2.5 mg total) by nebulization every 6 (six) hours as needed for wheezing or shortness of breath., Disp: 75 mL, Rfl: 5   albuterol (VENTOLIN HFA) 108 (90 Base) MCG/ACT inhaler, Inhale 2 puffs into the lungs every 6 (six) hours as needed for wheezing., Disp: 2 each, Rfl: 11   apixaban (ELIQUIS) 5 MG TABS tablet, Take 1 tablet (5 mg total) by mouth 2 (two) times daily., Disp: 60 tablet, Rfl: 12   atorvastatin (LIPITOR) 20 MG tablet, TAKE ONE TABLET BY MOUTH DAILY, Disp: 90 tablet, Rfl: 3   benzonatate (TESSALON) 200 MG capsule, Take 1 capsule (200 mg total) by mouth 3 (three) times daily as needed for cough., Disp: 30 capsule, Rfl: 1   Budeson-Glycopyrrol-Formoterol (BREZTRI AEROSPHERE) 160-9-4.8 MCG/ACT AERO, Inhale 2 puffs into the lungs in the morning and at bedtime., Disp: 1 each, Rfl: 11   COENZYME Q10 PO, Take 100 mg by mouth in the morning., Disp: , Rfl:    gabapentin (NEURONTIN) 300 MG capsule, Take 300mg  nightly for 3 nights then increase to 300mg  twice daily. After 1 week, ok to increase to 300mg  three times daily., Disp: 90 capsule, Rfl: 3   GUAIFENESIN 1200 PO, Take 1,200 mg by mouth in the morning and at bedtime., Disp: , Rfl:    lidocaine-prilocaine (EMLA) cream, Apply a dime size  to port-a-cath 1-2 hours prior to access. Cover with saran wrap., Disp: 30 g, Rfl: 2   Multiple Vitamin (MULTIVITAMIN) capsule, Take 1 capsule by mouth daily., Disp: , Rfl:    telmisartan (MICARDIS) 40 MG tablet, TAKE 1 TABLET BY MOUTH DAILY, Disp: 30 tablet, Rfl: 4  Past Medical History: Past Medical History:  Diagnosis Date   Cancer (HCC)    lung cancer   CAP (community acquired pneumonia)    COPD (chronic obstructive pulmonary disease) (HCC)    Eczema    HLD (hyperlipidemia)    Primary cancer of left lower lobe of lung (HCC) 09/17/2022   Tobacco use disorder 01/24/2017    Tobacco Use: Social History   Tobacco Use  Smoking Status Former   Current packs/day: 0.00   Average packs/day: 1.5 packs/day for 35.0 years (52.5 ttl pk-yrs)   Types: Cigarettes   Start date: 03/30/1982   Quit date: 03/30/2017   Years since quitting: 6.0  Smokeless Tobacco Never    Labs: Review Flowsheet       Latest Ref Rng & Units 02/10/2020 04/06/2020 01/11/2022  Labs for ITP Cardiac and Pulmonary Rehab  Cholestrol <200 mg/dL 308  657  846   LDL (calc) mg/dL (calc) 962  92  88   HDL-C > OR = 40 mg/dL 50  38  47   Trlycerides <150 mg/dL 952  841  90     Details  Capillary Blood Glucose: Lab Results  Component Value Date   GLUCAP 95 01/01/2023   GLUCAP 113 (H) 08/14/2022     Pulmonary Assessment Scores:  Pulmonary Assessment Scores     Row Name 04/21/23 1328         ADL UCSD   ADL Phase Entry     SOB Score total 40       CAT Score   CAT Score 17       mMRC Score   mMRC Score 2             UCSD: Self-administered rating of dyspnea associated with activities of daily living (ADLs) 6-point scale (0 = "not at all" to 5 = "maximal or unable to do because of breathlessness")  Scoring Scores range from 0 to 120.  Minimally important difference is 5 units  CAT: CAT can identify the health impairment of COPD patients and is better correlated with disease progression.   CAT has a scoring range of zero to 40. The CAT score is classified into four groups of low (less than 10), medium (10 - 20), high (21-30) and very high (31-40) based on the impact level of disease on health status. A CAT score over 10 suggests significant symptoms.  A worsening CAT score could be explained by an exacerbation, poor medication adherence, poor inhaler technique, or progression of COPD or comorbid conditions.  CAT MCID is 2 points  mMRC: mMRC (Modified Medical Research Council) Dyspnea Scale is used to assess the degree of baseline functional disability in patients of respiratory disease due to dyspnea. No minimal important difference is established. A decrease in score of 1 point or greater is considered a positive change.   Pulmonary Function Assessment:  Pulmonary Function Assessment - 04/21/23 1338       Breath   Shortness of Breath No             Exercise Target Goals: Exercise Program Goal: Individual exercise prescription set using results from initial 6 min walk test and THRR while considering  patient's activity barriers and safety.   Exercise Prescription Goal: Initial exercise prescription builds to 30-45 minutes a day of aerobic activity, 2-3 days per week.  Home exercise guidelines will be given to patient during program as part of exercise prescription that the participant will acknowledge.  Activity Barriers & Risk Stratification:  Activity Barriers & Cardiac Risk Stratification - 04/21/23 1338       Activity Barriers & Cardiac Risk Stratification   Activity Barriers Shortness of Breath;Muscular Weakness;Deconditioning    Cardiac Risk Stratification Moderate             6 Minute Walk:  6 Minute Walk     Row Name 04/21/23 1432         6 Minute Walk   Phase Initial     Distance 1060 feet     Walk Time 6 minutes     # of Rest Breaks 0     MPH 2.01     METS 3.03     RPE 11     Perceived Dyspnea  1     VO2 Peak 10.59     Symptoms No      Resting HR 86 bpm     Resting BP 122/76     Resting Oxygen Saturation  90 %     Exercise Oxygen Saturation  during 6 min walk 86 %     Max Ex. HR 99 bpm  Max Ex. BP 108/60     2 Minute Post BP 106/70       Interval HR   1 Minute HR 94     2 Minute HR 96     3 Minute HR 96     4 Minute HR 97     5 Minute HR 96     6 Minute HR 99     2 Minute Post HR 97     Interval Heart Rate? Yes       Interval Oxygen   Interval Oxygen? Yes     Baseline Oxygen Saturation % 90 %     1 Minute Oxygen Saturation % 90 %     1 Minute Liters of Oxygen 0 L     2 Minute Oxygen Saturation % 87 %  lowest 86 RA, applied 1L     2 Minute Liters of Oxygen 0 L     3 Minute Oxygen Saturation % 88 %     3 Minute Liters of Oxygen 1 L     4 Minute Oxygen Saturation % 89 %     4 Minute Liters of Oxygen 1 L     5 Minute Oxygen Saturation % 89 %     5 Minute Liters of Oxygen 1 L     6 Minute Oxygen Saturation % 90 %     6 Minute Liters of Oxygen 1 L     2 Minute Post Oxygen Saturation % 96 %     2 Minute Post Liters of Oxygen 1 L              Oxygen Initial Assessment:  Oxygen Initial Assessment - 04/21/23 1329       Home Oxygen   Home Oxygen Device Home Concentrator;Portable Concentrator    Sleep Oxygen Prescription Continuous    Liters per minute 2.5    Home Exercise Oxygen Prescription Pulsed    Liters per minute 3    Home Resting Oxygen Prescription None    Compliance with Home Oxygen Use Yes      Initial 6 min Walk   Oxygen Used Continuous    Liters per minute 1      Program Oxygen Prescription   Program Oxygen Prescription Continuous    Liters per minute 1      Intervention   Short Term Goals To learn and exhibit compliance with exercise, home and travel O2 prescription;To learn and understand importance of maintaining oxygen saturations>88%;To learn and demonstrate proper use of respiratory medications;To learn and understand importance of monitoring SPO2 with pulse oximeter and  demonstrate accurate use of the pulse oximeter.;To learn and demonstrate proper pursed lip breathing techniques or other breathing techniques. ;Other    Long  Term Goals Other;Demonstrates proper use of MDI's;Compliance with respiratory medication;Exhibits proper breathing techniques, such as pursed lip breathing or other method taught during program session;Maintenance of O2 saturations>88%;Verbalizes importance of monitoring SPO2 with pulse oximeter and return demonstration;Exhibits compliance with exercise, home  and travel O2 prescription             Oxygen Re-Evaluation:   Oxygen Discharge (Final Oxygen Re-Evaluation):   Initial Exercise Prescription:  Initial Exercise Prescription - 04/21/23 1400       Date of Initial Exercise RX and Referring Provider   Date 04/21/23    Referring Provider Ramaswamy    Expected Discharge Date 04/21/23      Oxygen   Oxygen Continuous    Liters 1  Maintain Oxygen Saturation 88% or higher      Treadmill   MPH 2    Grade 0    Minutes 15    METs 3      Recumbant Elliptical   Level 3    RPM 70    Minutes 15    METs 3      Prescription Details   Frequency (times per week) 2    Duration Progress to 30 minutes of continuous aerobic without signs/symptoms of physical distress      Intensity   THRR 40-80% of Max Heartrate 66-132    Ratings of Perceived Exertion 11-13    Perceived Dyspnea 0-4      Progression   Progression Continue to progress workloads to maintain intensity without signs/symptoms of physical distress.      Resistance Training   Training Prescription Yes    Weight blue bands    Reps 10-15             Perform Capillary Blood Glucose checks as needed.  Exercise Prescription Changes:   Exercise Comments:   Exercise Goals and Review:   Exercise Goals     Row Name 04/21/23 1339             Exercise Goals   Increase Physical Activity Yes       Intervention Provide advice, education, support  and counseling about physical activity/exercise needs.;Develop an individualized exercise prescription for aerobic and resistive training based on initial evaluation findings, risk stratification, comorbidities and participant's personal goals.       Expected Outcomes Short Term: Attend rehab on a regular basis to increase amount of physical activity.;Long Term: Exercising regularly at least 3-5 days a week.;Long Term: Add in home exercise to make exercise part of routine and to increase amount of physical activity.       Increase Strength and Stamina Yes       Intervention Provide advice, education, support and counseling about physical activity/exercise needs.;Develop an individualized exercise prescription for aerobic and resistive training based on initial evaluation findings, risk stratification, comorbidities and participant's personal goals.       Expected Outcomes Short Term: Increase workloads from initial exercise prescription for resistance, speed, and METs.;Short Term: Perform resistance training exercises routinely during rehab and add in resistance training at home;Long Term: Improve cardiorespiratory fitness, muscular endurance and strength as measured by increased METs and functional capacity ( )       Able to understand and use rate of perceived exertion (RPE) scale Yes       Intervention Provide education and explanation on how to use RPE scale       Expected Outcomes Short Term: Able to use RPE daily in rehab to express subjective intensity level;Long Term:  Able to use RPE to guide intensity level when exercising independently       Able to understand and use Dyspnea scale Yes       Intervention Provide education and explanation on how to use Dyspnea scale       Expected Outcomes Short Term: Able to use Dyspnea scale daily in rehab to express subjective sense of shortness of breath during exertion;Long Term: Able to use Dyspnea scale to guide intensity level when exercising  independently       Knowledge and understanding of Target Heart Rate Range (THRR) Yes       Intervention Provide education and explanation of THRR including how the numbers were predicted and where they are located for reference  Expected Outcomes Short Term: Able to state/look up THRR;Long Term: Able to use THRR to govern intensity when exercising independently;Short Term: Able to use daily as guideline for intensity in rehab       Understanding of Exercise Prescription Yes       Intervention Provide education, explanation, and written materials on patient's individual exercise prescription       Expected Outcomes Short Term: Able to explain program exercise prescription;Long Term: Able to explain home exercise prescription to exercise independently                Exercise Goals Re-Evaluation :   Discharge Exercise Prescription (Final Exercise Prescription Changes):   Nutrition:  Target Goals: Understanding of nutrition guidelines, daily intake of sodium 1500mg , cholesterol 200mg , calories 30% from fat and 7% or less from saturated fats, daily to have 5 or more servings of fruits and vegetables.  Biometrics:    Nutrition Therapy Plan and Nutrition Goals:   Nutrition Assessments:  MEDIFICTS Score Key: >=70 Need to make dietary changes  40-70 Heart Healthy Diet <= 40 Therapeutic Level Cholesterol Diet   Picture Your Plate Scores: <45 Unhealthy dietary pattern with much room for improvement. 41-50 Dietary pattern unlikely to meet recommendations for good health and room for improvement. 51-60 More healthful dietary pattern, with some room for improvement.  >60 Healthy dietary pattern, although there may be some specific behaviors that could be improved.    Nutrition Goals Re-Evaluation:   Nutrition Goals Discharge (Final Nutrition Goals Re-Evaluation):   Psychosocial: Target Goals: Acknowledge presence or absence of significant depression and/or stress,  maximize coping skills, provide positive support system. Participant is able to verbalize types and ability to use techniques and skills needed for reducing stress and depression.  Initial Review & Psychosocial Screening:  Initial Psych Review & Screening - 04/21/23 1331       Family Dynamics   Good Support System? Yes    Comments [name] wife      Barriers   Psychosocial barriers to participate in program There are no identifiable barriers or psychosocial needs.;The patient should benefit from training in stress management and relaxation.      Screening Interventions   Interventions Encouraged to exercise    Expected Outcomes Short Term goal: Utilizing psychosocial counselor, staff and physician to assist with identification of specific Stressors or current issues interfering with healing process. Setting desired goal for each stressor or current issue identified.;Long Term Goal: Stressors or current issues are controlled or eliminated.;Short Term goal: Identification and review with participant of any Quality of Life or Depression concerns found by scoring the questionnaire.;Long Term goal: The participant improves quality of Life and PHQ9 Scores as seen by post scores and/or verbalization of changes             Quality of Life Scores:  Scores of 19 and below usually indicate a poorer quality of life in these areas.  A difference of  2-3 points is a clinically meaningful difference.  A difference of 2-3 points in the total score of the Quality of Life Index has been associated with significant improvement in overall quality of life, self-image, physical symptoms, and general health in studies assessing change in quality of life.  PHQ-9: Review Flowsheet  More data exists      04/21/2023 04/16/2023 09/05/2022 01/04/2022 12/27/2020  Depression screen PHQ 2/9  Decreased Interest 0 0 0 0 0 0  Down, Depressed, Hopeless 0 0 0 0 0 0  PHQ - 2 Score  0 0 0 0 0 0  Altered sleeping 0 - - - -  Tired,  decreased energy 0 - - - -  Change in appetite 0 - - - -  Feeling bad or failure about yourself  1 - - - -  Trouble concentrating 0 - - - -  Moving slowly or fidgety/restless 0 - - - -  Suicidal thoughts 0 - - - -  PHQ-9 Score 1 - - - -  Difficult doing work/chores Not difficult at all - - - -    Details       Multiple values from one day are sorted in reverse-chronological order        Interpretation of Total Score  Total Score Depression Severity:  1-4 = Minimal depression, 5-9 = Mild depression, 10-14 = Moderate depression, 15-19 = Moderately severe depression, 20-27 = Severe depression   Psychosocial Evaluation and Intervention:  Psychosocial Evaluation - 04/21/23 1332       Psychosocial Evaluation & Interventions   Interventions Encouraged to exercise with the program and follow exercise prescription    Expected Outcomes For pt to participate in PR    Continue Psychosocial Services  No Follow up required             Psychosocial Re-Evaluation:   Psychosocial Discharge (Final Psychosocial Re-Evaluation):   Education: Education Goals: Education classes will be provided on a weekly basis, covering required topics. Participant will state understanding/return demonstration of topics presented.  Learning Barriers/Preferences:  Learning Barriers/Preferences - 04/21/23 1332       Learning Barriers/Preferences   Learning Barriers Hearing   mild hearing loss   Learning Preferences Video;Individual Instruction;Written Material             Education Topics: Know Your Numbers Group instruction that is supported by a PowerPoint presentation. Instructor discusses importance of knowing and understanding resting, exercise, and post-exercise oxygen saturation, heart rate, and blood pressure. Oxygen saturation, heart rate, blood pressure, rating of perceived exertion, and dyspnea are reviewed along with a normal range for these values.    Exercise for the Pulmonary  Patient Group instruction that is supported by a PowerPoint presentation. Instructor discusses benefits of exercise, core components of exercise, frequency, duration, and intensity of an exercise routine, importance of utilizing pulse oximetry during exercise, safety while exercising, and options of places to exercise outside of rehab.    MET Level  Group instruction provided by PowerPoint, verbal discussion, and written material to support subject matter. Instructor reviews what METs are and how to increase METs.    Pulmonary Medications Verbally interactive group education provided by instructor with focus on inhaled medications and proper administration.   Anatomy and Physiology of the Respiratory System Group instruction provided by PowerPoint, verbal discussion, and written material to support subject matter. Instructor reviews respiratory cycle and anatomical components of the respiratory system and their functions. Instructor also reviews differences in obstructive and restrictive respiratory diseases with examples of each.    Oxygen Safety Group instruction provided by PowerPoint, verbal discussion, and written material to support subject matter. There is an overview of "What is Oxygen" and "Why do we need it".  Instructor also reviews how to create a safe environment for oxygen use, the importance of using oxygen as prescribed, and the risks of noncompliance. There is a brief discussion on traveling with oxygen and resources the patient may utilize.   Oxygen Use Group instruction provided by PowerPoint, verbal discussion, and written material to discuss how supplemental oxygen  is prescribed and different types of oxygen supply systems. Resources for more information are provided.    Breathing Techniques Group instruction that is supported by demonstration and informational handouts. Instructor discusses the benefits of pursed lip and diaphragmatic breathing and detailed  demonstration on how to perform both.     Risk Factor Reduction Group instruction that is supported by a PowerPoint presentation. Instructor discusses the definition of a risk factor, different risk factors for pulmonary disease, and how the heart and lungs work together.   Pulmonary Diseases Group instruction provided by PowerPoint, verbal discussion, and written material to support subject matter. Instructor gives an overview of the different type of pulmonary diseases. There is also a discussion on risk factors and symptoms as well as ways to manage the diseases.   Stress and Energy Conservation Group instruction provided by PowerPoint, verbal discussion, and written material to support subject matter. Instructor gives an overview of stress and the impact it can have on the body. Instructor also reviews ways to reduce stress. There is also a discussion on energy conservation and ways to conserve energy throughout the day.   Warning Signs and Symptoms Group instruction provided by PowerPoint, verbal discussion, and written material to support subject matter. Instructor reviews warning signs and symptoms of stroke, heart attack, cold and flu. Instructor also reviews ways to prevent the spread of infection.   Other Education Group or individual verbal, written, or video instructions that support the educational goals of the pulmonary rehab program.    Knowledge Questionnaire Score:  Knowledge Questionnaire Score - 04/21/23 1334       Knowledge Questionnaire Score   Pre Score 16/18             Core Components/Risk Factors/Patient Goals at Admission:  Personal Goals and Risk Factors at Admission - 04/21/23 1335       Core Components/Risk Factors/Patient Goals on Admission    Weight Management Weight Loss    Improve shortness of breath with ADL's Yes    Intervention Provide education, individualized exercise plan and daily activity instruction to help decrease symptoms of SOB  with activities of daily living.    Expected Outcomes Short Term: Improve cardiorespiratory fitness to achieve a reduction of symptoms when performing ADLs;Long Term: Be able to perform more ADLs without symptoms or delay the onset of symptoms             Core Components/Risk Factors/Patient Goals Review:    Core Components/Risk Factors/Patient Goals at Discharge (Final Review):    ITP Comments:   Comments: Dr. Mechele Collin is Medical Director for Pulmonary Rehab at [facility].

## 2023-04-22 ENCOUNTER — Other Ambulatory Visit: Payer: Self-pay | Admitting: Medical-Surgical

## 2023-04-23 ENCOUNTER — Ambulatory Visit (HOSPITAL_BASED_OUTPATIENT_CLINIC_OR_DEPARTMENT_OTHER)
Admission: RE | Admit: 2023-04-23 | Discharge: 2023-04-23 | Disposition: A | Discharge status: Home / Self Care | Payer: BC Managed Care – PPO | Source: Ambulatory Visit | Attending: Hematology & Oncology | Admitting: Hematology & Oncology

## 2023-04-23 ENCOUNTER — Encounter: Payer: Self-pay | Admitting: *Deleted

## 2023-04-23 ENCOUNTER — Telehealth: Payer: Self-pay | Admitting: *Deleted

## 2023-04-23 DIAGNOSIS — I2782 Chronic pulmonary embolism: Secondary | ICD-10-CM | POA: Insufficient documentation

## 2023-04-23 DIAGNOSIS — C3432 Malignant neoplasm of lower lobe, left bronchus or lung: Secondary | ICD-10-CM | POA: Diagnosis not present

## 2023-04-23 DIAGNOSIS — I2609 Other pulmonary embolism with acute cor pulmonale: Secondary | ICD-10-CM | POA: Insufficient documentation

## 2023-04-23 DIAGNOSIS — I2699 Other pulmonary embolism without acute cor pulmonale: Secondary | ICD-10-CM | POA: Diagnosis not present

## 2023-04-23 DIAGNOSIS — J432 Centrilobular emphysema: Secondary | ICD-10-CM | POA: Diagnosis not present

## 2023-04-23 DIAGNOSIS — C3490 Malignant neoplasm of unspecified part of unspecified bronchus or lung: Secondary | ICD-10-CM | POA: Diagnosis not present

## 2023-04-23 DIAGNOSIS — I2510 Atherosclerotic heart disease of native coronary artery without angina pectoris: Secondary | ICD-10-CM | POA: Diagnosis not present

## 2023-04-23 MED ORDER — IOHEXOL 350 MG/ML SOLN
100.0000 mL | Freq: Once | INTRAVENOUS | Status: AC | PRN
  Administered 2023-04-23: 80 mL via INTRAVENOUS

## 2023-04-23 NOTE — Progress Notes (Signed)
Reviewed CT which is negative for PE and shows treatment response.   Oncology Nurse Navigator Documentation     04/23/2023    1:00 PM  Oncology Nurse Navigator Flowsheets  Navigator Follow Up Date: 05/12/2023  Navigator Follow Up Reason: Follow-up Appointment  Navigator Location [facility]  Navigator Encounter Type Scan Review  Patient Visit Type MedOnc  Treatment Phase Active Tx  Barriers/Navigation Needs No Barriers At This Time  Interventions None Required  Acuity Level 1-No Barriers  Support Groups/Services Friends and Family  Time Spent with Patient 15

## 2023-04-23 NOTE — Telephone Encounter (Signed)
-----   Message from [name] sent at 04/23/2023  3:14 PM EDT ----- Please call and let him know that there is no more blood clot in his lung.  Even better, it is the fact that his tumor has shrunk.  Great job.  Cindee Lame

## 2023-04-27 DIAGNOSIS — J449 Chronic obstructive pulmonary disease, unspecified: Secondary | ICD-10-CM | POA: Diagnosis not present

## 2023-04-27 DIAGNOSIS — R490 Dysphonia: Secondary | ICD-10-CM | POA: Diagnosis not present

## 2023-04-27 DIAGNOSIS — E785 Hyperlipidemia, unspecified: Secondary | ICD-10-CM | POA: Diagnosis not present

## 2023-04-29 ENCOUNTER — Encounter (HOSPITAL_COMMUNITY)
Admission: RE | Admit: 2023-04-29 | Discharge: 2023-04-29 | Disposition: A | Discharge status: Home / Self Care | Payer: BC Managed Care – PPO | Source: Ambulatory Visit | Attending: Internal Medicine | Admitting: Internal Medicine

## 2023-04-29 VITALS — Wt 180.6 lb

## 2023-04-29 DIAGNOSIS — J449 Chronic obstructive pulmonary disease, unspecified: Secondary | ICD-10-CM | POA: Insufficient documentation

## 2023-04-29 NOTE — Progress Notes (Signed)
Daily Session Note  Patient Details  Name: Brandon Phillips. MRN: [id number] Date of Birth: Dec 25, 1965 Referring Provider:   Doristine Devoid Pulmonary Rehab Walk Test from 04/21/2023 in [facility]  Referring Provider Ramaswamy       Encounter Date: 04/29/2023  Check In:  Session Check In - 04/29/23 1332       Check-In   Supervising physician immediately available to respond to emergencies [facility] MD immediately available    Physician(s) Robin Searing, NP    Location [facility]    Staff Present Essie Hart, RN, Doris Cheadle, MS, ACSM-CEP, Exercise Physiologist;Samantha Belarus, RD, LDN;Ngan Qualls Dionisio Paschal, ACSM-CEP, Exercise Physiologist;Casey Katrinka Blazing, RT    Virtual Visit No    Medication changes reported     No    Fall or balance concerns reported    No    Tobacco Cessation No Change    Warm-up and Cool-down Performed as group-led instruction    Resistance Training Performed Yes    VAD Patient? No    PAD/SET Patient? No      Pain Assessment   Currently in Pain? No/denies    Pain Score 0-No pain    Multiple Pain Sites No             Capillary Blood Glucose: No results found for this or any previous visit (from the past 24 hour(s)).   Exercise Prescription Changes - 04/29/23 1500       Response to Exercise   Blood Pressure (Admit) 102/62    Blood Pressure (Exercise) 144/88    Blood Pressure (Exit) 132/64    Heart Rate (Admit) 108 bpm    Heart Rate (Exercise) 127 bpm    Heart Rate (Exit) 118 bpm    Oxygen Saturation (Admit) 89 %    Oxygen Saturation (Exercise) 88 %    Oxygen Saturation (Exit) 93 %    Rating of Perceived Exertion (Exercise) 14    Perceived Dyspnea (Exercise) 2    Duration Continue with 30 min of aerobic exercise without signs/symptoms of physical distress.    Intensity THRR unchanged      Progression   Progression Continue to progress workloads to maintain intensity  without signs/symptoms of physical distress.      Resistance Training   Training Prescription Yes    Weight black bands    Reps 10-15    Time 10 Minutes      Oxygen   Oxygen Continuous    Liters 3      Treadmill   MPH 2    Grade 0    Minutes 15    METs 2.4      Recumbant Elliptical   Level 3    RPM 70    Minutes 15    METs 2             Social History   Tobacco Use  Smoking Status Former   Current packs/day: 0.00   Average packs/day: 1.5 packs/day for 35.0 years (52.5 ttl pk-yrs)   Types: Cigarettes   Start date: 03/30/1982   Quit date: 03/30/2017   Years since quitting: 6.0  Smokeless Tobacco Never    Goals Met:  Exercise tolerated well No report of concerns or symptoms today Strength training completed today  Goals Unmet:  Not Applicable  Comments: Service time is from 1324 to 1436.    Dr. Mechele Collin is Medical Director for Pulmonary Rehab at [facility].

## 2023-04-30 NOTE — Progress Notes (Signed)
Pulmonary Individual Treatment Plan  Patient Details  Name: Brandon Phillips. MRN: [id number] Date of Birth: 07/11/1966 Referring Provider:   Doristine Devoid Pulmonary Rehab Walk Test from 04/21/2023 in [facility]  Referring Provider Ramaswamy       Initial Encounter Date:  Flowsheet Row Pulmonary Rehab Walk Test from 04/21/2023 in [facility]  Date 04/21/23       Visit Diagnosis: Stage 3 severe COPD by GOLD classification (HCC)  Patient's Home Medications on Admission:   Current Outpatient Medications:    albuterol (PROVENTIL) (2.5 MG/3ML) 0.083% nebulizer solution, Take 3 mLs (2.5 mg total) by nebulization every 6 (six) hours as needed for wheezing or shortness of breath., Disp: 75 mL, Rfl: 5   albuterol (VENTOLIN HFA) 108 (90 Base) MCG/ACT inhaler, Inhale 2 puffs into the lungs every 6 (six) hours as needed for wheezing., Disp: 2 each, Rfl: 11   apixaban (ELIQUIS) 5 MG TABS tablet, Take 1 tablet (5 mg total) by mouth 2 (two) times daily., Disp: 60 tablet, Rfl: 12   atorvastatin (LIPITOR) 20 MG tablet, TAKE ONE TABLET BY MOUTH DAILY, Disp: 90 tablet, Rfl: 3   benzonatate (TESSALON) 200 MG capsule, Take 1 capsule (200 mg total) by mouth 3 (three) times daily as needed for cough., Disp: 30 capsule, Rfl: 1   Budeson-Glycopyrrol-Formoterol (BREZTRI AEROSPHERE) 160-9-4.8 MCG/ACT AERO, Inhale 2 puffs into the lungs in the morning and at bedtime., Disp: 1 each, Rfl: 11   COENZYME Q10 PO, Take 100 mg by mouth in the morning., Disp: , Rfl:    gabapentin (NEURONTIN) 300 MG capsule, TAKE 1 CAPSULE BY MOUTH EVERY NIGHT AT BEDTIME FOR 3 NIGHTS, THEN INCREASE TO TAKE 1 CAPSULE BY MOUTH 2 TIMES A DAY FOR 1 WEEK, THEN OK TO INCREASE TO TAKE 1 CAPSULE BY MOUTH 3 TIMES A DAY, Disp: 90 capsule, Rfl: 0   GUAIFENESIN 1200 PO, Take 1,200 mg by mouth in the morning and at bedtime., Disp:  , Rfl:    lidocaine-prilocaine (EMLA) cream, Apply a dime size to port-a-cath 1-2 hours prior to access. Cover with saran wrap., Disp: 30 g, Rfl: 2   Multiple Vitamin (MULTIVITAMIN) capsule, Take 1 capsule by mouth daily., Disp: , Rfl:    telmisartan (MICARDIS) 40 MG tablet, TAKE 1 TABLET BY MOUTH DAILY, Disp: 30 tablet, Rfl: 4  Past Medical History: Past Medical History:  Diagnosis Date   Cancer (HCC)    lung cancer   CAP (community acquired pneumonia)    COPD (chronic obstructive pulmonary disease) (HCC)    Eczema    HLD (hyperlipidemia)    Primary cancer of left lower lobe of lung (HCC) 09/17/2022   Tobacco use disorder 01/24/2017    Tobacco Use: Social History   Tobacco Use  Smoking Status Former   Current packs/day: 0.00   Average packs/day: 1.5 packs/day for 35.0 years (52.5 ttl pk-yrs)   Types: Cigarettes   Start date: 03/30/1982   Quit date: 03/30/2017   Years since quitting: 6.0  Smokeless Tobacco Never    Labs: Review Flowsheet       Latest Ref Rng & Units 02/10/2020 04/06/2020 01/11/2022  Labs for ITP Cardiac and Pulmonary Rehab  Cholestrol <200 mg/dL 409  811  914   LDL (calc) mg/dL (calc) 782  92  88   HDL-C > OR = 40 mg/dL 50  38  47   Trlycerides <150  mg/dL 540  981  90     Details            Capillary Blood Glucose: Lab Results  Component Value Date   GLUCAP 95 01/01/2023   GLUCAP 113 (H) 08/14/2022     Pulmonary Assessment Scores:  Pulmonary Assessment Scores     Row Name 04/21/23 1328         ADL UCSD   ADL Phase Entry     SOB Score total 40       CAT Score   CAT Score 17       mMRC Score   mMRC Score 2             UCSD: Self-administered rating of dyspnea associated with activities of daily living (ADLs) 6-point scale (0 = "not at all" to 5 = "maximal or unable to do because of breathlessness")  Scoring Scores range from 0 to 120.  Minimally important difference is 5 units  CAT: CAT can identify the health impairment of  COPD patients and is better correlated with disease progression.  CAT has a scoring range of zero to 40. The CAT score is classified into four groups of low (less than 10), medium (10 - 20), high (21-30) and very high (31-40) based on the impact level of disease on health status. A CAT score over 10 suggests significant symptoms.  A worsening CAT score could be explained by an exacerbation, poor medication adherence, poor inhaler technique, or progression of COPD or comorbid conditions.  CAT MCID is 2 points  mMRC: mMRC (Modified Medical Research Council) Dyspnea Scale is used to assess the degree of baseline functional disability in patients of respiratory disease due to dyspnea. No minimal important difference is established. A decrease in score of 1 point or greater is considered a positive change.   Pulmonary Function Assessment:  Pulmonary Function Assessment - 04/21/23 1338       Breath   Shortness of Breath No             Exercise Target Goals: Exercise Program Goal: Individual exercise prescription set using results from initial 6 min walk test and THRR while considering  patient's activity barriers and safety.   Exercise Prescription Goal: Initial exercise prescription builds to 30-45 minutes a day of aerobic activity, 2-3 days per week.  Home exercise guidelines will be given to patient during program as part of exercise prescription that the participant will acknowledge.  Activity Barriers & Risk Stratification:  Activity Barriers & Cardiac Risk Stratification - 04/21/23 1338       Activity Barriers & Cardiac Risk Stratification   Activity Barriers Shortness of Breath;Muscular Weakness;Deconditioning    Cardiac Risk Stratification Moderate             6 Minute Walk:  6 Minute Walk     Row Name 04/21/23 1432         6 Minute Walk   Phase Initial     Distance 1060 feet     Walk Time 6 minutes     # of Rest Breaks 0     MPH 2.01     METS 3.03     RPE 11      Perceived Dyspnea  1     VO2 Peak 10.59     Symptoms No     Resting HR 86 bpm     Resting BP 122/76     Resting Oxygen Saturation  90 %     Exercise  Oxygen Saturation  during 6 min walk 86 %     Max Ex. HR 99 bpm     Max Ex. BP 108/60     2 Minute Post BP 106/70       Interval HR   1 Minute HR 94     2 Minute HR 96     3 Minute HR 96     4 Minute HR 97     5 Minute HR 96     6 Minute HR 99     2 Minute Post HR 97     Interval Heart Rate? Yes       Interval Oxygen   Interval Oxygen? Yes     Baseline Oxygen Saturation % 90 %     1 Minute Oxygen Saturation % 90 %     1 Minute Liters of Oxygen 0 L     2 Minute Oxygen Saturation % 87 %  lowest 86 RA, applied 1L     2 Minute Liters of Oxygen 0 L     3 Minute Oxygen Saturation % 88 %     3 Minute Liters of Oxygen 1 L     4 Minute Oxygen Saturation % 89 %     4 Minute Liters of Oxygen 1 L     5 Minute Oxygen Saturation % 89 %     5 Minute Liters of Oxygen 1 L     6 Minute Oxygen Saturation % 90 %     6 Minute Liters of Oxygen 1 L     2 Minute Post Oxygen Saturation % 96 %     2 Minute Post Liters of Oxygen 1 L              Oxygen Initial Assessment:  Oxygen Initial Assessment - 04/21/23 1329       Home Oxygen   Home Oxygen Device Home Concentrator;Portable Concentrator    Sleep Oxygen Prescription Continuous    Liters per minute 2.5    Home Exercise Oxygen Prescription Pulsed    Liters per minute 3    Home Resting Oxygen Prescription None    Compliance with Home Oxygen Use Yes      Initial 6 min Walk   Oxygen Used Continuous    Liters per minute 1      Program Oxygen Prescription   Program Oxygen Prescription Continuous    Liters per minute 1      Intervention   Short Term Goals To learn and exhibit compliance with exercise, home and travel O2 prescription;To learn and understand importance of maintaining oxygen saturations>88%;To learn and demonstrate proper use of respiratory medications;To learn and  understand importance of monitoring SPO2 with pulse oximeter and demonstrate accurate use of the pulse oximeter.;To learn and demonstrate proper pursed lip breathing techniques or other breathing techniques. ;Other    Long  Term Goals Other;Demonstrates proper use of MDI's;Compliance with respiratory medication;Exhibits proper breathing techniques, such as pursed lip breathing or other method taught during program session;Maintenance of O2 saturations>88%;Verbalizes importance of monitoring SPO2 with pulse oximeter and return demonstration;Exhibits compliance with exercise, home  and travel O2 prescription             Oxygen Re-Evaluation:  Oxygen Re-Evaluation     Row Name 04/22/23 0839             Program Oxygen Prescription   Program Oxygen Prescription Continuous       Liters per minute 1  Comments pt to begin exercise 10/1         Home Oxygen   Home Oxygen Device Home Concentrator;Portable Concentrator       Sleep Oxygen Prescription Continuous       Liters per minute 2.5       Home Exercise Oxygen Prescription Pulsed       Liters per minute 3       Home Resting Oxygen Prescription None       Compliance with Home Oxygen Use Yes         Goals/Expected Outcomes   Short Term Goals To learn and exhibit compliance with exercise, home and travel O2 prescription;To learn and understand importance of maintaining oxygen saturations>88%;To learn and demonstrate proper use of respiratory medications;To learn and understand importance of monitoring SPO2 with pulse oximeter and demonstrate accurate use of the pulse oximeter.;To learn and demonstrate proper pursed lip breathing techniques or other breathing techniques. ;Other       Long  Term Goals Other;Demonstrates proper use of MDI's;Compliance with respiratory medication;Exhibits proper breathing techniques, such as pursed lip breathing or other method taught during program session;Maintenance of O2 saturations>88%;Verbalizes  importance of monitoring SPO2 with pulse oximeter and return demonstration;Exhibits compliance with exercise, home  and travel O2 prescription       Goals/Expected Outcomes Compliance and understanding of oxygen saturation monitoring and breathing techniques to decrease shortness of breath                Oxygen Discharge (Final Oxygen Re-Evaluation):  Oxygen Re-Evaluation - 04/22/23 0839       Program Oxygen Prescription   Program Oxygen Prescription Continuous    Liters per minute 1    Comments pt to begin exercise 10/1      Home Oxygen   Home Oxygen Device Home Concentrator;Portable Concentrator    Sleep Oxygen Prescription Continuous    Liters per minute 2.5    Home Exercise Oxygen Prescription Pulsed    Liters per minute 3    Home Resting Oxygen Prescription None    Compliance with Home Oxygen Use Yes      Goals/Expected Outcomes   Short Term Goals To learn and exhibit compliance with exercise, home and travel O2 prescription;To learn and understand importance of maintaining oxygen saturations>88%;To learn and demonstrate proper use of respiratory medications;To learn and understand importance of monitoring SPO2 with pulse oximeter and demonstrate accurate use of the pulse oximeter.;To learn and demonstrate proper pursed lip breathing techniques or other breathing techniques. ;Other    Long  Term Goals Other;Demonstrates proper use of MDI's;Compliance with respiratory medication;Exhibits proper breathing techniques, such as pursed lip breathing or other method taught during program session;Maintenance of O2 saturations>88%;Verbalizes importance of monitoring SPO2 with pulse oximeter and return demonstration;Exhibits compliance with exercise, home  and travel O2 prescription    Goals/Expected Outcomes Compliance and understanding of oxygen saturation monitoring and breathing techniques to decrease shortness of breath             Initial Exercise Prescription:  Initial  Exercise Prescription - 04/21/23 1400       Date of Initial Exercise RX and Referring Provider   Date 04/21/23    Referring Provider Ramaswamy    Expected Discharge Date 04/21/23      Oxygen   Oxygen Continuous    Liters 1    Maintain Oxygen Saturation 88% or higher      Treadmill   MPH 2    Grade 0    Minutes 15  METs 3      Recumbant Elliptical   Level 3    RPM 70    Minutes 15    METs 3      Prescription Details   Frequency (times per week) 2    Duration Progress to 30 minutes of continuous aerobic without signs/symptoms of physical distress      Intensity   THRR 40-80% of Max Heartrate 66-132    Ratings of Perceived Exertion 11-13    Perceived Dyspnea 0-4      Progression   Progression Continue to progress workloads to maintain intensity without signs/symptoms of physical distress.      Resistance Training   Training Prescription Yes    Weight blue bands    Reps 10-15             Perform Capillary Blood Glucose checks as needed.  Exercise Prescription Changes:   Exercise Prescription Changes     Row Name 04/29/23 1500             Response to Exercise   Blood Pressure (Admit) 102/62       Blood Pressure (Exercise) 144/88       Blood Pressure (Exit) 132/64       Heart Rate (Admit) 108 bpm       Heart Rate (Exercise) 127 bpm       Heart Rate (Exit) 118 bpm       Oxygen Saturation (Admit) 89 %       Oxygen Saturation (Exercise) 88 %       Oxygen Saturation (Exit) 93 %       Rating of Perceived Exertion (Exercise) 14       Perceived Dyspnea (Exercise) 2       Duration Continue with 30 min of aerobic exercise without signs/symptoms of physical distress.       Intensity THRR unchanged         Progression   Progression Continue to progress workloads to maintain intensity without signs/symptoms of physical distress.         Resistance Training   Training Prescription Yes       Weight black bands       Reps 10-15       Time 10 Minutes          Oxygen   Oxygen Continuous       Liters 3         Treadmill   MPH 2       Grade 0       Minutes 15       METs 2.4         Recumbant Elliptical   Level 3       RPM 70       Minutes 15       METs 2                Exercise Comments:   Exercise Comments     Row Name 04/29/23 1512           Exercise Comments Pt completed first day of group exercise. He exercised on the recumbent elliptical for 15 min, level 3, METs 2.0. He then walked on the treadmill for 15 min, 2.0 mph, 0 incline, METs 2.4. Pt tolerated exercise well and performed warm up and cool down unlimited. We discussed METs with good perception.                Exercise Goals and Review:  Exercise Goals     Row Name 04/21/23 1339             Exercise Goals   Increase Physical Activity Yes       Intervention Provide advice, education, support and counseling about physical activity/exercise needs.;Develop an individualized exercise prescription for aerobic and resistive training based on initial evaluation findings, risk stratification, comorbidities and participant's personal goals.       Expected Outcomes Short Term: Attend rehab on a regular basis to increase amount of physical activity.;Long Term: Exercising regularly at least 3-5 days a week.;Long Term: Add in home exercise to make exercise part of routine and to increase amount of physical activity.       Increase Strength and Stamina Yes       Intervention Provide advice, education, support and counseling about physical activity/exercise needs.;Develop an individualized exercise prescription for aerobic and resistive training based on initial evaluation findings, risk stratification, comorbidities and participant's personal goals.       Expected Outcomes Short Term: Increase workloads from initial exercise prescription for resistance, speed, and METs.;Short Term: Perform resistance training exercises routinely during rehab and add in resistance  training at home;Long Term: Improve cardiorespiratory fitness, muscular endurance and strength as measured by increased METs and functional capacity ( )       Able to understand and use rate of perceived exertion (RPE) scale Yes       Intervention Provide education and explanation on how to use RPE scale       Expected Outcomes Short Term: Able to use RPE daily in rehab to express subjective intensity level;Long Term:  Able to use RPE to guide intensity level when exercising independently       Able to understand and use Dyspnea scale Yes       Intervention Provide education and explanation on how to use Dyspnea scale       Expected Outcomes Short Term: Able to use Dyspnea scale daily in rehab to express subjective sense of shortness of breath during exertion;Long Term: Able to use Dyspnea scale to guide intensity level when exercising independently       Knowledge and understanding of Target Heart Rate Range (THRR) Yes       Intervention Provide education and explanation of THRR including how the numbers were predicted and where they are located for reference       Expected Outcomes Short Term: Able to state/look up THRR;Long Term: Able to use THRR to govern intensity when exercising independently;Short Term: Able to use daily as guideline for intensity in rehab       Understanding of Exercise Prescription Yes       Intervention Provide education, explanation, and written materials on patient's individual exercise prescription       Expected Outcomes Short Term: Able to explain program exercise prescription;Long Term: Able to explain home exercise prescription to exercise independently                Exercise Goals Re-Evaluation :  Exercise Goals Re-Evaluation     Row Name 04/22/23 0831             Exercise Goal Re-Evaluation   Exercise Goals Review Increase Physical Activity;Able to understand and use Dyspnea scale;Understanding of Exercise Prescription;Increase Strength and  Stamina;Knowledge and understanding of Target Heart Rate Range (THRR);Able to understand and use rate of perceived exertion (RPE) scale       Comments Pt to begin exercise 10/1. Will progress as tolerated.  Expected Outcomes Through exercise at rehab and home, the patient will decrease shortness of breath with daily activities and feel confident in carrying out an exercise regimen at home                Discharge Exercise Prescription (Final Exercise Prescription Changes):  Exercise Prescription Changes - 04/29/23 1500       Response to Exercise   Blood Pressure (Admit) 102/62    Blood Pressure (Exercise) 144/88    Blood Pressure (Exit) 132/64    Heart Rate (Admit) 108 bpm    Heart Rate (Exercise) 127 bpm    Heart Rate (Exit) 118 bpm    Oxygen Saturation (Admit) 89 %    Oxygen Saturation (Exercise) 88 %    Oxygen Saturation (Exit) 93 %    Rating of Perceived Exertion (Exercise) 14    Perceived Dyspnea (Exercise) 2    Duration Continue with 30 min of aerobic exercise without signs/symptoms of physical distress.    Intensity THRR unchanged      Progression   Progression Continue to progress workloads to maintain intensity without signs/symptoms of physical distress.      Resistance Training   Training Prescription Yes    Weight black bands    Reps 10-15    Time 10 Minutes      Oxygen   Oxygen Continuous    Liters 3      Treadmill   MPH 2    Grade 0    Minutes 15    METs 2.4      Recumbant Elliptical   Level 3    RPM 70    Minutes 15    METs 2             Nutrition:  Target Goals: Understanding of nutrition guidelines, daily intake of sodium 1500mg , cholesterol 200mg , calories 30% from fat and 7% or less from saturated fats, daily to have 5 or more servings of fruits and vegetables.  Biometrics:  Pre Biometrics - 04/29/23 1515       Pre Biometrics   Grip Strength 42 kg              Nutrition Therapy Plan and Nutrition Goals:  Nutrition  Therapy & Goals - 04/29/23 1412       Nutrition Therapy   Diet General Healthy Diet    Drug/Food Interactions Statins/Certain Fruits      Personal Nutrition Goals   Nutrition Goal Patient to identify strategies for weight loss of 0.5-2.0# per week.    Personal Goal #2 Patient to improve diet quality by using the plate method as a guide for meal planning to include lean protein/plant protein, fruits, vegetables, whole grains, nonfat dairy as part of a well-balanced diet.    Comments Brandon Phillips reports motivation to lose weight. He is up ~20# since June but he reports weight gain since starting cancer treatment. 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 His wife is a good support. Patient will benefit from participation in pulmonary rehab for nutrition, exercise, and lifestyle modification.      Intervention Plan   Intervention Prescribe, educate and counsel regarding individualized specific dietary modifications aiming towards targeted core components such as weight, hypertension, lipid management, diabetes, heart failure and other comorbidities.;Nutrition handout(s) given to patient.    Expected Outcomes Short Term Goal: Understand basic principles of dietary content, such as calories, fat, sodium, cholesterol and nutrients.;Long Term Goal: Adherence to  prescribed nutrition plan.             Nutrition Assessments:  Nutrition Assessments - 04/29/23 1509       Rate Your Plate Scores   Pre Score 41            MEDIFICTS Score Key: >=70 Need to make dietary changes  40-70 Heart Healthy Diet <= 40 Therapeutic Level Cholesterol Diet  Flowsheet Row PULMONARY REHAB CHRONIC OBSTRUCTIVE PULMONARY DISEASE from [facility]/29/2023 in [facility]  Picture Your Plate Total Score on Admission 41      Picture Your Plate Scores: <11 Unhealthy dietary pattern  with much room for improvement. 41-50 Dietary pattern unlikely to meet recommendations for good health and room for improvement. 51-60 More healthful dietary pattern, with some room for improvement.  >60 Healthy dietary pattern, although there may be some specific behaviors that could be improved.    Nutrition Goals Re-Evaluation:  Nutrition Goals Re-Evaluation     Row Name 04/29/23 1412             Goals   Current Weight 180 lb 8.9 oz (81.9 kg)       Comment lipids WNL, LDL 88, CBC WNL       Expected Outcome Brandon Phillips reports motivation to lose weight. He is up ~20# since June 2024 but he reports weight gain since starting cancer treatment. 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 His wife is a good support. Patient will benefit from participation in pulmonary rehab for nutrition, exercise, and lifestyle modification.                Nutrition Goals Discharge (Final Nutrition Goals Re-Evaluation):  Nutrition Goals Re-Evaluation - 04/29/23 1412       Goals   Current Weight 180 lb 8.9 oz (81.9 kg)    Comment lipids WNL, LDL 88, CBC WNL    Expected Outcome Brandon Phillips reports motivation to lose weight. He is up ~20# since June 2024 but he reports weight gain since starting cancer treatment. 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 His wife is a good support. Patient will benefit from participation in pulmonary rehab for nutrition, exercise, and lifestyle modification.             Psychosocial: Target Goals: Acknowledge presence or absence of significant depression and/or stress, maximize coping skills, provide positive support system. Participant is able to verbalize types and ability to use techniques and skills needed for reducing stress and depression.  Initial Review & Psychosocial Screening:  Initial  Psych Review & Screening - 04/21/23 1331       Family Dynamics   Good Support System? Yes    Comments [name] wife      Barriers   Psychosocial barriers to participate in program There are no identifiable barriers or psychosocial needs.;The patient should benefit from training in stress management and relaxation.      Screening Interventions   Interventions Encouraged to exercise    Expected Outcomes Short Term goal: Utilizing psychosocial counselor, staff and physician to assist with identification of specific Stressors or current issues interfering with healing process. Setting desired goal for each stressor or current issue identified.;Long Term Goal: Stressors or current issues are controlled or eliminated.;Short Term goal: Identification and review with participant of any Quality of Life or Depression concerns found by scoring  the questionnaire.;Long Term goal: The participant improves quality of Life and PHQ9 Scores as seen by post scores and/or verbalization of changes             Quality of Life Scores:  Scores of 19 and below usually indicate a poorer quality of life in these areas.  A difference of  2-3 points is a clinically meaningful difference.  A difference of 2-3 points in the total score of the Quality of Life Index has been associated with significant improvement in overall quality of life, self-image, physical symptoms, and general health in studies assessing change in quality of life.  PHQ-9: Review Flowsheet  More data exists      04/21/2023 04/16/2023 09/05/2022 01/04/2022 12/27/2020  Depression screen PHQ 2/9  Decreased Interest 0 0 0 0 0 0  Down, Depressed, Hopeless 0 0 0 0 0 0  PHQ - 2 Score 0 0 0 0 0 0  Altered sleeping 0 - - - -  Tired, decreased energy 0 - - - -  Change in appetite 0 - - - -  Feeling bad or failure about yourself  1 - - - -  Trouble concentrating 0 - - - -  Moving slowly or fidgety/restless 0 - - - -  Suicidal thoughts 0 - - - -  PHQ-9 Score  1 - - - -  Difficult doing work/chores Not difficult at all - - - -    Details       Multiple values from one day are sorted in reverse-chronological order        Interpretation of Total Score  Total Score Depression Severity:  1-4 = Minimal depression, 5-9 = Mild depression, 10-14 = Moderate depression, 15-19 = Moderately severe depression, 20-27 = Severe depression   Psychosocial Evaluation and Intervention:  Psychosocial Evaluation - 04/21/23 1332       Psychosocial Evaluation & Interventions   Interventions Encouraged to exercise with the program and follow exercise prescription    Expected Outcomes For pt to participate in PR    Continue Psychosocial Services  No Follow up required             Psychosocial Re-Evaluation:  Psychosocial Re-Evaluation     Row Name 04/23/23 1133             Psychosocial Re-Evaluation   Current issues with None Identified       Comments No changes since orientation. Brandon Phillips is scheduled to start the program on 04/29/2023       Expected Outcomes For Brandon Phillips to attend PR free of psychosocial barriers or concerns.       Interventions Encouraged to attend Pulmonary Rehabilitation for the exercise       Continue Psychosocial Services  No Follow up required                Psychosocial Discharge (Final Psychosocial Re-Evaluation):  Psychosocial Re-Evaluation - 04/23/23 1133       Psychosocial Re-Evaluation   Current issues with None Identified    Comments No changes since orientation. Brandon Phillips is scheduled to start the program on 04/29/2023    Expected Outcomes For Brandon Phillips to attend PR free of psychosocial barriers or concerns.    Interventions Encouraged to attend Pulmonary Rehabilitation for the exercise    Continue Psychosocial Services  No Follow up required             Education: Education Goals: Education classes will be provided on a weekly basis, covering required topics. Participant  will state understanding/return  demonstration of topics presented.  Learning Barriers/Preferences:  Learning Barriers/Preferences - 04/21/23 1332       Learning Barriers/Preferences   Learning Barriers Hearing   mild hearing loss   Learning Preferences Video;Individual Instruction;Written Material             Education Topics: Know Your Numbers Group instruction that is supported by a PowerPoint presentation. Instructor discusses importance of knowing and understanding resting, exercise, and post-exercise oxygen saturation, heart rate, and blood pressure. Oxygen saturation, heart rate, blood pressure, rating of perceived exertion, and dyspnea are reviewed along with a normal range for these values.    Exercise for the Pulmonary Patient Group instruction that is supported by a PowerPoint presentation. Instructor discusses benefits of exercise, core components of exercise, frequency, duration, and intensity of an exercise routine, importance of utilizing pulse oximetry during exercise, safety while exercising, and options of places to exercise outside of rehab.    MET Level  Group instruction provided by PowerPoint, verbal discussion, and written material to support subject matter. Instructor reviews what METs are and how to increase METs.    Pulmonary Medications Verbally interactive group education provided by instructor with focus on inhaled medications and proper administration.   Anatomy and Physiology of the Respiratory System Group instruction provided by PowerPoint, verbal discussion, and written material to support subject matter. Instructor reviews respiratory cycle and anatomical components of the respiratory system and their functions. Instructor also reviews differences in obstructive and restrictive respiratory diseases with examples of each.    Oxygen Safety Group instruction provided by PowerPoint, verbal discussion, and written material to support subject matter. There is an overview of "What is  Oxygen" and "Why do we need it".  Instructor also reviews how to create a safe environment for oxygen use, the importance of using oxygen as prescribed, and the risks of noncompliance. There is a brief discussion on traveling with oxygen and resources the patient may utilize.   Oxygen Use Group instruction provided by PowerPoint, verbal discussion, and written material to discuss how supplemental oxygen is prescribed and different types of oxygen supply systems. Resources for more information are provided.    Breathing Techniques Group instruction that is supported by demonstration and informational handouts. Instructor discusses the benefits of pursed lip and diaphragmatic breathing and detailed demonstration on how to perform both.     Risk Factor Reduction Group instruction that is supported by a PowerPoint presentation. Instructor discusses the definition of a risk factor, different risk factors for pulmonary disease, and how the heart and lungs work together.   Pulmonary Diseases Group instruction provided by PowerPoint, verbal discussion, and written material to support subject matter. Instructor gives an overview of the different type of pulmonary diseases. There is also a discussion on risk factors and symptoms as well as ways to manage the diseases.   Stress and Energy Conservation Group instruction provided by PowerPoint, verbal discussion, and written material to support subject matter. Instructor gives an overview of stress and the impact it can have on the body. Instructor also reviews ways to reduce stress. There is also a discussion on energy conservation and ways to conserve energy throughout the day.   Warning Signs and Symptoms Group instruction provided by PowerPoint, verbal discussion, and written material to support subject matter. Instructor reviews warning signs and symptoms of stroke, heart attack, cold and flu. Instructor also reviews ways to prevent the spread of  infection.   Other Education Group or individual verbal, written, or  video instructions that support the educational goals of the pulmonary rehab program.    Knowledge Questionnaire Score:  Knowledge Questionnaire Score - 04/21/23 1334       Knowledge Questionnaire Score   Pre Score 16/18             Core Components/Risk Factors/Patient Goals at Admission:  Personal Goals and Risk Factors at Admission - 04/21/23 1335       Core Components/Risk Factors/Patient Goals on Admission    Weight Management Weight Loss    Improve shortness of breath with ADL's Yes    Intervention Provide education, individualized exercise plan and daily activity instruction to help decrease symptoms of SOB with activities of daily living.    Expected Outcomes Short Term: Improve cardiorespiratory fitness to achieve a reduction of symptoms when performing ADLs;Long Term: Be able to perform more ADLs without symptoms or delay the onset of symptoms             Core Components/Risk Factors/Patient Goals Review:   Goals and Risk Factor Review     Row Name 04/23/23 1141             Core Components/Risk Factors/Patient Goals Review   Personal Goals Review Weight Management/Obesity;Improve shortness of breath with ADL's;Develop more efficient breathing techniques such as purse lipped breathing and diaphragmatic breathing and practicing self-pacing with activity.       Review Unable to assess. Brandon Phillips is scheduled to start the program on 04/29/23       Expected Outcomes For Brandon Phillips to lose weight, improve his shortness of breath with ADLs, develop more efficient breathing techniques, learn how to self-pace.                Core Components/Risk Factors/Patient Goals at Discharge (Final Review):   Goals and Risk Factor Review - 04/23/23 1141       Core Components/Risk Factors/Patient Goals Review   Personal Goals Review Weight Management/Obesity;Improve shortness of breath with ADL's;Develop more  efficient breathing techniques such as purse lipped breathing and diaphragmatic breathing and practicing self-pacing with activity.    Review Unable to assess. Brandon Phillips is scheduled to start the program on 04/29/23    Expected Outcomes For Brandon Phillips to lose weight, improve his shortness of breath with ADLs, develop more efficient breathing techniques, learn how to self-pace.             ITP Comments:Pt is making expected progress toward Pulmonary Rehab goals after completing 1 sessions. Recommend continued exercise, life style modification, education, and utilization of breathing techniques to increase stamina and strength, while also decreasing shortness of breath with exertion.  Dr. Mechele Collin is Medical Director for Pulmonary Rehab at [facility].     Comments: Dr. Mechele Collin is Medical Director for Pulmonary Rehab at [facility].

## 2023-05-01 ENCOUNTER — Encounter (HOSPITAL_COMMUNITY)
Admission: RE | Admit: 2023-05-01 | Discharge: 2023-05-01 | Disposition: A | Discharge status: Home / Self Care | Payer: BC Managed Care – PPO | Source: Ambulatory Visit | Attending: Internal Medicine | Admitting: Internal Medicine

## 2023-05-01 DIAGNOSIS — J449 Chronic obstructive pulmonary disease, unspecified: Secondary | ICD-10-CM

## 2023-05-01 NOTE — Progress Notes (Signed)
Daily Session Note  Patient Details  Name: Brandon Phillips. MRN: [id number] Date of Birth: 21-Oct-1965 Referring Provider:   Doristine Devoid Pulmonary Rehab Walk Test from 04/21/2023 in [facility] for Heart, Vascular, & Lung Health  Referring Provider Ramaswamy       Encounter Date: 05/01/2023  Check In:  Session Check In - 05/01/23 1429       Check-In   Supervising physician immediately available to respond to emergencies [facility] MD immediately available    Physician(s) Joni Reining    Location [facility]    Staff Present Essie Hart, RN, Doris Cheadle, MS, ACSM-CEP, Exercise Physiologist;Randi Dionisio Paschal, ACSM-CEP, Exercise Physiologist;Danyeal Akens Katrinka Blazing, RT    Virtual Visit No    Medication changes reported     No    Fall or balance concerns reported    Yes    Comments Pt tripped in class getting on treadmill. No injuries.    Tobacco Cessation No Change    Warm-up and Cool-down Performed as group-led instruction    Resistance Training Performed Yes    VAD Patient? No    PAD/SET Patient? No      Pain Assessment   Currently in Pain? No/denies    Pain Score 0-No pain    Multiple Pain Sites No             Capillary Blood Glucose: No results found for this or any previous visit (from the past 24 hour(s)).    Social History   Tobacco Use  Smoking Status Former   Current packs/day: 0.00   Average packs/day: 1.5 packs/day for 35.0 years (52.5 ttl pk-yrs)   Types: Cigarettes   Start date: 03/30/1982   Quit date: 03/30/2017   Years since quitting: 6.0  Smokeless Tobacco Never    Goals Met:  Proper associated with RPD/PD & O2 Sat Independence with exercise equipment Exercise tolerated well No report of concerns or symptoms today Strength training completed today  Goals Unmet:  Not Applicable  Comments: Service time is from 1321 to 1458.    Dr. Mechele Collin is Medical Director for Pulmonary Rehab at [facility].

## 2023-05-02 NOTE — Progress Notes (Signed)
Tammy Sours was transitioning to the treadmill. He went to step up on the treadmill. His toe hit the side the treadmill as he was stepping up, causing him to trip while stepping onto the treadmill. He reached out for the tank for support and fell on his side. He did not hit his head. Pt denied being injured. RN assessed pt and no abnormal findings.

## 2023-05-06 ENCOUNTER — Encounter (HOSPITAL_COMMUNITY)
Admission: RE | Admit: 2023-05-06 | Discharge: 2023-05-06 | Disposition: A | Discharge status: Home / Self Care | Payer: BC Managed Care – PPO | Source: Ambulatory Visit | Attending: Internal Medicine

## 2023-05-06 DIAGNOSIS — J449 Chronic obstructive pulmonary disease, unspecified: Secondary | ICD-10-CM

## 2023-05-06 NOTE — Progress Notes (Signed)
Daily Session Note  Patient Details  Name: Brandon Phillips. MRN: [id number] Date of Birth: 1965/11/06 Referring Provider:   Doristine Devoid Pulmonary Rehab Walk Test from 04/21/2023 in [facility] for Heart, Vascular, & Lung Health  Referring Provider Ramaswamy       Encounter Date: 05/06/2023  Check In:  Session Check In - 05/06/23 1332       Check-In   Supervising physician immediately available to respond to emergencies [facility] MD immediately available    Physician(s) Joni Reining, NP    Location [facility]    Staff Present Raford Pitcher, MS, ACSM-CEP, Exercise Physiologist;Randi Dionisio Paschal, ACSM-CEP, Exercise Physiologist;Casey Katrinka Blazing, RT    Virtual Visit No    Medication changes reported     No    Fall or balance concerns reported    Yes    Comments Pt tripped in class getting on treadmill. No injuries.    Tobacco Cessation No Change    Warm-up and Cool-down Performed as group-led instruction    Resistance Training Performed Yes    VAD Patient? No    PAD/SET Patient? No      Pain Assessment   Currently in Pain? No/denies    Pain Score 0-No pain    Multiple Pain Sites No             Capillary Blood Glucose: No results found for this or any previous visit (from the past 24 hour(s)).    Social History   Tobacco Use  Smoking Status Former   Current packs/day: 0.00   Average packs/day: 1.5 packs/day for 35.0 years (52.5 ttl pk-yrs)   Types: Cigarettes   Start date: 03/30/1982   Quit date: 03/30/2017   Years since quitting: 6.1  Smokeless Tobacco Never    Goals Met:  Proper associated with RPD/PD & O2 Sat Exercise tolerated well No report of concerns or symptoms today Strength training completed today  Goals Unmet:  Not Applicable  Comments: Service time is from 1310 to 1442.    Dr. Mechele Collin is Medical Director for Pulmonary Rehab at [facility].

## 2023-05-08 ENCOUNTER — Encounter (HOSPITAL_COMMUNITY)
Admission: RE | Admit: 2023-05-08 | Discharge: 2023-05-08 | Disposition: A | Discharge status: Home / Self Care | Payer: BC Managed Care – PPO | Source: Ambulatory Visit | Attending: Internal Medicine | Admitting: Internal Medicine

## 2023-05-08 DIAGNOSIS — J449 Chronic obstructive pulmonary disease, unspecified: Secondary | ICD-10-CM | POA: Diagnosis not present

## 2023-05-08 NOTE — Progress Notes (Addendum)
Daily Session Note  Patient Details  Name: Brandon Phillips. MRN: [id number] Date of Birth: 11/24/1965 Referring Provider:   Doristine Devoid Pulmonary Rehab Walk Test from 04/21/2023 in [facility] for Heart, Vascular, & Lung Health  Referring Provider Ramaswamy       Encounter Date: 05/08/2023  Check In:  Session Check In - 05/08/23 1347       Check-In   Supervising physician immediately available to respond to emergencies [facility] MD immediately available    Physician(s) Jari Favre, NP    Location [facility]    Staff Present Raford Pitcher, MS, ACSM-CEP, Exercise Physiologist;Randi Dionisio Paschal, ACSM-CEP, Exercise Physiologist;Latravious Levitt Synthia Innocent, RN, BSN    Virtual Visit No    Medication changes reported     No    Fall or balance concerns reported    No    Tobacco Cessation No Change    Warm-up and Cool-down Performed as group-led instruction    Resistance Training Performed Yes    VAD Patient? No    PAD/SET Patient? No      Pain Assessment   Currently in Pain? No/denies    Pain Score 0-No pain    Multiple Pain Sites No             Capillary Blood Glucose: No results found for this or any previous visit (from the past 24 hour(s)).    Social History   Tobacco Use  Smoking Status Former   Current packs/day: 0.00   Average packs/day: 1.5 packs/day for 35.0 years (52.5 ttl pk-yrs)   Types: Cigarettes   Start date: 03/30/1982   Quit date: 03/30/2017   Years since quitting: 6.1  Smokeless Tobacco Never    Goals Met:  Proper associated with RPD/PD & O2 Sat Independence with exercise equipment Exercise tolerated well No report of concerns or symptoms today Strength training completed today  Goals Unmet:  Not Applicable  Comments: Service time is from 1316 to 1448.  Dr. Mechele Collin is Medical Director for Pulmonary Rehab at [facility].

## 2023-05-12 ENCOUNTER — Encounter: Payer: Self-pay | Admitting: Hematology & Oncology

## 2023-05-12 ENCOUNTER — Inpatient Hospital Stay: Payer: BC Managed Care – PPO

## 2023-05-12 ENCOUNTER — Inpatient Hospital Stay: Payer: BC Managed Care – PPO | Attending: Hematology & Oncology

## 2023-05-12 ENCOUNTER — Inpatient Hospital Stay (HOSPITAL_BASED_OUTPATIENT_CLINIC_OR_DEPARTMENT_OTHER): Payer: BC Managed Care – PPO | Admitting: Hematology & Oncology

## 2023-05-12 VITALS — BP 114/78 | HR 94 | Temp 98.5°F | Resp 18 | Wt 177.0 lb

## 2023-05-12 VITALS — BP 112/80 | HR 85 | Resp 18

## 2023-05-12 DIAGNOSIS — Z5112 Encounter for antineoplastic immunotherapy: Secondary | ICD-10-CM | POA: Insufficient documentation

## 2023-05-12 DIAGNOSIS — C3432 Malignant neoplasm of lower lobe, left bronchus or lung: Secondary | ICD-10-CM | POA: Diagnosis not present

## 2023-05-12 DIAGNOSIS — Z79899 Other long term (current) drug therapy: Secondary | ICD-10-CM | POA: Diagnosis not present

## 2023-05-12 DIAGNOSIS — I2609 Other pulmonary embolism with acute cor pulmonale: Secondary | ICD-10-CM

## 2023-05-12 LAB — CMP (CANCER CENTER ONLY)
ALT: 24 U/L (ref 0–44)
AST: 17 U/L (ref 15–41)
Albumin: 3.9 g/dL (ref 3.5–5.0)
Alkaline Phosphatase: 57 U/L (ref 38–126)
Anion gap: 9 (ref 5–15)
BUN: 13 mg/dL (ref 6–20)
CO2: 27 mmol/L (ref 22–32)
Calcium: 9.3 mg/dL (ref 8.9–10.3)
Chloride: 101 mmol/L (ref 98–111)
Creatinine: 0.97 mg/dL (ref 0.61–1.24)
GFR, Estimated: >60 mL/min (ref >60)
Glucose, Bld: 152 mg/dL — ABNORMAL HIGH (ref 70–99)
Potassium: 3.9 mmol/L (ref 3.5–5.1)
Sodium: 137 mmol/L (ref 135–145)
Total Bilirubin: 0.3 mg/dL (ref 0.3–1.2)
Total Protein: 7.1 g/dL (ref 6.5–8.1)

## 2023-05-12 LAB — CBC WITH DIFFERENTIAL (CANCER CENTER ONLY)
Abs Immature Granulocytes: 0.04 10*3/uL (ref 0.00–0.07)
Basophils Absolute: 0.1 10*3/uL (ref 0.0–0.1)
Basophils Relative: 1 %
Eosinophils Absolute: 0.2 10*3/uL (ref 0.0–0.5)
Eosinophils Relative: 3 %
HCT: 41.9 % (ref 39.0–52.0)
Hemoglobin: 14 g/dL (ref 13.0–17.0)
Immature Granulocytes: 1 %
Lymphocytes Relative: 8 %
Lymphs Abs: 0.6 10*3/uL — ABNORMAL LOW (ref 0.7–4.0)
MCH: 28.7 pg (ref 26.0–34.0)
MCHC: 33.4 g/dL (ref 30.0–36.0)
MCV: 86 fL (ref 80.0–100.0)
Monocytes Absolute: 0.7 10*3/uL (ref 0.1–1.0)
Monocytes Relative: 9 %
Neutro Abs: 6.2 10*3/uL (ref 1.7–7.7)
Neutrophils Relative %: 78 %
Platelet Count: 244 10*3/uL (ref 150–400)
RBC: 4.87 MIL/uL (ref 4.22–5.81)
RDW: 14.2 % (ref 11.5–15.5)
WBC Count: 7.8 10*3/uL (ref 4.0–10.5)
nRBC: 0 % (ref 0.0–0.2)

## 2023-05-12 LAB — LACTATE DEHYDROGENASE: LDH: 145 U/L (ref 98–192)

## 2023-05-12 MED ORDER — SODIUM CHLORIDE 0.9 % IV SOLN
1500.0000 mg | Freq: Once | INTRAVENOUS | Status: AC
  Administered 2023-05-12: 1500 mg via INTRAVENOUS
  Filled 2023-05-12: qty 30

## 2023-05-12 MED ORDER — SODIUM CHLORIDE 0.9 % IV SOLN: Freq: Once | INTRAVENOUS | Status: AC

## 2023-05-12 MED ORDER — HEPARIN SOD (PORK) LOCK FLUSH 100 UNIT/ML IV SOLN
500.0000 [IU] | Freq: Once | INTRAVENOUS | Status: AC | PRN
  Administered 2023-05-12: 500 [IU]

## 2023-05-12 MED ORDER — APIXABAN 2.5 MG PO TABS: 2.5000 mg | ORAL_TABLET | Freq: Two times a day (BID) | ORAL | 12 refills | Status: DC

## 2023-05-12 MED ORDER — SODIUM CHLORIDE 0.9% FLUSH
10.0000 mL | INTRAVENOUS | Status: DC | PRN
  Administered 2023-05-12: 10 mL

## 2023-05-12 NOTE — Progress Notes (Signed)
Hematology and Oncology Follow Up Visit  Rudolphe Testerman [id number] [birth year] 57 y.o. 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 twice daily-start on 12/06/2022 Status post radiosurgery-completed on 02/11/2023 Durvalumab 1500 mg IV monthly-s/p cycle #3-- start on 02/17/2023     Interim History:  Mr. Gotz is back for follow-up.  Thankfully, he is back at work.  I am happy about that.  Even better is the fact that he is not wearing oxygen.  His oxygen levels have been doing quite well.  He did have a CT scan done for follow-up over the pulmonary embolism.  This was done on 04/23/2023.  Thankfully, this did not show any pulmonary embolism.  Even better is felt that his pulmonary nodules are shrinking.  He did have some abdominal pain recently.  He went to the ER.  He had a CT of the abdomen and pelvis.  This showed that he had epiploic appendagitis.  I must say have the I am not heard of this.  It sounds like similar to diverticulitis.  He has had no rashes.  Has had no leg swelling.  He has had no urinary difficulties.  I am so happy that he is doing well.  Overall, his performance status is ECOG 1.   Medications:  Current Outpatient Medications:    albuterol (PROVENTIL) (2.5 MG/3ML) 0.083% nebulizer solution, Take 3 mLs (2.5 mg total) by nebulization every 6 (six) hours as needed for wheezing or shortness of breath., Disp: 75 mL, Rfl: 5   albuterol (VENTOLIN HFA) 108 (90 Base) MCG/ACT inhaler, Inhale 2 puffs into the lungs every 6 (six) hours as needed for wheezing., Disp: 2 each, Rfl: 11   apixaban (ELIQUIS) 5 MG TABS tablet, Take 1 tablet (5 mg total) by mouth 2 (two) times daily., Disp: 60 tablet, Rfl: 12   atorvastatin (LIPITOR) 20 MG tablet, TAKE ONE  TABLET BY MOUTH DAILY, Disp: 90 tablet, Rfl: 3   benzonatate (TESSALON) 200 MG capsule, Take 1 capsule (200 mg total) by mouth 3 (three) times daily as needed for cough., Disp: 30 capsule, Rfl: 1   Budeson-Glycopyrrol-Formoterol (BREZTRI AEROSPHERE) 160-9-4.8 MCG/ACT AERO, Inhale 2 puffs into the lungs in the morning and at bedtime., Disp: 1 each, Rfl: 11   COENZYME Q10 PO, Take 100 mg by mouth in the morning., Disp: , Rfl:    gabapentin (NEURONTIN) 300 MG capsule, TAKE 1 CAPSULE BY MOUTH EVERY NIGHT AT BEDTIME FOR 3 NIGHTS, THEN INCREASE TO TAKE 1 CAPSULE BY MOUTH 2 TIMES A DAY FOR 1 WEEK, THEN OK TO INCREASE TO TAKE 1 CAPSULE BY MOUTH 3 TIMES A DAY, Disp: 90 capsule, Rfl: 0   GUAIFENESIN 1200 PO, Take 1,200 mg by mouth in the morning and at bedtime., Disp: , Rfl:    lidocaine-prilocaine (EMLA) cream, Apply a dime size to port-a-cath 1-2 hours prior to access. Cover with saran wrap., Disp: 30 g, Rfl: 2   Multiple Vitamin (MULTIVITAMIN) capsule, Take 1 capsule by mouth daily., Disp: , Rfl:    telmisartan (MICARDIS) 40 MG tablet, TAKE 1 TABLET BY MOUTH DAILY, Disp: 30 tablet, Rfl: 4  Allergies: No Known Allergies  Past Medical History, Surgical history, Social history, and Family History were reviewed and updated.  Review of Systems: Review of Systems  Constitutional:  Negative.   HENT:  Negative.    Eyes: Negative.   Respiratory:  Positive for cough.   Cardiovascular: Negative.   Gastrointestinal: Negative.   Endocrine: Negative.   Genitourinary: Negative.    Musculoskeletal: Negative.   Skin: Negative.   Neurological: Negative.   Hematological: Negative.   Psychiatric/Behavioral: Negative.      Physical Exam: His vital signs are temperature 98.5.  Pulse 94.  Blood pressure 114/78.  Weight is 177 pounds. .     Wt Readings from Last 3 Encounters:  05/12/23 177 lb (80.3 kg)  04/29/23 180 lb 8.9 oz (81.9 kg)  04/21/23 179 lb 0.2 oz (81.2 kg)    Physical Exam Vitals reviewed.   HENT:     Head: Normocephalic and atraumatic.  Eyes:     Pupils: Pupils are equal, round, and reactive to light.  Cardiovascular:     Rate and Rhythm: Normal rate and regular rhythm.     Heart sounds: Normal heart sounds.  Pulmonary:     Effort: Pulmonary effort is normal.     Breath sounds: Normal breath sounds.  Abdominal:     General: Bowel sounds are normal.     Palpations: Abdomen is soft.  Musculoskeletal:        General: No tenderness or deformity. Normal range of motion.     Cervical back: Normal range of motion.  Lymphadenopathy:     Cervical: No cervical adenopathy.  Skin:    General: Skin is warm and dry.     Findings: No erythema or rash.  Neurological:     Mental Status: He is alert and oriented to person, place, and time.  Psychiatric:        Behavior: Behavior normal.        Thought Content: Thought content normal.        Judgment: Judgment normal.     Lab Results  Component Value Date   WBC 7.8 05/12/2023   HGB 14.0 05/12/2023   HCT 41.9 05/12/2023   MCV 86.0 05/12/2023   PLT 244 05/12/2023     Chemistry      Component Value Date/Time   NA 137 05/12/2023 0950   NA 138 04/16/2023 1109   K 3.9 05/12/2023 0950   CL 101 05/12/2023 0950   CO2 27 05/12/2023 0950   BUN 13 05/12/2023 0950   BUN 11 04/16/2023 1109   CREATININE 0.97 05/12/2023 0950   CREATININE 1.05 01/11/2022 0000      Component Value Date/Time   CALCIUM 9.3 05/12/2023 0950   ALKPHOS 57 05/12/2023 0950   AST 17 05/12/2023 0950   ALT 24 05/12/2023 0950   BILITOT 0.3 05/12/2023 0950      Impression and Plan: Mr. Rendina is a very nice 57 year old white male.  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  Again, he does not wish to have surgery.  He has seen Dr. Dorris Fetch of thoracic surgery.  He completed radiation/chemotherapy.  He has responded but still, I think, there is residual disease.  He has had radiosurgery.  I will now have him on immunotherapy.  I  think that he is doing well with the immunotherapy.  We will continue him on immunotherapy for a year.  I want to cut his Eliquis dose down to 2.5 mg p.o. twice daily.  I think this is reasonable.  He still has about half a bottle of 5 mg strength left.  He will finish this.  He will not need another  PET scan probably until December.  I think with the recent CT angiogram that he had, we are seeing some results.  I will plan to get him back to see me in another month.    [name], MD 10/14/202410:37 AM

## 2023-05-12 NOTE — Addendum Note (Signed)
Addended by: Arlan Organ R on: 05/12/2023 11:08 AM   Modules accepted: Orders

## 2023-05-12 NOTE — Patient Instructions (Signed)
Implanted [facility] Guide An implanted port is a device that is placed under the skin. It is usually placed in the chest. The device may vary based on the need. Implanted ports can be used to give IV medicine, to take blood, or to give fluids. You may have an implanted port if: You need IV medicine that would be irritating to the small veins in your hands or arms. You need IV medicines, such as chemotherapy, for a long period of time. You need IV nutrition for a long period of time. You may have fewer limitations when using a port than you would if you used other types of long-term IVs. You will also likely be able to return to normal activities after your incision heals. An implanted port has two main parts: Reservoir. The reservoir is the part where a needle is inserted to give medicines or draw blood. The reservoir is round. After the port is placed, it appears as a small, raised area under your skin. Catheter. The catheter is a small, thin tube that connects the reservoir to a vein. Medicine that is inserted into the reservoir goes into the catheter and then into the vein. How is my port accessed? To access your port: A numbing cream may be placed on the skin over the port site. Your health care provider will put on a mask and sterile gloves. The skin over your port will be cleaned carefully with a germ-killing soap and allowed to dry. Your health care provider will gently pinch the port and insert a needle into it. Your health care provider will check for a blood return to make sure the port is in the vein and is still working (patent). If your port needs to remain accessed to get medicine continuously (constant infusion), your health care provider will place a clear bandage (dressing) over the needle site. The dressing and needle will need to be changed every week, or as told by your health care provider. What is flushing? Flushing helps keep the port working. Follow instructions from your  health care provider about how and when to flush the port. Ports are usually flushed with saline solution or a medicine called heparin. The need for flushing will depend on how the port is used: If the port is only used from time to time to give medicines or draw blood, the port may need to be flushed: Before and after medicines have been given. Before and after blood has been drawn. As part of routine maintenance. Flushing may be recommended every 4-6 weeks. If a constant infusion is running, the port may not need to be flushed. Throw away any syringes in a disposal container that is meant for sharp items (sharps container). You can buy a sharps container from a pharmacy, or you can make one by using an empty hard plastic bottle with a cover. How long will my port stay implanted? The port can stay in for as long as your health care provider thinks it is needed. When it is time for the port to come out, a surgery will be done to remove it. The surgery will be similar to the procedure that was done to put the port in. Follow these instructions at home: Caring for your port and port site Flush your port as told by your health care provider. If you need an infusion over several days, follow instructions from your health care provider about how to take care of your port site. Make sure you: Change your  dressing as told by your health care provider. Wash your hands with soap and water for at least 20 seconds before and after you change your dressing. If soap and water are not available, use alcohol-based hand sanitizer. Place any used dressings or infusion bags into a plastic bag. Throw that bag in the trash. Keep the dressing that covers the needle clean and dry. Do not get it wet. Do not use scissors or sharp objects near the infusion tubing. Keep any external tubes clamped, unless they are being used. Check your port site every day for signs of infection. Check for: Redness, swelling, or  pain. Fluid or blood. Warmth. Pus or a bad smell. Protect the skin around the port site. Avoid wearing bra straps that rub or irritate the site. Protect the skin around your port from seat belts. Place a soft pad over your chest if needed. Bathe or shower as told by your health care provider. The site may get wet as long as you are not actively receiving an infusion. General instructions  Return to your normal activities as told by your health care provider. Ask your health care provider what activities are safe for you. Carry a medical alert card or wear a medical alert bracelet at all times. This will let health care providers know that you have an implanted port in case of an emergency. Where to find more information American Cancer Society: www.cancer.org American Society of Clinical Oncology: www.cancer.net Contact a health care provider if: You have a fever or chills. You have redness, swelling, or pain at the port site. You have fluid or blood coming from your port site. Your incision feels warm to the touch. You have pus or a bad smell coming from the port site. Summary Implanted ports are usually placed in the chest for long-term IV access. Follow instructions from your health care provider about flushing the port and changing bandages (dressings). Take care of the area around your port by avoiding clothing that puts pressure on the area, and by watching for signs of infection. Protect the skin around your port from seat belts. Place a soft pad over your chest if needed. Contact a health care provider if you have a fever or you have redness, swelling, pain, fluid, or a bad smell at the port site. This information is not intended to replace advice given to you by your health care provider. Make sure you discuss any questions you have with your health care provider. Document Revised: 01/16/2021 Document Reviewed: 01/16/2021 Elsevier Patient Education  2024 [employer].

## 2023-05-12 NOTE — Patient Instructions (Signed)
[facility]  Discharge Instructions: Thank you for choosing [facility] to provide your oncology and hematology care.   If you have a lab appointment with the Cancer Center, please go directly to the Cancer Center and check in at the registration area.  Wear comfortable clothing and clothing appropriate for easy access to any Portacath or PICC line.   We strive to give you quality time with your provider. You may need to reschedule your appointment if you arrive late (15 or more minutes).  Arriving late affects you and other patients whose appointments are after yours.  Also, if you miss three or more appointments without notifying the office, you may be dismissed from the clinic at the provider's discretion.      For prescription refill requests, have your pharmacy contact our office and allow 72 hours for refills to be completed.    Today you received the following chemotherapy and/or immunotherapy agents Imfinzi      To help prevent nausea and vomiting after your treatment, we encourage you to take your nausea medication as directed.  BELOW ARE SYMPTOMS THAT SHOULD BE REPORTED IMMEDIATELY: *FEVER GREATER THAN 100.4 F (38 C) OR HIGHER *CHILLS OR SWEATING *NAUSEA AND VOMITING THAT IS NOT CONTROLLED WITH YOUR NAUSEA MEDICATION *UNUSUAL SHORTNESS OF BREATH *UNUSUAL BRUISING OR BLEEDING *URINARY PROBLEMS (pain or burning when urinating, or frequent urination) *BOWEL PROBLEMS (unusual diarrhea, constipation, pain near the anus) TENDERNESS IN MOUTH AND THROAT WITH OR WITHOUT PRESENCE OF ULCERS (sore throat, sores in mouth, or a toothache) UNUSUAL RASH, SWELLING OR PAIN  UNUSUAL VAGINAL DISCHARGE OR ITCHING   Items with * indicate a potential emergency and should be followed up as soon as possible or go to the Emergency Department if any problems should occur.  Please show the CHEMOTHERAPY ALERT CARD or IMMUNOTHERAPY ALERT CARD at check-in  to the Emergency Department and triage nurse. Should you have questions after your visit or need to cancel or reschedule your appointment, please contact [facility] [place]  [phone] and follow the prompts.  Office hours are 8:00 a.m. to 4:30 p.m. Monday - Friday. Please note that voicemails left after 4:00 p.m. may not be returned until the following business day.  We are closed weekends and major holidays. You have access to a nurse at all times for urgent questions. Please call the main number to the clinic [phone] and follow the prompts.  For any non-urgent questions, you may also contact your provider using MyChart. We now offer e-Visits for anyone 48 and older to request care online for non-urgent symptoms. For details visit [email or web address].   Also download the MyChart app! Go to the app store, search "MyChart", open the app, select [place], and log in with your MyChart username and password.

## 2023-05-13 ENCOUNTER — Encounter (HOSPITAL_COMMUNITY)
Admission: RE | Admit: 2023-05-13 | Discharge: 2023-05-13 | Disposition: A | Discharge status: Home / Self Care | Payer: BC Managed Care – PPO | Source: Ambulatory Visit | Attending: Internal Medicine | Admitting: Internal Medicine

## 2023-05-13 ENCOUNTER — Encounter: Payer: Self-pay | Admitting: *Deleted

## 2023-05-13 VITALS — Wt 179.2 lb

## 2023-05-13 DIAGNOSIS — J449 Chronic obstructive pulmonary disease, unspecified: Secondary | ICD-10-CM

## 2023-05-13 NOTE — Progress Notes (Signed)
Daily Session Note  Patient Details  Name: Brandon Phillips. MRN: [id number] Date of Birth: [birth year] Referring Provider:   Doristine Devoid Pulmonary Rehab Walk Test from 04/21/2023 in [facility]  Referring Provider Ramaswamy       Encounter Date: 05/13/2023  Check In:  Session Check In - 05/13/23 1427       Check-In   Supervising physician immediately available to respond to emergencies [facility] MD immediately available    Physician(s) Jari Favre, NP    Location [facility]    Staff Present Elissa Lovett BS, ACSM-CEP, Exercise Physiologist;Emet Rafanan Synthia Innocent, RN, BSN;Samantha Belarus, RD, LDN    Virtual Visit No    Medication changes reported     No    Fall or balance concerns reported    No    Tobacco Cessation No Change    Warm-up and Cool-down Performed as group-led instruction    Resistance Training Performed Yes    VAD Patient? No    PAD/SET Patient? No      Pain Assessment   Currently in Pain? No/denies    Multiple Pain Sites No             Capillary Blood Glucose: No results found for this or any previous visit (from the past 24 hour(s)).   Exercise Prescription Changes - 05/13/23 1500       Response to Exercise   Blood Pressure (Admit) 120/60    Blood Pressure (Exercise) 154/72    Blood Pressure (Exit) 112/68    Heart Rate (Admit) 107 bpm    Heart Rate (Exercise) 116 bpm    Heart Rate (Exit) 103 bpm    Oxygen Saturation (Admit) 94 %    Oxygen Saturation (Exercise) 93 %    Oxygen Saturation (Exit) 93 %    Rating of Perceived Exertion (Exercise) 13    Perceived Dyspnea (Exercise) 2    Duration Continue with 30 min of aerobic exercise without signs/symptoms of physical distress.    Intensity THRR unchanged      Progression   Progression Continue to progress workloads to maintain intensity without signs/symptoms of physical distress.      Resistance Training    Training Prescription Yes    Weight black bands    Reps 10-15    Time 10 Minutes      Oxygen   Oxygen Continuous    Liters 2-3      Recumbant Elliptical   Level 4    Minutes 15    METs 3.2      Rower   Level 3    Watts 25    Minutes 15      Oxygen   Maintain Oxygen Saturation 88% or higher             Social History   Tobacco Use  Smoking Status Former   Current packs/day: 0.00   Average packs/day: 1.5 packs/day for 35.0 years (52.5 ttl pk-yrs)   Types: Cigarettes   Start date: 03/30/1982   Quit date: 03/30/2017   Years since quitting: 6.1  Smokeless Tobacco Never    Goals Met:  Proper associated with RPD/PD & O2 Sat Independence with exercise equipment Exercise tolerated well No report of concerns or symptoms today Strength training completed today  Goals Unmet:  Not Applicable  Comments: Service time is from 1314 to 1440.  Dr. Mechele Collin is Medical Director for Pulmonary Rehab at [facility].

## 2023-05-13 NOTE — Progress Notes (Unsigned)
Continued on planned immunotherapy. No additional needs.   Oncology Nurse Navigator Documentation     05/13/2023    9:00 AM  Oncology Nurse Navigator Flowsheets  Navigator Follow Up Date: 06/09/2023  Navigator Follow Up Reason: Follow-up Appointment;Chemotherapy  Navigator Location [facility]  Navigator Encounter Type Appt/Treatment Plan Review  Patient Visit Type MedOnc  Treatment Phase Active Tx  Barriers/Navigation Needs No Barriers At This Time  Interventions None Required  Acuity Level 1-No Barriers  Support Groups/Services Friends and Family  Time Spent with Patient 15

## 2023-05-14 ENCOUNTER — Other Ambulatory Visit: Payer: Self-pay

## 2023-05-14 ENCOUNTER — Encounter: Payer: Self-pay | Admitting: Hematology & Oncology

## 2023-05-15 ENCOUNTER — Encounter (HOSPITAL_COMMUNITY)
Admission: RE | Admit: 2023-05-15 | Discharge: 2023-05-15 | Disposition: A | Discharge status: Home / Self Care | Payer: BC Managed Care – PPO | Source: Ambulatory Visit | Attending: Internal Medicine | Admitting: Internal Medicine

## 2023-05-15 DIAGNOSIS — J449 Chronic obstructive pulmonary disease, unspecified: Secondary | ICD-10-CM | POA: Diagnosis not present

## 2023-05-20 ENCOUNTER — Encounter (HOSPITAL_COMMUNITY)
Admission: RE | Admit: 2023-05-20 | Discharge: 2023-05-20 | Disposition: A | Discharge status: Home / Self Care | Payer: BC Managed Care – PPO | Source: Ambulatory Visit | Attending: Internal Medicine | Admitting: Internal Medicine

## 2023-05-20 DIAGNOSIS — R490 Dysphonia: Secondary | ICD-10-CM | POA: Diagnosis not present

## 2023-05-20 DIAGNOSIS — E785 Hyperlipidemia, unspecified: Secondary | ICD-10-CM | POA: Diagnosis not present

## 2023-05-20 DIAGNOSIS — J449 Chronic obstructive pulmonary disease, unspecified: Secondary | ICD-10-CM

## 2023-05-20 NOTE — Progress Notes (Signed)
Daily Session Note  Patient Details  Name: Brandon Phillips. MRN: [id number] Date of Birth: September 18, 1965 Referring Provider:   Doristine Devoid Pulmonary Rehab Walk Test from 04/21/2023 in [facility]  Referring Provider Ramaswamy       Encounter Date: 05/20/2023  Check In:  Session Check In - 05/20/23 1334       Check-In   Supervising physician immediately available to respond to emergencies [facility] immediately available    Physician(s) Edd Fabian, NP    [facility]    Staff Present Elissa Lovett BS, ACSM-CEP, Exercise Physiologist;Casey Synthia Innocent, RN, BSN;Samantha Belarus, RD, Rexene Agent, MS, ACSM-CEP, Exercise Physiologist    Virtual Visit No    Medication changes reported     No    Fall or balance concerns reported    No    Tobacco Cessation No Change    Warm-up and Cool-down Performed as group-led instruction    Resistance Training Performed Yes    VAD Patient? No    PAD/SET Patient? No      Pain Assessment   Currently in Pain? No/denies    Multiple Pain Sites No             Capillary Blood Glucose: No results found for this or any previous visit (from the past 24 hour(s)).    Social History   Tobacco Use  Smoking Status Former   Current packs/day: 0.00   Average packs/day: 1.5 packs/day for 35.0 years (52.5 ttl pk-yrs)   Types: Cigarettes   Start date: 03/30/1982   Quit date: 03/30/2017   Years since quitting: 6.1  Smokeless Tobacco Never    Goals Met:  Proper associated with RPD/PD & O2 Sat Independence with exercise equipment Exercise tolerated well No report of concerns or symptoms today Strength training completed today  Goals Unmet:  Not Applicable  Comments: Service time is from 1319 to 1445.  Dr. Mechele Collin is Medical Director for Pulmonary Rehab at [facility].

## 2023-05-22 ENCOUNTER — Encounter (HOSPITAL_COMMUNITY): Admission: RE | Admit: 2023-05-22 | Payer: BC Managed Care – PPO | Source: Ambulatory Visit

## 2023-05-24 ENCOUNTER — Other Ambulatory Visit: Payer: Self-pay | Admitting: Medical-Surgical

## 2023-05-27 ENCOUNTER — Encounter (HOSPITAL_COMMUNITY)
Admission: RE | Admit: 2023-05-27 | Discharge: 2023-05-27 | Disposition: A | Discharge status: Home / Self Care | Payer: BC Managed Care – PPO | Source: Ambulatory Visit | Attending: Internal Medicine

## 2023-05-27 ENCOUNTER — Encounter: Payer: Self-pay | Admitting: Hematology & Oncology

## 2023-05-27 VITALS — Wt 178.1 lb

## 2023-05-27 DIAGNOSIS — E785 Hyperlipidemia, unspecified: Secondary | ICD-10-CM | POA: Diagnosis not present

## 2023-05-27 DIAGNOSIS — J449 Chronic obstructive pulmonary disease, unspecified: Secondary | ICD-10-CM

## 2023-05-27 DIAGNOSIS — R490 Dysphonia: Secondary | ICD-10-CM | POA: Diagnosis not present

## 2023-05-27 NOTE — Progress Notes (Signed)
Daily Session Note  Patient Details  Name: Brandon Phillips. MRN: [id number] Date of Birth: 09-27-1965 Referring Provider:   Doristine Devoid Pulmonary Rehab Walk Test from 04/21/2023 in [facility]  Referring Provider Ramaswamy       Encounter Date: 05/27/2023  Check In:  Session Check In - 05/27/23 1426       Check-In   Supervising physician immediately available to respond to emergencies [facility] MD immediately available    Physician(s) Robin Searing, NP    [facility]    Staff Present Durel Salts, Patriciaann Clan, RN, BSN;Samantha Belarus, RD, Rexene Agent, MS, ACSM-CEP, Exercise Physiologist;Johnny Hale Bogus, MS, Exercise Physiologist    Virtual Visit No    Medication changes reported     No    Fall or balance concerns reported    No    Tobacco Cessation No Change    Warm-up and Cool-down Performed as group-led instruction    Resistance Training Performed Yes    VAD Patient? No    PAD/SET Patient? No      Pain Assessment   Currently in Pain? No/denies    Pain Score 0-No pain    Multiple Pain Sites No             Capillary Blood Glucose: No results found for this or any previous visit (from the past 24 hour(s)).   Exercise Prescription Changes - 05/27/23 1500       Response to Exercise   Blood Pressure (Admit) 98/58    Blood Pressure (Exercise) 140/58    Blood Pressure (Exit) 104/66    Heart Rate (Admit) 102 bpm    Heart Rate (Exercise) 116 bpm    Heart Rate (Exit) 102 bpm    Oxygen Saturation (Admit) 93 %    Oxygen Saturation (Exercise) 90 %    Oxygen Saturation (Exit) 94 %    Rating of Perceived Exertion (Exercise) 13    Perceived Dyspnea (Exercise) 1    Duration Continue with 30 min of aerobic exercise without signs/symptoms of physical distress.    Intensity THRR unchanged      Progression   Progression Continue to progress workloads to maintain intensity without  signs/symptoms of physical distress.      Resistance Training   Training Prescription Yes    Weight black bands    Reps 10-15    Time 10 Minutes      Interval Training   Interval Training No      Oxygen   Oxygen Continuous    Liters 1      Recumbant Elliptical   Level 4    Minutes 15    METs 3.4      Rower   Level 2    Watts 31    Minutes 15      Oxygen   Maintain Oxygen Saturation 88% or higher             Social History   Tobacco Use  Smoking Status Former   Current packs/day: 0.00   Average packs/day: 1.5 packs/day for 35.0 years (52.5 ttl pk-yrs)   Types: Cigarettes   Start date: 03/30/1982   Quit date: 03/30/2017   Years since quitting: 6.1  Smokeless Tobacco Never    Goals Met:  Proper associated with RPD/PD & O2 Sat Independence with exercise equipment Exercise tolerated well No report of concerns or symptoms today Strength training completed today  Goals Unmet:  Not  Applicable  Comments: Service time is from 1315 to 1452.    Dr. Mechele Collin is Medical Director for Pulmonary Rehab at [facility].

## 2023-05-27 NOTE — Progress Notes (Signed)
Daily Session Note  Patient Details  Name: Brandon Phillips. MRN: [id number] Date of Birth: [birth year] Referring Provider:   Doristine Devoid Pulmonary Rehab Walk Test from [facility]/21/2023 in [facility]  Referring Provider Ramaswamy       Encounter Date: 05/27/2023  Check In:  Session Check In - 05/27/23 1426       Check-In   Supervising physician immediately available to respond to emergencies [facility] immediately available    Physician(s) Robin Searing, NP    [facility]    Staff Present Durel Salts, Patriciaann Clan, RN, BSN;Samantha Belarus, RD, Rexene Agent, MS, ACSM-CEP, Exercise Physiologist;Johnny Hale Bogus, MS, Exercise Physiologist    Virtual Visit No    Medication changes reported     No    Fall or balance concerns reported    No    Tobacco Cessation No Change    Warm-up and Cool-down Performed as group-led instruction    Resistance Training Performed Yes    VAD Patient? No    PAD/SET Patient? No      Pain Assessment   Currently in Pain? No/denies    Pain Score 0-No pain    Multiple Pain Sites No             Capillary Blood Glucose: No results found for this or any previous visit (from the past 24 hour(s)).   Exercise Prescription Changes - 05/27/23 1500       Response to Exercise   Blood Pressure (Admit) 98/58    Blood Pressure (Exercise) 140/58    Blood Pressure (Exit) 104/66    Heart Rate (Admit) 102 bpm    Heart Rate (Exercise) 116 bpm    Heart Rate (Exit) 102 bpm    Oxygen Saturation (Admit) 93 %    Oxygen Saturation (Exercise) 90 %    Oxygen Saturation (Exit) 94 %    Rating of Perceived Exertion (Exercise) 13    Perceived Dyspnea (Exercise) 1    Duration Continue with 30 min of aerobic exercise without signs/symptoms of physical distress.    Intensity THRR unchanged      Progression   Progression Continue to progress workloads to maintain intensity without  signs/symptoms of physical distress.      Resistance Training   Training Prescription Yes    Weight black bands    Reps 10-15    Time 10 Minutes      Interval Training   Interval Training No      Oxygen   Oxygen Continuous    Liters 1      Recumbant Elliptical   Level 4    Minutes 15    METs 3.4      Rower   Level 2    Watts 31    Minutes 15      Oxygen   Maintain Oxygen Saturation 88% or higher             Social History   Tobacco Use  Smoking Status Former   Current packs/day: 0.00   Average packs/day: 1.5 packs/day for 35.0 years (52.5 ttl pk-yrs)   Types: Cigarettes   Start date: 03/30/1982   Quit date: 03/30/2017   Years since quitting: 6.1  Smokeless Tobacco Never    Goals Met:  Proper associated with RPD/PD & O2 Sat Independence with exercise equipment Exercise tolerated well No report of concerns or symptoms today Strength training completed today  Goals Unmet:  Not  Applicable  Comments: Service time is from 1307 to 1444.    Dr. Mechele Collin is Medical Director for Pulmonary Rehab at [facility]

## 2023-05-28 NOTE — Progress Notes (Signed)
Pulmonary Individual Treatment Plan  Patient Details  Name: Brandon Phillips. MRN: [id number] Date of Birth: [birth year] Referring Provider:   Doristine Devoid Pulmonary Rehab Walk Test from 04/21/2023 in [facility]  Referring Provider Ramaswamy       Initial Encounter Date:  Flowsheet Row Pulmonary Rehab Walk Test from 04/21/2023 in [facility]  Date 04/21/23       Visit Diagnosis: Stage 3 severe COPD by GOLD classification (HCC)  Patient's Home Medications on Admission:   Current Outpatient Medications:    albuterol (PROVENTIL) (2.5 MG/3ML) 0.083% nebulizer solution, Take 3 mLs (2.5 mg total) by nebulization every 6 (six) hours as needed for wheezing or shortness of breath., Disp: 75 mL, Rfl: 5   albuterol (VENTOLIN HFA) 108 (90 Base) MCG/ACT inhaler, Inhale 2 puffs into the lungs every 6 (six) hours as needed for wheezing., Disp: 2 each, Rfl: 11   apixaban (ELIQUIS) 2.5 MG TABS tablet, Take 1 tablet (2.5 mg total) by mouth 2 (two) times daily., Disp: 60 tablet, Rfl: 12   atorvastatin (LIPITOR) 20 MG tablet, TAKE ONE TABLET BY MOUTH DAILY, Disp: 90 tablet, Rfl: 3   benzonatate (TESSALON) 200 MG capsule, Take 1 capsule (200 mg total) by mouth 3 (three) times daily as needed for cough., Disp: 30 capsule, Rfl: 1   Budeson-Glycopyrrol-Formoterol (BREZTRI AEROSPHERE) 160-9-4.8 MCG/ACT AERO, Inhale 2 puffs into the lungs in the morning and at bedtime., Disp: 1 each, Rfl: 11   COENZYME Q10 PO, Take 100 mg by mouth in the morning., Disp: , Rfl:    gabapentin (NEURONTIN) 300 MG capsule, TAKE 1 CAPSULE BY MOUTH EVERY NIGHT AT BEDTIME FOR 3 NIGHTS, THEN INCREASE TO TAKE 1 CAPSULE BY MOUTH 2 TIMES A DAY FOR 1 WEEK, THEN OK TO INCREASE TO TAKE 1 CAPSULE BY MOUTH 3 TIMES A DAY, Disp: 90 capsule, Rfl: 0   GUAIFENESIN 1200 PO, Take 1,200 mg by mouth in the morning and at bedtime.,  Disp: , Rfl:    lidocaine-prilocaine (EMLA) cream, Apply a dime size to port-a-cath 1-2 hours prior to access. Cover with saran wrap., Disp: 30 g, Rfl: 2   Multiple Vitamin (MULTIVITAMIN) capsule, Take 1 capsule by mouth daily., Disp: , Rfl:    telmisartan (MICARDIS) 40 MG tablet, TAKE 1 TABLET BY MOUTH DAILY, Disp: 30 tablet, Rfl: 4  Past Medical History: Past Medical History:  Diagnosis Date   Cancer (HCC)    lung cancer   CAP (community acquired pneumonia)    COPD (chronic obstructive pulmonary disease) (HCC)    Eczema    HLD (hyperlipidemia)    Primary cancer of left lower lobe of lung (HCC) 09/17/2022   Tobacco use disorder 01/24/2017    Tobacco Use: Social History   Tobacco Use  Smoking Status Former   Current packs/day: 0.00   Average packs/day: 1.5 packs/day for 35.0 years (52.5 ttl pk-yrs)   Types: Cigarettes   Start date: 03/30/1982   Quit date: 03/30/2017   Years since quitting: 6.1  Smokeless Tobacco Never    Labs: Review Flowsheet       Latest Ref Rng & Units 02/10/2020 04/06/2020 01/11/2022  Labs for ITP Cardiac and Pulmonary Rehab  Cholestrol <200 mg/dL 782  956  213   LDL (calc) mg/dL (calc) 086  92  88   HDL-C > OR = 40 mg/dL 50  38  47   Trlycerides <150  mg/dL 161  096  90     Details            Capillary Blood Glucose: Lab Results  Component Value Date   GLUCAP 95 01/01/2023   GLUCAP 113 (H) 08/14/2022     Pulmonary Assessment Scores:  Pulmonary Assessment Scores     Row Name 04/21/23 1328         ADL UCSD   ADL Phase Entry     SOB Score total 40       CAT Score   CAT Score 17       mMRC Score   mMRC Score 2             UCSD: Self-administered rating of dyspnea associated with activities of daily living (ADLs) 6-point scale (0 = "not at all" to 5 = "maximal or unable to do because of breathlessness")  Scoring Scores range from 0 to 120.  Minimally important difference is 5 units  CAT: CAT can identify the health  impairment of COPD patients and is better correlated with disease progression.  CAT has a scoring range of zero to 40. The CAT score is classified into four groups of low (less than 10), medium (10 - 20), high (21-30) and very high (31-40) based on the impact level of disease on health status. A CAT score over 10 suggests significant symptoms.  A worsening CAT score could be explained by an exacerbation, poor medication adherence, poor inhaler technique, or progression of COPD or comorbid conditions.  CAT MCID is 2 points  mMRC: mMRC (Modified Medical Research Council) Dyspnea Scale is used to assess the degree of baseline functional disability in patients of respiratory disease due to dyspnea. No minimal important difference is established. A decrease in score of 1 point or greater is considered a positive change.   Pulmonary Function Assessment:  Pulmonary Function Assessment - 04/21/23 1338       Breath   Shortness of Breath No             Exercise Target Goals: Exercise Program Goal: Individual exercise prescription set using results from initial 6 min walk test and THRR while considering  patient's activity barriers and safety.   Exercise Prescription Goal: Initial exercise prescription builds to 30-45 minutes a day of aerobic activity, 2-3 days per week.  Home exercise guidelines will be given to patient during program as part of exercise prescription that the participant will acknowledge.  Activity Barriers & Risk Stratification:  Activity Barriers & Cardiac Risk Stratification - 04/21/23 1338       Activity Barriers & Cardiac Risk Stratification   Activity Barriers Shortness of Breath;Muscular Weakness;Deconditioning    Cardiac Risk Stratification Moderate             6 Minute Walk:  6 Minute Walk     Row Name 04/21/23 1432         6 Minute Walk   Phase Initial     Distance 1060 feet     Walk Time 6 minutes     # of Rest Breaks 0     MPH 2.01     METS  3.03     RPE 11     Perceived Dyspnea  1     VO2 Peak 10.59     Symptoms No     Resting HR 86 bpm     Resting BP 122/76     Resting Oxygen Saturation  90 %     Exercise  Oxygen Saturation  during 6 min walk 86 %     Max Ex. HR 99 bpm     Max Ex. BP 108/60     2 Minute Post BP 106/70       Interval HR   1 Minute HR 94     2 Minute HR 96     3 Minute HR 96     4 Minute HR 97     5 Minute HR 96     6 Minute HR 99     2 Minute Post HR 97     Interval Heart Rate? Yes       Interval Oxygen   Interval Oxygen? Yes     Baseline Oxygen Saturation % 90 %     1 Minute Oxygen Saturation % 90 %     1 Minute Liters of Oxygen 0 L     2 Minute Oxygen Saturation % 87 %  lowest 86 RA, applied 1L     2 Minute Liters of Oxygen 0 L     3 Minute Oxygen Saturation % 88 %     3 Minute Liters of Oxygen 1 L     4 Minute Oxygen Saturation % 89 %     4 Minute Liters of Oxygen 1 L     5 Minute Oxygen Saturation % 89 %     5 Minute Liters of Oxygen 1 L     6 Minute Oxygen Saturation % 90 %     6 Minute Liters of Oxygen 1 L     2 Minute Post Oxygen Saturation % 96 %     2 Minute Post Liters of Oxygen 1 L              Oxygen Initial Assessment:  Oxygen Initial Assessment - 04/21/23 1329       Home Oxygen   Home Oxygen Device Home Concentrator;Portable Concentrator    Sleep Oxygen Prescription Continuous    Liters per minute 2.5    Home Exercise Oxygen Prescription Pulsed    Liters per minute 3    Home Resting Oxygen Prescription None    Compliance with Home Oxygen Use Yes      Initial 6 min Walk   Oxygen Used Continuous    Liters per minute 1      Program Oxygen Prescription   Program Oxygen Prescription Continuous    Liters per minute 1      Intervention   Short Term Goals To learn and exhibit compliance with exercise, home and travel O2 prescription;To learn and understand importance of maintaining oxygen saturations>88%;To learn and demonstrate proper use of respiratory  medications;To learn and understand importance of monitoring SPO2 with pulse oximeter and demonstrate accurate use of the pulse oximeter.;To learn and demonstrate proper pursed lip breathing techniques or other breathing techniques. ;Other    Long  Term Goals Other;Demonstrates proper use of MDI's;Compliance with respiratory medication;Exhibits proper breathing techniques, such as pursed lip breathing or other method taught during program session;Maintenance of O2 saturations>88%;Verbalizes importance of monitoring SPO2 with pulse oximeter and return demonstration;Exhibits compliance with exercise, home  and travel O2 prescription             Oxygen Re-Evaluation:  Oxygen Re-Evaluation     Row Name 04/22/23 0839 05/26/23 0949           Program Oxygen Prescription   Program Oxygen Prescription Continuous Continuous      Liters per minute 1 1  Comments pt to begin exercise 10/1 --        Home Oxygen   Home Oxygen Device Home Concentrator;Portable Concentrator Home Concentrator;Portable Concentrator      Sleep Oxygen Prescription Continuous Continuous      Liters per minute 2.5 2.5      Home Exercise Oxygen Prescription Pulsed Pulsed      Liters per minute 3 3      Home Resting Oxygen Prescription None None      Compliance with Home Oxygen Use Yes Yes        Goals/Expected Outcomes   Short Term Goals To learn and exhibit compliance with exercise, home and travel O2 prescription;To learn and understand importance of maintaining oxygen saturations>88%;To learn and demonstrate proper use of respiratory medications;To learn and understand importance of monitoring SPO2 with pulse oximeter and demonstrate accurate use of the pulse oximeter.;To learn and demonstrate proper pursed lip breathing techniques or other breathing techniques. ;Other To learn and exhibit compliance with exercise, home and travel O2 prescription;To learn and understand importance of maintaining oxygen  saturations>88%;To learn and demonstrate proper use of respiratory medications;To learn and understand importance of monitoring SPO2 with pulse oximeter and demonstrate accurate use of the pulse oximeter.;To learn and demonstrate proper pursed lip breathing techniques or other breathing techniques. ;Other      Long  Term Goals Other;Demonstrates proper use of MDI's;Compliance with respiratory medication;Exhibits proper breathing techniques, such as pursed lip breathing or other method taught during program session;Maintenance of O2 saturations>88%;Verbalizes importance of monitoring SPO2 with pulse oximeter and return demonstration;Exhibits compliance with exercise, home  and travel O2 prescription Other;Demonstrates proper use of MDI's;Compliance with respiratory medication;Exhibits proper breathing techniques, such as pursed lip breathing or other method taught during program session;Maintenance of O2 saturations>88%;Verbalizes importance of monitoring SPO2 with pulse oximeter and return demonstration;Exhibits compliance with exercise, home  and travel O2 prescription      Goals/Expected Outcomes Compliance and understanding of oxygen saturation monitoring and breathing techniques to decrease shortness of breath Compliance and understanding of oxygen saturation monitoring and breathing techniques to decrease shortness of breath               Oxygen Discharge (Final Oxygen Re-Evaluation):  Oxygen Re-Evaluation - 05/26/23 0949       Program Oxygen Prescription   Program Oxygen Prescription Continuous    Liters per minute 1      Home Oxygen   Home Oxygen Device Home Concentrator;Portable Concentrator    Sleep Oxygen Prescription Continuous    Liters per minute 2.5    Home Exercise Oxygen Prescription Pulsed    Liters per minute 3    Home Resting Oxygen Prescription None    Compliance with Home Oxygen Use Yes      Goals/Expected Outcomes   Short Term Goals To learn and exhibit compliance  with exercise, home and travel O2 prescription;To learn and understand importance of maintaining oxygen saturations>88%;To learn and demonstrate proper use of respiratory medications;To learn and understand importance of monitoring SPO2 with pulse oximeter and demonstrate accurate use of the pulse oximeter.;To learn and demonstrate proper pursed lip breathing techniques or other breathing techniques. ;Other    Long  Term Goals Other;Demonstrates proper use of MDI's;Compliance with respiratory medication;Exhibits proper breathing techniques, such as pursed lip breathing or other method taught during program session;Maintenance of O2 saturations>88%;Verbalizes importance of monitoring SPO2 with pulse oximeter and return demonstration;Exhibits compliance with exercise, home  and travel O2 prescription    Goals/Expected Outcomes Compliance and understanding  of oxygen saturation monitoring and breathing techniques to decrease shortness of breath             Initial Exercise Prescription:  Initial Exercise Prescription - 04/21/23 1400       Date of Initial Exercise RX and Referring Provider   Date 04/21/23    Referring Provider Ramaswamy    Expected Discharge Date 04/21/23      Oxygen   Oxygen Continuous    Liters 1    Maintain Oxygen Saturation 88% or higher      Treadmill   MPH 2    Grade 0    Minutes 15    METs 3      Recumbant Elliptical   Level 3    RPM 70    Minutes 15    METs 3      Prescription Details   Frequency (times per week) 2    Duration Progress to 30 minutes of continuous aerobic without signs/symptoms of physical distress      Intensity   THRR 40-80% of Max Heartrate 66-132    Ratings of Perceived Exertion 11-13    Perceived Dyspnea 0-4      Progression   Progression Continue to progress workloads to maintain intensity without signs/symptoms of physical distress.      Resistance Training   Training Prescription Yes    Weight blue bands    Reps 10-15              Perform Capillary Blood Glucose checks as needed.  Exercise Prescription Changes:   Exercise Prescription Changes     Row Name 04/29/23 1500 05/13/23 1500 05/27/23 1500         Response to Exercise   Blood Pressure (Admit) 102/62 120/60 98/58     Blood Pressure (Exercise) 144/88 154/72 140/58     Blood Pressure (Exit) 132/64 112/68 104/66     Heart Rate (Admit) 108 bpm 107 bpm 102 bpm     Heart Rate (Exercise) 127 bpm 116 bpm 116 bpm     Heart Rate (Exit) 118 bpm 103 bpm 102 bpm     Oxygen Saturation (Admit) 89 % 94 % 93 %     Oxygen Saturation (Exercise) 88 % 93 % 90 %     Oxygen Saturation (Exit) 93 % 93 % 94 %     Rating of Perceived Exertion (Exercise) 14 13 13      Perceived Dyspnea (Exercise) 2 2 1      Duration Continue with 30 min of aerobic exercise without signs/symptoms of physical distress. Continue with 30 min of aerobic exercise without signs/symptoms of physical distress. Continue with 30 min of aerobic exercise without signs/symptoms of physical distress.     Intensity THRR unchanged THRR unchanged THRR unchanged       Progression   Progression Continue to progress workloads to maintain intensity without signs/symptoms of physical distress. Continue to progress workloads to maintain intensity without signs/symptoms of physical distress. Continue to progress workloads to maintain intensity without signs/symptoms of physical distress.       Resistance Training   Training Prescription Yes Yes Yes     Weight black bands black bands black bands     Reps 10-15 10-15 10-15     Time 10 Minutes 10 Minutes 10 Minutes       Interval Training   Interval Training -- -- No       Oxygen   Oxygen Continuous Continuous Continuous     Liters 3 2-3  1       Treadmill   MPH 2 -- --     Grade 0 -- --     Minutes 15 -- --     METs 2.4 -- --       Recumbant Elliptical   Level 3 4 4      RPM 70 -- --     Minutes 15 15 15      METs 2 3.2 3.4       Rower    Level -- 3 2     Watts -- 25 31     Minutes -- 15 15       Oxygen   Maintain Oxygen Saturation -- 88% or higher 88% or higher              Exercise Comments:   Exercise Comments     Row Name 04/29/23 1512           Exercise Comments Pt completed first day of group exercise. He exercised on the recumbent elliptical for 15 min, level 3, METs 2.0. He then walked on the treadmill for 15 min, 2.0 mph, 0 incline, METs 2.4. Pt tolerated exercise well and performed warm up and cool down unlimited. We discussed METs with good perception.                Exercise Goals and Review:   Exercise Goals     Row Name 04/21/23 1339             Exercise Goals   Increase Physical Activity Yes       Intervention Provide advice, education, support and counseling about physical activity/exercise needs.;Develop an individualized exercise prescription for aerobic and resistive training based on initial evaluation findings, risk stratification, comorbidities and participant's personal goals.       Expected Outcomes Short Term: Attend rehab on a regular basis to increase amount of physical activity.;Long Term: Exercising regularly at least 3-5 days a week.;Long Term: Add in home exercise to make exercise part of routine and to increase amount of physical activity.       Increase Strength and Stamina Yes       Intervention Provide advice, education, support and counseling about physical activity/exercise needs.;Develop an individualized exercise prescription for aerobic and resistive training based on initial evaluation findings, risk stratification, comorbidities and participant's personal goals.       Expected Outcomes Short Term: Increase workloads from initial exercise prescription for resistance, speed, and METs.;Short Term: Perform resistance training exercises routinely during rehab and add in resistance training at home;Long Term: Improve cardiorespiratory fitness, muscular endurance and  strength as measured by increased METs and functional capacity ( )       Able to understand and use rate of perceived exertion (RPE) scale Yes       Intervention Provide education and explanation on how to use RPE scale       Expected Outcomes Short Term: Able to use RPE daily in rehab to express subjective intensity level;Long Term:  Able to use RPE to guide intensity level when exercising independently       Able to understand and use Dyspnea scale Yes       Intervention Provide education and explanation on how to use Dyspnea scale       Expected Outcomes Short Term: Able to use Dyspnea scale daily in rehab to express subjective sense of shortness of breath during exertion;Long Term: Able to use Dyspnea scale to guide intensity level when exercising independently  Knowledge and understanding of Target Heart Rate Range (THRR) Yes       Intervention Provide education and explanation of THRR including how the numbers were predicted and where they are located for reference       Expected Outcomes Short Term: Able to state/look up THRR;Long Term: Able to use THRR to govern intensity when exercising independently;Short Term: Able to use daily as guideline for intensity in rehab       Understanding of Exercise Prescription Yes       Intervention Provide education, explanation, and written materials on patient's individual exercise prescription       Expected Outcomes Short Term: Able to explain program exercise prescription;Long Term: Able to explain home exercise prescription to exercise independently                Exercise Goals Re-Evaluation :  Exercise Goals Re-Evaluation     Row Name 04/22/23 0831 05/26/23 0938           Exercise Goal Re-Evaluation   Exercise Goals Review Increase Physical Activity;Able to understand and use Dyspnea scale;Understanding of Exercise Prescription;Increase Strength and Stamina;Knowledge and understanding of Target Heart Rate Range (THRR);Able to  understand and use rate of perceived exertion (RPE) scale Increase Physical Activity;Able to understand and use Dyspnea scale;Understanding of Exercise Prescription;Increase Strength and Stamina;Knowledge and understanding of Target Heart Rate Range (THRR);Able to understand and use rate of perceived exertion (RPE) scale      Comments Pt to begin exercise 10/1. Will progress as tolerated. Brandon Phillips has completed 7 exercise sessions. He exercises for 15 min on the recumbent elliptical and rower. Brandon Phillips averages 3.4 METs at level 4 on the recumbent elliptical and 29 watts at level 2 on the rower. He performs the warmup and cooldown standing without limitations. Brandon Phillips has been transferred from the treadmill to the rower due to hip pain. He tolerates the rower better. Brandon Phillips has increased his workload for the recumbent elliptical as he tolerates progressions well. Will continue to monitor and progress as able.      Expected Outcomes Through exercise at rehab and home, the patient will decrease shortness of breath with daily activities and feel confident in carrying out an exercise regimen at home Through exercise at rehab and home, the patient will decrease shortness of breath with daily activities and feel confident in carrying out an exercise regimen at home               Discharge Exercise Prescription (Final Exercise Prescription Changes):  Exercise Prescription Changes - 05/27/23 1500       Response to Exercise   Blood Pressure (Admit) 98/58    Blood Pressure (Exercise) 140/58    Blood Pressure (Exit) 104/66    Heart Rate (Admit) 102 bpm    Heart Rate (Exercise) 116 bpm    Heart Rate (Exit) 102 bpm    Oxygen Saturation (Admit) 93 %    Oxygen Saturation (Exercise) 90 %    Oxygen Saturation (Exit) 94 %    Rating of Perceived Exertion (Exercise) 13    Perceived Dyspnea (Exercise) 1    Duration Continue with 30 min of aerobic exercise without signs/symptoms of physical distress.    Intensity THRR  unchanged      Progression   Progression Continue to progress workloads to maintain intensity without signs/symptoms of physical distress.      Resistance Training   Training Prescription Yes    Weight black bands    Reps 10-15  Time 10 Minutes      Interval Training   Interval Training No      Oxygen   Oxygen Continuous    Liters 1      Recumbant Elliptical   Level 4    Minutes 15    METs 3.4      Rower   Level 2    Watts 31    Minutes 15      Oxygen   Maintain Oxygen Saturation 88% or higher             Nutrition:  Target Goals: Understanding of nutrition guidelines, daily intake of sodium 1500mg , cholesterol 200mg , calories 30% from fat and 7% or less from saturated fats, daily to have 5 or more servings of fruits and vegetables.  Biometrics:  Pre Biometrics - 04/29/23 1515       Pre Biometrics   Grip Strength 42 kg              Nutrition Therapy Plan and Nutrition Goals:  Nutrition Therapy & Goals - 05/26/23 1133       Nutrition Therapy   Diet General Healthy Diet    Drug/Food Interactions Statins/Certain Fruits      Personal Nutrition Goals   Nutrition Goal Patient to identify strategies for weight loss of 0.5-2.0# per week.   goal not met.   Personal Goal #2 Patient to improve diet quality by using the plate method as a guide for meal planning to include lean protein/plant protein, fruits, vegetables, whole grains, nonfat dairy as part of a well-balanced diet.   goal not met.   Comments Goal not met. Brandon Phillips reports motivation to lose weight. He is up ~20# since June but he reports weight gain since starting cancer treatment. He is up 1.5# since starting with our program. 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 His wife is a good support. He has returned back to work full time. Patient will benefit from participation in pulmonary rehab for nutrition,  exercise, and lifestyle modification.      Intervention Plan   Intervention Prescribe, educate and counsel regarding individualized specific dietary modifications aiming towards targeted core components such as weight, hypertension, lipid management, diabetes, heart failure and other comorbidities.;Nutrition handout(s) given to patient.    Expected Outcomes Short Term Goal: Understand basic principles of dietary content, such as calories, fat, sodium, cholesterol and nutrients.;Long Term Goal: Adherence to prescribed nutrition plan.             Nutrition Assessments:  Nutrition Assessments - 04/29/23 1509       Rate Your Plate Scores   Pre Score 41            MEDIFICTS Score Key: >=70 Need to make dietary changes  40-70 Heart Healthy Diet <= 40 Therapeutic Level Cholesterol Diet  Flowsheet Row PULMONARY REHAB CHRONIC OBSTRUCTIVE PULMONARY DISEASE from 04/29/2023 in [facility]  Picture Your Plate Total Score on Admission 41      Picture Your Plate Scores: <09 Unhealthy dietary pattern with much room for improvement. 41-50 Dietary pattern unlikely to meet recommendations for good health and room for improvement. 51-60 More healthful dietary pattern, with some room for improvement.  >60 Healthy dietary pattern, although there may be some specific behaviors that could be improved.    Nutrition Goals Re-Evaluation:  Nutrition Goals Re-Evaluation     Row Name 04/29/23  1412 05/26/23 1133           Goals   Current Weight 180 lb 8.9 oz (81.9 kg) 180 lb 8.9 oz (81.9 kg)      Comment lipids WNL, LDL 88, CBC WNL no new labs; most recent labs  lipids WNL, LDL 88, CBC WNL      Expected Outcome Brandon Phillips reports motivation to lose weight. He is up ~20# since June 2024 but he reports weight gain since starting cancer treatment. 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 His wife is a good support. Patient will benefit from participation in pulmonary rehab for nutrition, exercise, and lifestyle modification. Goal not met. Brandon Phillips reports motivation to lose weight. He is up ~20# since June but he reports weight gain since starting cancer treatment. He is up 1.5# since starting with our program. 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 His wife is a good support. He has returned back to work full time. Patient will benefit from participation in pulmonary rehab for nutrition, exercise, and lifestyle modification.               Nutrition Goals Discharge (Final Nutrition Goals Re-Evaluation):  Nutrition Goals Re-Evaluation - 05/26/23 1133       Goals   Current Weight 180 lb 8.9 oz (81.9 kg)    Comment no new labs; most recent labs  lipids WNL, LDL 88, CBC WNL    Expected Outcome Goal not met. Brandon Phillips reports motivation to lose weight. He is up ~20# since June but he reports weight gain since starting cancer treatment. He is up 1.5# since starting with our program. 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 His wife is a good support. He has returned back to work full time. Patient will benefit from participation in pulmonary rehab for nutrition, exercise, and lifestyle modification.             Psychosocial: Target Goals: Acknowledge presence or absence of significant depression and/or stress, maximize coping skills, provide positive support system. Participant is able to verbalize types and ability to use techniques and skills needed for reducing stress and depression.  Initial Review & Psychosocial Screening:  Initial Psych Review & Screening - 04/21/23 1331       Family Dynamics   Good Support System? Yes    Comments Brandon Phillips wife      Barriers    Psychosocial barriers to participate in program There are no identifiable barriers or psychosocial needs.;The patient should benefit from training in stress management and relaxation.      Screening Interventions   Interventions Encouraged to exercise    Expected Outcomes Short Term goal: Utilizing psychosocial counselor, staff and physician to assist with identification of specific Stressors or current issues interfering with healing process. Setting desired goal for each stressor or current issue identified.;Long Term Goal: Stressors or current issues are controlled or eliminated.;Short Term goal: Identification and review with participant of any Quality of Life or Depression concerns found by scoring the questionnaire.;Long Term goal: The participant improves quality of Life and PHQ9 Scores as seen by post scores and/or verbalization of changes             Quality of Life Scores:  Scores of 19 and below usually indicate a poorer quality of life in these areas.  A difference of  2-3 points is a clinically meaningful difference.  A difference of 2-3 points in the total score of the Quality of Life Index has been associated with significant improvement in overall quality of life, self-image, physical symptoms, and general health in studies assessing change in quality of life.  PHQ-9: Review Flowsheet  More data exists      04/21/2023 04/16/2023 09/05/2022 01/04/2022 12/27/2020  Depression screen PHQ 2/9  Decreased Interest 0 0 0 0 0 0  Down, Depressed, Hopeless 0 0 0 0 0 0  PHQ - 2 Score 0 0 0 0 0 0  Altered sleeping 0 - - - -  Tired, decreased energy 0 - - - -  Change in appetite 0 - - - -  Feeling bad or failure about yourself  1 - - - -  Trouble concentrating 0 - - - -  Moving slowly or fidgety/restless 0 - - - -  Suicidal thoughts 0 - - - -  PHQ-9 Score 1 - - - -  Difficult doing work/chores Not difficult at all - - - -    Details       Multiple values from one day are sorted in  reverse-chronological order        Interpretation of Total Score  Total Score Depression Severity:  1-4 = Minimal depression, 5-9 = Mild depression, 10-14 = Moderate depression, 15-19 = Moderately severe depression, 20-27 = Severe depression   Psychosocial Evaluation and Intervention:  Psychosocial Evaluation - 04/21/23 1332       Psychosocial Evaluation & Interventions   Interventions Encouraged to exercise with the program and follow exercise prescription    Expected Outcomes For pt to participate in PR    Continue Psychosocial Services  No Follow up required             Psychosocial Re-Evaluation:  Psychosocial Re-Evaluation     Row Name 04/23/23 1133 05/21/23 9147           Psychosocial Re-Evaluation   Current issues with None Identified None Identified      Comments No changes since orientation. Brandon Phillips is scheduled to start the program on 04/29/2023 At time of re-evaluation Brandon Phillips still denies any psychosocial issues. He denies any needs at this time and states he has good support from his wife and family.      Expected Outcomes For Brandon Phillips to attend PR free of psychosocial barriers or concerns. For Brandon Phillips to continue to attend PR free of psychosocial barriers or concerns.      Interventions Encouraged to attend Pulmonary Rehabilitation for the exercise Encouraged to attend Pulmonary Rehabilitation for the exercise      Continue Psychosocial Services  No Follow up required No Follow up required               Psychosocial Discharge (Final Psychosocial Re-Evaluation):  Psychosocial Re-Evaluation - 05/21/23 8295       Psychosocial Re-Evaluation   Current issues with None Identified    Comments At time of re-evaluation Brandon Phillips still denies any psychosocial issues. He denies any needs at this time and states he has good support from his wife and family.    Expected Outcomes For Brandon Phillips to continue to attend PR free of psychosocial barriers or concerns.    Interventions Encouraged  to attend Pulmonary Rehabilitation for the exercise    Continue Psychosocial Services  No Follow up required             Education: Education Goals:  Education classes will be provided on a weekly basis, covering required topics. Participant will state understanding/return demonstration of topics presented.  Learning Barriers/Preferences:  Learning Barriers/Preferences - 04/21/23 1332       Learning Barriers/Preferences   Learning Barriers Hearing   mild hearing loss   Learning Preferences Video;Individual Instruction;Written Material             Education Topics: Know Your Numbers Group instruction that is supported by a PowerPoint presentation. Instructor discusses importance of knowing and understanding resting, exercise, and post-exercise oxygen saturation, heart rate, and blood pressure. Oxygen saturation, heart rate, blood pressure, rating of perceived exertion, and dyspnea are reviewed along with a normal range for these values.    Exercise for the Pulmonary Patient Group instruction that is supported by a PowerPoint presentation. Instructor discusses benefits of exercise, core components of exercise, frequency, duration, and intensity of an exercise routine, importance of utilizing pulse oximetry during exercise, safety while exercising, and options of places to exercise outside of rehab.    MET Level  Group instruction provided by PowerPoint, verbal discussion, and written material to support subject matter. Instructor reviews what METs are and how to increase METs.    Pulmonary Medications Verbally interactive group education provided by instructor with focus on inhaled medications and proper administration.   Anatomy and Physiology of the Respiratory System Group instruction provided by PowerPoint, verbal discussion, and written material to support subject matter. Instructor reviews respiratory cycle and anatomical components of the respiratory system and their  functions. Instructor also reviews differences in obstructive and restrictive respiratory diseases with examples of each.    Oxygen Safety Group instruction provided by PowerPoint, verbal discussion, and written material to support subject matter. There is an overview of "What is Oxygen" and "Why do we need it".  Instructor also reviews how to create a safe environment for oxygen use, the importance of using oxygen as prescribed, and the risks of noncompliance. There is a brief discussion on traveling with oxygen and resources the patient may utilize. Flowsheet Row PULMONARY REHAB CHRONIC OBSTRUCTIVE PULMONARY DISEASE from 05/08/2023 in [facility]  Date 05/08/23  Educator RN  Instruction Review Code 1- Verbalizes Understanding       Oxygen Use Group instruction provided by PowerPoint, verbal discussion, and written material to discuss how supplemental oxygen is prescribed and different types of oxygen supply systems. Resources for more information are provided.  Flowsheet Row PULMONARY REHAB CHRONIC OBSTRUCTIVE PULMONARY DISEASE from 05/15/2023 in [facility]  Date 05/15/23  Educator RT  Instruction Review Code 1- Verbalizes Understanding       Breathing Techniques Group instruction that is supported by demonstration and informational handouts. Instructor discusses the benefits of pursed lip and diaphragmatic breathing and detailed demonstration on how to perform both.     Risk Factor Reduction Group instruction that is supported by a PowerPoint presentation. Instructor discusses the definition of a risk factor, different risk factors for pulmonary disease, and how the heart and lungs work together.   Pulmonary Diseases Group instruction provided by PowerPoint, verbal discussion, and written material to support subject matter. Instructor gives an overview of the  different type of pulmonary diseases. There is also a discussion on risk factors and symptoms as well as ways to manage the diseases.   Stress and Energy Conservation Group instruction provided by PowerPoint, verbal discussion, and written material to support subject matter. Instructor  gives an overview of stress and the impact it can have on the body. Instructor also reviews ways to reduce stress. There is also a discussion on energy conservation and ways to conserve energy throughout the day.   Warning Signs and Symptoms Group instruction provided by PowerPoint, verbal discussion, and written material to support subject matter. Instructor reviews warning signs and symptoms of stroke, heart attack, cold and flu. Instructor also reviews ways to prevent the spread of infection.   Other Education Group or individual verbal, written, or video instructions that support the educational goals of the pulmonary rehab program.    Knowledge Questionnaire Score:  Knowledge Questionnaire Score - 04/21/23 1334       Knowledge Questionnaire Score   Pre Score 16/18             Core Components/Risk Factors/Patient Goals at Admission:  Personal Goals and Risk Factors at Admission - 04/21/23 1335       Core Components/Risk Factors/Patient Goals on Admission    Weight Management Weight Loss    Improve shortness of breath with ADL's Yes    Intervention Provide education, individualized exercise plan and daily activity instruction to help decrease symptoms of SOB with activities of daily living.    Expected Outcomes Short Term: Improve cardiorespiratory fitness to achieve a reduction of symptoms when performing ADLs;Long Term: Be able to perform more ADLs without symptoms or delay the onset of symptoms             Core Components/Risk Factors/Patient Goals Review:   Goals and Risk Factor Review     Row Name 04/23/23 1141 05/21/23 0826           Core Components/Risk Factors/Patient  Goals Review   Personal Goals Review Weight Management/Obesity;Improve shortness of breath with ADL's;Develop more efficient breathing techniques such as purse lipped breathing and diaphragmatic breathing and practicing self-pacing with activity. Weight Management/Obesity;Improve shortness of breath with ADL's;Develop more efficient breathing techniques such as purse lipped breathing and diaphragmatic breathing and practicing self-pacing with activity.      Review Unable to assess. Brandon Phillips is scheduled to start the program on 04/29/23 Goals in action for weight loss. Brandon Phillips has been working with our dietician on weight loss, diet, and healthier eating habits. Goal is progress on improving shortness of breath with ADLs. Brandon Phillips has been able to maintain his oxygen saturation on room air while exercising. He has increased his METs and workload while maintaining his oxygen saturation. Brandon Phillips has met his goal of developing more efficient breathing techniques such as purse lipped breathing and diaphragmatic breathing and practicing self-pacing with activity. Brandon Phillips can initiate PLB on his own without staff promting and is able to listen to his body and self pace himself.      Expected Outcomes For Brandon Phillips to lose weight, improve his shortness of breath with ADLs, develop more efficient breathing techniques, learn how to self-pace. For Brandon Phillips to lose weight and improve his shortness of breath with ADLs.               Core Components/Risk Factors/Patient Goals at Discharge (Final Review):   Goals and Risk Factor Review - 05/21/23 0826       Core Components/Risk Factors/Patient Goals Review   Personal Goals Review Weight Management/Obesity;Improve shortness of breath with ADL's;Develop more efficient breathing techniques such as purse lipped breathing and diaphragmatic breathing and practicing self-pacing with activity.    Review Goals in action for weight loss. Brandon Phillips has been working with our dietician on  weight loss, diet,  and healthier eating habits. Goal is progress on improving shortness of breath with ADLs. Brandon Phillips has been able to maintain his oxygen saturation on room air while exercising. He has increased his METs and workload while maintaining his oxygen saturation. Brandon Phillips has met his goal of developing more efficient breathing techniques such as purse lipped breathing and diaphragmatic breathing and practicing self-pacing with activity. Brandon Phillips can initiate PLB on his own without staff promting and is able to listen to his body and self pace himself.    Expected Outcomes For Brandon Phillips to lose weight and improve his shortness of breath with ADLs.             ITP Comments:   Comments: Pt is making expected progress toward Pulmonary Rehab goals after completing 8 session(s). Recommend continued exercise, life style modification, education, and utilization of breathing techniques to increase stamina and strength, while also decreasing shortness of breath with exertion.  Dr. Mechele Collin is Medical Director for Pulmonary Rehab at [facility].

## 2023-05-29 ENCOUNTER — Encounter (HOSPITAL_COMMUNITY)
Admission: RE | Admit: 2023-05-29 | Discharge: 2023-05-29 | Disposition: A | Discharge status: Home / Self Care | Payer: BC Managed Care – PPO | Source: Ambulatory Visit | Attending: Internal Medicine | Admitting: Internal Medicine

## 2023-05-29 DIAGNOSIS — J449 Chronic obstructive pulmonary disease, unspecified: Secondary | ICD-10-CM | POA: Diagnosis not present

## 2023-05-29 NOTE — Progress Notes (Signed)
Daily Session Note  Patient Details  Name: Brandon Phillips. MRN: [id number] Date of Birth: February 20, 1966 Referring Provider:   Doristine Devoid Pulmonary Rehab Walk Test from 04/21/2023 in [facility]  Referring Provider Ramaswamy       Encounter Date: 05/29/2023  Check In:  Session Check In - 05/29/23 1403       Check-In   Supervising physician immediately available to respond to emergencies [facility] MD immediately available    Physician(s) Carlyon Shadow, NP    Location [facility]    Staff Present Durel Salts, Patriciaann Clan, RN, BSN;Samantha Belarus, RD, Rexene Agent, MS, ACSM-CEP, Exercise Physiologist;Johnny Hale Bogus, MS, Exercise Physiologist    Virtual Visit No    Medication changes reported     No    Fall or balance concerns reported    No    Comments Pt tripped in class getting on treadmill. No injuries.    Tobacco Cessation No Change    Warm-up and Cool-down Performed as group-led instruction    Resistance Training Performed Yes    VAD Patient? No    PAD/SET Patient? No      Pain Assessment   Currently in Pain? No/denies    Pain Score 0-No pain    Multiple Pain Sites No             Capillary Blood Glucose: No results found for this or any previous visit (from the past 24 hour(s)).    Social History   Tobacco Use  Smoking Status Former   Current packs/day: 0.00   Average packs/day: 1.5 packs/day for 35.0 years (52.5 ttl pk-yrs)   Types: Cigarettes   Start date: 03/30/1982   Quit date: 03/30/2017   Years since quitting: 6.1  Smokeless Tobacco Never    Goals Met:  Proper associated with RPD/PD & O2 Sat Independence with exercise equipment Exercise tolerated well No report of concerns or symptoms today Strength training completed today  Goals Unmet:  Not Applicable  Comments: Service time is from 1314 to 1436.    Dr. Mechele Collin is Medical Director for Pulmonary  Rehab at [facility].

## 2023-05-30 ENCOUNTER — Ambulatory Visit: Payer: BC Managed Care – PPO

## 2023-05-30 ENCOUNTER — Ambulatory Visit (INDEPENDENT_AMBULATORY_CARE_PROVIDER_SITE_OTHER): Payer: BC Managed Care – PPO | Admitting: Podiatry

## 2023-05-30 ENCOUNTER — Other Ambulatory Visit: Payer: Self-pay | Admitting: Podiatry

## 2023-05-30 ENCOUNTER — Encounter: Payer: Self-pay | Admitting: Podiatry

## 2023-05-30 DIAGNOSIS — M79671 Pain in right foot: Secondary | ICD-10-CM | POA: Diagnosis not present

## 2023-05-30 DIAGNOSIS — M79672 Pain in left foot: Secondary | ICD-10-CM | POA: Diagnosis not present

## 2023-05-30 DIAGNOSIS — M19071 Primary osteoarthritis, right ankle and foot: Secondary | ICD-10-CM | POA: Diagnosis not present

## 2023-05-30 DIAGNOSIS — M722 Plantar fascial fibromatosis: Secondary | ICD-10-CM

## 2023-05-30 DIAGNOSIS — M25775 Osteophyte, left foot: Secondary | ICD-10-CM

## 2023-05-30 DIAGNOSIS — G8929 Other chronic pain: Secondary | ICD-10-CM

## 2023-05-30 NOTE — Patient Instructions (Signed)

## 2023-05-30 NOTE — Progress Notes (Signed)
  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 male presents for concern of bilateral heel pain that has been ongoing for about a year and a half. Tends to come and go. Relates pain when getting up from rest. Most of the pain in the bottom of his heel. 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 Has tried various inserts and wedges in the past.  Denies any current treatments.   . Denies any other pedal complaints. Denies n/v/f/c.   Past Medical History:  Diagnosis Date   Cancer (HCC)    lung cancer   CAP (community acquired pneumonia)    COPD (chronic obstructive pulmonary disease) (HCC)    Eczema    HLD (hyperlipidemia)    Primary cancer of left lower lobe of lung (HCC) 09/17/2022   Tobacco use disorder 01/24/2017    Objective:  Physical Exam: Vascular: DP/PT pulses 2/4 bilateral. CFT <3 seconds. Normal hair growth on digits. No edema.  Skin. No lacerations or abrasions bilateral feet.  Musculoskeletal: MMT 5/5 bilateral lower extremities in DF, PF, Inversion and Eversion. Deceased ROM in DF of ankle joint. Tender to the medial calcaneal tubercle bilaterally . No pain with achilles, PT or arch. No pain with calcaneal squeeze. More pain on left than right.  Neurological: Sensation intact to light touch.   Assessment:   1. Plantar fasciitis, bilateral      Plan:  Patient was evaluated and treated and all questions answered. Discussed plantar fasciitis with patient.  X-rays reviewed and discussed with patient. No acute fractures or dislocations noted. Minimal spurring noted at inferior calcaneus.  Discussed treatment options including, ice, NSAIDS, supportive shoes, bracing, and stretching. Stretching exercises  provided to be done on a daily basis. PF brace dispensed.    Will avoid NSAIDs due to blood thinner and would like to avoid steroids as he is on immunotherapy. Briefly discussed steroid injection but would like to hold off and see if stretching and brace help.  Follow-up 6 weeks or sooner if any problems arise. In the meantime, encouraged to call the office with any questions, concerns, change in symptoms.     Louann Sjogren, DPM

## 2023-06-03 ENCOUNTER — Encounter (HOSPITAL_COMMUNITY)
Admission: RE | Admit: 2023-06-03 | Discharge: 2023-06-03 | Disposition: A | Discharge status: Home / Self Care | Payer: BC Managed Care – PPO | Source: Ambulatory Visit | Attending: Internal Medicine | Admitting: Internal Medicine

## 2023-06-03 DIAGNOSIS — J449 Chronic obstructive pulmonary disease, unspecified: Secondary | ICD-10-CM | POA: Diagnosis not present

## 2023-06-03 DIAGNOSIS — C3432 Malignant neoplasm of lower lobe, left bronchus or lung: Secondary | ICD-10-CM | POA: Insufficient documentation

## 2023-06-03 NOTE — Progress Notes (Signed)
Daily Session Note  Patient Details  Name: Brandon Phillips. MRN: [id number] Date of Birth: [birth year] Referring Provider:   Doristine Devoid Pulmonary Rehab Walk Test from 04/21/2023 in [facility], [facility]  Referring Provider Ramaswamy       Encounter Date: 06/03/2023  Check In:  Session Check In - 06/03/23 1415       Check-In   Supervising physician immediately available to respond to emergencies [facility] MD immediately available    Physician(s) Carlyon Shadow, NP    Location [facility]    Staff Present Durel Salts, Patriciaann Clan, RN, BSN;Samantha Belarus, RD, Rexene Agent, MS, ACSM-CEP, Exercise Physiologist;Randi Dionisio Paschal, ACSM-CEP, Exercise Physiologist    Virtual Visit No    Medication changes reported     No    Fall or balance concerns reported    No    Tobacco Cessation No Change    Warm-up and Cool-down Performed as group-led instruction    Resistance Training Performed Yes    VAD Patient? No    PAD/SET Patient? No      Pain Assessment   Currently in Pain? No/denies    Multiple Pain Sites No             Capillary Blood Glucose: No results found for this or any previous visit (from the past 24 hour(s)).    Social History   Tobacco Use  Smoking Status Former   Current packs/day: 0.00   Average packs/day: 1.5 packs/day for 35.0 years (52.5 ttl pk-yrs)   Types: Cigarettes   Start date: 03/30/1982   Quit date: 03/30/2017   Years since quitting: 6.1  Smokeless Tobacco Never    Goals Met:  Proper associated with RPD/PD & O2 Sat Independence with exercise equipment Exercise tolerated well No report of concerns or symptoms today Strength training completed today  Goals Unmet:  Not Applicable  Comments: Service time is from 1311 to 1441.    Dr. Mechele Collin is Medical Director for Pulmonary Rehab at [facility]

## 2023-06-05 ENCOUNTER — Encounter (HOSPITAL_COMMUNITY)
Admission: RE | Admit: 2023-06-05 | Discharge: 2023-06-05 | Disposition: A | Discharge status: Home / Self Care | Payer: BC Managed Care – PPO | Source: Ambulatory Visit | Attending: Internal Medicine | Admitting: Internal Medicine

## 2023-06-05 VITALS — Wt 177.9 lb

## 2023-06-05 DIAGNOSIS — J449 Chronic obstructive pulmonary disease, unspecified: Secondary | ICD-10-CM

## 2023-06-05 DIAGNOSIS — C3432 Malignant neoplasm of lower lobe, left bronchus or lung: Secondary | ICD-10-CM | POA: Diagnosis not present

## 2023-06-05 NOTE — Progress Notes (Signed)
Daily Session Note  Patient Details  Name: Brandon Phillips. MRN: [id number] Date of Birth: 04-23-1966 Referring Provider:   Doristine Devoid Pulmonary Rehab Walk Test from 04/21/2023 in [facility], Vascular, & [facility]  Referring Provider Ramaswamy       Encounter Date: 06/05/2023  Check In:  Session Check In - 06/05/23 1335       Check-In   Supervising physician immediately available to respond to emergencies [facility] MD immediately available    Physician(s) Robin Searing, NP    Location [facility]    Staff Present Durel Salts, Patriciaann Clan, RN, Doris Cheadle, MS, ACSM-CEP, Exercise Physiologist;Randi Dionisio Paschal, ACSM-CEP, Exercise Physiologist    Virtual Visit No    Medication changes reported     No    Fall or balance concerns reported    No    Tobacco Cessation No Change    Warm-up and Cool-down Performed as group-led instruction    Resistance Training Performed Yes    VAD Patient? No    PAD/SET Patient? No      Pain Assessment   Currently in Pain? No/denies    Multiple Pain Sites No             Capillary Blood Glucose: No results found for this or any previous visit (from the past 24 hour(s)).    Social History   Tobacco Use  Smoking Status Former   Current packs/day: 0.00   Average packs/day: 1.5 packs/day for 35.0 years (52.5 ttl pk-yrs)   Types: Cigarettes   Start date: 03/30/1982   Quit date: 03/30/2017   Years since quitting: 6.1  Smokeless Tobacco Never    Goals Met:  Proper associated with RPD/PD & O2 Sat Independence with exercise equipment Exercise tolerated well No report of concerns or symptoms today Strength training completed today  Goals Unmet:  Not Applicable  Comments: Service time is from 1309 to 1441.    Dr. Mechele Collin is Medical Director for Pulmonary Rehab at [facility].

## 2023-06-09 ENCOUNTER — Inpatient Hospital Stay: Payer: BC Managed Care – PPO

## 2023-06-09 ENCOUNTER — Inpatient Hospital Stay (HOSPITAL_BASED_OUTPATIENT_CLINIC_OR_DEPARTMENT_OTHER): Payer: BC Managed Care – PPO | Admitting: Medical Oncology

## 2023-06-09 ENCOUNTER — Other Ambulatory Visit: Payer: Self-pay

## 2023-06-09 ENCOUNTER — Inpatient Hospital Stay: Payer: BC Managed Care – PPO | Attending: Hematology & Oncology

## 2023-06-09 ENCOUNTER — Encounter: Payer: Self-pay | Admitting: Medical Oncology

## 2023-06-09 VITALS — BP 107/67 | HR 78 | Temp 98.1°F | Resp 18 | Ht 65.0 in | Wt 178.1 lb

## 2023-06-09 VITALS — BP 105/69 | HR 60 | Resp 17

## 2023-06-09 DIAGNOSIS — C3432 Malignant neoplasm of lower lobe, left bronchus or lung: Secondary | ICD-10-CM | POA: Insufficient documentation

## 2023-06-09 DIAGNOSIS — Z79899 Other long term (current) drug therapy: Secondary | ICD-10-CM | POA: Diagnosis not present

## 2023-06-09 DIAGNOSIS — Z86718 Personal history of other venous thrombosis and embolism: Secondary | ICD-10-CM

## 2023-06-09 DIAGNOSIS — Z5112 Encounter for antineoplastic immunotherapy: Secondary | ICD-10-CM | POA: Diagnosis not present

## 2023-06-09 DIAGNOSIS — Z87891 Personal history of nicotine dependence: Secondary | ICD-10-CM | POA: Insufficient documentation

## 2023-06-09 DIAGNOSIS — Z79620 Long term (current) use of immunosuppressive biologic: Secondary | ICD-10-CM | POA: Diagnosis not present

## 2023-06-09 LAB — CBC WITH DIFFERENTIAL (CANCER CENTER ONLY)
Abs Immature Granulocytes: 0.03 10*3/uL (ref 0.00–0.07)
Basophils Absolute: 0.1 10*3/uL (ref 0.0–0.1)
Basophils Relative: 1 %
Eosinophils Absolute: 0.2 10*3/uL (ref 0.0–0.5)
Eosinophils Relative: 2 %
HCT: 42.6 % (ref 39.0–52.0)
Hemoglobin: 14.5 g/dL (ref 13.0–17.0)
Immature Granulocytes: 0 %
Lymphocytes Relative: 8 %
Lymphs Abs: 0.7 10*3/uL (ref 0.7–4.0)
MCH: 29.4 pg (ref 26.0–34.0)
MCHC: 34 g/dL (ref 30.0–36.0)
MCV: 86.4 fL (ref 80.0–100.0)
Monocytes Absolute: 0.7 10*3/uL (ref 0.1–1.0)
Monocytes Relative: 9 %
Neutro Abs: 6.5 10*3/uL (ref 1.7–7.7)
Neutrophils Relative %: 80 %
Platelet Count: 238 10*3/uL (ref 150–400)
RBC: 4.93 MIL/uL (ref 4.22–5.81)
RDW: 14.3 % (ref 11.5–15.5)
WBC Count: 8.2 10*3/uL (ref 4.0–10.5)
nRBC: 0 % (ref 0.0–0.2)

## 2023-06-09 LAB — CMP (CANCER CENTER ONLY)
ALT: 28 U/L (ref 0–44)
AST: 18 U/L (ref 15–41)
Albumin: 4.4 g/dL (ref 3.5–5.0)
Alkaline Phosphatase: 56 U/L (ref 38–126)
Anion gap: 9 (ref 5–15)
BUN: 14 mg/dL (ref 6–20)
CO2: 28 mmol/L (ref 22–32)
Calcium: 9.6 mg/dL (ref 8.9–10.3)
Chloride: 101 mmol/L (ref 98–111)
Creatinine: 0.98 mg/dL (ref 0.61–1.24)
GFR, Estimated: >60 mL/min (ref >60)
Glucose, Bld: 99 mg/dL (ref 70–99)
Potassium: 4 mmol/L (ref 3.5–5.1)
Sodium: 138 mmol/L (ref 135–145)
Total Bilirubin: 0.4 mg/dL (ref <1.2)
Total Protein: 7.3 g/dL (ref 6.5–8.1)

## 2023-06-09 LAB — TSH: TSH: 3.118 u[IU]/mL (ref 0.350–4.500)

## 2023-06-09 MED ORDER — SODIUM CHLORIDE 0.9 % IV SOLN: Freq: Once | INTRAVENOUS | Status: AC

## 2023-06-09 MED ORDER — TELMISARTAN 20 MG PO TABS: 20.0000 mg | ORAL_TABLET | Freq: Every day | ORAL | 2 refills | Status: DC

## 2023-06-09 MED ORDER — SODIUM CHLORIDE 0.9 % IV SOLN
1500.0000 mg | Freq: Once | INTRAVENOUS | Status: AC
  Administered 2023-06-09: 1500 mg via INTRAVENOUS
  Filled 2023-06-09: qty 30

## 2023-06-09 MED ORDER — SODIUM CHLORIDE 0.9% FLUSH
10.0000 mL | INTRAVENOUS | Status: DC | PRN
  Administered 2023-06-09: 10 mL

## 2023-06-09 MED ORDER — GABAPENTIN 300 MG PO CAPS: 300.0000 mg | ORAL_CAPSULE | Freq: Two times a day (BID) | ORAL | 3 refills | Status: DC

## 2023-06-09 MED ORDER — HEPARIN SOD (PORK) LOCK FLUSH 100 UNIT/ML IV SOLN
500.0000 [IU] | Freq: Once | INTRAVENOUS | Status: AC | PRN
  Administered 2023-06-09: 500 [IU]

## 2023-06-09 NOTE — Patient Instructions (Signed)

## 2023-06-09 NOTE — Progress Notes (Signed)
Hematology and Oncology Follow Up Visit  Brandon Phillips [id number] May 04, 1966 57 y.o. 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 twice daily-start on 12/06/2022 Status post radiosurgery-completed on 02/11/2023 Durvalumab 1500 mg IV monthly-s/p cycle #3-- start on 02/17/2023     Interim History:  Brandon Phillips is back for follow-up and consideration of cycle 5 day 1 of Durvalumab   He reports that he has been doing well. They have noticed that his BO has been running really good at home and in the office. They ask if they can stop or lower his Telmesartan. They also ask for his gabapentin to be refilled. He takes this twice per day.   He did have a CT scan done for follow-up over the pulmonary embolism.  This was done on 04/23/2023.  Thankfully, this did not show any pulmonary embolism and showed that his pulmonary nodules were shrinking   He has had no rashes.  Has had no leg swelling.  He has had no urinary difficulties.  I am so happy that he is doing well.  Overall, his performance status is ECOG 1. Wt Readings from Last 3 Encounters:  06/09/23 178 lb 1.6 oz (80.8 kg)  05/27/23 178 lb 2.1 oz (80.8 kg)  05/13/23 179 lb 3.7 oz (81.3 kg)     Medications:  Current Outpatient Medications:    albuterol (PROVENTIL) (2.5 MG/3ML) 0.083% nebulizer solution, Take 3 mLs (2.5 mg total) by nebulization every 6 (six) hours as needed for wheezing or shortness of breath., Disp: 75 mL, Rfl: 5   albuterol (VENTOLIN HFA) 108 (90 Base) MCG/ACT inhaler, Inhale 2 puffs into the lungs every 6 (six) hours as needed for wheezing., Disp: 2 each, Rfl: 11   apixaban (ELIQUIS) 2.5 MG TABS tablet, Take 1 tablet (2.5 mg total) by mouth 2 (two) times daily., Disp: 60 tablet, Rfl: 12    atorvastatin (LIPITOR) 20 MG tablet, TAKE ONE TABLET BY MOUTH DAILY, Disp: 90 tablet, Rfl: 3   benzonatate (TESSALON) 200 MG capsule, Take 1 capsule (200 mg total) by mouth 3 (three) times daily as needed for cough., Disp: 30 capsule, Rfl: 1   Budeson-Glycopyrrol-Formoterol (BREZTRI AEROSPHERE) 160-9-4.8 MCG/ACT AERO, Inhale 2 puffs into the lungs in the morning and at bedtime., Disp: 1 each, Rfl: 11   COENZYME Q10 PO, Take 100 mg by mouth in the morning., Disp: , Rfl:    gabapentin (NEURONTIN) 300 MG capsule, TAKE 1 CAPSULE BY MOUTH EVERY NIGHT AT BEDTIME FOR 3 NIGHTS, THEN INCREASE TO TAKE 1 CAPSULE BY MOUTH 2 TIMES A DAY FOR 1 WEEK, THEN OK TO INCREASE TO TAKE 1 CAPSULE BY MOUTH 3 TIMES A DAY, Disp: 90 capsule, Rfl: 0   GUAIFENESIN 1200 PO, Take 1,200 mg by mouth in the morning and at bedtime., Disp: , Rfl:    lidocaine-prilocaine (EMLA) cream, Apply a dime size to port-a-cath 1-2 hours prior to access. Cover with saran wrap., Disp: 30 g, Rfl: 2   Multiple Vitamin (MULTIVITAMIN) capsule, Take 1 capsule by mouth daily., Disp: , Rfl:    telmisartan (MICARDIS) 40 MG tablet, TAKE 1 TABLET BY MOUTH DAILY, Disp: 30 tablet, Rfl: 4  Allergies: No Known Allergies  Past Medical History, Surgical history, Social history, and Family History were reviewed and updated.  Review  of Systems: Review of Systems  Constitutional: Negative.   HENT:  Negative.    Eyes: Negative.   Respiratory:  Positive for cough.   Cardiovascular: Negative.   Gastrointestinal: Negative.   Endocrine: Negative.   Genitourinary: Negative.    Musculoskeletal: Negative.   Skin: Negative.   Neurological: Negative.   Hematological: Negative.   Psychiatric/Behavioral: Negative.      Physical Exam: His vital signs are temperature 98.5.  Pulse 94.  Blood pressure 114/78.  Weight is 177 pounds. .     Wt Readings from Last 3 Encounters:  06/09/23 178 lb 1.6 oz (80.8 kg)  05/27/23 178 lb 2.1 oz (80.8 kg)  05/13/23 179 lb 3.7  oz (81.3 kg)    Physical Exam Vitals reviewed.  HENT:     Head: Normocephalic and atraumatic.  Eyes:     Pupils: Pupils are equal, round, and reactive to light.  Cardiovascular:     Rate and Rhythm: Normal rate and regular rhythm.     Heart sounds: Normal heart sounds.  Pulmonary:     Effort: Pulmonary effort is normal.     Breath sounds: Normal breath sounds.  Abdominal:     General: Bowel sounds are normal.     Palpations: Abdomen is soft.  Musculoskeletal:        General: No tenderness or deformity. Normal range of motion.     Cervical back: Normal range of motion.  Lymphadenopathy:     Cervical: No cervical adenopathy.  Skin:    General: Skin is warm and dry.     Findings: No erythema or rash.  Neurological:     Mental Status: He is alert and oriented to person, place, and time.  Psychiatric:        Behavior: Behavior normal.        Thought Content: Thought content normal.        Judgment: Judgment normal.     Lab Results  Component Value Date   WBC 8.2 06/09/2023   HGB 14.5 06/09/2023   HCT 42.6 06/09/2023   MCV 86.4 06/09/2023   PLT 238 06/09/2023     Chemistry      Component Value Date/Time   NA 138 06/09/2023 1000   NA 138 04/16/2023 1109   K 4.0 06/09/2023 1000   CL 101 06/09/2023 1000   CO2 28 06/09/2023 1000   BUN 14 06/09/2023 1000   BUN 11 04/16/2023 1109   CREATININE 0.98 06/09/2023 1000   CREATININE 1.05 01/11/2022 0000      Component Value Date/Time   CALCIUM 9.6 06/09/2023 1000   ALKPHOS 56 06/09/2023 1000   AST 18 06/09/2023 1000   ALT 28 06/09/2023 1000   BILITOT 0.4 06/09/2023 1000     Encounter Diagnoses  Name Primary?   Malignant neoplasm of lower lobe of left lung (HCC) Yes   Personal history of venous thrombosis and embolism     Impression and Plan: Brandon Phillips is a very nice 57 year old white male.  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  Again, he does not wish to have surgery.  He has seen Dr.  Dorris Fetch of thoracic surgery.  He completed radiation/chemotherapy.  He has responded but still, I think, there is residual disease. He has had radiosurgery.  I will now have him on immunotherapy.  Treatment today- Durvalumab  PET scan planned for December.  Labs reviewed and acceptable for treatment today RTC 1 month MD, labs(CBC w/ ,CMP), Durvalumab  Rushie Chestnut, [name] 11/11/202410:53 AM

## 2023-06-09 NOTE — Patient Instructions (Signed)
 [facility] - A DEPT OF [facility]  Discharge Instructions: Thank you for choosing [facility] to provide your oncology and hematology care.   If you have a lab appointment with the Cancer Center, please go directly to the Cancer Center and check in at the registration area.  Wear comfortable clothing and clothing appropriate for easy access to any Portacath or PICC line.   We strive to give you quality time with your provider. You may need to reschedule your appointment if you arrive late (15 or more minutes).  Arriving late affects you and other patients whose appointments are after yours.  Also, if you miss three or more appointments without notifying the office, you may be dismissed from the clinic at the provider's discretion.      For prescription refill requests, have your pharmacy contact our office and allow 72 hours for refills to be completed.    Today you received the following chemotherapy and/or immunotherapy agents:  Imfinzi      To help prevent nausea and vomiting after your treatment, we encourage you to take your nausea medication as directed.  BELOW ARE SYMPTOMS THAT SHOULD BE REPORTED IMMEDIATELY: *FEVER GREATER THAN 100.4 F (38 C) OR HIGHER *CHILLS OR SWEATING *NAUSEA AND VOMITING THAT IS NOT CONTROLLED WITH YOUR NAUSEA MEDICATION *UNUSUAL SHORTNESS OF BREATH *UNUSUAL BRUISING OR BLEEDING *URINARY PROBLEMS (pain or burning when urinating, or frequent urination) *BOWEL PROBLEMS (unusual diarrhea, constipation, pain near the anus) TENDERNESS IN MOUTH AND THROAT WITH OR WITHOUT PRESENCE OF ULCERS (sore throat, sores in mouth, or a toothache) UNUSUAL RASH, SWELLING OR PAIN  UNUSUAL VAGINAL DISCHARGE OR ITCHING   Items with * indicate a potential emergency and should be followed up as soon as possible or go to the Emergency Department if any problems should occur.  Please show the CHEMOTHERAPY ALERT CARD or IMMUNOTHERAPY  ALERT CARD at check-in to the Emergency Department and triage nurse. Should you have questions after your visit or need to cancel or reschedule your appointment, please contact [facility] - A DEPT OF [facility]  [phone] and follow the prompts.  Office hours are 8:00 a.m. to 4:30 p.m. Monday - Friday. Please note that voicemails left after 4:00 p.m. may not be returned until the following business day.  We are closed weekends and major holidays. You have access to a nurse at all times for urgent questions. Please call the main number to the clinic [phone] and follow the prompts.  For any non-urgent questions, you may also contact your provider using MyChart. We now offer e-Visits for anyone 80 and older to request care online for non-urgent symptoms. For details visit [email or web address].   Also download the MyChart app! Go to the app store, search "MyChart", open the app, select [place], and log in with your MyChart username and password.

## 2023-06-10 ENCOUNTER — Encounter (HOSPITAL_COMMUNITY): Payer: BC Managed Care – PPO

## 2023-06-10 ENCOUNTER — Encounter (HOSPITAL_COMMUNITY): Payer: Self-pay

## 2023-06-10 ENCOUNTER — Encounter: Payer: Self-pay | Admitting: *Deleted

## 2023-06-10 NOTE — Progress Notes (Signed)
Patient proceeded with cycle five. He will need a PET prior to his next cycle. Scheduled for 07/09/2023.  Spoke to patient's wife, [name]. She is aware of PET appointment including date, time, and location. The following prep is reviewed with patient and confirmed with teachback: - arrive 30 minutes before appointment time - NPO except water for 6h before scan. No candy, no gum - hold any diabetic medication the morning of the scan - have a low carb dinner the night prior Radiology Information sheet also mailed to patient's home for reinforcement of education.   Oncology Nurse Navigator Documentation     06/10/2023    9:00 AM  Oncology Nurse Navigator Flowsheets  Navigator Follow Up Date: 07/09/2023  Navigator Follow Up Reason: Scan Review  Navigator Location [facility]  Navigator Encounter Type Appt/Treatment Plan Review;Telephone  Telephone Outgoing Call  Patient Visit Type MedOnc  Treatment Phase Active Tx  Barriers/Navigation Needs Coordination of Care  Education Other  Interventions Coordination of Care;Education  Acuity Level 1-No Barriers  Coordination of Care Radiology  Education Method Verbal;Written  Support Groups/Services Friends and Family  Time Spent with Patient 30

## 2023-06-12 ENCOUNTER — Encounter (HOSPITAL_COMMUNITY)
Admission: RE | Admit: 2023-06-12 | Discharge: 2023-06-12 | Disposition: A | Discharge status: Home / Self Care | Payer: BC Managed Care – PPO | Source: Ambulatory Visit | Attending: Internal Medicine | Admitting: Internal Medicine

## 2023-06-12 ENCOUNTER — Other Ambulatory Visit: Payer: Self-pay

## 2023-06-12 DIAGNOSIS — C3432 Malignant neoplasm of lower lobe, left bronchus or lung: Secondary | ICD-10-CM | POA: Diagnosis not present

## 2023-06-12 DIAGNOSIS — J449 Chronic obstructive pulmonary disease, unspecified: Secondary | ICD-10-CM | POA: Diagnosis not present

## 2023-06-12 NOTE — Progress Notes (Signed)
Daily Session Note  Patient Details  Name: Brandon Phillips. MRN: [id number] Date of Birth: 1966-03-29 Referring Provider:   Doristine Devoid Pulmonary Rehab Walk Test from 04/21/2023 in [facility]  Referring Provider Ramaswamy       Encounter Date: 06/12/2023  Check In:  Session Check In - 06/12/23 1349       Check-In   Supervising physician immediately available to respond to emergencies [facility] MD immediately available    Physician(s) [name], NP    Location [facility]    Staff Present Durel Salts, Zella Richer, MS, ACSM-CEP, Exercise Physiologist;Randi Idelle Crouch BS, ACSM-CEP, Exercise Physiologist;Samantha Belarus, RD, LDN    Virtual Visit No    Medication changes reported     No    Fall or balance concerns reported    No    Tobacco Cessation No Change    Warm-up and Cool-down Performed as group-led instruction    Resistance Training Performed Yes    VAD Patient? No    PAD/SET Patient? No      Pain Assessment   Currently in Pain? No/denies    Multiple Pain Sites No             Capillary Blood Glucose: No results found for this or any previous visit (from the past 24 hour(s)).    Social History   Tobacco Use  Smoking Status Former   Current packs/day: 0.00   Average packs/day: 1.5 packs/day for 35.0 years (52.5 ttl pk-yrs)   Types: Cigarettes   Start date: 03/30/1982   Quit date: 03/30/2017   Years since quitting: 6.2  Smokeless Tobacco Never    Goals Met:  Exercise tolerated well  Goals Unmet:  Not Applicable  Comments: Service time is from 1305 to 1430.    Dr. Mechele Collin is Medical Director for Pulmonary Rehab at [facility].

## 2023-06-17 ENCOUNTER — Encounter (HOSPITAL_COMMUNITY)
Admission: RE | Admit: 2023-06-17 | Discharge: 2023-06-17 | Disposition: A | Discharge status: Home / Self Care | Payer: BC Managed Care – PPO | Source: Ambulatory Visit | Attending: Internal Medicine | Admitting: Internal Medicine

## 2023-06-17 DIAGNOSIS — J449 Chronic obstructive pulmonary disease, unspecified: Secondary | ICD-10-CM

## 2023-06-17 DIAGNOSIS — C3432 Malignant neoplasm of lower lobe, left bronchus or lung: Secondary | ICD-10-CM | POA: Diagnosis not present

## 2023-06-17 NOTE — Progress Notes (Signed)
Daily Session Note  Patient Details  Name: Brandon Phillips. MRN: [id number] Date of Birth: 1966-05-01 Referring Provider:   Doristine Devoid Pulmonary Rehab Walk Test from 04/21/2023 in [facility]  Referring Provider Ramaswamy       Encounter Date: 06/17/2023  Check In:  Session Check In - 06/17/23 1329       Check-In   Supervising physician immediately available to respond to emergencies [facility] MD immediately available    Physician(s) [name], NP    [facility]    Staff Present Durel Salts, Zella Richer, MS, ACSM-CEP, Exercise Physiologist;Randi Dionisio Paschal, ACSM-CEP, Exercise Physiologist;Samantha Belarus, RD, Dutch Gray, RN, BSN    Virtual Visit No    Medication changes reported     No    Fall or balance concerns reported    No    Tobacco Cessation No Change    Warm-up and Cool-down Performed as group-led Writer Performed Yes    VAD Patient? No    PAD/SET Patient? No      Pain Assessment   Currently in Pain? No/denies    Multiple Pain Sites No             Capillary Blood Glucose: No results found for this or any previous visit (from the past 24 hour(s)).    Social History   Tobacco Use  Smoking Status Former   Current packs/day: 0.00   Average packs/day: 1.5 packs/day for 35.0 years (52.5 ttl pk-yrs)   Types: Cigarettes   Start date: 03/30/1982   Quit date: 03/30/2017   Years since quitting: 6.2  Smokeless Tobacco Never    Goals Met:  Proper associated with RPD/PD & O2 Sat Independence with exercise equipment Exercise tolerated well No report of concerns or symptoms today Strength training completed today  Goals Unmet:  Not Applicable  Comments: Service time is from 1315 to 1438.    Dr. Mechele Collin is Medical Director for Pulmonary Rehab at [facility].

## 2023-06-17 NOTE — Progress Notes (Signed)
Home Exercise Prescription I have reviewed a Home Exercise Prescription with [name].Marland Kitchen He is currently not doing cardiovascular exercise but is doing the resistance bands. Discussed adding one nonrehab day of walking 30 min or more. He is also interested in joining [employer] soon. The patient stated that their goals were to lose weight and get off oxygen. We reviewed exercise guidelines, target heart rate during exercise, RPE Scale, weather conditions, endpoints for exercise, warmup and cool down. The patient is encouraged to come to me with any questions. I will continue to follow up with the patient to assist them with progression and safety.  Spent 15 min discussing home exercise plan and goals.  Tanisha Lutes [place], [place], ACSM-CEP 06/17/2023 3:07 PM

## 2023-06-19 ENCOUNTER — Encounter (HOSPITAL_COMMUNITY)
Admission: RE | Admit: 2023-06-19 | Discharge: 2023-06-19 | Disposition: A | Discharge status: Home / Self Care | Payer: BC Managed Care – PPO | Source: Ambulatory Visit | Attending: Internal Medicine | Admitting: Internal Medicine

## 2023-06-19 DIAGNOSIS — J449 Chronic obstructive pulmonary disease, unspecified: Secondary | ICD-10-CM | POA: Diagnosis not present

## 2023-06-19 DIAGNOSIS — C3432 Malignant neoplasm of lower lobe, left bronchus or lung: Secondary | ICD-10-CM | POA: Diagnosis not present

## 2023-06-19 NOTE — Progress Notes (Signed)
Daily Session Note  Patient Details  Name: Brandon Phillips. MRN: [id number] Date of Birth: [birth year] Referring Provider:   Doristine Devoid Pulmonary Rehab Walk Test from 04/21/2023 in [facility]  Referring Provider Ramaswamy       Encounter Date: 06/19/2023  Check In:  Session Check In - 06/19/23 1332       Check-In   Supervising physician immediately available to respond to emergencies [facility] MD immediately available    Physician(s) Reather Littler, NP    [facility]    Staff Present Durel Salts, Zella Richer, MS, ACSM-CEP, Exercise Physiologist;Randi Idelle Crouch BS, ACSM-CEP, Exercise Physiologist;Samantha Belarus, RD, Dutch Gray, RN, BSN    Virtual Visit No    Medication changes reported     No    Fall or balance concerns reported    No    Tobacco Cessation No Change    Warm-up and Cool-down Performed as group-led Writer Performed Yes    VAD Patient? No    PAD/SET Patient? No      Pain Assessment   Currently in Pain? No/denies    Multiple Pain Sites No             Capillary Blood Glucose: No results found for this or any previous visit (from the past 24 hour(s)).    Social History   Tobacco Use  Smoking Status Former   Current packs/day: 0.00   Average packs/day: 1.5 packs/day for 35.0 years (52.5 ttl pk-yrs)   Types: Cigarettes   Start date: 03/30/1982   Quit date: 03/30/2017   Years since quitting: 6.2  Smokeless Tobacco Never    Goals Met:  Independence with exercise equipment Exercise tolerated well No report of concerns or symptoms today Strength training completed today  Goals Unmet:  Not Applicable  Comments: Service time is from 1311 to 1435    Dr. Mechele Collin is Medical Director for Pulmonary Rehab at [facility].

## 2023-06-20 DIAGNOSIS — R490 Dysphonia: Secondary | ICD-10-CM | POA: Diagnosis not present

## 2023-06-20 DIAGNOSIS — E785 Hyperlipidemia, unspecified: Secondary | ICD-10-CM | POA: Diagnosis not present

## 2023-06-20 DIAGNOSIS — J449 Chronic obstructive pulmonary disease, unspecified: Secondary | ICD-10-CM | POA: Diagnosis not present

## 2023-06-24 ENCOUNTER — Encounter (HOSPITAL_COMMUNITY)
Admission: RE | Admit: 2023-06-24 | Discharge: 2023-06-24 | Disposition: A | Discharge status: Home / Self Care | Payer: BC Managed Care – PPO | Source: Ambulatory Visit | Attending: Internal Medicine | Admitting: Internal Medicine

## 2023-06-24 VITALS — Wt 176.1 lb

## 2023-06-24 DIAGNOSIS — J449 Chronic obstructive pulmonary disease, unspecified: Secondary | ICD-10-CM

## 2023-06-24 DIAGNOSIS — C3432 Malignant neoplasm of lower lobe, left bronchus or lung: Secondary | ICD-10-CM | POA: Diagnosis not present

## 2023-06-24 NOTE — Progress Notes (Signed)
Daily Session Note  Patient Details  Name: Brandon Phillips. MRN: [id number] Date of Birth: Feb 01, 1966 Referring Provider:   Doristine Devoid Pulmonary Rehab Walk Test from 04/21/2023 in [facility]  Referring Provider Ramaswamy       Encounter Date: 06/24/2023  Check In:  Session Check In - 06/24/23 1323       Check-In   Supervising physician immediately available to respond to emergencies [facility] MD immediately available    Physician(s) Jari Favre, PA    [facility]    Staff Present Durel Salts, Zella Richer, MS, ACSM-CEP, Exercise Physiologist;Randi Dionisio Paschal, ACSM-CEP, Exercise Physiologist;Mary Gerre Scull, RN, BSN    Virtual Visit No    Medication changes reported     No    Fall or balance concerns reported    No    Tobacco Cessation No Change    Warm-up and Cool-down Performed as group-led instruction    Resistance Training Performed Yes    VAD Patient? No    PAD/SET Patient? No      Pain Assessment   Currently in Pain? No/denies    Pain Score 0-No pain    Multiple Pain Sites No             Capillary Blood Glucose: No results found for this or any previous visit (from the past 24 hour(s)).   Exercise Prescription Changes - 06/24/23 1400       Response to Exercise   Blood Pressure (Admit) 110/62    Blood Pressure (Exercise) 146/82    Blood Pressure (Exit) 106/70    Heart Rate (Admit) 102 bpm    Heart Rate (Exercise) 127 bpm    Heart Rate (Exit) 105 bpm    Oxygen Saturation (Admit) 94 %   RA   Oxygen Saturation (Exercise) 88 %   1L   Oxygen Saturation (Exit) 95 %    Rating of Perceived Exertion (Exercise) 13    Perceived Dyspnea (Exercise) 1.5    Duration Continue with 30 min of aerobic exercise without signs/symptoms of physical distress.    Intensity THRR unchanged      Progression   Progression Continue to progress workloads to maintain intensity without  signs/symptoms of physical distress.      Resistance Training   Training Prescription Yes    Weight black bands    Reps 10-15    Time 10 Minutes      Interval Training   Interval Training No      Oxygen   Oxygen Continuous    Liters 1      Recumbant Elliptical   Level 6    RPM 61    Watts 94    Minutes 15    METs 4.4      Rower   Level 2    Watts 48    Minutes 15      Oxygen   Maintain Oxygen Saturation 88% or higher             Social History   Tobacco Use  Smoking Status Former   Current packs/day: 0.00   Average packs/day: 1.5 packs/day for 35.0 years (52.5 ttl pk-yrs)   Types: Cigarettes   Start date: 03/30/1982   Quit date: 03/30/2017   Years since quitting: 6.2  Smokeless Tobacco Never    Goals Met:  Proper associated with RPD/PD & O2 Sat Independence with exercise equipment Exercise tolerated well No report of concerns  or symptoms today Strength training completed today  Goals Unmet:  Not Applicable  Comments: Service time is from 1312 to 1440.    Dr. Mechele Collin is Medical Director for Pulmonary Rehab at [facility].

## 2023-06-25 NOTE — Progress Notes (Signed)
Pulmonary Individual Treatment Plan  Patient Details  Name: Brandon Phillips. MRN: [id number] Date of Birth: 04-May-1966 Referring Provider:   Doristine Devoid Pulmonary Rehab Walk Test from 04/21/2023 in [facility]  Referring Provider Ramaswamy       Initial Encounter Date:  Flowsheet Row Pulmonary Rehab Walk Test from 04/21/2023 in [facility]  Date 04/21/23       Visit Diagnosis: Stage 3 severe COPD by GOLD classification (HCC)  Patient's Home Medications on Admission:   Current Outpatient Medications:    albuterol (PROVENTIL) (2.5 MG/3ML) 0.083% nebulizer solution, Take 3 mLs (2.5 mg total) by nebulization every 6 (six) hours as needed for wheezing or shortness of breath., Disp: 75 mL, Rfl: 5   albuterol (VENTOLIN HFA) 108 (90 Base) MCG/ACT inhaler, Inhale 2 puffs into the lungs every 6 (six) hours as needed for wheezing., Disp: 2 each, Rfl: 11   apixaban (ELIQUIS) 2.5 MG TABS tablet, Take 1 tablet (2.5 mg total) by mouth 2 (two) times daily., Disp: 60 tablet, Rfl: 12   atorvastatin (LIPITOR) 20 MG tablet, TAKE ONE TABLET BY MOUTH DAILY, Disp: 90 tablet, Rfl: 3   benzonatate (TESSALON) 200 MG capsule, Take 1 capsule (200 mg total) by mouth 3 (three) times daily as needed for cough., Disp: 30 capsule, Rfl: 1   Budeson-Glycopyrrol-Formoterol (BREZTRI AEROSPHERE) 160-9-4.8 MCG/ACT AERO, Inhale 2 puffs into the lungs in the morning and at bedtime., Disp: 1 each, Rfl: 11   COENZYME Q10 PO, Take 100 mg by mouth in the morning., Disp: , Rfl:    gabapentin (NEURONTIN) 300 MG capsule, Take 1 capsule (300 mg total) by mouth 2 (two) times daily. TAKE 1 CAPSULE BY MOUTH EVERY NIGHT AT BEDTIME FOR 3 NIGHTS, THEN INCREASE TO TAKE 1 CAPSULE BY MOUTH 2 TIMES A DAY FOR 1 WEEK, THEN OK TO INCREASE TO TAKE 1 CAPSULE BY MOUTH 3 TIMES A DAY, Disp: 90 capsule, Rfl: 3   GUAIFENESIN 1200  PO, Take 1,200 mg by mouth in the morning and at bedtime., Disp: , Rfl:    lidocaine-prilocaine (EMLA) cream, Apply a dime size to port-a-cath 1-2 hours prior to access. Cover with saran wrap., Disp: 30 g, Rfl: 2   Multiple Vitamin (MULTIVITAMIN) capsule, Take 1 capsule by mouth daily., Disp: , Rfl:    telmisartan (MICARDIS) 20 MG tablet, Take 1 tablet (20 mg total) by mouth daily., Disp: 30 tablet, Rfl: 2  Past Medical History: Past Medical History:  Diagnosis Date   Cancer (HCC)    lung cancer   CAP (community acquired pneumonia)    COPD (chronic obstructive pulmonary disease) (HCC)    Eczema    HLD (hyperlipidemia)    Primary cancer of left lower lobe of lung (HCC) 09/17/2022   Tobacco use disorder 01/24/2017    Tobacco Use: Social History   Tobacco Use  Smoking Status Former   Current packs/day: 0.00   Average packs/day: 1.5 packs/day for 35.0 years (52.5 ttl pk-yrs)   Types: Cigarettes   Start date: 03/30/1982   Quit date: 03/30/2017   Years since quitting: 6.2  Smokeless Tobacco Never    Labs: Review Flowsheet       Latest Ref Rng & Units 02/10/2020 04/06/2020 01/11/2022  Labs for ITP Cardiac and Pulmonary Rehab  Cholestrol <200 mg/dL 510  258  527   LDL (calc) mg/dL (calc) 782  92  88  HDL-C > OR = 40 mg/dL 50  38  47   Trlycerides <150 mg/dL 960  454  90     Details            Capillary Blood Glucose: Lab Results  Component Value Date   GLUCAP 95 01/01/2023   GLUCAP 113 (H) 08/14/2022     Pulmonary Assessment Scores:  Pulmonary Assessment Scores     Row Name 04/21/23 1328         ADL UCSD   ADL Phase Entry     SOB Score total 40       CAT Score   CAT Score 17       mMRC Score   mMRC Score 2             UCSD: Self-administered rating of dyspnea associated with activities of daily living (ADLs) 6-point scale (0 = "not at all" to 5 = "maximal or unable to do because of breathlessness")  Scoring Scores range from 0 to 120.  Minimally  important difference is 5 units  CAT: CAT can identify the health impairment of COPD patients and is better correlated with disease progression.  CAT has a scoring range of zero to 40. The CAT score is classified into four groups of low (less than 10), medium (10 - 20), high (21-30) and very high (31-40) based on the impact level of disease on health status. A CAT score over 10 suggests significant symptoms.  A worsening CAT score could be explained by an exacerbation, poor medication adherence, poor inhaler technique, or progression of COPD or comorbid conditions.  CAT MCID is 2 points  mMRC: mMRC (Modified Medical Research Council) Dyspnea Scale is used to assess the degree of baseline functional disability in patients of respiratory disease due to dyspnea. No minimal important difference is established. A decrease in score of 1 point or greater is considered a positive change.   Pulmonary Function Assessment:  Pulmonary Function Assessment - 04/21/23 1338       Breath   Shortness of Breath No             Exercise Target Goals: Exercise Program Goal: Individual exercise prescription set using results from initial 6 min walk test and THRR while considering  patient's activity barriers and safety.   Exercise Prescription Goal: Initial exercise prescription builds to 30-45 minutes a day of aerobic activity, 2-3 days per week.  Home exercise guidelines will be given to patient during program as part of exercise prescription that the participant will acknowledge.  Activity Barriers & Risk Stratification:  Activity Barriers & Cardiac Risk Stratification - 04/21/23 1338       Activity Barriers & Cardiac Risk Stratification   Activity Barriers Shortness of Breath;Muscular Weakness;Deconditioning    Cardiac Risk Stratification Moderate             6 Minute Walk:  6 Minute Walk     Row Name 04/21/23 1432         6 Minute Walk   Phase Initial     Distance 1060 feet      Walk Time 6 minutes     # of Rest Breaks 0     MPH 2.01     METS 3.03     RPE 11     Perceived Dyspnea  1     VO2 Peak 10.59     Symptoms No     Resting HR 86 bpm     Resting BP 122/76  Resting Oxygen Saturation  90 %     Exercise Oxygen Saturation  during 6 min walk 86 %     Max Ex. HR 99 bpm     Max Ex. BP 108/60     2 Minute Post BP 106/70       Interval HR   1 Minute HR 94     2 Minute HR 96     3 Minute HR 96     4 Minute HR 97     5 Minute HR 96     6 Minute HR 99     2 Minute Post HR 97     Interval Heart Rate? Yes       Interval Oxygen   Interval Oxygen? Yes     Baseline Oxygen Saturation % 90 %     1 Minute Oxygen Saturation % 90 %     1 Minute Liters of Oxygen 0 L     2 Minute Oxygen Saturation % 87 %  lowest 86 RA, applied 1L     2 Minute Liters of Oxygen 0 L     3 Minute Oxygen Saturation % 88 %     3 Minute Liters of Oxygen 1 L     4 Minute Oxygen Saturation % 89 %     4 Minute Liters of Oxygen 1 L     5 Minute Oxygen Saturation % 89 %     5 Minute Liters of Oxygen 1 L     6 Minute Oxygen Saturation % 90 %     6 Minute Liters of Oxygen 1 L     2 Minute Post Oxygen Saturation % 96 %     2 Minute Post Liters of Oxygen 1 L              Oxygen Initial Assessment:  Oxygen Initial Assessment - 04/21/23 1329       Home Oxygen   Home Oxygen Device Home Concentrator;Portable Concentrator    Sleep Oxygen Prescription Continuous    Liters per minute 2.5    Home Exercise Oxygen Prescription Pulsed    Liters per minute 3    Home Resting Oxygen Prescription None    Compliance with Home Oxygen Use Yes      Initial 6 min Walk   Oxygen Used Continuous    Liters per minute 1      Program Oxygen Prescription   Program Oxygen Prescription Continuous    Liters per minute 1      Intervention   Short Term Goals To learn and exhibit compliance with exercise, home and travel O2 prescription;To learn and understand importance of maintaining oxygen  saturations>88%;To learn and demonstrate proper use of respiratory medications;To learn and understand importance of monitoring SPO2 with pulse oximeter and demonstrate accurate use of the pulse oximeter.;To learn and demonstrate proper pursed lip breathing techniques or other breathing techniques. ;Other    Long  Term Goals Other;Demonstrates proper use of MDI's;Compliance with respiratory medication;Exhibits proper breathing techniques, such as pursed lip breathing or other method taught during program session;Maintenance of O2 saturations>88%;Verbalizes importance of monitoring SPO2 with pulse oximeter and return demonstration;Exhibits compliance with exercise, home  and travel O2 prescription             Oxygen Re-Evaluation:  Oxygen Re-Evaluation     Row Name 04/22/23 0839 05/26/23 0949 06/17/23 0840         Program Oxygen Prescription   Program Oxygen Prescription Continuous Continuous Continuous  Liters per minute 1 1 1      Comments pt to begin exercise 10/1 -- attempted RA last session. 88 on recumbent elliptical but 85 on rower       Home Oxygen   Home Oxygen Device Home Concentrator;Portable Concentrator Home Concentrator;Portable Concentrator Home Concentrator;Portable Concentrator     Sleep Oxygen Prescription Continuous Continuous Continuous     Liters per minute 2.5 2.5 2.5     Home Exercise Oxygen Prescription Pulsed Pulsed Pulsed     Liters per minute 3 3 3      Home Resting Oxygen Prescription None None None     Compliance with Home Oxygen Use Yes Yes Yes       Goals/Expected Outcomes   Short Term Goals To learn and exhibit compliance with exercise, home and travel O2 prescription;To learn and understand importance of maintaining oxygen saturations>88%;To learn and demonstrate proper use of respiratory medications;To learn and understand importance of monitoring SPO2 with pulse oximeter and demonstrate accurate use of the pulse oximeter.;To learn and demonstrate  proper pursed lip breathing techniques or other breathing techniques. ;Other To learn and exhibit compliance with exercise, home and travel O2 prescription;To learn and understand importance of maintaining oxygen saturations>88%;To learn and demonstrate proper use of respiratory medications;To learn and understand importance of monitoring SPO2 with pulse oximeter and demonstrate accurate use of the pulse oximeter.;To learn and demonstrate proper pursed lip breathing techniques or other breathing techniques. ;Other To learn and exhibit compliance with exercise, home and travel O2 prescription;To learn and understand importance of maintaining oxygen saturations>88%;To learn and demonstrate proper use of respiratory medications;To learn and understand importance of monitoring SPO2 with pulse oximeter and demonstrate accurate use of the pulse oximeter.;To learn and demonstrate proper pursed lip breathing techniques or other breathing techniques. ;Other     Long  Term Goals Other;Demonstrates proper use of MDI's;Compliance with respiratory medication;Exhibits proper breathing techniques, such as pursed lip breathing or other method taught during program session;Maintenance of O2 saturations>88%;Verbalizes importance of monitoring SPO2 with pulse oximeter and return demonstration;Exhibits compliance with exercise, home  and travel O2 prescription Other;Demonstrates proper use of MDI's;Compliance with respiratory medication;Exhibits proper breathing techniques, such as pursed lip breathing or other method taught during program session;Maintenance of O2 saturations>88%;Verbalizes importance of monitoring SPO2 with pulse oximeter and return demonstration;Exhibits compliance with exercise, home  and travel O2 prescription Other;Demonstrates proper use of MDI's;Compliance with respiratory medication;Exhibits proper breathing techniques, such as pursed lip breathing or other method taught during program session;Maintenance of  O2 saturations>88%;Verbalizes importance of monitoring SPO2 with pulse oximeter and return demonstration;Exhibits compliance with exercise, home  and travel O2 prescription     Goals/Expected Outcomes Compliance and understanding of oxygen saturation monitoring and breathing techniques to decrease shortness of breath Compliance and understanding of oxygen saturation monitoring and breathing techniques to decrease shortness of breath Compliance and understanding of oxygen saturation monitoring and breathing techniques to decrease shortness of breath              Oxygen Discharge (Final Oxygen Re-Evaluation):  Oxygen Re-Evaluation - 06/17/23 0840       Program Oxygen Prescription   Program Oxygen Prescription Continuous    Liters per minute 1    Comments attempted RA last session. 88 on recumbent elliptical but 85 on rower      Home Oxygen   Home Oxygen Device Home Concentrator;Portable Concentrator    Sleep Oxygen Prescription Continuous    Liters per minute 2.5    Home Exercise Oxygen Prescription Pulsed  Liters per minute 3    Home Resting Oxygen Prescription None    Compliance with Home Oxygen Use Yes      Goals/Expected Outcomes   Short Term Goals To learn and exhibit compliance with exercise, home and travel O2 prescription;To learn and understand importance of maintaining oxygen saturations>88%;To learn and demonstrate proper use of respiratory medications;To learn and understand importance of monitoring SPO2 with pulse oximeter and demonstrate accurate use of the pulse oximeter.;To learn and demonstrate proper pursed lip breathing techniques or other breathing techniques. ;Other    Long  Term Goals Other;Demonstrates proper use of MDI's;Compliance with respiratory medication;Exhibits proper breathing techniques, such as pursed lip breathing or other method taught during program session;Maintenance of O2 saturations>88%;Verbalizes importance of monitoring SPO2 with pulse oximeter  and return demonstration;Exhibits compliance with exercise, home  and travel O2 prescription    Goals/Expected Outcomes Compliance and understanding of oxygen saturation monitoring and breathing techniques to decrease shortness of breath             Initial Exercise Prescription:  Initial Exercise Prescription - 04/21/23 1400       Date of Initial Exercise RX and Referring Provider   Date 04/21/23    Referring Provider Ramaswamy    Expected Discharge Date 04/21/23      Oxygen   Oxygen Continuous    Liters 1    Maintain Oxygen Saturation 88% or higher      Treadmill   MPH 2    Grade 0    Minutes 15    METs 3      Recumbant Elliptical   Level 3    RPM 70    Minutes 15    METs 3      Prescription Details   Frequency (times per week) 2    Duration Progress to 30 minutes of continuous aerobic without signs/symptoms of physical distress      Intensity   THRR 40-80% of Max Heartrate 66-132    Ratings of Perceived Exertion 11-13    Perceived Dyspnea 0-4      Progression   Progression Continue to progress workloads to maintain intensity without signs/symptoms of physical distress.      Resistance Training   Training Prescription Yes    Weight blue bands    Reps 10-15             Perform Capillary Blood Glucose checks as needed.  Exercise Prescription Changes:   Exercise Prescription Changes     Row Name 04/29/23 1500 05/13/23 1500 05/27/23 1500 06/05/23 1344 06/10/23 1300     Response to Exercise   Blood Pressure (Admit) 102/62 120/60 98/58 126/58 --   Blood Pressure (Exercise) 144/88 154/72 140/58 -- --   Blood Pressure (Exit) 132/64 112/68 104/66 120/64 --   Heart Rate (Admit) 108 bpm 107 bpm 102 bpm 104 bpm --   Heart Rate (Exercise) 127 bpm 116 bpm 116 bpm 131 bpm --   Heart Rate (Exit) 118 bpm 103 bpm 102 bpm 106 bpm --   Oxygen Saturation (Admit) 89 % 94 % 93 % 92 % --   Oxygen Saturation (Exercise) 88 % 93 % 90 % 90 % --   Oxygen Saturation  (Exit) 93 % 93 % 94 % 92 % --   Rating of Perceived Exertion (Exercise) 14 13 13 15  --   Perceived Dyspnea (Exercise) 2 2 1 2  --   Duration Continue with 30 min of aerobic exercise without signs/symptoms of physical distress. Continue with  30 min of aerobic exercise without signs/symptoms of physical distress. Continue with 30 min of aerobic exercise without signs/symptoms of physical distress. Continue with 30 min of aerobic exercise without signs/symptoms of physical distress. --   Intensity THRR unchanged THRR unchanged THRR unchanged THRR unchanged --     Progression   Progression Continue to progress workloads to maintain intensity without signs/symptoms of physical distress. Continue to progress workloads to maintain intensity without signs/symptoms of physical distress. Continue to progress workloads to maintain intensity without signs/symptoms of physical distress. Continue to progress workloads to maintain intensity without signs/symptoms of physical distress. --     Paramedic Prescription Yes Yes Yes Yes --   Weight black bands black bands black bands black bands --   Reps 10-15 10-15 10-15 10-15 --   Time 10 Minutes 10 Minutes 10 Minutes 10 Minutes --     Interval Training   Interval Training -- -- No -- --     Oxygen   Oxygen Continuous Continuous Continuous Continuous --   Liters 3 2-3 1 1  --     Treadmill   MPH 2 -- -- -- --   Grade 0 -- -- -- --   Minutes 15 -- -- -- --   METs 2.4 -- -- -- --     Recumbant Elliptical   Level 3 4 4 4  --   RPM 70 -- -- -- --   Minutes 15 15 15 15  --   METs 2 3.2 3.4 3.4 --     Rower   Level -- 3 2 2  --   Watts -- 25 31 41 --   Minutes -- 15 15 15  --     Oxygen   Maintain Oxygen Saturation -- 88% or higher 88% or higher 88% or higher --    Row Name 06/17/23 1500 06/24/23 1400           Response to Exercise   Blood Pressure (Admit) -- 110/62      Blood Pressure (Exercise) -- 146/82      Blood Pressure  (Exit) -- 106/70      Heart Rate (Admit) -- 102 bpm      Heart Rate (Exercise) -- 127 bpm      Heart Rate (Exit) -- 105 bpm      Oxygen Saturation (Admit) -- 94 %  RA      Oxygen Saturation (Exercise) -- 88 %  1L      Oxygen Saturation (Exit) -- 95 %      Rating of Perceived Exertion (Exercise) -- 13      Perceived Dyspnea (Exercise) -- 1.5      Duration -- Continue with 30 min of aerobic exercise without signs/symptoms of physical distress.      Intensity -- THRR unchanged        Progression   Progression -- Continue to progress workloads to maintain intensity without signs/symptoms of physical distress.        Resistance Training   Training Prescription -- Yes      Weight -- black bands      Reps -- 10-15      Time -- 10 Minutes        Interval Training   Interval Training -- No        Oxygen   Oxygen -- Continuous      Liters -- 1        Recumbant Elliptical   Level -- 6  RPM -- 61      Watts -- 94      Minutes -- 15      METs -- 4.4        Rower   Level -- 2      Watts -- 48      Minutes -- 15        Home Exercise Plan   Plans to continue exercise at Home (comment)  walking --      Frequency Add 1 additional day to program exercise sessions. --      Initial Home Exercises Provided 06/17/23 --        Oxygen   Maintain Oxygen Saturation -- 88% or higher               Exercise Comments:   Exercise Comments     Row Name 04/29/23 1512 06/17/23 1504         Exercise Comments Pt completed first day of group exercise. He exercised on the recumbent elliptical for 15 min, level 3, METs 2.0. He then walked on the treadmill for 15 min, 2.0 mph, 0 incline, METs 2.4. Pt tolerated exercise well and performed warm up and cool down unlimited. We discussed METs with good perception. Discussed with pt home exercise plan. He is currently not doing cardiovascular exercise but is doing the resistance bands. Discussed adding one nonrehab day of walking 30 min or more.  He is also interested in joining [employer] soon.               Exercise Goals and Review:   Exercise Goals     Row Name 04/21/23 1339             Exercise Goals   Increase Physical Activity Yes       Intervention Provide advice, education, support and counseling about physical activity/exercise needs.;Develop an individualized exercise prescription for aerobic and resistive training based on initial evaluation findings, risk stratification, comorbidities and participant's personal goals.       Expected Outcomes Short Term: Attend rehab on a regular basis to increase amount of physical activity.;Long Term: Exercising regularly at least 3-5 days a week.;Long Term: Add in home exercise to make exercise part of routine and to increase amount of physical activity.       Increase Strength and Stamina Yes       Intervention Provide advice, education, support and counseling about physical activity/exercise needs.;Develop an individualized exercise prescription for aerobic and resistive training based on initial evaluation findings, risk stratification, comorbidities and participant's personal goals.       Expected Outcomes Short Term: Increase workloads from initial exercise prescription for resistance, speed, and METs.;Short Term: Perform resistance training exercises routinely during rehab and add in resistance training at home;Long Term: Improve cardiorespiratory fitness, muscular endurance and strength as measured by increased METs and functional capacity ( )       Able to understand and use rate of perceived exertion (RPE) scale Yes       Intervention Provide education and explanation on how to use RPE scale       Expected Outcomes Short Term: Able to use RPE daily in rehab to express subjective intensity level;Long Term:  Able to use RPE to guide intensity level when exercising independently       Able to understand and use Dyspnea scale Yes       Intervention Provide education and  explanation on how to use Dyspnea scale  Expected Outcomes Short Term: Able to use Dyspnea scale daily in rehab to express subjective sense of shortness of breath during exertion;Long Term: Able to use Dyspnea scale to guide intensity level when exercising independently       Knowledge and understanding of Target Heart Rate Range (THRR) Yes       Intervention Provide education and explanation of THRR including how the numbers were predicted and where they are located for reference       Expected Outcomes Short Term: Able to state/look up THRR;Long Term: Able to use THRR to govern intensity when exercising independently;Short Term: Able to use daily as guideline for intensity in rehab       Understanding of Exercise Prescription Yes       Intervention Provide education, explanation, and written materials on patient's individual exercise prescription       Expected Outcomes Short Term: Able to explain program exercise prescription;Long Term: Able to explain home exercise prescription to exercise independently                Exercise Goals Re-Evaluation :  Exercise Goals Re-Evaluation     Row Name 04/22/23 0831 05/26/23 0938 06/17/23 0838         Exercise Goal Re-Evaluation   Exercise Goals Review Increase Physical Activity;Able to understand and use Dyspnea scale;Understanding of Exercise Prescription;Increase Strength and Stamina;Knowledge and understanding of Target Heart Rate Range (THRR);Able to understand and use rate of perceived exertion (RPE) scale Increase Physical Activity;Able to understand and use Dyspnea scale;Understanding of Exercise Prescription;Increase Strength and Stamina;Knowledge and understanding of Target Heart Rate Range (THRR);Able to understand and use rate of perceived exertion (RPE) scale Increase Physical Activity;Able to understand and use Dyspnea scale;Understanding of Exercise Prescription;Increase Strength and Stamina;Knowledge and understanding of Target  Heart Rate Range (THRR);Able to understand and use rate of perceived exertion (RPE) scale     Comments Pt to begin exercise 10/1. Will progress as tolerated. Brandon Phillips has completed 7 exercise sessions. He exercises for 15 min on the recumbent elliptical and rower. Brandon Phillips averages 3.4 METs at level 4 on the recumbent elliptical and 29 watts at level 2 on the rower. He performs the warmup and cooldown standing without limitations. Brandon Phillips has been transferred from the treadmill to the rower due to hip pain. He tolerates the rower better. Brandon Phillips has increased his workload for the recumbent elliptical as he tolerates progressions well. Will continue to monitor and progress as able. Brandon Phillips has completed 12 exercise sessions. He exercises for 15 min on the recumbent elliptical and rower. Brandon Phillips has progressed to 4.5 METs at level 5 on the recumbent elliptical and 34 watts at level 2 on the rower. He is motivated and progressing. The rower is exceptionally challenging for him. Will continue to monitor and progress as able.     Expected Outcomes Through exercise at rehab and home, the patient will decrease shortness of breath with daily activities and feel confident in carrying out an exercise regimen at home Through exercise at rehab and home, the patient will decrease shortness of breath with daily activities and feel confident in carrying out an exercise regimen at home Through exercise at rehab and home, the patient will decrease shortness of breath with daily activities and feel confident in carrying out an exercise regimen at home              Discharge Exercise Prescription (Final Exercise Prescription Changes):  Exercise Prescription Changes - 06/24/23 1400  Response to Exercise   Blood Pressure (Admit) 110/62    Blood Pressure (Exercise) 146/82    Blood Pressure (Exit) 106/70    Heart Rate (Admit) 102 bpm    Heart Rate (Exercise) 127 bpm    Heart Rate (Exit) 105 bpm    Oxygen Saturation (Admit) 94 %   RA    Oxygen Saturation (Exercise) 88 %   1L   Oxygen Saturation (Exit) 95 %    Rating of Perceived Exertion (Exercise) 13    Perceived Dyspnea (Exercise) 1.5    Duration Continue with 30 min of aerobic exercise without signs/symptoms of physical distress.    Intensity THRR unchanged      Progression   Progression Continue to progress workloads to maintain intensity without signs/symptoms of physical distress.      Resistance Training   Training Prescription Yes    Weight black bands    Reps 10-15    Time 10 Minutes      Interval Training   Interval Training No      Oxygen   Oxygen Continuous    Liters 1      Recumbant Elliptical   Level 6    RPM 61    Watts 94    Minutes 15    METs 4.4      Rower   Level 2    Watts 48    Minutes 15      Oxygen   Maintain Oxygen Saturation 88% or higher             Nutrition:  Target Goals: Understanding of nutrition guidelines, daily intake of sodium 1500mg , cholesterol 200mg , calories 30% from fat and 7% or less from saturated fats, daily to have 5 or more servings of fruits and vegetables.  Biometrics:  Pre Biometrics - 04/29/23 1515       Pre Biometrics   Grip Strength 42 kg              Nutrition Therapy Plan and Nutrition Goals:  Nutrition Therapy & Goals - 06/23/23 1056       Nutrition Therapy   Diet General Healthy Diet    Drug/Food Interactions Statins/Certain Fruits      Personal Nutrition Goals   Nutrition Goal Patient to identify strategies for weight loss of 0.5-2.0# per week.   goal in progress.   Personal Goal #2 Patient to improve diet quality by using the plate method as a guide for meal planning to include lean protein/plant protein, fruits, vegetables, whole grains, nonfat dairy as part of a well-balanced diet.   goal in progress.   Comments Goals in progress. Brandon Phillips reports motivation to lose weight. He is up ~20# since June but he reports weight gain since starting cancer treatment. He is down  2.4# since starting with our program. 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 He is packing lunch daily for work and cooking dinner meal at home. We have discussed multiple strategies for weight loss including including calorie density, the plate method as a guide for meal planning, eliminating sugary drinks, protein drinks, etc. His wife is a good support. He has returned back to work full time. Patient will benefit from participation in pulmonary rehab for nutrition, exercise, and lifestyle modification.      Intervention Plan   Intervention Prescribe, educate and counsel regarding individualized specific dietary modifications aiming towards targeted core components such as weight, hypertension, lipid management, diabetes,  heart failure and other comorbidities.;Nutrition handout(s) given to patient.    Expected Outcomes Short Term Goal: Understand basic principles of dietary content, such as calories, fat, sodium, cholesterol and nutrients.;Long Term Goal: Adherence to prescribed nutrition plan.             Nutrition Assessments:  Nutrition Assessments - 04/29/23 1509       Rate Your Plate Scores   Pre Score 41            MEDIFICTS Score Key: >=70 Need to make dietary changes  40-70 Heart Healthy Diet <= 40 Therapeutic Level Cholesterol Diet  Flowsheet Row PULMONARY REHAB CHRONIC OBSTRUCTIVE PULMONARY DISEASE from 04/29/2023 in [facility]  Picture Your Plate Total Score on Admission 41      Picture Your Plate Scores: <16 Unhealthy dietary pattern with much room for improvement. 41-50 Dietary pattern unlikely to meet recommendations for good health and room for improvement. 51-60 More healthful dietary pattern, with some room for improvement.  >60 Healthy dietary pattern, although there may be some specific behaviors that could  be improved.    Nutrition Goals Re-Evaluation:  Nutrition Goals Re-Evaluation     Row Name 04/29/23 1412 05/26/23 1133 06/23/23 1056         Goals   Current Weight 180 lb 8.9 oz (81.9 kg) 180 lb 8.9 oz (81.9 kg) 176 lb 9.4 oz (80.1 kg)     Comment lipids WNL, LDL 88, CBC WNL no new labs; most recent labs  lipids WNL, LDL 88, CBC WNL no new labs; most recent labs lipids WNL, LDL 88, CBC WNL     Expected Outcome Brandon Phillips reports motivation to lose weight. He is up ~20# since June 2024 but he reports weight gain since starting cancer treatment. 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 His wife is a good support. Patient will benefit from participation in pulmonary rehab for nutrition, exercise, and lifestyle modification. Goal not met. Brandon Phillips reports motivation to lose weight. He is up ~20# since June but he reports weight gain since starting cancer treatment. He is up 1.5# since starting with our program. 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 His wife is a good support. He has returned back to work full time. Patient will benefit from participation in pulmonary rehab for nutrition, exercise, and lifestyle modification. Goals in progress. Brandon Phillips reports motivation to lose weight. He is up ~20# since June but he reports weight gain since starting cancer treatment. He is down 2.4# since starting with our program. 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 He is packing lunch daily for work and cooking dinner meal at home. We have discussed multiple strategies for weight loss including including calorie density, the plate method as a guide for meal planning, eliminating sugary drinks, protein drinks, etc. His wife is a good support. He has  returned back to work full time. Patient will benefit from participation in pulmonary rehab for nutrition, exercise, and lifestyle modification.              Nutrition Goals Discharge (Final Nutrition Goals Re-Evaluation):  Nutrition Goals Re-Evaluation - 06/23/23 1056       Goals   Current Weight 176 lb 9.4 oz (80.1 kg)    Comment no new labs;  most recent labs lipids WNL, LDL 88, CBC WNL    Expected Outcome Goals in progress. Brandon Phillips reports motivation to lose weight. He is up ~20# since June but he reports weight gain since starting cancer treatment. He is down 2.4# since starting with our program. 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 He is packing lunch daily for work and cooking dinner meal at home. We have discussed multiple strategies for weight loss including including calorie density, the plate method as a guide for meal planning, eliminating sugary drinks, protein drinks, etc. His wife is a good support. He has returned back to work full time. Patient will benefit from participation in pulmonary rehab for nutrition, exercise, and lifestyle modification.             Psychosocial: Target Goals: Acknowledge presence or absence of significant depression and/or stress, maximize coping skills, provide positive support system. Participant is able to verbalize types and ability to use techniques and skills needed for reducing stress and depression.  Initial Review & Psychosocial Screening:  Initial Psych Review & Screening - 04/21/23 1331       Family Dynamics   Good Support System? Yes    Comments [name] wife      Barriers   Psychosocial barriers to participate in program There are no identifiable barriers or psychosocial needs.;The patient should benefit from training in stress management and relaxation.      Screening Interventions   Interventions Encouraged to exercise    Expected  Outcomes Short Term goal: Utilizing psychosocial counselor, staff and physician to assist with identification of specific Stressors or current issues interfering with healing process. Setting desired goal for each stressor or current issue identified.;Long Term Goal: Stressors or current issues are controlled or eliminated.;Short Term goal: Identification and review with participant of any Quality of Life or Depression concerns found by scoring the questionnaire.;Long Term goal: The participant improves quality of Life and PHQ9 Scores as seen by post scores and/or verbalization of changes             Quality of Life Scores:  Scores of 19 and below usually indicate a poorer quality of life in these areas.  A difference of  2-3 points is a clinically meaningful difference.  A difference of 2-3 points in the total score of the Quality of Life Index has been associated with significant improvement in overall quality of life, self-image, physical symptoms, and general health in studies assessing change in quality of life.  PHQ-9: Review Flowsheet  More data exists      04/21/2023 04/16/2023 09/05/2022 01/04/2022 12/27/2020  Depression screen PHQ 2/9  Decreased Interest 0 0 0 0 0 0  Down, Depressed, Hopeless 0 0 0 0 0 0  PHQ - 2 Score 0 0 0 0 0 0  Altered sleeping 0 - - - -  Tired, decreased energy 0 - - - -  Change in appetite 0 - - - -  Feeling bad or failure about yourself  1 - - - -  Trouble concentrating 0 - - - -  Moving slowly or fidgety/restless 0 - - - -  Suicidal thoughts 0 - - - -  PHQ-9 Score 1 - - - -  Difficult doing work/chores Not difficult at all - - - -    Details       Multiple values from one day are sorted in reverse-chronological order  Interpretation of Total Score  Total Score Depression Severity:  1-4 = Minimal depression, 5-9 = Mild depression, 10-14 = Moderate depression, 15-19 = Moderately severe depression, 20-27 = Severe depression   Psychosocial  Evaluation and Intervention:  Psychosocial Evaluation - 04/21/23 1332       Psychosocial Evaluation & Interventions   Interventions Encouraged to exercise with the program and follow exercise prescription    Expected Outcomes For pt to participate in PR    Continue Psychosocial Services  No Follow up required             Psychosocial Re-Evaluation:  Psychosocial Re-Evaluation     Row Name 04/23/23 1133 05/21/23 0823 06/20/23 1109         Psychosocial Re-Evaluation   Current issues with None Identified None Identified None Identified     Comments No changes since orientation. Brandon Phillips is scheduled to start the program on 04/29/2023 At time of re-evaluation Brandon Phillips still denies any psychosocial issues. He denies any needs at this time and states he has good support from his wife and family. Core components/risk factors/patient goals monthly re-evaluate are as follows: Brandon Phillips still denies any psychosocial issues. He denies any needs at this time and states he has good support from his wife and family. He is back to work and confident with his progress.     Expected Outcomes For Brandon Phillips to attend PR free of psychosocial barriers or concerns. For Brandon Phillips to continue to attend PR free of psychosocial barriers or concerns. For Brandon Phillips to continue to attend PR free of psychosocial barriers or concerns.     Interventions Encouraged to attend Pulmonary Rehabilitation for the exercise Encouraged to attend Pulmonary Rehabilitation for the exercise Encouraged to attend Pulmonary Rehabilitation for the exercise     Continue Psychosocial Services  No Follow up required No Follow up required No Follow up required              Psychosocial Discharge (Final Psychosocial Re-Evaluation):  Psychosocial Re-Evaluation - 06/20/23 1109       Psychosocial Re-Evaluation   Current issues with None Identified    Comments Core components/risk factors/patient goals monthly re-evaluate are as follows: Brandon Phillips still denies any  psychosocial issues. He denies any needs at this time and states he has good support from his wife and family. He is back to work and confident with his progress.    Expected Outcomes For Brandon Phillips to continue to attend PR free of psychosocial barriers or concerns.    Interventions Encouraged to attend Pulmonary Rehabilitation for the exercise    Continue Psychosocial Services  No Follow up required             Education: Education Goals: Education classes will be provided on a weekly basis, covering required topics. Participant will state understanding/return demonstration of topics presented.  Learning Barriers/Preferences:  Learning Barriers/Preferences - 04/21/23 1332       Learning Barriers/Preferences   Learning Barriers Hearing   mild hearing loss   Learning Preferences Video;Individual Instruction;Written Material             Education Topics: Know Your Numbers Group instruction that is supported by a PowerPoint presentation. Instructor discusses importance of knowing and understanding resting, exercise, and post-exercise oxygen saturation, heart rate, and blood pressure. Oxygen saturation, heart rate, blood pressure, rating of perceived exertion, and dyspnea are reviewed along with a normal range for these values.    Exercise for the Pulmonary Patient Group instruction that is supported by a PowerPoint presentation.  Instructor discusses benefits of exercise, core components of exercise, frequency, duration, and intensity of an exercise routine, importance of utilizing pulse oximetry during exercise, safety while exercising, and options of places to exercise outside of rehab.    MET Level  Group instruction provided by PowerPoint, verbal discussion, and written material to support subject matter. Instructor reviews what METs are and how to increase METs.  Flowsheet Row PULMONARY REHAB CHRONIC OBSTRUCTIVE PULMONARY DISEASE from 06/19/2023 in [facility], [facility], [facility]  Date 06/19/23  Educator EP  Instruction Review Code 1- Verbalizes Understanding       Pulmonary Medications Verbally interactive group education provided by instructor with focus on inhaled medications and proper administration.   Anatomy and Physiology of the Respiratory System Group instruction provided by PowerPoint, verbal discussion, and written material to support subject matter. Instructor reviews respiratory cycle and anatomical components of the respiratory system and their functions. Instructor also reviews differences in obstructive and restrictive respiratory diseases with examples of each.    Oxygen Safety Group instruction provided by PowerPoint, verbal discussion, and written material to support subject matter. There is an overview of "What is Oxygen" and "Why do we need it".  Instructor also reviews how to create a safe environment for oxygen use, the importance of using oxygen as prescribed, and the risks of noncompliance. There is a brief discussion on traveling with oxygen and resources the patient may utilize. Flowsheet Row PULMONARY REHAB CHRONIC OBSTRUCTIVE PULMONARY DISEASE from 05/08/2023 in [facility], [facility], [facility]  Date 05/08/23  Educator RN  Instruction Review Code 1- Verbalizes Understanding       Oxygen Use Group instruction provided by PowerPoint, verbal discussion, and written material to discuss how supplemental oxygen is prescribed and different types of oxygen supply systems. Resources for more information are provided.  Flowsheet Row PULMONARY REHAB CHRONIC OBSTRUCTIVE PULMONARY DISEASE from 05/15/2023 in [facility] for Heart, Vascular, & Lung Health  Date 05/15/23  Educator RT  Instruction Review Code 1- Verbalizes Understanding       Breathing Techniques Group instruction that is supported by demonstration and informational  handouts. Instructor discusses the benefits of pursed lip and diaphragmatic breathing and detailed demonstration on how to perform both.     Risk Factor Reduction Group instruction that is supported by a PowerPoint presentation. Instructor discusses the definition of a risk factor, different risk factors for pulmonary disease, and how the heart and lungs work together. Flowsheet Row PULMONARY REHAB CHRONIC OBSTRUCTIVE PULMONARY DISEASE from 06/12/2023 in [facility], Vascular, [facility] Lung [facility]  Date 06/12/23  Educator EP  Instruction Review Code 1- Verbalizes Understanding       Pulmonary Diseases Group instruction provided by PowerPoint, verbal discussion, and written material to support subject matter. Instructor gives an overview of the different type of pulmonary diseases. There is also a discussion on risk factors and symptoms as well as ways to manage the diseases.   Stress and Energy Conservation Group instruction provided by PowerPoint, verbal discussion, and written material to support subject matter. Instructor gives an overview of stress and the impact it can have on the body. Instructor also reviews ways to reduce stress. There is also a discussion on energy conservation and ways to conserve energy throughout the day. Flowsheet Row PULMONARY REHAB CHRONIC OBSTRUCTIVE PULMONARY DISEASE from 05/29/2023 in [facility] for Heart, Vascular, & Lung Health  Date 05/29/23  Educator RN  Instruction Review Code 1- Verbalizes Understanding       Warning Signs and Symptoms Group instruction provided by PowerPoint, verbal discussion, and written material to support subject matter. Instructor reviews warning signs and symptoms of stroke, heart attack, cold and flu. Instructor also reviews ways to prevent the spread of infection.   Other Education Group or individual verbal, written, or video instructions that support the educational  goals of the pulmonary rehab program.    Knowledge Questionnaire Score:  Knowledge Questionnaire Score - 04/21/23 1334       Knowledge Questionnaire Score   Pre Score 16/18             Core Components/Risk Factors/Patient Goals at Admission:  Personal Goals and Risk Factors at Admission - 04/21/23 1335       Core Components/Risk Factors/Patient Goals on Admission    Weight Management Weight Loss    Improve shortness of breath with ADL's Yes    Intervention Provide education, individualized exercise plan and daily activity instruction to help decrease symptoms of SOB with activities of daily living.    Expected Outcomes Short Term: Improve cardiorespiratory fitness to achieve a reduction of symptoms when performing ADLs;Long Term: Be able to perform more ADLs without symptoms or delay the onset of symptoms             Core Components/Risk Factors/Patient Goals Review:   Goals and Risk Factor Review     Row Name 04/23/23 1141 05/21/23 0826 06/20/23 1111         Core Components/Risk Factors/Patient Goals Review   Personal Goals Review Weight Management/Obesity;Improve shortness of breath with ADL's;Develop more efficient breathing techniques such as purse lipped breathing and diaphragmatic breathing and practicing self-pacing with activity. Weight Management/Obesity;Improve shortness of breath with ADL's;Develop more efficient breathing techniques such as purse lipped breathing and diaphragmatic breathing and practicing self-pacing with activity. Weight Management/Obesity;Improve shortness of breath with ADL's     Review Unable to assess. Brandon Phillips is scheduled to start the program on 04/29/23 Goals in action for weight loss. Brandon Phillips has been working with our dietician on weight loss, diet, and healthier eating habits. Goal is progress on improving shortness of breath with ADLs. Brandon Phillips has been able to maintain his oxygen saturation on room air while exercising. He has increased his METs  and workload while maintaining his oxygen saturation. [place] has met his goal of developing more efficient breathing techniques such as purse lipped breathing and diaphragmatic breathing and practicing self-pacing with activity. Brandon Phillips can initiate PLB on his own without staff promting and is able to listen to his body and self pace himself. Core components/risk factors/patient goals monthly re-evaluate are as follows: Goal in action for weight loss. Brandon Phillips continues working with our dietitian on changes he can make. Brandon Phillips has not lost any weight yet but is hopeful. Goal progressing on improving his shortness of breath with ADLs. Brandon Phillips has been able to wean down his oxygen on the recumbent elliptical to room air. On the rower he has been able to wean to 1L. Brandon Phillips has been able to increase his workload and METs in the program and is motivated to exercise at home. We will continue to monitor Brandon Phillips's progress throughout the program. He will continue to benefit from PR for nutrition, education, exercise, and lifestyle modification.     Expected Outcomes For Brandon Phillips to lose weight, improve his shortness of breath with ADLs, develop more efficient breathing techniques, learn how to self-pace. For  Brandon Phillips to lose weight and improve his shortness of breath with ADLs. For Brandon Phillips to lose weight and improve his shortness of breath with ADLs.              Core Components/Risk Factors/Patient Goals at Discharge (Final Review):   Goals and Risk Factor Review - 06/20/23 1111       Core Components/Risk Factors/Patient Goals Review   Personal Goals Review Weight Management/Obesity;Improve shortness of breath with ADL's    Review Core components/risk factors/patient goals monthly re-evaluate are as follows: Goal in action for weight loss. Brandon Phillips continues working with our dietitian on changes he can make. Brandon Phillips has not lost any weight yet but is hopeful. Goal progressing on improving his shortness of breath with ADLs. Brandon Phillips has been able  to wean down his oxygen on the recumbent elliptical to room air. On the rower he has been able to wean to 1L. Brandon Phillips has been able to increase his workload and METs in the program and is motivated to exercise at home. We will continue to monitor Brandon Phillips's progress throughout the program. He will continue to benefit from PR for nutrition, education, exercise, and lifestyle modification.    Expected Outcomes For Brandon Phillips to lose weight and improve his shortness of breath with ADLs.             ITP Comments: Pt is making expected progress toward Pulmonary Rehab goals after completing 15 session(s). Recommend continued exercise, life style modification, education, and utilization of breathing techniques to increase stamina and strength, while also decreasing shortness of breath with exertion.     Comments: Dr. Mechele Collin is Medical Director for Pulmonary Rehab at [facility].

## 2023-06-27 DIAGNOSIS — R490 Dysphonia: Secondary | ICD-10-CM | POA: Diagnosis not present

## 2023-06-27 DIAGNOSIS — E785 Hyperlipidemia, unspecified: Secondary | ICD-10-CM | POA: Diagnosis not present

## 2023-06-27 DIAGNOSIS — J449 Chronic obstructive pulmonary disease, unspecified: Secondary | ICD-10-CM | POA: Diagnosis not present

## 2023-07-01 ENCOUNTER — Encounter (HOSPITAL_COMMUNITY)
Admission: RE | Admit: 2023-07-01 | Discharge: 2023-07-01 | Disposition: A | Discharge status: Home / Self Care | Payer: BC Managed Care – PPO | Source: Ambulatory Visit | Attending: Internal Medicine | Admitting: Internal Medicine

## 2023-07-01 DIAGNOSIS — J449 Chronic obstructive pulmonary disease, unspecified: Secondary | ICD-10-CM | POA: Insufficient documentation

## 2023-07-01 NOTE — Progress Notes (Signed)
Daily Session Note  Patient Details  Name: Brandon Phillips. MRN: [id number] Date of Birth: 08-02-1965 Referring Provider:   Doristine Devoid Pulmonary Rehab Walk Test from 04/21/2023 in [facility], V[facility], & Lung Health  Referring Provider Ramaswamy       Encounter Date: 07/01/2023  Check In:  Session Check In - 07/01/23 1430       Check-In   Supervising physician immediately available to respond to emergencies [facility] MD immediately available    Physician(s) Eligha Bridegroom, NP    Location [facility]    Staff Present Raford Pitcher, MS, ACSM-CEP, Exercise Physiologist;Randi Dionisio Paschal, ACSM-CEP, Exercise Physiologist;Kennedee Kitzmiller Gerre Scull, RN, Marton Redwood, MS, ACSM-CEP, CCRP, Exercise Physiologist    Virtual Visit No    Medication changes reported     No    Fall or balance concerns reported    No    Tobacco Cessation No Change    Warm-up and Cool-down Performed as group-led instruction    Resistance Training Performed Yes    VAD Patient? No    PAD/SET Patient? No      Pain Assessment   Currently in Pain? No/denies    Pain Score 0-No pain    Multiple Pain Sites No             Capillary Blood Glucose: No results found for this or any previous visit (from the past 24 hour(s)).    Social History   Tobacco Use  Smoking Status Former   Current packs/day: 0.00   Average packs/day: 1.5 packs/day for 35.0 years (52.5 ttl pk-yrs)   Types: Cigarettes   Start date: 03/30/1982   Quit date: 03/30/2017   Years since quitting: 6.2  Smokeless Tobacco Never    Goals Met:  Independence with exercise equipment Exercise tolerated well No report of concerns or symptoms today Strength training completed today  Goals Unmet:  Not Applicable  Comments: Service time is from 1322 to 1433    Dr. Mechele Collin is Medical Director for Pulmonary Rehab at [facility].

## 2023-07-03 ENCOUNTER — Encounter (HOSPITAL_COMMUNITY)
Admission: RE | Admit: 2023-07-03 | Discharge: 2023-07-03 | Disposition: A | Discharge status: Home / Self Care | Payer: BC Managed Care – PPO | Source: Ambulatory Visit | Attending: Internal Medicine | Admitting: Internal Medicine

## 2023-07-03 DIAGNOSIS — J449 Chronic obstructive pulmonary disease, unspecified: Secondary | ICD-10-CM

## 2023-07-03 NOTE — Progress Notes (Signed)
Daily Session Note  Patient Details  Name: Brandon Phillips. MRN: [id number] Date of Birth: 05-02-1966 Referring Provider:   Doristine Devoid Pulmonary Rehab Walk Test from 04/21/2023 in [facility] for Heart, Vascular, & Lung Health  Referring Provider Ramaswamy       Encounter Date: 07/03/2023  Check In:  Session Check In - 07/03/23 1331       Check-In   Supervising physician immediately available to respond to emergencies [facility] MD immediately available    Physician(s) Edd Fabian, NP    Location [facility]    Staff Present Raford Pitcher, MS, ACSM-CEP, Exercise Physiologist;Randi Dionisio Paschal, ACSM-CEP, Exercise Physiologist;Mary Gerre Scull, RN, Fuller Plan, RT    Virtual Visit No    Medication changes reported     No    Fall or balance concerns reported    No    Tobacco Cessation No Change    Warm-up and Cool-down Performed as group-led instruction    Resistance Training Performed Yes    VAD Patient? No    PAD/SET Patient? No      Pain Assessment   Currently in Pain? No/denies    Pain Score 0-No pain    Multiple Pain Sites No             Capillary Blood Glucose: No results found for this or any previous visit (from the past 24 hour(s)).    Social History   Tobacco Use  Smoking Status Former   Current packs/day: 0.00   Average packs/day: 1.5 packs/day for 35.0 years (52.5 ttl pk-yrs)   Types: Cigarettes   Start date: 03/30/1982   Quit date: 03/30/2017   Years since quitting: 6.2  Smokeless Tobacco Never    Goals Met:  Proper associated with RPD/PD & O2 Sat Independence with exercise equipment Exercise tolerated well No report of concerns or symptoms today Strength training completed today  Goals Unmet:  Not Applicable  Comments: Service time is from 1315 to 1443.    Dr. Mechele Collin is Medical Director for Pulmonary Rehab at [facility].

## 2023-07-04 ENCOUNTER — Other Ambulatory Visit: Payer: Self-pay

## 2023-07-07 ENCOUNTER — Inpatient Hospital Stay: Payer: BC Managed Care – PPO

## 2023-07-07 ENCOUNTER — Inpatient Hospital Stay: Payer: BC Managed Care – PPO | Admitting: Hematology & Oncology

## 2023-07-08 ENCOUNTER — Encounter (HOSPITAL_COMMUNITY)
Admission: RE | Admit: 2023-07-08 | Discharge: 2023-07-08 | Disposition: A | Discharge status: Home / Self Care | Payer: BC Managed Care – PPO | Source: Ambulatory Visit | Attending: Internal Medicine | Admitting: Internal Medicine

## 2023-07-08 VITALS — Wt 177.5 lb

## 2023-07-08 DIAGNOSIS — J449 Chronic obstructive pulmonary disease, unspecified: Secondary | ICD-10-CM

## 2023-07-08 NOTE — Progress Notes (Signed)
Daily Session Note  Patient Details  Name: Brandon Phillips. MRN: [id number] Date of Birth: 12/15/1965 Referring Provider:   Doristine Devoid Pulmonary Rehab Walk Test from 04/21/2023 in [facility] for Heart, Vascular, & Lung Health  Referring Provider Ramaswamy       Encounter Date: 07/08/2023  Check In:  Session Check In - 07/08/23 1323       Check-In   Supervising physician immediately available to respond to emergencies [facility] immediately available    Physician(s) Carlyon Shadow, NP    Location [facility]    Staff Present Raford Pitcher, MS, ACSM-CEP, Exercise Physiologist;Randi Dionisio Paschal, ACSM-CEP, Exercise Physiologist;Mary Gerre Scull, RN, Fuller Plan, RT    Virtual Visit No    Medication changes reported     No    Fall or balance concerns reported    No    Tobacco Cessation No Change    Warm-up and Cool-down Performed as group-led instruction    Resistance Training Performed Yes    VAD Patient? No    PAD/SET Patient? No      Pain Assessment   Currently in Pain? No/denies    Multiple Pain Sites No             Capillary Blood Glucose: No results found for this or any previous visit (from the past 24 hour(s)).   Exercise Prescription Changes - 07/08/23 1500       Response to Exercise   Blood Pressure (Admit) 122/60    Blood Pressure (Exercise) 160/80    Blood Pressure (Exit) 108/68    Heart Rate (Admit) 90 bpm    Heart Rate (Exercise) 140 bpm    Heart Rate (Exit) 110 bpm    Oxygen Saturation (Admit) 95 %   0 L   Oxygen Saturation (Exercise) 89 %   0 L   Oxygen Saturation (Exit) 94 %   0 L   Rating of Perceived Exertion (Exercise) 15    Perceived Dyspnea (Exercise) 2    Duration Continue with 30 min of aerobic exercise without signs/symptoms of physical distress.    Intensity THRR unchanged      Progression   Progression Continue to progress workloads to maintain intensity without signs/symptoms of  physical distress.      Resistance Training   Training Prescription Yes    Weight black bands    Reps 10-15    Time 10 Minutes      Oxygen   Oxygen Continuous    Liters 1      Elliptical   Level 1    Speed 1    Minutes 15    METs 5.3      Rower   Level 2    Watts 46    Minutes 15      Oxygen   Maintain Oxygen Saturation 88% or higher             Social History   Tobacco Use  Smoking Status Former   Current packs/day: 0.00   Average packs/day: 1.5 packs/day for 35.0 years (52.5 ttl pk-yrs)   Types: Cigarettes   Start date: 03/30/1982   Quit date: 03/30/2017   Years since quitting: 6.2  Smokeless Tobacco Never    Goals Met:  Proper associated with RPD/PD & O2 Sat Independence with exercise equipment Exercise tolerated well No report of concerns or symptoms today Strength training completed today  Goals Unmet:  Not Applicable  Comments: Service time is from 1314  to 1438.    Dr. Mechele Collin is Medical Director for Pulmonary Rehab at [facility].

## 2023-07-09 ENCOUNTER — Ambulatory Visit (HOSPITAL_COMMUNITY): Admission: RE | Admit: 2023-07-09 | Payer: BC Managed Care – PPO | Source: Ambulatory Visit

## 2023-07-10 ENCOUNTER — Encounter: Payer: Self-pay | Admitting: Medical-Surgical

## 2023-07-10 ENCOUNTER — Encounter (HOSPITAL_COMMUNITY): Payer: BC Managed Care – PPO

## 2023-07-10 ENCOUNTER — Ambulatory Visit: Payer: BC Managed Care – PPO | Admitting: Medical-Surgical

## 2023-07-10 VITALS — BP 121/74 | HR 96 | Resp 20 | Ht 65.0 in | Wt 182.9 lb

## 2023-07-10 DIAGNOSIS — E782 Mixed hyperlipidemia: Secondary | ICD-10-CM | POA: Diagnosis not present

## 2023-07-10 DIAGNOSIS — I10 Essential (primary) hypertension: Secondary | ICD-10-CM | POA: Diagnosis not present

## 2023-07-10 NOTE — Progress Notes (Signed)
        Established patient visit  History, exam, impression, and plan:  1. Hypertension goal BP (blood pressure) < 130/80 (Primary) Pleasant 57 year old male presenting today for follow-up on hypertension.  He has been under the care of of oncology for left lower lung cancer and has been doing fairly well overall.  His oncologist had placed him on telmisartan which has been working well for him.  With his treatments and dietary changes, he notes that his blood pressure has been looking great and his dose has now been changed to 20 mg daily rather than the prior 40 mg daily.  Taking this every day and is compliant with dosing.  Denies chest pain, palpitations, headache, dizziness, lightheadedness, abrupt vision changes, and lower extremity edema.  Not checking blood pressures at home but does have regular appointments twice a week for his chemotherapy/radiation/immunotherapy treatments and notes his blood pressure has been doing great.  On exam, HRRR, S1/S2 normal.  Lungs CTA although breath sounds are diminished throughout.  Blood pressure today at goal.  Continue telmisartan as ordered.  Recommend following a low-sodium diet.  Increase activity as tolerated.  2. Mixed hyperlipidemia He does have a history of hyperlipidemia and is currently taking atorvastatin 20 mg daily.  He is due for check of lipids however would like to defer labs today so that he can have all of them done at the cancer center next week.  This is agreeable.  Continue atorvastatin as prescribed.  Once results are available, we will determine if there is a need for dose adjustment.  Procedures performed this visit: None.  Return in 6 months (on 01/08/2024), or if symptoms worsen or fail to improve, for HTN/HLD follow-up.  __________________________________ Thayer Ohm, DNP, APRN, FNP-BC Primary Care and Sports Medicine [facility]

## 2023-07-11 ENCOUNTER — Encounter: Payer: Self-pay | Admitting: Medical-Surgical

## 2023-07-11 ENCOUNTER — Encounter (HOSPITAL_COMMUNITY)
Admission: RE | Admit: 2023-07-11 | Discharge: 2023-07-11 | Disposition: A | Discharge status: Home / Self Care | Payer: BC Managed Care – PPO | Source: Ambulatory Visit | Attending: Medical Oncology | Admitting: Medical Oncology

## 2023-07-11 ENCOUNTER — Ambulatory Visit: Payer: BC Managed Care – PPO | Admitting: Podiatry

## 2023-07-11 DIAGNOSIS — C3432 Malignant neoplasm of lower lobe, left bronchus or lung: Secondary | ICD-10-CM | POA: Diagnosis not present

## 2023-07-11 LAB — GLUCOSE, CAPILLARY: Glucose-Capillary: 97 mg/dL (ref 70–99)

## 2023-07-11 MED ORDER — FLUDEOXYGLUCOSE F - 18 (FDG) INJECTION
8.8100 | Freq: Once | INTRAVENOUS | Status: AC
  Administered 2023-07-11: 8.81 via INTRAVENOUS

## 2023-07-12 ENCOUNTER — Other Ambulatory Visit: Payer: Self-pay

## 2023-07-15 ENCOUNTER — Ambulatory Visit: Payer: BC Managed Care – PPO | Admitting: Internal Medicine

## 2023-07-15 ENCOUNTER — Encounter (HOSPITAL_COMMUNITY)
Admission: RE | Admit: 2023-07-15 | Discharge: 2023-07-15 | Disposition: A | Discharge status: Home / Self Care | Payer: BC Managed Care – PPO | Source: Ambulatory Visit | Attending: Internal Medicine | Admitting: Internal Medicine

## 2023-07-15 DIAGNOSIS — J449 Chronic obstructive pulmonary disease, unspecified: Secondary | ICD-10-CM

## 2023-07-15 NOTE — Progress Notes (Signed)
Daily Session Note  Patient Details  Name: Brandon Phillips. MRN: [id number] Date of Birth: 01-09-1966 Referring Provider:   Doristine Devoid Pulmonary Rehab Walk Test from 04/21/2023 in [facility] for Heart, Vascular, & Lung Health  Referring Provider Ramaswamy       Encounter Date: 07/15/2023  Check In:  Session Check In - 07/15/23 1325       Check-In   Supervising physician immediately available to respond to emergencies [facility] MD immediately available    Physician(s) [name], NP    Location [facility]    Staff Present Raford Pitcher, MS, ACSM-CEP, Exercise Physiologist;Randi Dionisio Paschal, ACSM-CEP, Exercise Physiologist;Mary Gerre Scull, RN, Fuller Plan, RT    Virtual Visit No    Medication changes reported     No    Fall or balance concerns reported    No    Tobacco Cessation No Change    Warm-up and Cool-down Performed as group-led instruction    Resistance Training Performed Yes    VAD Patient? No    PAD/SET Patient? No      Pain Assessment   Currently in Pain? No/denies    Multiple Pain Sites No             Capillary Blood Glucose: No results found for this or any previous visit (from the past 24 hours).    Social History   Tobacco Use  Smoking Status Former   Current packs/day: 0.00   Average packs/day: 1.5 packs/day for 35.0 years (52.5 ttl pk-yrs)   Types: Cigarettes   Start date: 03/30/1982   Quit date: 03/30/2017   Years since quitting: 6.2  Smokeless Tobacco Never    Goals Met:  Proper associated with RPD/PD & O2 Sat Independence with exercise equipment Exercise tolerated well No report of concerns or symptoms today Strength training completed today  Goals Unmet:  Not Applicable  Comments: Service time is from 1324 to 1441.    Dr. Mechele Collin is Medical Director for Pulmonary Rehab at [facility].

## 2023-07-16 ENCOUNTER — Inpatient Hospital Stay (HOSPITAL_BASED_OUTPATIENT_CLINIC_OR_DEPARTMENT_OTHER): Payer: BC Managed Care – PPO | Admitting: Hematology & Oncology

## 2023-07-16 ENCOUNTER — Inpatient Hospital Stay: Payer: BC Managed Care – PPO | Attending: Hematology & Oncology

## 2023-07-16 ENCOUNTER — Encounter: Payer: Self-pay | Admitting: *Deleted

## 2023-07-16 ENCOUNTER — Encounter: Payer: Self-pay | Admitting: Hematology & Oncology

## 2023-07-16 ENCOUNTER — Inpatient Hospital Stay: Payer: BC Managed Care – PPO

## 2023-07-16 VITALS — BP 104/75 | HR 72 | Temp 97.8°F | Resp 18 | Wt 178.0 lb

## 2023-07-16 DIAGNOSIS — Z79620 Long term (current) use of immunosuppressive biologic: Secondary | ICD-10-CM | POA: Insufficient documentation

## 2023-07-16 DIAGNOSIS — Z87891 Personal history of nicotine dependence: Secondary | ICD-10-CM | POA: Insufficient documentation

## 2023-07-16 DIAGNOSIS — Z5112 Encounter for antineoplastic immunotherapy: Secondary | ICD-10-CM | POA: Insufficient documentation

## 2023-07-16 DIAGNOSIS — Z79899 Other long term (current) drug therapy: Secondary | ICD-10-CM | POA: Insufficient documentation

## 2023-07-16 DIAGNOSIS — C3432 Malignant neoplasm of lower lobe, left bronchus or lung: Secondary | ICD-10-CM

## 2023-07-16 LAB — CBC WITH DIFFERENTIAL (CANCER CENTER ONLY)
Abs Immature Granulocytes: 0.02 10*3/uL (ref 0.00–0.07)
Basophils Absolute: 0 10*3/uL (ref 0.0–0.1)
Basophils Relative: 1 %
Eosinophils Absolute: 0.2 10*3/uL (ref 0.0–0.5)
Eosinophils Relative: 3 %
HCT: 43.4 % (ref 39.0–52.0)
Hemoglobin: 14.8 g/dL (ref 13.0–17.0)
Immature Granulocytes: 0 %
Lymphocytes Relative: 11 %
Lymphs Abs: 0.7 10*3/uL (ref 0.7–4.0)
MCH: 29.4 pg (ref 26.0–34.0)
MCHC: 34.1 g/dL (ref 30.0–36.0)
MCV: 86.1 fL (ref 80.0–100.0)
Monocytes Absolute: 0.6 10*3/uL (ref 0.1–1.0)
Monocytes Relative: 10 %
Neutro Abs: 4.7 10*3/uL (ref 1.7–7.7)
Neutrophils Relative %: 75 %
Platelet Count: 206 10*3/uL (ref 150–400)
RBC: 5.04 MIL/uL (ref 4.22–5.81)
RDW: 13.5 % (ref 11.5–15.5)
WBC Count: 6.3 10*3/uL (ref 4.0–10.5)
nRBC: 0 % (ref 0.0–0.2)

## 2023-07-16 LAB — CMP (CANCER CENTER ONLY)
ALT: 35 U/L (ref 0–44)
AST: 29 U/L (ref 15–41)
Albumin: 3.6 g/dL (ref 3.5–5.0)
Alkaline Phosphatase: 48 U/L (ref 38–126)
Anion gap: 9 (ref 5–15)
BUN: 15 mg/dL (ref 6–20)
CO2: 27 mmol/L (ref 22–32)
Calcium: 9 mg/dL (ref 8.9–10.3)
Chloride: 101 mmol/L (ref 98–111)
Creatinine: 0.92 mg/dL (ref 0.61–1.24)
GFR, Estimated: >60 mL/min (ref >60)
Glucose, Bld: 126 mg/dL — ABNORMAL HIGH (ref 70–99)
Potassium: 3.9 mmol/L (ref 3.5–5.1)
Sodium: 137 mmol/L (ref 135–145)
Total Bilirubin: 0.6 mg/dL (ref <1.2)
Total Protein: 6.8 g/dL (ref 6.5–8.1)

## 2023-07-16 LAB — TSH: TSH: 1.567 u[IU]/mL (ref 0.350–4.500)

## 2023-07-16 MED ORDER — HEPARIN SOD (PORK) LOCK FLUSH 100 UNIT/ML IV SOLN
500.0000 [IU] | Freq: Once | INTRAVENOUS | Status: AC | PRN
Start: 2023-07-16 — End: 2023-07-16
  Administered 2023-07-16: 500 [IU]

## 2023-07-16 MED ORDER — SODIUM CHLORIDE 0.9% FLUSH
10.0000 mL | INTRAVENOUS | Status: DC | PRN
  Administered 2023-07-16: 10 mL

## 2023-07-16 MED ORDER — SODIUM CHLORIDE 0.9 % IV SOLN: Freq: Once | INTRAVENOUS | Status: AC

## 2023-07-16 MED ORDER — SODIUM CHLORIDE 0.9 % IV SOLN
1500.0000 mg | Freq: Once | INTRAVENOUS | Status: AC
  Administered 2023-07-16: 1500 mg via INTRAVENOUS
  Filled 2023-07-16: qty 30

## 2023-07-16 NOTE — Patient Instructions (Signed)
[facility] - A DEPT OF [facility]  Discharge Instructions: Thank you for choosing [facility] to provide your oncology and hematology care.   If you have a lab appointment with the Cancer Center, please go directly to the Cancer Center and check in at the registration area.  Wear comfortable clothing and clothing appropriate for easy access to any Portacath or PICC line.   We strive to give you quality time with your provider. You may need to reschedule your appointment if you arrive late (15 or more minutes).  Arriving late affects you and other patients whose appointments are after yours.  Also, if you miss three or more appointments without notifying the office, you may be dismissed from the clinic at the provider's discretion.      For prescription refill requests, have your pharmacy contact our office and allow 72 hours for refills to be completed.    Today you received the following chemotherapy and/or immunotherapy agents:  Imfinzi      To help prevent nausea and vomiting after your treatment, we encourage you to take your nausea medication as directed.  BELOW ARE SYMPTOMS THAT SHOULD BE REPORTED IMMEDIATELY: *FEVER GREATER THAN 100.4 F (38 C) OR HIGHER *CHILLS OR SWEATING *NAUSEA AND VOMITING THAT IS NOT CONTROLLED WITH YOUR NAUSEA MEDICATION *UNUSUAL SHORTNESS OF BREATH *UNUSUAL BRUISING OR BLEEDING *URINARY PROBLEMS (pain or burning when urinating, or frequent urination) *BOWEL PROBLEMS (unusual diarrhea, constipation, pain near the anus) TENDERNESS IN MOUTH AND THROAT WITH OR WITHOUT PRESENCE OF ULCERS (sore throat, sores in mouth, or a toothache) UNUSUAL RASH, SWELLING OR PAIN  UNUSUAL VAGINAL DISCHARGE OR ITCHING   Items with * indicate a potential emergency and should be followed up as soon as possible or go to the Emergency Department if any problems should occur.  Please show the CHEMOTHERAPY ALERT CARD or IMMUNOTHERAPY  ALERT CARD at check-in to the Emergency Department and triage nurse. Should you have questions after your visit or need to cancel or reschedule your appointment, please contact [facility] - A DEPT OF Eligha Bridegroom [facility]  [phone] and follow the prompts.  Office hours are 8:00 a.m. to 4:30 p.m. Monday - Friday. Please note that voicemails left after 4:00 p.m. may not be returned until the following business day.  We are closed weekends and major holidays. You have access to a nurse at all times for urgent questions. Please call the main number to the clinic [phone] and follow the prompts.  For any non-urgent questions, you may also contact your provider using MyChart. We now offer e-Visits for anyone 18 and older to request care online for non-urgent symptoms. For details visit [email or web address].   Also download the MyChart app! Go to the app store, search "MyChart", open the app, select [place], and log in with your MyChart username and password.

## 2023-07-16 NOTE — Progress Notes (Signed)
Reviewed PET which shows treatment response. He will proceed with treatment today.   Oncology Nurse Navigator Documentation     07/16/2023    9:00 AM  Oncology Nurse Navigator Flowsheets  Navigator Follow Up Date: 08/13/2023  Navigator Follow Up Reason: Follow-up Appointment;Chemotherapy  Navigator Location [facility]  Navigator Encounter Type Treatment;Scan Review  Patient Visit Type MedOnc  Treatment Phase Active Tx  Barriers/Navigation Needs No Barriers At This Time  Interventions None Required  Acuity Level 1-No Barriers  Support Groups/Services Friends and Family  Time Spent with Patient 30

## 2023-07-16 NOTE — Patient Instructions (Signed)

## 2023-07-16 NOTE — Progress Notes (Signed)
[facility] Hematology and Oncology Follow Up Visit  Darien Norwick Jaquane Pensinger [id number] 06/09/1966 57 y.o. 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 twice daily-start on 12/06/2022 -changed to 2.5 mg p.o. twice daily on 05/12/2023 Status post radiosurgery-completed on 02/11/2023 Durvalumab 1500 mg IV monthly-s/p cycle #3-- start on 02/17/2023     Interim History:  Mr. Fons is back for follow-up and Durvalumab treatment.  We did get a PET scan on him.  He finally got the result back.  Thankfully, the result did not show any evidence of recurrent/residual disease.  The PET scan was done on 07/11/2023.  The only possible abnormality was some increased left hilar hypermetabolism.  He is feels well.  He just wears oxygen at nighttime.  He has had no pain.  He is working.  He has had no change in bowel or bladder habits.  There has been no diarrhea.  His last TSH a couple weeks ago was 1.6.  He has had no bleeding.  There is been no headache.  He continues on the Eliquis for the history of pulmonary embolism.  He is on a maintenance dose right now.  His last CT angiogram that was done in January did not show any evidence of residual pulmonary embolism.  Overall, I would have to say that his performance status is ECOG 1.   Wt Readings from Last 3 Encounters:  07/16/23 178 lb (80.7 kg)  07/11/23 177 lb (80.3 kg)  07/10/23 182 lb 14.4 oz (83 kg)     Medications:  Current Outpatient Medications:    apixaban (ELIQUIS) 2.5 MG TABS tablet, Take 1 tablet (2.5 mg total) by mouth 2 (two) times daily., Disp: 60 tablet, Rfl: 12   atorvastatin (LIPITOR) 20 MG tablet, TAKE ONE TABLET BY MOUTH DAILY, Disp: 90 tablet, Rfl: 3   Budeson-Glycopyrrol-Formoterol (BREZTRI  AEROSPHERE) 160-9-4.8 MCG/ACT AERO, Inhale 2 puffs into the lungs in the morning and at bedtime., Disp: 1 each, Rfl: 11   COENZYME Q10 PO, Take 100 mg by mouth in the morning., Disp: , Rfl:    folic acid (FOLVITE) 1 MG tablet, Take 1 mg by mouth daily., Disp: , Rfl:    gabapentin (NEURONTIN) 300 MG capsule, Take 1 capsule (300 mg total) by mouth 2 (two) times daily. TAKE 1 CAPSULE BY MOUTH EVERY NIGHT AT BEDTIME FOR 3 NIGHTS, THEN INCREASE TO TAKE 1 CAPSULE BY MOUTH 2 TIMES A DAY FOR 1 WEEK, THEN OK TO INCREASE TO TAKE 1 CAPSULE BY MOUTH 3 TIMES A DAY, Disp: 90 capsule, Rfl: 3   GUAIFENESIN 1200 PO, Take 1,200 mg by mouth in the morning and at bedtime., Disp: , Rfl:    lidocaine-prilocaine (EMLA) cream, Apply a dime size to port-a-cath 1-2 hours prior to access. Cover with saran wrap., Disp: 30 g, Rfl: 2   Multiple Vitamin (MULTIVITAMIN) capsule, Take 1 capsule by mouth daily., Disp: , Rfl:    telmisartan (MICARDIS) 20 MG tablet, Take 1 tablet (20 mg total) by mouth daily., Disp: 30 tablet, Rfl: 2   albuterol (PROVENTIL) (2.5 MG/3ML) 0.083% nebulizer solution, Take 3 mLs (2.5 mg total) by nebulization every 6 (six) hours as needed for wheezing or shortness of breath. (Patient not taking: Reported on 07/16/2023), Disp: 75 mL, Rfl: 5   albuterol (  VENTOLIN HFA) 108 (90 Base) MCG/ACT inhaler, Inhale 2 puffs into the lungs every 6 (six) hours as needed for wheezing. (Patient not taking: Reported on 07/16/2023), Disp: 2 each, Rfl: 11  Allergies: No Known Allergies  Past Medical History, Surgical history, Social history, and Family History were reviewed and updated.  Review of Systems: Review of Systems  Constitutional: Negative.   HENT:  Negative.    Eyes: Negative.   Respiratory:  Positive for cough.   Cardiovascular: Negative.   Gastrointestinal: Negative.   Endocrine: Negative.   Genitourinary: Negative.    Musculoskeletal: Negative.   Skin: Negative.   Neurological: Negative.    Hematological: Negative.   Psychiatric/Behavioral: Negative.      Physical Exam: His vital signs are temperature 97.8.  Pulse 72.  Blood pressure 104/75.  Weight is 178 pounds.     Wt Readings from Last 3 Encounters:  07/16/23 178 lb (80.7 kg)  07/11/23 177 lb (80.3 kg)  07/10/23 182 lb 14.4 oz (83 kg)    Physical Exam Vitals reviewed.  HENT:     Head: Normocephalic and atraumatic.  Eyes:     Pupils: Pupils are equal, round, and reactive to light.  Cardiovascular:     Rate and Rhythm: Normal rate and regular rhythm.     Heart sounds: Normal heart sounds.  Pulmonary:     Effort: Pulmonary effort is normal.     Breath sounds: Normal breath sounds.  Abdominal:     General: Bowel sounds are normal.     Palpations: Abdomen is soft.  Musculoskeletal:        General: No tenderness or deformity. Normal range of motion.     Cervical back: Normal range of motion.  Lymphadenopathy:     Cervical: No cervical adenopathy.  Skin:    General: Skin is warm and dry.     Findings: No erythema or rash.  Neurological:     Mental Status: He is alert and oriented to person, place, and time.  Psychiatric:        Behavior: Behavior normal.        Thought Content: Thought content normal.        Judgment: Judgment normal.     Lab Results  Component Value Date   WBC 6.3 07/16/2023   HGB 14.8 07/16/2023   HCT 43.4 07/16/2023   MCV 86.1 07/16/2023   PLT 206 07/16/2023     Chemistry      Component Value Date/Time   NA 137 07/16/2023 0825   NA 138 04/16/2023 1109   K 3.9 07/16/2023 0825   CL 101 07/16/2023 0825   CO2 27 07/16/2023 0825   BUN 15 07/16/2023 0825   BUN 11 04/16/2023 1109   CREATININE 0.92 07/16/2023 0825   CREATININE 1.05 01/11/2022 0000      Component Value Date/Time   CALCIUM 9.0 07/16/2023 0825   ALKPHOS 48 07/16/2023 0825   AST 29 07/16/2023 0825   ALT 35 07/16/2023 0825   BILITOT 0.6 07/16/2023 0825       Impression and Plan: Mr. Stooksbury is a very nice  57 year old white male.  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  Again, he does not wish to have surgery.  He has seen Dr. Dorris Fetch of thoracic surgery.  He completed radiation/chemotherapy.  He has responded but still, I think, there is residual disease. He has had radiosurgery.  I will now have him on immunotherapy.  By his recent PET scan, everything looks  better.  As such, we will continue him on maintenance immunotherapy.  We will plan to get him back to see [place] in another month.  I was very happy that he is only wearing oxygen at nighttime.    [name], MD 12/18/20249:12 AM

## 2023-07-17 ENCOUNTER — Encounter (HOSPITAL_COMMUNITY)
Admission: RE | Admit: 2023-07-17 | Discharge: 2023-07-17 | Disposition: A | Discharge status: Home / Self Care | Payer: BC Managed Care – PPO | Source: Ambulatory Visit | Attending: Internal Medicine | Admitting: Internal Medicine

## 2023-07-17 DIAGNOSIS — J449 Chronic obstructive pulmonary disease, unspecified: Secondary | ICD-10-CM | POA: Diagnosis not present

## 2023-07-17 LAB — T4: T4, Total: 7 ug/dL (ref 4.5–12.0)

## 2023-07-17 NOTE — Progress Notes (Signed)
Daily Session Note  Patient Details  Name: Christiano Ledman. MRN: [id number] Date of Birth: Apr 21, 1966 Referring Provider:   Doristine Devoid Pulmonary Rehab Walk Test from 04/21/2023 in [facility] for Heart, Vascular, & Lung Health  Referring Provider Ramaswamy       Encounter Date: 07/17/2023  Check In:  Session Check In - 07/17/23 1337       Check-In   Supervising physician immediately available to respond to emergencies [facility] MD immediately available    Physician(s) Robin Searing, NP    Location [facility]    Staff Present Raford Pitcher, MS, ACSM-CEP, Exercise Physiologist;Aneliese Beaudry Dionisio Paschal, ACSM-CEP, Exercise Physiologist;Mary Gerre Scull, RN, Fuller Plan, RT    Virtual Visit No    Medication changes reported     No    Fall or balance concerns reported    No    Tobacco Cessation No Change    Warm-up and Cool-down Performed as group-led instruction    Resistance Training Performed Yes    VAD Patient? No    PAD/SET Patient? No      Pain Assessment   Currently in Pain? No/denies    Pain Score 0-No pain    Multiple Pain Sites No             Capillary Blood Glucose: No results found for this or any previous visit (from the past 24 hours).    Social History   Tobacco Use  Smoking Status Former   Current packs/day: 0.00   Average packs/day: 1.5 packs/day for 35.0 years (52.5 ttl pk-yrs)   Types: Cigarettes   Start date: 03/30/1982   Quit date: 03/30/2017   Years since quitting: 6.3  Smokeless Tobacco Never    Goals Met:  Independence with exercise equipment Exercise tolerated well No report of concerns or symptoms today Strength training completed today  Goals Unmet:  Not Applicable  Comments: Pt graduated today. Had great success in the program. Service time is from 1315 to 1440.    Dr. Mechele Collin is Medical Director for Pulmonary Rehab at [facility].

## 2023-07-18 ENCOUNTER — Ambulatory Visit: Payer: BC Managed Care – PPO | Admitting: Podiatry

## 2023-07-18 NOTE — Progress Notes (Signed)
Discharge Progress Report  Patient Details  Name: Brandon Phillips. MRN: [id number] Date of Birth: [birth year] Referring Provider:   Doristine Devoid Pulmonary Rehab Walk Test from 04/21/2023 in [facility]  Referring Provider Ramaswamy        Number of Visits: 20  Reason for Discharge:  Patient has met program and personal goals.  Smoking History:  Social History   Tobacco Use  Smoking Status Former   Current packs/day: 0.00   Average packs/day: 1.5 packs/day for 35.0 years (52.5 ttl pk-yrs)   Types: Cigarettes   Start date: 03/30/1982   Quit date: 03/30/2017   Years since quitting: 6.3  Smokeless Tobacco Never    Diagnosis:  Stage 3 severe COPD by GOLD classification (HCC)  ADL [facility]:  Pulmonary Assessment Scores     Row Name 04/21/23 1328 07/03/23 1440 07/17/23 1529     ADL [facility]   ADL Phase Entry Exit Exit   SOB Score total 40 8 --     CAT Score   CAT Score 17 7 --     mMRC Score   mMRC Score 2 -- 0            Initial Exercise Prescription:  Initial Exercise Prescription - 04/21/23 1400       Date of Initial Exercise RX and Referring Provider   Date 04/21/23    Referring Provider Ramaswamy    Expected Discharge Date 04/21/23      Oxygen   Oxygen Continuous    Liters 1    Maintain Oxygen Saturation 88% or higher      Treadmill   MPH 2    Grade 0    Minutes 15    METs 3      Recumbant Elliptical   Level 3    RPM 70    Minutes 15    METs 3      Prescription Details   Frequency (times per week) 2    Duration Progress to 30 minutes of continuous aerobic without signs/symptoms of physical distress      Intensity   THRR 40-80% of Max Heartrate 66-132    Ratings of Perceived Exertion 11-13    Perceived Dyspnea 0-4      Progression   Progression Continue to progress workloads to maintain intensity without signs/symptoms of physical distress.      Resistance Training    Training Prescription Yes    Weight blue bands    Reps 10-15             Discharge Exercise Prescription (Final Exercise Prescription Changes):  Exercise Prescription Changes - 07/08/23 1500       Response to Exercise   Blood Pressure (Admit) 122/60    Blood Pressure (Exercise) 160/80    Blood Pressure (Exit) 108/68    Heart Rate (Admit) 90 bpm    Heart Rate (Exercise) 140 bpm    Heart Rate (Exit) 110 bpm    Oxygen Saturation (Admit) 95 %   0 L   Oxygen Saturation (Exercise) 89 %   0 L   Oxygen Saturation (Exit) 94 %   0 L   Rating of Perceived Exertion (Exercise) 15    Perceived Dyspnea (Exercise) 2    Duration Continue with 30 min of aerobic exercise without signs/symptoms of physical distress.    Intensity THRR unchanged      Progression   Progression Continue to progress workloads to maintain intensity without  signs/symptoms of physical distress.      Resistance Training   Training Prescription Yes    Weight black bands    Reps 10-15    Time 10 Minutes      Oxygen   Oxygen Continuous    Liters 1      Elliptical   Level 1    Speed 1    Minutes 15    METs 5.3      Rower   Level 2    Watts 46    Minutes 15      Oxygen   Maintain Oxygen Saturation 88% or higher             Functional Capacity:  6 Minute Walk     Row Name 04/21/23 1432 07/17/23 1525       6 Minute Walk   Phase Initial Discharge    Distance 1060 feet 1480 feet    Distance % Change -- 39.62 %    Distance Feet Change -- 420 ft    Walk Time 6 minutes 6 minutes    # of Rest Breaks 0 0    MPH 2.01 2.8    METS 3.03 4.13    RPE 11 9    Perceived Dyspnea  1 1    VO2 Peak 10.59 14.45    Symptoms No No    Resting HR 86 bpm 105 bpm    Resting BP 122/76 120/68    Resting Oxygen Saturation  90 % 93 %    Exercise Oxygen Saturation  during 6 min walk 86 % 90 %    Max Ex. HR 99 bpm 115 bpm    Max Ex. BP 108/60 134/78    2 Minute Post BP 106/70 110/64      Interval HR   1 Minute  HR 94 109    2 Minute HR 96 105    3 Minute HR 96 108    4 Minute HR 97 109    5 Minute HR 96 111    6 Minute HR 99 115    2 Minute Post HR 97 111    Interval Heart Rate? Yes --      Interval Oxygen   Interval Oxygen? Yes --    Baseline Oxygen Saturation % 90 % 93 %    1 Minute Oxygen Saturation % 90 % 93 %    1 Minute Liters of Oxygen 0 L 0 L    2 Minute Oxygen Saturation % 87 %  lowest 86 RA, applied 1L 91 %    2 Minute Liters of Oxygen 0 L 0 L    3 Minute Oxygen Saturation % 88 % 90 %    3 Minute Liters of Oxygen 1 L 0 L    4 Minute Oxygen Saturation % 89 % 91 %    4 Minute Liters of Oxygen 1 L 0 L    5 Minute Oxygen Saturation % 89 % 90 %    5 Minute Liters of Oxygen 1 L 0 L    6 Minute Oxygen Saturation % 90 % 91 %    6 Minute Liters of Oxygen 1 L 0 L    2 Minute Post Oxygen Saturation % 96 % 95 %    2 Minute Post Liters of Oxygen 1 L 0 L             Psychological, QOL, Others - Outcomes: PHQ 2/9:    07/03/2023    2:39  PM 04/21/2023    1:30 PM 04/21/2023    1:27 PM 04/16/2023   10:39 AM 09/05/2022    4:09 PM  Depression screen PHQ 2/9  Decreased Interest 0 0 0 0 0  Down, Depressed, Hopeless 0 0 0 0 0  PHQ - 2 Score 0 0 0 0 0  Altered sleeping 0 0     Tired, decreased energy 0 0     Change in appetite 0 0     Feeling bad or failure about yourself  0 1     Trouble concentrating 0 0     Moving slowly or fidgety/restless 0 0     Suicidal thoughts 0 0     PHQ-9 Score 0 1     Difficult doing work/chores Not difficult at all Not difficult at all       Quality of Life:   Personal Goals: Goals established at orientation with interventions provided to work toward goal.  Personal Goals and Risk Factors at Admission - 04/21/23 1335       Core Components/Risk Factors/Patient Goals on Admission    Weight Management Weight Loss    Improve shortness of breath with ADL's Yes    Intervention Provide education, individualized exercise plan and daily activity  instruction to help decrease symptoms of SOB with activities of daily living.    Expected Outcomes Short Term: Improve cardiorespiratory fitness to achieve a reduction of symptoms when performing ADLs;Long Term: Be able to perform more ADLs without symptoms or delay the onset of symptoms              Personal Goals Discharge:  Goals and Risk Factor Review     Row Name 04/23/23 1141 05/21/23 0826 06/20/23 1111 07/18/23 0905       Core Components/Risk Factors/Patient Goals Review   Personal Goals Review Weight Management/Obesity;Improve shortness of breath with ADL's;Develop more efficient breathing techniques such as purse lipped breathing and diaphragmatic breathing and practicing self-pacing with activity. Weight Management/Obesity;Improve shortness of breath with ADL's;Develop more efficient breathing techniques such as purse lipped breathing and diaphragmatic breathing and practicing self-pacing with activity. Weight Management/Obesity;Improve shortness of breath with ADL's Weight Management/Obesity;Improve shortness of breath with ADL's    Review Unable to assess. Brandon Phillips is scheduled to start the program on 04/29/23 Goals in action for weight loss. Brandon Phillips has been working with our dietician on weight loss, diet, and healthier eating habits. Goal is progress on improving shortness of breath with ADLs. Brandon Phillips has been able to maintain his oxygen saturation on room air while exercising. He has increased his METs and workload while maintaining his oxygen saturation. Brandon Phillips has met his goal of developing more efficient breathing techniques such as purse lipped breathing and diaphragmatic breathing and practicing self-pacing with activity. Brandon Phillips can initiate PLB on his own without staff promting and is able to listen to his body and self pace himself. Core components/risk factors/patient goals monthly re-evaluate are as follows: Goal in action for weight loss. Brandon Phillips continues working with our dietitian on  changes he can make. Brandon Phillips has not lost any weight yet but is hopeful. Goal progressing on improving his shortness of breath with ADLs. Brandon Phillips has been able to wean down his oxygen on the recumbent elliptical to room air. On the rower he has been able to wean to 1L. Brandon Phillips has been able to increase his workload and METs in the program and is motivated to exercise at home. We will continue to monitor Brandon Phillips's progress throughout the  program. He will continue to benefit from PR for nutrition, education, exercise, and lifestyle modification. Brandon Phillips garduated from the pulmonary rehab program on 07/17/2023. Pt maintained good attendance and progressed during his participation in rehab as evidenced by increased METs and workload. Goal met for weight loss. Goal met for improving shortness of breath with ADL's. His shortness of breath score decreased from 40 to 8. His MMRC score decreased from 2 to 0. Brandon Phillips was also able to complete his post 6 minute walk test on RA. We are very proud of Brandon Phillips and all the progress he made.    Expected Outcomes For Brandon Phillips to lose weight, improve his shortness of breath with ADLs, develop more efficient breathing techniques, learn how to self-pace. For Brandon Phillips to lose weight and improve his shortness of breath with ADLs. For Brandon Phillips to lose weight and improve his shortness of breath with ADLs. To continue to exercise and modify his nutrition and lifestyle post graduation             Exercise Goals and Review:  Exercise Goals     Row Name 04/21/23 1339             Exercise Goals   Increase Physical Activity Yes       Intervention Provide advice, education, support and counseling about physical activity/exercise needs.;Develop an individualized exercise prescription for aerobic and resistive training based on initial evaluation findings, risk stratification, comorbidities and participant's personal goals.       Expected Outcomes Short Term: Attend rehab on a regular basis to increase amount  of physical activity.;Long Term: Exercising regularly at least 3-5 days a week.;Long Term: Add in home exercise to make exercise part of routine and to increase amount of physical activity.       Increase Strength and Stamina Yes       Intervention Provide advice, education, support and counseling about physical activity/exercise needs.;Develop an individualized exercise prescription for aerobic and resistive training based on initial evaluation findings, risk stratification, comorbidities and participant's personal goals.       Expected Outcomes Short Term: Increase workloads from initial exercise prescription for resistance, speed, and METs.;Short Term: Perform resistance training exercises routinely during rehab and add in resistance training at home;Long Term: Improve cardiorespiratory fitness, muscular endurance and strength as measured by increased METs and functional capacity ( )       Able to understand and use rate of perceived exertion (RPE) scale Yes       Intervention Provide education and explanation on how to use RPE scale       Expected Outcomes Short Term: Able to use RPE daily in rehab to express subjective intensity level;Long Term:  Able to use RPE to guide intensity level when exercising independently       Able to understand and use Dyspnea scale Yes       Intervention Provide education and explanation on how to use Dyspnea scale       Expected Outcomes Short Term: Able to use Dyspnea scale daily in rehab to express subjective sense of shortness of breath during exertion;Long Term: Able to use Dyspnea scale to guide intensity level when exercising independently       Knowledge and understanding of Target Heart Rate Range (THRR) Yes       Intervention Provide education and explanation of THRR including how the numbers were predicted and where they are located for reference       Expected Outcomes Short Term: Able to state/look up THRR;Long  Term: Able to use THRR to govern intensity  when exercising independently;Short Term: Able to use daily as guideline for intensity in rehab       Understanding of Exercise Prescription Yes       Intervention Provide education, explanation, and written materials on patient's individual exercise prescription       Expected Outcomes Short Term: Able to explain program exercise prescription;Long Term: Able to explain home exercise prescription to exercise independently                Exercise Goals Re-Evaluation:  Exercise Goals Re-Evaluation     Row Name 04/22/23 0831 05/26/23 0938 06/17/23 0838 07/17/23 1532       Exercise Goal Re-Evaluation   Exercise Goals Review Increase Physical Activity;Able to understand and use Dyspnea scale;Understanding of Exercise Prescription;Increase Strength and Stamina;Knowledge and understanding of Target Heart Rate Range (THRR);Able to understand and use rate of perceived exertion (RPE) scale Increase Physical Activity;Able to understand and use Dyspnea scale;Understanding of Exercise Prescription;Increase Strength and Stamina;Knowledge and understanding of Target Heart Rate Range (THRR);Able to understand and use rate of perceived exertion (RPE) scale Increase Physical Activity;Able to understand and use Dyspnea scale;Understanding of Exercise Prescription;Increase Strength and Stamina;Knowledge and understanding of Target Heart Rate Range (THRR);Able to understand and use rate of perceived exertion (RPE) scale Increase Physical Activity;Able to understand and use Dyspnea scale;Understanding of Exercise Prescription;Increase Strength and Stamina;Knowledge and understanding of Target Heart Rate Range (THRR);Able to understand and use rate of perceived exertion (RPE) scale    Comments Pt to begin exercise 10/1. Will progress as tolerated. Brandon Phillips has completed 7 exercise sessions. He exercises for 15 min on the recumbent elliptical and rower. Brandon Phillips averages 3.4 METs at level 4 on the recumbent elliptical and 29  watts at level 2 on the rower. He performs the warmup and cooldown standing without limitations. Brandon Phillips has been transferred from the treadmill to the rower due to hip pain. He tolerates the rower better. Brandon Phillips has increased his workload for the recumbent elliptical as he tolerates progressions well. Will continue to monitor and progress as able. Brandon Phillips has completed 12 exercise sessions. He exercises for 15 min on the recumbent elliptical and rower. Brandon Phillips has progressed to 4.5 METs at level 5 on the recumbent elliptical and 34 watts at level 2 on the rower. He is motivated and progressing. The rower is exceptionally challenging for him. Will continue to monitor and progress as able. Brandon Phillips completed 20 exercise sessions. He exercised for 15 min on the upright elliptical and rower. Brandon Phillips progressed to 4.9 METs at level 1, incline 1 on the upright elliptical and 44 watts at level 7 on the rower. sing. He had great success with exercise, increasing his 6 min walk test by 420 ft and 39.6%. He also was able to do his 6 min walk test off O2. His SOB scale decreased from 40 to 8 and his CAT decreased from 17 to 7. He graduated using black bands and has goals to try double blue bands next. He plans to join [employer] next week.    Expected Outcomes Through exercise at rehab and home, the patient will decrease shortness of breath with daily activities and feel confident in carrying out an exercise regimen at home Through exercise at rehab and home, the patient will decrease shortness of breath with daily activities and feel confident in carrying out an exercise regimen at home Through exercise at rehab and home, the patient will decrease shortness of breath  with daily activities and feel confident in carrying out an exercise regimen at home Through exercise at rehab and home, the patient will decrease shortness of breath with daily activities and feel confident in carrying out an exercise regimen at home              Nutrition & Weight - Outcomes:  Pre Biometrics - 04/29/23 1515       Pre Biometrics   Grip Strength 42 kg             Post Biometrics - 07/17/23 1529        Post  Biometrics   Grip Strength 44 kg             Nutrition:  Nutrition Therapy & Goals - 06/23/23 1056       Nutrition Therapy   Diet General Healthy Diet    Drug/Food Interactions Statins/Certain Fruits      Personal Nutrition Goals   Nutrition Goal Patient to identify strategies for weight loss of 0.5-2.0# per week.   goal in progress.   Personal Goal #2 Patient to improve diet quality by using the plate method as a guide for meal planning to include lean protein/plant protein, fruits, vegetables, whole grains, nonfat dairy as part of a well-balanced diet.   goal in progress.   Comments Goals in progress. Brandon Phillips reports motivation to lose weight. He is up ~20# since June but he reports weight gain since starting cancer treatment. He is down 2.4# since starting with our program. 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 He is packing lunch daily for work and cooking dinner meal at home. We have discussed multiple strategies for weight loss including including calorie density, the plate method as a guide for meal planning, eliminating sugary drinks, protein drinks, etc. His wife is a good support. He has returned back to work full time. Patient will benefit from participation in pulmonary rehab for nutrition, exercise, and lifestyle modification.      Intervention Plan   Intervention Prescribe, educate and counsel regarding individualized specific dietary modifications aiming towards targeted core components such as weight, hypertension, lipid management, diabetes, heart failure and other comorbidities.;Nutrition handout(s) given to patient.    Expected Outcomes Short Term Goal: Understand basic principles of dietary content,  such as calories, fat, sodium, cholesterol and nutrients.;Long Term Goal: Adherence to prescribed nutrition plan.             Nutrition Discharge:  Nutrition Assessments - 07/03/23 1441       Rate Your Plate Scores   Post Score 39             Education Questionnaire Score:  Knowledge Questionnaire Score - 07/03/23 1440       Knowledge Questionnaire Score   Post Score 18/18             Goals reviewed with patient; copy given to patient.

## 2023-07-20 DIAGNOSIS — R490 Dysphonia: Secondary | ICD-10-CM | POA: Diagnosis not present

## 2023-07-20 DIAGNOSIS — J449 Chronic obstructive pulmonary disease, unspecified: Secondary | ICD-10-CM | POA: Diagnosis not present

## 2023-07-20 DIAGNOSIS — E785 Hyperlipidemia, unspecified: Secondary | ICD-10-CM | POA: Diagnosis not present

## 2023-07-24 ENCOUNTER — Ambulatory Visit: Payer: BC Managed Care – PPO | Admitting: Podiatry

## 2023-07-24 DIAGNOSIS — M722 Plantar fascial fibromatosis: Secondary | ICD-10-CM

## 2023-07-24 MED ORDER — TRIAMCINOLONE ACETONIDE 10 MG/ML IJ SUSP
2.5000 mg | Freq: Once | INTRAMUSCULAR | Status: AC
  Administered 2023-07-24: 2.5 mg via INTRA_ARTICULAR

## 2023-07-24 MED ORDER — DEXAMETHASONE SODIUM PHOSPHATE 120 MG/30ML IJ SOLN
4.0000 mg | Freq: Once | INTRAMUSCULAR | Status: AC
  Administered 2023-07-24: 4 mg via INTRA_ARTICULAR

## 2023-07-24 NOTE — Progress Notes (Signed)
  Subjective:  Patient ID: Brandon Mound., male    DOB: 1965-11-05,   MRN: [id number]  No chief complaint on file.   57 y.o. male presents for follow-up of bilateral plantar fasciitis. Relates right doing better but still having pain in the left. Has been stretching and wearing the brace. Relates about 60% better. . Denies any other pedal complaints. Denies n/v/f/c.   Past Medical History:  Diagnosis Date   Cancer (HCC)    lung cancer   CAP (community acquired pneumonia)    COPD (chronic obstructive pulmonary disease) (HCC)    Eczema    HLD (hyperlipidemia)    Primary cancer of left lower lobe of lung (HCC) 09/17/2022   Tobacco use disorder 01/24/2017    Objective:  Physical Exam: Vascular: DP/PT pulses 2/4 bilateral. CFT <3 seconds. Normal hair growth on digits. No edema.  Skin. No lacerations or abrasions bilateral feet.  Musculoskeletal: MMT 5/5 bilateral lower extremities in DF, PF, Inversion and Eversion. Deceased ROM in DF of ankle joint. Tender to the medial calcaneal tubercle bilaterally . No pain with achilles, PT or arch. No pain with calcaneal squeeze. More pain on left than right.  Neurological: Sensation intact to light touch.   Assessment:   1. Plantar fasciitis, bilateral      Plan:  Patient was evaluated and treated and all questions answered. Discussed plantar fasciitis with patient.  X-rays reviewed and discussed with patient. No acute fractures or dislocations noted. Minimal spurring noted at inferior calcaneus.  Discussed treatment options including, ice, NSAIDS, supportive shoes, bracing, and stretching. Continue brace and stretching.     Will avoid NSAIDs due to blood thinner and would like to avoid steroids as he is on immunotherapy.  Injection offered today. Procedure below.  Follow-up 6 weeks or sooner if any problems arise. In the meantime, encouraged to call the office with any questions, concerns, change in symptoms.    Procedure:  Injection Tendon/Ligament Discussed alternatives, risks, complications and verbal consent was obtained.  Location: Left plantar fascia . Skin Prep: Alcohol. Injectate: 1cc 0.5% marcaine plain, 1 cc dexamethasone 0.5 cc kenalog  Disposition: Patient tolerated procedure well. Injection site dressed with a band-aid.  Post-injection care was discussed and return precautions discussed.     Louann Sjogren, DPM

## 2023-07-27 DIAGNOSIS — R490 Dysphonia: Secondary | ICD-10-CM | POA: Diagnosis not present

## 2023-07-27 DIAGNOSIS — E785 Hyperlipidemia, unspecified: Secondary | ICD-10-CM | POA: Diagnosis not present

## 2023-07-27 DIAGNOSIS — J449 Chronic obstructive pulmonary disease, unspecified: Secondary | ICD-10-CM | POA: Diagnosis not present

## 2023-08-04 ENCOUNTER — Inpatient Hospital Stay: Payer: BC Managed Care – PPO

## 2023-08-04 ENCOUNTER — Inpatient Hospital Stay: Payer: BC Managed Care – PPO | Admitting: Hematology & Oncology

## 2023-08-13 ENCOUNTER — Inpatient Hospital Stay: Payer: BC Managed Care – PPO | Attending: Hematology & Oncology

## 2023-08-13 ENCOUNTER — Encounter: Payer: Self-pay | Admitting: Hematology & Oncology

## 2023-08-13 ENCOUNTER — Inpatient Hospital Stay (HOSPITAL_BASED_OUTPATIENT_CLINIC_OR_DEPARTMENT_OTHER): Payer: BC Managed Care – PPO | Admitting: Hematology & Oncology

## 2023-08-13 ENCOUNTER — Encounter: Payer: Self-pay | Admitting: *Deleted

## 2023-08-13 ENCOUNTER — Inpatient Hospital Stay: Payer: BC Managed Care – PPO

## 2023-08-13 VITALS — BP 119/66

## 2023-08-13 VITALS — BP 108/74 | HR 65 | Temp 98.2°F | Resp 20 | Ht 65.0 in | Wt 177.0 lb

## 2023-08-13 DIAGNOSIS — Z79899 Other long term (current) drug therapy: Secondary | ICD-10-CM | POA: Diagnosis not present

## 2023-08-13 DIAGNOSIS — Z5112 Encounter for antineoplastic immunotherapy: Secondary | ICD-10-CM | POA: Insufficient documentation

## 2023-08-13 DIAGNOSIS — Z87891 Personal history of nicotine dependence: Secondary | ICD-10-CM | POA: Diagnosis not present

## 2023-08-13 DIAGNOSIS — C3432 Malignant neoplasm of lower lobe, left bronchus or lung: Secondary | ICD-10-CM

## 2023-08-13 DIAGNOSIS — Z79620 Long term (current) use of immunosuppressive biologic: Secondary | ICD-10-CM | POA: Diagnosis not present

## 2023-08-13 LAB — CBC WITH DIFFERENTIAL (CANCER CENTER ONLY)
Abs Immature Granulocytes: 0.02 10*3/uL (ref 0.00–0.07)
Basophils Absolute: 0 10*3/uL (ref 0.0–0.1)
Basophils Relative: 1 %
Eosinophils Absolute: 0.2 10*3/uL (ref 0.0–0.5)
Eosinophils Relative: 3 %
HCT: 44.2 % (ref 39.0–52.0)
Hemoglobin: 15.2 g/dL (ref 13.0–17.0)
Immature Granulocytes: 0 %
Lymphocytes Relative: 10 %
Lymphs Abs: 0.7 10*3/uL (ref 0.7–4.0)
MCH: 29.5 pg (ref 26.0–34.0)
MCHC: 34.4 g/dL (ref 30.0–36.0)
MCV: 85.7 fL (ref 80.0–100.0)
Monocytes Absolute: 0.6 10*3/uL (ref 0.1–1.0)
Monocytes Relative: 9 %
Neutro Abs: 5.1 10*3/uL (ref 1.7–7.7)
Neutrophils Relative %: 77 %
Platelet Count: 209 10*3/uL (ref 150–400)
RBC: 5.16 MIL/uL (ref 4.22–5.81)
RDW: 13.4 % (ref 11.5–15.5)
WBC Count: 6.6 10*3/uL (ref 4.0–10.5)
nRBC: 0 % (ref 0.0–0.2)

## 2023-08-13 LAB — CMP (CANCER CENTER ONLY)
ALT: 38 U/L (ref 0–44)
AST: 22 U/L (ref 15–41)
Albumin: 4.2 g/dL (ref 3.5–5.0)
Alkaline Phosphatase: 50 U/L (ref 38–126)
Anion gap: 8 (ref 5–15)
BUN: 17 mg/dL (ref 6–20)
CO2: 28 mmol/L (ref 22–32)
Calcium: 9.3 mg/dL (ref 8.9–10.3)
Chloride: 101 mmol/L (ref 98–111)
Creatinine: 0.99 mg/dL (ref 0.61–1.24)
GFR, Estimated: >60 mL/min (ref >60)
Glucose, Bld: 99 mg/dL (ref 70–99)
Potassium: 4 mmol/L (ref 3.5–5.1)
Sodium: 137 mmol/L (ref 135–145)
Total Bilirubin: 0.5 mg/dL (ref 0.0–1.2)
Total Protein: 7.1 g/dL (ref 6.5–8.1)

## 2023-08-13 LAB — TSH: TSH: 2.192 u[IU]/mL (ref 0.350–4.500)

## 2023-08-13 LAB — LACTATE DEHYDROGENASE: LDH: 159 U/L (ref 98–192)

## 2023-08-13 MED ORDER — SODIUM CHLORIDE 0.9% FLUSH
10.0000 mL | INTRAVENOUS | Status: DC | PRN
  Administered 2023-08-13: 10 mL

## 2023-08-13 MED ORDER — SODIUM CHLORIDE 0.9 % IV SOLN
1500.0000 mg | Freq: Once | INTRAVENOUS | Status: AC
  Administered 2023-08-13: 1500 mg via INTRAVENOUS
  Filled 2023-08-13: qty 30

## 2023-08-13 MED ORDER — SODIUM CHLORIDE 0.9 % IV SOLN: Freq: Once | INTRAVENOUS | Status: AC

## 2023-08-13 MED ORDER — HEPARIN SOD (PORK) LOCK FLUSH 100 UNIT/ML IV SOLN
500.0000 [IU] | Freq: Once | INTRAVENOUS | Status: AC | PRN
  Administered 2023-08-13: 500 [IU]

## 2023-08-13 NOTE — Progress Notes (Signed)
 [facility] Hematology and Oncology Follow Up Visit  Brandon Phillips [id number] 1966/07/13 58 y.o. 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 twice daily-start on 12/06/2022 -changed to 2.5 mg p.o. twice daily on 05/12/2023 Status post radiosurgery-completed on 02/11/2023 Durvalumab  1500 mg IV monthly-s/p cycle #3-- start on 02/17/2023     Interim History:  Brandon Phillips is back for follow-up and Durvalumab  treatment.  Overall, he is doing quite nicely.  He only wears oxygen  at nighttime.  I think this is a very good idea for him.  He is still working.  He is having no problems at work.  He has had little bit of a cough but no hemoptysis.  There is no pain.  He has had no nausea or vomiting.  There has been no change in bowel or bladder habits.  His TSH has been normal.  His last TSH back in December was 1.6.  He has had no headache.  He has had no visual changes.  He continues on maintenance Eliquis .  He is doing well on the maintenance Eliquis .  Overall, I would say that his performance status is probably ECOG 1.   Wt Readings from Last 3 Encounters:  08/13/23 177 lb (80.3 kg)  07/16/23 178 lb (80.7 kg)  07/11/23 177 lb (80.3 kg)     Medications:  Current Outpatient Medications:    albuterol  (PROVENTIL ) (2.5 MG/3ML) 0.083% nebulizer solution, Take 3 mLs (2.5 mg total) by nebulization every 6 (six) hours as needed for wheezing or shortness of breath., Disp: 75 mL, Rfl: 5   apixaban  (ELIQUIS ) 2.5 MG TABS tablet, Take 1 tablet (2.5 mg total) by mouth 2 (two) times daily., Disp: 60 tablet, Rfl: 12   atorvastatin  (LIPITOR) 20 MG tablet, TAKE ONE TABLET BY MOUTH DAILY, Disp: 90 tablet, Rfl: 3   Budeson-Glycopyrrol-Formoterol  (BREZTRI  AEROSPHERE) 160-9-4.8  MCG/ACT AERO, Inhale 2 puffs into the lungs in the morning and at bedtime., Disp: 1 each, Rfl: 11   COENZYME Q10 PO, Take 100 mg by mouth in the morning., Disp: , Rfl:    gabapentin  (NEURONTIN ) 300 MG capsule, Take 1 capsule (300 mg total) by mouth 2 (two) times daily. TAKE 1 CAPSULE BY MOUTH EVERY NIGHT AT BEDTIME FOR 3 NIGHTS, THEN INCREASE TO TAKE 1 CAPSULE BY MOUTH 2 TIMES A DAY FOR 1 WEEK, THEN OK TO INCREASE TO TAKE 1 CAPSULE BY MOUTH 3 TIMES A DAY, Disp: 90 capsule, Rfl: 3   GUAIFENESIN 1200 PO, Take 1,200 mg by mouth in the morning and at bedtime., Disp: , Rfl:    Multiple Vitamin (MULTIVITAMIN) capsule, Take 1 capsule by mouth daily., Disp: , Rfl:    telmisartan  (MICARDIS ) 20 MG tablet, Take 1 tablet (20 mg total) by mouth daily., Disp: 30 tablet, Rfl: 2   albuterol  (VENTOLIN  HFA) 108 (90 Base) MCG/ACT inhaler, Inhale 2 puffs into the lungs every 6 (six) hours as needed for wheezing. (Patient not taking: Reported on 07/16/2023), Disp: 2 each, Rfl: 11   lidocaine -prilocaine  (EMLA ) cream, Apply a dime size to port-a-cath 1-2 hours prior to access. Cover with saran wrap. (Patient not taking: Reported on 08/13/2023), Disp: 30 g, Rfl: 2  Allergies: No Known Allergies  Past Medical History, Surgical history, Social history, and Family History were reviewed and updated.  Review of Systems: Review of Systems  Constitutional: Negative.   HENT:  Negative.    Eyes: Negative.   Respiratory:  Positive for cough.   Cardiovascular: Negative.   Gastrointestinal: Negative.   Endocrine: Negative.   Genitourinary: Negative.    Musculoskeletal: Negative.   Skin: Negative.   Neurological: Negative.   Hematological: Negative.   Psychiatric/Behavioral: Negative.      Physical Exam: His vital signs are temperature 98.2.  Pulse 65.  Blood pressure 108/74.  Weight is 177 pounds   Wt Readings from Last 3 Encounters:  08/13/23 177 lb (80.3 kg)  07/16/23 178 lb (80.7 kg)  07/11/23 177 lb (80.3 kg)     Physical Exam Vitals reviewed.  HENT:     Head: Normocephalic and atraumatic.  Eyes:     Pupils: Pupils are equal, round, and reactive to light.  Cardiovascular:     Rate and Rhythm: Normal rate and regular rhythm.     Heart sounds: Normal heart sounds.  Pulmonary:     Effort: Pulmonary effort is normal.     Breath sounds: Normal breath sounds.  Abdominal:     General: Bowel sounds are normal.     Palpations: Abdomen is soft.  Musculoskeletal:        General: No tenderness or deformity. Normal range of motion.     Cervical back: Normal range of motion.  Lymphadenopathy:     Cervical: No cervical adenopathy.  Skin:    General: Skin is warm and dry.     Findings: No erythema or rash.  Neurological:     Mental Status: He is alert and oriented to person, place, and time.  Psychiatric:        Behavior: Behavior normal.        Thought Content: Thought content normal.        Judgment: Judgment normal.     Lab Results  Component Value Date   WBC 6.6 08/13/2023   HGB 15.2 08/13/2023   HCT 44.2 08/13/2023   MCV 85.7 08/13/2023   PLT 209 08/13/2023     Chemistry      Component Value Date/Time   NA 137 07/16/2023 0825   NA 138 04/16/2023 1109   K 3.9 07/16/2023 0825   CL 101 07/16/2023 0825   CO2 27 07/16/2023 0825   BUN 15 07/16/2023 0825   BUN 11 04/16/2023 1109   CREATININE 0.92 07/16/2023 0825   CREATININE 1.05 01/11/2022 0000      Component Value Date/Time   CALCIUM  9.0 07/16/2023 0825   ALKPHOS 48 07/16/2023 0825   AST 29 07/16/2023 0825   ALT 35 07/16/2023 0825   BILITOT 0.6 07/16/2023 0825       Impression and Plan: Brandon Phillips is a very nice 58 year old white male.  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  Again, he does not wish to have surgery.  He has seen Dr. Luna Salinas of thoracic surgery.  He completed radiation/chemotherapy.  He has responded but still, I think, there was residual disease. He has had radiosurgery.  We now  have him on immunotherapy.  I think the PET scan is encouraging.  There is that 1 area of hypermetabolism in the hilum that we will have to watch.  I think we will do another PET scan on him probably in April.  Will have him come back in 4 weeks for another dose of Durvalumab .   Ivor Mars, MD 1/15/20259:46 AM

## 2023-08-13 NOTE — Patient Instructions (Signed)

## 2023-08-13 NOTE — Progress Notes (Signed)
 Patient will receive next treatment cycle. Patient is doing well on treatment and hasn't had navigation needs for some time. At this time will discontinue active navigation but be available to the patient as needed in the future.   Oncology Nurse Navigator Documentation     08/13/2023   10:30 AM  Oncology Nurse Navigator Flowsheets  Navigation Complete Date: 08/13/2023  Post Navigation: Continue to Follow Patient? No  Reason Not Navigating Patient: No Barriers Identified  Navigator Location [facility]  Navigator Encounter Type Treatment  Patient Visit Type MedOnc  Treatment Phase Active Tx  Barriers/Navigation Needs No Barriers At This Time  Interventions None Required  Acuity Level 1-No Barriers  Support Groups/Services Friends and Family  Time Spent with Patient 15

## 2023-08-20 DIAGNOSIS — R490 Dysphonia: Secondary | ICD-10-CM | POA: Diagnosis not present

## 2023-08-20 DIAGNOSIS — E785 Hyperlipidemia, unspecified: Secondary | ICD-10-CM | POA: Diagnosis not present

## 2023-08-20 DIAGNOSIS — J449 Chronic obstructive pulmonary disease, unspecified: Secondary | ICD-10-CM | POA: Diagnosis not present

## 2023-08-27 DIAGNOSIS — E785 Hyperlipidemia, unspecified: Secondary | ICD-10-CM | POA: Diagnosis not present

## 2023-08-27 DIAGNOSIS — R490 Dysphonia: Secondary | ICD-10-CM | POA: Diagnosis not present

## 2023-08-27 DIAGNOSIS — J449 Chronic obstructive pulmonary disease, unspecified: Secondary | ICD-10-CM | POA: Diagnosis not present

## 2023-09-04 ENCOUNTER — Ambulatory Visit: Payer: BC Managed Care – PPO | Admitting: Podiatry

## 2023-09-10 ENCOUNTER — Inpatient Hospital Stay: Payer: BC Managed Care – PPO | Attending: Hematology & Oncology

## 2023-09-10 ENCOUNTER — Inpatient Hospital Stay: Payer: BC Managed Care – PPO

## 2023-09-10 ENCOUNTER — Inpatient Hospital Stay (HOSPITAL_BASED_OUTPATIENT_CLINIC_OR_DEPARTMENT_OTHER): Payer: BC Managed Care – PPO | Admitting: Hematology & Oncology

## 2023-09-10 VITALS — BP 132/83 | HR 83 | Temp 98.9°F | Resp 16 | Ht 65.0 in | Wt 180.0 lb

## 2023-09-10 DIAGNOSIS — Z87891 Personal history of nicotine dependence: Secondary | ICD-10-CM | POA: Insufficient documentation

## 2023-09-10 DIAGNOSIS — C3432 Malignant neoplasm of lower lobe, left bronchus or lung: Secondary | ICD-10-CM | POA: Diagnosis not present

## 2023-09-10 DIAGNOSIS — Z79620 Long term (current) use of immunosuppressive biologic: Secondary | ICD-10-CM | POA: Insufficient documentation

## 2023-09-10 DIAGNOSIS — Z79899 Other long term (current) drug therapy: Secondary | ICD-10-CM | POA: Diagnosis not present

## 2023-09-10 DIAGNOSIS — Z5112 Encounter for antineoplastic immunotherapy: Secondary | ICD-10-CM | POA: Insufficient documentation

## 2023-09-10 LAB — CBC WITH DIFFERENTIAL (CANCER CENTER ONLY)
Abs Immature Granulocytes: 0.14 10*3/uL — ABNORMAL HIGH (ref 0.00–0.07)
Basophils Absolute: 0.1 10*3/uL (ref 0.0–0.1)
Basophils Relative: 1 %
Eosinophils Absolute: 0.2 10*3/uL (ref 0.0–0.5)
Eosinophils Relative: 2 %
HCT: 43.3 % (ref 39.0–52.0)
Hemoglobin: 14.8 g/dL (ref 13.0–17.0)
Immature Granulocytes: 2 %
Lymphocytes Relative: 9 %
Lymphs Abs: 0.8 10*3/uL (ref 0.7–4.0)
MCH: 29.5 pg (ref 26.0–34.0)
MCHC: 34.2 g/dL (ref 30.0–36.0)
MCV: 86.3 fL (ref 80.0–100.0)
Monocytes Absolute: 0.7 10*3/uL (ref 0.1–1.0)
Monocytes Relative: 8 %
Neutro Abs: 6.4 10*3/uL (ref 1.7–7.7)
Neutrophils Relative %: 78 %
Platelet Count: 264 10*3/uL (ref 150–400)
RBC: 5.02 MIL/uL (ref 4.22–5.81)
RDW: 13.9 % (ref 11.5–15.5)
WBC Count: 8.3 10*3/uL (ref 4.0–10.5)
nRBC: 0 % (ref 0.0–0.2)

## 2023-09-10 LAB — CMP (CANCER CENTER ONLY)
ALT: 36 U/L (ref 0–44)
AST: 23 U/L (ref 15–41)
Albumin: 4.2 g/dL (ref 3.5–5.0)
Alkaline Phosphatase: 52 U/L (ref 38–126)
Anion gap: 7 (ref 5–15)
BUN: 11 mg/dL (ref 6–20)
CO2: 29 mmol/L (ref 22–32)
Calcium: 9.4 mg/dL (ref 8.9–10.3)
Chloride: 101 mmol/L (ref 98–111)
Creatinine: 0.92 mg/dL (ref 0.61–1.24)
GFR, Estimated: >60 mL/min (ref >60)
Glucose, Bld: 109 mg/dL — ABNORMAL HIGH (ref 70–99)
Potassium: 4 mmol/L (ref 3.5–5.1)
Sodium: 137 mmol/L (ref 135–145)
Total Bilirubin: 0.4 mg/dL (ref 0.0–1.2)
Total Protein: 7 g/dL (ref 6.5–8.1)

## 2023-09-10 LAB — TSH: TSH: 1.57 u[IU]/mL (ref 0.350–4.500)

## 2023-09-10 MED ORDER — SODIUM CHLORIDE 0.9% FLUSH
10.0000 mL | INTRAVENOUS | Status: DC | PRN
  Administered 2023-09-10: 10 mL

## 2023-09-10 MED ORDER — HEPARIN SOD (PORK) LOCK FLUSH 100 UNIT/ML IV SOLN
500.0000 [IU] | Freq: Once | INTRAVENOUS | Status: AC | PRN
  Administered 2023-09-10: 500 [IU]

## 2023-09-10 MED ORDER — SODIUM CHLORIDE 0.9 % IV SOLN
1500.0000 mg | Freq: Once | INTRAVENOUS | Status: AC
  Administered 2023-09-10: 1500 mg via INTRAVENOUS
  Filled 2023-09-10: qty 30

## 2023-09-10 MED ORDER — SODIUM CHLORIDE 0.9 % IV SOLN
Freq: Once | INTRAVENOUS | Status: AC
Start: 2023-09-10 — End: 2023-09-10

## 2023-09-10 NOTE — Progress Notes (Signed)
[facility] Hematology and Oncology Follow Up Visit  Brandon Phillips Brandon Phillips [id number] 1966/03/08 58 y.o. 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 twice daily-start on 12/06/2022 -changed to 2.5 mg p.o. twice daily on 05/12/2023 Status post radiosurgery-completed on 02/11/2023 Durvalumab 1500 mg IV monthly-s/p cycle #3-- start on 02/17/2023     Interim History:  Brandon Phillips is back for follow-up and Durvalumab treatment.  He still working.  He is quite busy at work.  So far, everything is doing pretty well with him at work.  He is tolerated the Durvalumab nicely.  I think his having no problems with breathing.  His appetite is good.  He has had no diarrhea.  He has had no rashes.  There is been no bleeding.  His last TSH was 2.2.  He has had no mouth sores.  He has had no headache.  He has had no bony pain.  Overall, I would say that his performance status is probably ECOG 1.   Wt Readings from Last 3 Encounters:  09/10/23 180 lb (81.6 kg)  08/13/23 177 lb (80.3 kg)  07/16/23 178 lb (80.7 kg)     Medications:  Current Outpatient Medications:    albuterol (PROVENTIL) (2.5 MG/3ML) 0.083% nebulizer solution, Take 3 mLs (2.5 mg total) by nebulization every 6 (six) hours as needed for wheezing or shortness of breath., Disp: 75 mL, Rfl: 5   apixaban (ELIQUIS) 2.5 MG TABS tablet, Take 1 tablet (2.5 mg total) by mouth 2 (two) times daily., Disp: 60 tablet, Rfl: 12   atorvastatin (LIPITOR) 20 MG tablet, TAKE ONE TABLET BY MOUTH DAILY, Disp: 90 tablet, Rfl: 3   Budeson-Glycopyrrol-Formoterol (BREZTRI AEROSPHERE) 160-9-4.8 MCG/ACT AERO, Inhale 2 puffs into the lungs in the morning and at bedtime., Disp: 1 each, Rfl: 11   COENZYME Q10 PO, Take 100 mg by mouth in the  morning., Disp: , Rfl:    gabapentin (NEURONTIN) 300 MG capsule, Take 1 capsule (300 mg total) by mouth 2 (two) times daily. TAKE 1 CAPSULE BY MOUTH EVERY NIGHT AT BEDTIME FOR 3 NIGHTS, THEN INCREASE TO TAKE 1 CAPSULE BY MOUTH 2 TIMES A DAY FOR 1 WEEK, THEN OK TO INCREASE TO TAKE 1 CAPSULE BY MOUTH 3 TIMES A DAY, Disp: 90 capsule, Rfl: 3   GUAIFENESIN 1200 PO, Take 1,200 mg by mouth in the morning and at bedtime., Disp: , Rfl:    Multiple Vitamin (MULTIVITAMIN) capsule, Take 1 capsule by mouth daily., Disp: , Rfl:    albuterol (VENTOLIN HFA) 108 (90 Base) MCG/ACT inhaler, Inhale 2 puffs into the lungs every 6 (six) hours as needed for wheezing. (Patient not taking: Reported on 07/16/2023), Disp: 2 each, Rfl: 11   lidocaine-prilocaine (EMLA) cream, Apply a dime size to port-a-cath 1-2 hours prior to access. Cover with saran wrap. (Patient not taking: Reported on 09/10/2023), Disp: 30 g, Rfl: 2   telmisartan (MICARDIS) 20 MG tablet, Take 1 tablet (20 mg total) by mouth daily., Disp: 30 tablet, Rfl: 2  Allergies: No Known Allergies  Past Medical History, Surgical history, Social history, and Family History were reviewed and updated.  Review of Systems: Review of Systems  Constitutional: Negative.   HENT:  Negative.    Eyes: Negative.   Respiratory:  Positive for cough.  Cardiovascular: Negative.   Gastrointestinal: Negative.   Endocrine: Negative.   Genitourinary: Negative.    Musculoskeletal: Negative.   Skin: Negative.   Neurological: Negative.   Hematological: Negative.   Psychiatric/Behavioral: Negative.      Physical Exam: His vital signs are temperature 98.9.  Pulse 83.  Blood pressure 132/83.  Weight is 180 pounds.    Wt Readings from Last 3 Encounters:  09/10/23 180 lb (81.6 kg)  08/13/23 177 lb (80.3 kg)  07/16/23 178 lb (80.7 kg)    Physical Exam Vitals reviewed.  HENT:     Head: Normocephalic and atraumatic.  Eyes:     Pupils: Pupils are equal, round, and reactive  to light.  Cardiovascular:     Rate and Rhythm: Normal rate and regular rhythm.     Heart sounds: Normal heart sounds.  Pulmonary:     Effort: Pulmonary effort is normal.     Breath sounds: Normal breath sounds.  Abdominal:     General: Bowel sounds are normal.     Palpations: Abdomen is soft.  Musculoskeletal:        General: No tenderness or deformity. Normal range of motion.     Cervical back: Normal range of motion.  Lymphadenopathy:     Cervical: No cervical adenopathy.  Skin:    General: Skin is warm and dry.     Findings: No erythema or rash.  Neurological:     Mental Status: He is alert and oriented to person, place, and time.  Psychiatric:        Behavior: Behavior normal.        Thought Content: Thought content normal.        Judgment: Judgment normal.    Lab Results  Component Value Date   WBC 8.3 09/10/2023   HGB 14.8 09/10/2023   HCT 43.3 09/10/2023   MCV 86.3 09/10/2023   PLT 264 09/10/2023     Chemistry      Component Value Date/Time   NA 137 09/10/2023 0931   NA 138 04/16/2023 1109   K 4.0 09/10/2023 0931   CL 101 09/10/2023 0931   CO2 29 09/10/2023 0931   BUN 11 09/10/2023 0931   BUN 11 04/16/2023 1109   CREATININE 0.92 09/10/2023 0931   CREATININE 1.05 01/11/2022 0000      Component Value Date/Time   CALCIUM 9.4 09/10/2023 0931   ALKPHOS 52 09/10/2023 0931   AST 23 09/10/2023 0931   ALT 36 09/10/2023 0931   BILITOT 0.4 09/10/2023 0931       Impression and Plan: Brandon Phillips is a very nice 58 year old white male.  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  Again, he does not wish to have surgery.  He has seen Dr. Dorris Fetch of thoracic surgery.  He completed radiation/chemotherapy.  He has responded but still, I think, there was residual disease. He has had radiosurgery.  We now have him on immunotherapy.  We will continue the Durvalumab.  I do not think needs another PET scan probably until late March or early April.  I  will plan to get him back in another 4 weeks.    [name], MD 2/12/202510:36 AM

## 2023-09-10 NOTE — Patient Instructions (Signed)
[facility] - A DEPT OF [facility]  Discharge Instructions: Thank you for choosing [facility] to provide your oncology and hematology care.   If you have a lab appointment with the Cancer Center, please go directly to the Cancer Center and check in at the registration area.  Wear comfortable clothing and clothing appropriate for easy access to any Portacath or PICC line.   We strive to give you quality time with your provider. You may need to reschedule your appointment if you arrive late (15 or more minutes).  Arriving late affects you and other patients whose appointments are after yours.  Also, if you miss three or more appointments without notifying the office, you may be dismissed from the clinic at the provider's discretion.      For prescription refill requests, have your pharmacy contact our office and allow 72 hours for refills to be completed.    Today you received the following chemotherapy and/or immunotherapy agents:  Imfinzi      To help prevent nausea and vomiting after your treatment, we encourage you to take your nausea medication as directed.  BELOW ARE SYMPTOMS THAT SHOULD BE REPORTED IMMEDIATELY: *FEVER GREATER THAN 100.4 F (38 C) OR HIGHER *CHILLS OR SWEATING *NAUSEA AND VOMITING THAT IS NOT CONTROLLED WITH YOUR NAUSEA MEDICATION *UNUSUAL SHORTNESS OF BREATH *UNUSUAL BRUISING OR BLEEDING *URINARY PROBLEMS (pain or burning when urinating, or frequent urination) *BOWEL PROBLEMS (unusual diarrhea, constipation, pain near the anus) TENDERNESS IN MOUTH AND THROAT WITH OR WITHOUT PRESENCE OF ULCERS (sore throat, sores in mouth, or a toothache) UNUSUAL RASH, SWELLING OR PAIN  UNUSUAL VAGINAL DISCHARGE OR ITCHING   Items with * indicate a potential emergency and should be followed up as soon as possible or go to the Emergency Department if any problems should occur.  Please show the CHEMOTHERAPY ALERT CARD or IMMUNOTHERAPY  ALERT CARD at check-in to the Emergency Department and triage nurse. Should you have questions after your visit or need to cancel or reschedule your appointment, please contact [facility] - A DEPT OF [facility]  [phone] and follow the prompts.  Office hours are 8:00 a.m. to 4:30 p.m. Monday - Friday. Please note that voicemails left after 4:00 p.m. may not be returned until the following business day.  We are closed weekends and major holidays. You have access to a nurse at all times for urgent questions. Please call the main number to the clinic [phone] and follow the prompts.  For any non-urgent questions, you may also contact your provider using MyChart. We now offer e-Visits for anyone 36 and older to request care online for non-urgent symptoms. For details visit [email or web address].   Also download the MyChart app! Go to the app store, search "MyChart", open the app, select [place], and log in with your MyChart username and password.

## 2023-09-12 ENCOUNTER — Other Ambulatory Visit: Payer: Self-pay | Admitting: *Deleted

## 2023-09-12 ENCOUNTER — Encounter: Payer: Self-pay | Admitting: Hematology & Oncology

## 2023-09-12 DIAGNOSIS — I10 Essential (primary) hypertension: Secondary | ICD-10-CM

## 2023-09-12 MED ORDER — TELMISARTAN 20 MG PO TABS: 20.0000 mg | ORAL_TABLET | Freq: Every day | ORAL | 1 refills | Status: DC

## 2023-09-17 ENCOUNTER — Encounter: Payer: Self-pay | Admitting: Internal Medicine

## 2023-09-18 ENCOUNTER — Encounter: Payer: Self-pay | Admitting: Adult Health

## 2023-09-18 ENCOUNTER — Ambulatory Visit (INDEPENDENT_AMBULATORY_CARE_PROVIDER_SITE_OTHER): Payer: BC Managed Care – PPO | Admitting: Podiatry

## 2023-09-18 ENCOUNTER — Encounter: Payer: Self-pay | Admitting: Podiatry

## 2023-09-18 ENCOUNTER — Other Ambulatory Visit: Payer: Self-pay

## 2023-09-18 DIAGNOSIS — M7752 Other enthesopathy of left foot: Secondary | ICD-10-CM | POA: Diagnosis not present

## 2023-09-18 DIAGNOSIS — J441 Chronic obstructive pulmonary disease with (acute) exacerbation: Secondary | ICD-10-CM

## 2023-09-18 DIAGNOSIS — M7751 Other enthesopathy of right foot: Secondary | ICD-10-CM

## 2023-09-18 DIAGNOSIS — M722 Plantar fascial fibromatosis: Secondary | ICD-10-CM | POA: Diagnosis not present

## 2023-09-18 DIAGNOSIS — J449 Chronic obstructive pulmonary disease, unspecified: Secondary | ICD-10-CM

## 2023-09-18 MED ORDER — DEXAMETHASONE SODIUM PHOSPHATE 120 MG/30ML IJ SOLN
4.0000 mg | Freq: Once | INTRAMUSCULAR | Status: AC
Start: 2023-09-18 — End: 2023-09-18
  Administered 2023-09-18: 4 mg via INTRA_ARTICULAR

## 2023-09-18 MED ORDER — TRIAMCINOLONE ACETONIDE 10 MG/ML IJ SUSP
2.5000 mg | Freq: Once | INTRAMUSCULAR | Status: AC
  Administered 2023-09-18: 2.5 mg via INTRA_ARTICULAR

## 2023-09-18 NOTE — Telephone Encounter (Signed)
Order was placed for ONO for patient to [facility] , pt was notified though my chart regarding.

## 2023-09-18 NOTE — Progress Notes (Signed)
  Subjective:  Patient ID: Brandon Phillips., male    DOB: 01-05-1966,   MRN: [id number]  No chief complaint on file.   58 y.o. male presents for follow-up of bilateral plantar fasciitis. Relates right doing better on the left but relates the shot seems to be wearing off. States he is still much improved but hoping for another shot. He also relates some pain in the ball of his right foot. Relates that has been going on since the beginning but has been worse lately and wondering if relates.  Denies any other pedal complaints. Denies n/v/f/c.   Past Medical History:  Diagnosis Date   Cancer (HCC)    lung cancer   CAP (community acquired pneumonia)    COPD (chronic obstructive pulmonary disease) (HCC)    Eczema    HLD (hyperlipidemia)    Primary cancer of left lower lobe of lung (HCC) 09/17/2022   Tobacco use disorder 01/24/2017    Objective:  Physical Exam: Vascular: DP/PT pulses 2/4 bilateral. CFT <3 seconds. Normal hair growth on digits. No edema.  Skin. No lacerations or abrasions bilateral feet.  Musculoskeletal: MMT 5/5 bilateral lower extremities in DF, PF, Inversion and Eversion. Deceased ROM in DF of ankle joint. Tender to the medial calcaneal tubercle bilaterally . No pain with achilles, PT or arch. No pain with calcaneal squeeze. More pain on left than right. Tender around the second third and fourth metatarsal heads. Some tenderness with ROM of the second third and fourth digits. No pain with metatarsal squeeze and negative mulder's click.  Neurological: Sensation intact to light touch.   Assessment:   1. Plantar fasciitis, bilateral   2. Capsulitis of toe of right foot       Plan:  Patient was evaluated and treated and all questions answered. Discussed plantar fasciitis and capsulitis with patient.  X-rays reviewed and discussed with patient. No acute fractures or dislocations noted. Minimal spurring noted at inferior calcaneus.  Discussed treatment options  including, ice, NSAIDS, supportive shoes, bracing, and stretching. Continue brace and stretching.    -Metatarsal pad provided for right foot to see if this helps.  Will avoid NSAIDs due to blood thinner and would like to avoid steroids as he is on immunotherapy.  Injection offered today. Procedure below.  Fitted for CMO today and will call when ready.  Follow-up 6 weeks or sooner if any problems arise. In the meantime, encouraged to call the office with any questions, concerns, change in symptoms.    Procedure: Injection Tendon/Ligament Discussed alternatives, risks, complications and verbal consent was obtained.  Location: Left plantar fascia . Skin Prep: Alcohol. Injectate: 1cc 0.5% marcaine plain, 1 cc dexamethasone 0.5 cc kenalog  Disposition: Patient tolerated procedure well. Injection site dressed with a band-aid.  Post-injection care was discussed and return precautions discussed.     Louann Sjogren, DPM

## 2023-09-18 NOTE — Telephone Encounter (Signed)
I am still here? I'm not sure what [facility] is talking about regarding that. I let patient know. Ok to re-order ONO on 2 lpm supplemental oxygen under my name. Thanks!

## 2023-09-20 DIAGNOSIS — E785 Hyperlipidemia, unspecified: Secondary | ICD-10-CM | POA: Diagnosis not present

## 2023-09-20 DIAGNOSIS — J449 Chronic obstructive pulmonary disease, unspecified: Secondary | ICD-10-CM | POA: Diagnosis not present

## 2023-09-20 DIAGNOSIS — R490 Dysphonia: Secondary | ICD-10-CM | POA: Diagnosis not present

## 2023-09-21 DIAGNOSIS — G4730 Sleep apnea, unspecified: Secondary | ICD-10-CM | POA: Diagnosis not present

## 2023-09-21 DIAGNOSIS — R0902 Hypoxemia: Secondary | ICD-10-CM | POA: Diagnosis not present

## 2023-09-22 NOTE — Progress Notes (Signed)
  Orthotic order placed will schedule for fitting when in

## 2023-09-24 ENCOUNTER — Ambulatory Visit: Payer: BC Managed Care – PPO | Admitting: Internal Medicine

## 2023-09-24 ENCOUNTER — Encounter: Payer: Self-pay | Admitting: Internal Medicine

## 2023-09-24 VITALS — BP 121/82 | HR 87 | Temp 98.3°F | Ht 65.0 in | Wt 183.0 lb

## 2023-09-24 DIAGNOSIS — Z87891 Personal history of nicotine dependence: Secondary | ICD-10-CM | POA: Diagnosis not present

## 2023-09-24 DIAGNOSIS — Z86711 Personal history of pulmonary embolism: Secondary | ICD-10-CM

## 2023-09-24 DIAGNOSIS — Z23 Encounter for immunization: Secondary | ICD-10-CM

## 2023-09-24 DIAGNOSIS — R400 Somnolence: Secondary | ICD-10-CM | POA: Diagnosis not present

## 2023-09-24 DIAGNOSIS — J449 Chronic obstructive pulmonary disease, unspecified: Secondary | ICD-10-CM | POA: Diagnosis not present

## 2023-09-24 DIAGNOSIS — Z85118 Personal history of other malignant neoplasm of bronchus and lung: Secondary | ICD-10-CM | POA: Diagnosis not present

## 2023-09-24 DIAGNOSIS — Z87898 Personal history of other specified conditions: Secondary | ICD-10-CM

## 2023-09-24 DIAGNOSIS — J441 Chronic obstructive pulmonary disease with (acute) exacerbation: Secondary | ICD-10-CM

## 2023-09-24 MED ORDER — PREDNISONE 10 MG PO TABS: ORAL_TABLET | ORAL | 0 refills | Status: DC

## 2023-09-24 MED ORDER — DOXYCYCLINE HYCLATE 100 MG PO TABS: 100.0000 mg | ORAL_TABLET | Freq: Two times a day (BID) | ORAL | 0 refills | Status: DC

## 2023-09-24 NOTE — Patient Instructions (Addendum)
 ICD-10-CM   1. Stage 3 severe COPD by GOLD classification (HCC)  J44.9 Ambulatory referral to Pulmonology    2. COPD, frequent exacerbations (HCC)  J44.1 Ambulatory referral to Pulmonology    3. Former heavy cigarette smoker (20-39 per day)  Z87.891 Ambulatory referral to Pulmonology    4. History of lung cancer  Z85.118 Ambulatory referral to Pulmonology    5. History of snoring  Z87.898 Ambulatory referral to Pulmonology    6. Daytime somnolence  R40.0 Ambulatory referral to Pulmonology        Stage 3 severe COPD by GOLD classification (HCC) - Plan: Pulse oximetry, overnight COPD, frequent exacerbations (HCC)  -Currently stable without any flareup.  Previous eosinophils normal excluding you from Dupixent therapy for repeated exacerbations.  Currently having a mild to moderate exacerbation from a recent head cold.  Plan - Take doxycycline 100mg  po twice daily x 5 days; take after meals and avoid sunlight - Please take prednisone 40 mg x1 day, then 30 mg x1 day, then 20 mg x1 day, then 10 mg x1 day, and then 5 mg x1 day and stop - Continue Breztri 2 puff 2 times daily - START OTHUVAYRE - You do not need oxygen for rest and also exertion up to 1 flight of stairs  [facility]  Plan   - prevnar 09/24/2023   Former heavy cigarette smoker (20-39 per day) History of lung cancer stage IIIb non-small cell  Plan  - Per Dr. Myna Hidalgo  History of snoring Daytime somnolence  -Overnight pulse oximetry study ordered in July 2024 by Florentina Addison and done on 09/21/2023 shows at 2 L you desaturate below 88% for 1 minute and 5 seconds suggesting 2 L of night oxygen as required.  Still we need to rule out sleep apnea  Plan - Refer Dr. Vassie Loll or Dr. Val Eagle  --Till then continue 2 L at night.  History of pulmonary embolism May 2024 without RV strain  Plan - Continue Eliquis -AT followup check d-dimer     Follow-up -Refer sleep doctor -Return to see Dr. Marchelle Gearing in 4 months

## 2023-09-24 NOTE — Progress Notes (Signed)
 10/29/2022: OV with Dr. Thora Lance.  Undergoing radiation for stage IIb NSCLC left lower lobe, neoadjuvant chemotherapy with subsequent plan for restaging.  Resection potentially offered though he is hopeful to avoid surgery.  Does feel like his dyspnea has been worse since last visit.  He has had cough with yellow sputum production over the last 6 days.  He also increased pain with swallowing since starting XRT.  Treated for AE COPD with Z-Pak and prednisone taper.  Switched from Symbicort to [employer].  Advised to call if symptoms did not improve.  11/19/2022: OV with Cobb NP for acute visit with his wife.  He was seen 4/2 and treated for a COPD with Z-Pak and prednisone taper.  He tells me that when he was on the prednisone, he was feeling a little bit better.  His wife was not noticing him wheeze.  Since he has finished these medications, he feels like he is more short of breath, has more chest congestion and wheezing and is still producing yellow phlegm.  Cough tends to be worse at night and then in the morning when he first gets up.  He does have some nasal drainage.  Denies any fevers, night sweats, hemoptysis, lower extremity swelling, orthopnea.  No interim sick exposures.  Using [place] twice daily.  Does not have a neb machine at home. Eating and drinking well.   12/04/2022:  OV with Cobb NP for follow up. We treated him for unresolved acute bronchitis/AECOPD at his last visit with doxycycline course and prednisone taper. He had previously had good response to the steroids but flared back up once off. His CXR did not show an acute process. He was also recommended to start intranasal steroid for postnasal drainage and cough control measures.  Today, he tells me that he is some better but still having some chest congestion and cough. His cough is mostly dry but occasionally producing some white phlegm at times. Does have chest discomfort with the cough at times. His heart rate is up compared to his  last visit. They haven't noticed much wheezing. Breathing feels better but still more short winded than his baseline. He is having some increased voice hoarseness. Denies fevers, chills, hemoptysis, leg swelling, calf pain, palpitations. He is using Clinical cytogeneticist. He's not sure if the Markus Daft is making him cough more and wonders if he should go back to his Symbicort. Doesn't notice an increase cough right after he uses it. He is using flonase but he thinks this makes him drain more. Still using his neb treatments twice a day, which help some. Not using any cough suppressants as he was worried that he shouldn't be suppressing his cough.  D dimer positive - CTA positive for subsegmental PE. Admitted for treatment. No right heart strain  12/19/2022: OV with Dr. Thora Lance. Started on Eliquis for subsegmental RLL PE. Doing ok now. Quite a bit of chest congestion. Using breztri bid. Advised to use mucinex and flutter valve for chest congestion. Upcoming restaging PET/CT. F/u 8 weeks or sooner.  02/13/2023: OV with Cobb NP for intended follow up but he is also struggling with some acute symptoms. He's had increased shortness of breath over the past week or two. He gets winded just walking from the parking lot into the building. He is coughing some more. Producing some white phlegm. He does have some wheezing at times. His wife says that he had some low O2 levels over the weekend, down to 84% with exertion. He  denies any fevers, chills, hemoptysis, leg swelling, calf pain, orthopnea, CP. He has not had any missed doses of Eliquis. No excessive bruising or bleeding. He is using his albuterol twice a day and neb twice a week. Still on Breztri.   02/20/2023: Today - follow up Patient presents today for follow up with his wife. He was treated for AECOPD with z pack and prednisone taper, and started on supplemental oxygen at our last visit. He is feeling some better today. Chest feels clearer. He's unsure if he's notice a huge  difference in his breathing but his activity tolerance is some improved. Cough is mostly resolved. Not noticing as much wheezing. He's still having drops in his oxygen with activity; SpO2 low 86% on room air. He will put his oxygen on when he notices they're low. Not sleeping with it every night. He has not missed any doses of Eliquis. Labs were nl aside from elevated WBC, likely due to steroid use as CXR was clear.  58 year old male, former smoker followed for NSCLC of left lung and COPD. He is a patient of Dr. [name] and last seen in office 02/13/2023 by [name] NP. Past medical history significant for HTN, allergic rhinitis.   TEST/EVENTS:  07/25/2022 LDCT chest: Atherosclerosis.  Spiculated solid central left lower lobe lung mass measuring 35 mm, new.  Solid peripheral right upper lobe pulmonary nodule measuring 7.9 mm. 08/14/2022 PET scan: Low-level hypermetabolism of the right upper lobe pulmonary nodule.  Hypermetabolism of the left lower lobe lung mass and ipsilateral hilar nodal metastasis. 08/26/2022 PFT: FVC 66, FEV1 42, ratio 48, TLC 122, DLCOcor 57.  No BD. 11/19/2022 CXR: stable left lower lobe mass. New right-sided chest wall port.  12/04/2022 CTA chest: small subsegmental RLL PE. LLL perihilar mass, 2.2x1.8 cm. No LAD. No acute consolidation.  01/01/2023 PET: LLL nodule with intense hypermetabolism despite decrease in size. Hypermetabolic left hilar node. Intense metabolic activity in right shoulder AC joint, no CT lesion evident and favors non-neoplastic activity. No distant metastatic disease. Post radiation change in marrow of lower thoracic spine.  02/13/2023 CXR: unchanged left perihilar mass. No acute process  OV 04/15/2023-transfer of care from Dr. Felisa Bonier to Dr. Marchelle Gearing after Dr. Stacie Acres relocated to [facility]  Subjective:  Patient ID: [name]., male , DOB: 10/23/1965 , age 58 y.o. , MRN: [id number] , ADDRESS: [address] [place] [address] PCP [name], NP Patient Care Team: [name], NP as PCP - General (Nurse Practitioner) [name], MD as Medical Oncologist (Oncology) Gwendel Hanson, RN as Oncology Nurse Navigator  This Provider for this visit: Treatment Team:  Attending Provider: Kalman Shan, MD    04/15/2023 -   Chief Complaint  Patient presents with   Follow-up    Former pt of Dr. Rise Paganini. Breathing is doing well overall. He still has occ cough- prod with yellow sputum.      HPI Shaye Lagace. 58 y.o. -returns for follow-up.  Presents with his wife is an independent historian.  This is to establish care but also several issues  #Gold stage III COPD: Determinant's pulmonary function test January 2024.  He says he has diagnosis for 2 years.  He was on Symbicort but a few months ago was changed to [employer].  Currently deems COPD to be stable.  He says his dyspnea is worse than compared to 6 months ago but very similar to the last 1 or 2 months.  He is  in need of alpha-1 antitrypsin check.  #Recurrent COPD exacerbations.  In 2022 3 no prednisone usage.  But in 2024 he has been on prednisone at least 3 times.  This is for COPD exacerbation.  Review of the records indicate his blood eosinophils are normal.  It appears at least in the last month or 2 that he has had no COPD exacerbations.  #Hypoxemia: He was prescribed oxygen because of desaturations with exertion but today with 15 sit/stand which is like climbing a flight of stairs he did not desaturate at all.  This is on room air.  His employer is not letting him work because we wrote for him to be on oxygen all the time.  He is at risk of losing the job he is not work no in 2 weeks.  He works at a [employer].  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  And he can easily do this job.  At this point in time I told him he does not need oxygen to do the job unless it subjective needs or he is actually  desaturating which she is not.  #History of small pulmonary embolism May 2024: Continues on Eliquis no further bleeding  #History of lung cancer stage IIIb non-small cell: Has seen Dr. Myna Hidalgo.  Note reviewed from yesterday.  #Possible sleep apnea: Wife gives a history of snoring.  Katie Cobb was supposed to order night oxygen but appartly the test for overnight pulse oximetry was never executed.  Is ordered did not get executed the DME company did not call them.  In any event they said there is a lot of snoring and excessive daytime somnolence.  Therefore referred to sleep doctor  #X of symptoms of shortness of breath and fatigue: This is multifactorial.  His hemoglobin is normal earlier this year echo was normal.  I had discussed pulmonary rehabilitation and they are very interested.  OV 09/24/2023  Subjective:  Patient ID: [name]., male , DOB: 27-Jul-1966 , age 8 y.o. , MRN: [id number] , AD[address]ESS: [address] [place] [address] PCP [name], NP Patient Care Team: [name], NP as PCP - General (Nurse Practitioner) [name], MD as Medical Oncologist (Oncology)  This Provider for this visit: Treatment Team:  Attending Provider: Kalman Shan, MD    09/24/2023 -   Chief Complaint  Patient presents with   Follow-up    Breathing is overall doing well. He has had increased cough over the past wks. Cough is worse at night and is non prod. He asked about getting a pneumovax.      HPI Rockney Grenz. 58 y.o. -  Follow-up   #Gold stage III COPD [FEV1 1.33 L / 42%, alpha-1 MM] -on nighttime oxygen #Recurrent COPD exacerbation -normal blood eosinophils # Possible/likely high pretest probability for sleep apnea -not seeing sleep doctor #Small pulmonary embolism May 2024 on: On Eliquis #Stage IIIb non-small cell lung cancer on immunotherapy  -last CT scan September 2024   Presents with his wife for routine follow-up.  Some 3 weeks ago he  picked up a head cold and had yellow sputum.  He feels like he recovered from it but the wife later said that he is having slightly increased cough than baseline although he is better.  He is also having sputum that is yellow which is a different color.  Also at night to hear whistling when she is sitting next to him.  He has not treated himself with this.  However since the last office visit no hospitalizations no emergency room visits no urgent care visits.  Immunotherapy continues.  He did have overnight pulse oximetry test done on 2 L oxygen.  It appears this was ordered by Florentina Addison our nurse practitioner back in July 2024 but was only done on September 20, 2022 if.  His pulse ox less than or equal to 88% is warm 1 minute and 5 seconds suggesting he benefits from nighttime oxygen at 2 L and that is adequate.  Also noted that he has not seen a sleep specialist as yet.  I will make a rereferral.  Will treat him for a COPD exacerbation.  We talked about recurrent COPD exacerbations.  Told him he does not qualify for Dupixent we discussed the new nebulizer OTHUVYARE benefits and risks and limitations and he is interested in this.  He continues to work and is functional.   Simple office walk 224 (66+46 x 2) feet Pod A at [facility] x  3 laps goal with forehead probe 04/15/2023 Eco 1   O2 used ra   Number laps completed Sit and stand x 15   Comments about pace good   Resting Pulse Ox/HR 96% and 96/min   Final Pulse Ox/HR 92% and 106/min immedite -> 90% and HR 104   Desaturated </= 88% no   Desaturated <= 3% points yes   Got Tachycardic >/= 90/min yes   Symptoms at end of test Yes dyspneic   Miscellaneous comments x     PFT     Latest Ref Rng & Units 08/26/2022    3:25 PM  PFT Results  FVC-Pre L 2.75   FVC-Predicted Pre % 66   FVC-Post L 2.77   FVC-Predicted Post % 67   Pre FEV1/FVC % % 48   Post FEV1/FCV % % 48   FEV1-Pre L 1.33   FEV1-Predicted Pre % 42   FEV1-Post L 1.33   DLCO uncorrected  ml/min/mmHg 15.07   DLCO UNC% % 61   DLCO corrected ml/min/mmHg 14.04   DLCO COR %Predicted % 57   DLVA Predicted % 60   TLC L 7.30   TLC % Predicted % 122   RV % Predicted % 235     No Known Allergies Or known   LAB RESULTS last 96 hours No results found.       has a past medical history of Cancer (HCC), CAP (community acquired pneumonia), COPD (chronic obstructive pulmonary disease) (HCC), Eczema, HLD (hyperlipidemia), Primary cancer of left lower lobe of lung (HCC) (09/17/2022), and Tobacco use disorder (01/24/2017).   reports that he quit smoking about 6 years ago. His smoking use included cigarettes. He started smoking about 41 years ago. He has a 52.5 pack-year smoking history. He has never used smokeless tobacco.  Past Surgical History:  Procedure Laterality Date   BRONCHIAL BIOPSY  08/29/2022   Procedure: BRONCHIAL BIOPSIES;  Surgeon: [name], MD;  Location: [facility] ENDOSCOPY;  Service: Pulmonary;;   BRONCHIAL BRUSHINGS  08/29/2022   Procedure: BRONCHIAL BRUSHINGS;  Surgeon: [name], MD;  Location: [facility] ENDOSCOPY;  Service: Pulmonary;;   BRONCHIAL NEEDLE ASPIRATION BIOPSY  08/29/2022   Procedure: BRONCHIAL NEEDLE ASPIRATION BIOPSIES;  Surgeon: [name], MD;  Location: [facility] ENDOSCOPY;  Service: Pulmonary;;   BRONCHIAL WASHINGS  08/29/2022   Procedure: BRONCHIAL WASHINGS;  Surgeon: [name], MD;  Location[facility] ENDOSCOPY;  Service: Pulmonary;;   FIDUCIAL MARKER PLACEMENT  08/29/2022   Procedure: FIDUCIAL MARKER PLACEMENT;  Surgeon: [name], MD;  Location: [facility] ENDOSCOPY;  Service: Pulmonary;;   HERNIA REPAIR     as an infant   IR IMAGING GUIDED PORT INSERTION  10/03/2022   IR RADIOLOGIST EVAL & MGMT  10/21/2022   VIDEO BRONCHOSCOPY WITH ENDOBRONCHIAL ULTRASOUND  08/29/2022   Procedure: VIDEO BRONCHOSCOPY WITH ENDOBRONCHIAL ULTRASOUND;  Surgeon: [name], MD;  Location: [facility] ENDOSCOPY;  Service: Pulmonary;;    No Known  Allergies  Immunization History  Administered Date(s) Administered   PFIZER(Purple Top)SARS-COV-2 Vaccination 08/03/2020, 08/25/2020   PNEUMOCOCCAL CONJUGATE-20 09/24/2023   Pneumococcal Polysaccharide-23 01/15/2018   Tdap 01/15/2018   Zoster Recombinant(Shingrix) 07/30/2019, 11/27/2019    No family history on file.   Current Outpatient Medications:    albuterol (PROVENTIL) (2.5 MG/3ML) 0.083% nebulizer solution, Take 3 mLs (2.5 mg total) by nebulization every 6 (six) hours as needed for wheezing or shortness of breath., Disp: 75 mL, Rfl: 5   albuterol (VENTOLIN HFA) 108 (90 Base) MCG/ACT inhaler, Inhale 2 puffs into the lungs every 6 (six) hours as needed for wheezing., Disp: 2 each, Rfl: 11   apixaban (ELIQUIS) 2.5 MG TABS tablet, Take 1 tablet (2.5 mg total) by mouth 2 (two) times daily., Disp: 60 tablet, Rfl: 12   atorvastatin (LIPITOR) 20 MG tablet, TAKE ONE TABLET BY MOUTH DAILY, Disp: 90 tablet, Rfl: 3   Budeson-Glycopyrrol-Formoterol (BREZTRI AEROSPHERE) 160-9-4.8 MCG/ACT AERO, Inhale 2 puffs into the lungs in the morning and at bedtime., Disp: 1 each, Rfl: 11   COENZYME Q10 PO, Take 100 mg by mouth in the morning., Disp: , Rfl:    doxycycline (VIBRA-TABS) 100 MG tablet, Take 1 tablet (100 mg total) by mouth 2 (two) times daily., Disp: 10 tablet, Rfl: 0   gabapentin (NEURONTIN) 300 MG capsule, Take 1 capsule (300 mg total) by mouth 2 (two) times daily. TAKE 1 CAPSULE BY MOUTH EVERY NIGHT AT BEDTIME FOR 3 NIGHTS, THEN INCREASE TO TAKE 1 CAPSULE BY MOUTH 2 TIMES A DAY FOR 1 WEEK, THEN OK TO INCREASE TO TAKE 1 CAPSULE BY MOUTH 3 TIMES A DAY, Disp: 90 capsule, Rfl: 3   GUAIFENESIN 1200 PO, Take 1,200 mg by mouth in the morning and at bedtime., Disp: , Rfl:    lidocaine-prilocaine (EMLA) cream, Apply a dime size to port-a-cath 1-2 hours prior to access. Cover with saran wrap., Disp: 30 g, Rfl: 2   Multiple Vitamin (MULTIVITAMIN) capsule, Take 1 capsule by mouth daily., Disp: , Rfl:     predniSONE (DELTASONE) 10 MG tablet, Please take prednisone 40 mg x1 day, then 30 mg x1 day, then 20 mg x1 day, then 10 mg x1 day, and then 5 mg x1 day and stop, Disp: 10 tablet, Rfl: 0   telmisartan (MICARDIS) 20 MG tablet, Take 1 tablet (20 mg total) by mouth daily., Disp: 90 tablet, Rfl: 1      Objective:   Vitals:   09/24/23 1618  BP: 121/82  Pulse: 87  Temp: 98.3 F (36.8 C)  TempSrc: Oral  SpO2: 92%  Weight: 183 lb (83 kg)  Height: 5\' 5"  (1.651 m)    Estimated body mass index is 30.45 kg/m as calculated from the following:   Height as of this encounter: 5\' 5"  (1.651 m).   Weight as of this encounter: 183 lb (83 kg).  @WEIGHTCHANGE @  [employer]   09/24/23 1618  Weight: 183 lb (83 kg)     Physical Exam   General:  No distress. Looks well O2 at rest: no Cane present: no Sitting in wheel chair: no Frail: no Obese: no Neuro: Alert and Oriented x 3. GCS 15. Speech normal Psych: Pleasant Resp:  Barrel Chest - no.  Wheeze - YES, Crackles - n, No overt respiratory distress CVS: Normal heart sounds. Murmurs - no Ext: Stigmata of Connective Tissue Disease - no HEENT: Normal upper airway. PEERL +. No post nasal drip        Assessment:       ICD-10-CM   1. Stage 3 severe COPD by GOLD classification (HCC)  J44.9 Ambulatory referral to Pulmonology    2. COPD, frequent exacerbations (HCC)  J44.1 Ambulatory referral to Pulmonology    3. Former heavy cigarette smoker (20-39 per day)  Z87.891 Ambulatory referral to Pulmonology    4. History of lung cancer  Z85.118 Ambulatory referral to Pulmonology    5. History of snoring  Z87.898 Ambulatory referral to Pulmonology    6. Daytime somnolence  R40.0 Ambulatory referral to Pulmonology    7. Need for pneumococcal 20-valent conjugate vaccination  Z23 Pneumococcal conjugate vaccine 20-valent (Prevnar-20)         Plan:     Patient Instructions     ICD-10-CM   1. Stage 3 severe COPD by GOLD classification  (HCC)  J44.9 Ambulatory referral to Pulmonology    2. COPD, frequent exacerbations (HCC)  J44.1 Ambulatory referral to Pulmonology    3. Former heavy cigarette smoker (20-39 per day)  Z87.891 Ambulatory referral to Pulmonology    4. History of lung cancer  Z85.118 Ambulatory referral to Pulmonology    5. History of snoring  Z87.898 Ambulatory referral to Pulmonology    6. Daytime somnolence  R40.0 Ambulatory referral to Pulmonology        Stage 3 severe COPD by GOLD classification (HCC) - Plan: Pulse oximetry, overnight COPD, frequent exacerbations (HCC)  -Currently stable without any flareup.  Previous eosinophils normal excluding you from Dupixent therapy for repeated exacerbations.  Currently having a mild to moderate exacerbation from a recent head cold.  Plan - Take doxycycline 100mg  po twice daily x 5 days; take after meals and avoid sunlight - Please take prednisone 40 mg x1 day, then 30 mg x1 day, then 20 mg x1 day, then 10 mg x1 day, and then 5 mg x1 day and stop - Continue Breztri 2 puff 2 times daily - START OTHUVAYRE - You do not need oxygen for rest and also exertion up to 1 flight of stairs  Vaccine Counseling  Plan   - prevnar 09/24/2023   Former heavy cigarette smoker (20-39 per day) History of lung cancer stage IIIb non-small cell  Plan  - Per Dr. Myna Hidalgo  History of snoring Daytime somnolence  -Overnight pulse oximetry study ordered in July 2024 by Florentina Addison and done on 09/21/2023 shows at 2 L you desaturate below 88% for 1 minute and 5 seconds suggesting 2 L of night oxygen as required.  Still we need to rule out sleep apnea  Plan - Refer Dr. Vassie Loll or Dr. Val Eagle  --Till then continue 2 L at night.  History of pulmonary embolism May 2024 without RV strain  Plan - Continue Eliquis -AT followup check d-dimer     Follow-up -Refer sleep doctor -Return to see Dr. Marchelle Gearing in 4 months   FOLLOWUP Return in about 4 months (around 01/22/2024) for Sleep doc  referral + in 4 months x 15 min visit, with Dr Marchelle Gearing, Face to Face  Visit.    SIGNATURE    Dr. Kalman Shan, M.D., F.C.C.P,  Pulmonary and Critical Care Medicine Staff Physician, [facility] Director - Interstitial Lung Disease  Program  Pulmonary Fibrosis [facility] at [facility] [place], [place], [address]  Pager: [phone], If no answer or between  15:00h - 7:00h: call [phone] Telephone: [phone]  5:51 PM 09/24/2023

## 2023-09-26 DIAGNOSIS — R490 Dysphonia: Secondary | ICD-10-CM | POA: Diagnosis not present

## 2023-09-26 DIAGNOSIS — J449 Chronic obstructive pulmonary disease, unspecified: Secondary | ICD-10-CM | POA: Diagnosis not present

## 2023-09-26 DIAGNOSIS — E785 Hyperlipidemia, unspecified: Secondary | ICD-10-CM | POA: Diagnosis not present

## 2023-09-29 ENCOUNTER — Other Ambulatory Visit (HOSPITAL_COMMUNITY): Payer: Self-pay

## 2023-09-29 ENCOUNTER — Telehealth: Payer: Self-pay

## 2023-09-29 NOTE — Telephone Encounter (Signed)
 Received New start paperwork for [facility]. Will update as we work through the benefits process.  Completed missing sections and compiled supporting documentation. Submitted Patient Assistance Application to [employer] for [facility] along with provider portion, patient portion, PA, medication list, insurance card copy and income documents. Will update patient when we receive a response.  Phone: [phone] Fax: [phone]

## 2023-10-03 NOTE — Telephone Encounter (Signed)
 PA renewal initiated automatically by CoverMyMeds.  Submitted a Prior Authorization request to [facility] for [facility] via CoverMyMeds. Will update once we receive a response.   Key: [id number]

## 2023-10-03 NOTE — Telephone Encounter (Signed)
 [facility] is calling. [place] requires prior authorization.

## 2023-10-10 ENCOUNTER — Inpatient Hospital Stay: Payer: BC Managed Care – PPO | Admitting: Hematology & Oncology

## 2023-10-10 ENCOUNTER — Encounter: Payer: Self-pay | Admitting: Hematology & Oncology

## 2023-10-10 ENCOUNTER — Other Ambulatory Visit: Payer: Self-pay

## 2023-10-10 ENCOUNTER — Inpatient Hospital Stay: Payer: BC Managed Care – PPO

## 2023-10-10 ENCOUNTER — Emergency Department (HOSPITAL_BASED_OUTPATIENT_CLINIC_OR_DEPARTMENT_OTHER)

## 2023-10-10 ENCOUNTER — Other Ambulatory Visit: Payer: Self-pay | Admitting: Hematology & Oncology

## 2023-10-10 ENCOUNTER — Inpatient Hospital Stay (HOSPITAL_BASED_OUTPATIENT_CLINIC_OR_DEPARTMENT_OTHER)
Admission: EM | Admit: 2023-10-10 | Discharge: 2023-10-14 | DRG: 287 | Disposition: A | Discharge status: Home / Self Care | Attending: Family Medicine | Admitting: Family Medicine

## 2023-10-10 VITALS — BP 136/85 | HR 73 | Temp 98.0°F | Resp 18 | Ht 65.0 in | Wt 180.0 lb

## 2023-10-10 DIAGNOSIS — C3490 Malignant neoplasm of unspecified part of unspecified bronchus or lung: Secondary | ICD-10-CM | POA: Diagnosis present

## 2023-10-10 DIAGNOSIS — D6859 Other primary thrombophilia: Secondary | ICD-10-CM | POA: Diagnosis present

## 2023-10-10 DIAGNOSIS — R918 Other nonspecific abnormal finding of lung field: Secondary | ICD-10-CM | POA: Diagnosis not present

## 2023-10-10 DIAGNOSIS — I10 Essential (primary) hypertension: Secondary | ICD-10-CM | POA: Diagnosis not present

## 2023-10-10 DIAGNOSIS — I2510 Atherosclerotic heart disease of native coronary artery without angina pectoris: Secondary | ICD-10-CM | POA: Diagnosis not present

## 2023-10-10 DIAGNOSIS — E871 Hypo-osmolality and hyponatremia: Secondary | ICD-10-CM | POA: Diagnosis not present

## 2023-10-10 DIAGNOSIS — R079 Chest pain, unspecified: Secondary | ICD-10-CM | POA: Diagnosis not present

## 2023-10-10 DIAGNOSIS — C3432 Malignant neoplasm of lower lobe, left bronchus or lung: Secondary | ICD-10-CM

## 2023-10-10 DIAGNOSIS — I201 Angina pectoris with documented spasm: Secondary | ICD-10-CM | POA: Diagnosis not present

## 2023-10-10 DIAGNOSIS — Z9221 Personal history of antineoplastic chemotherapy: Secondary | ICD-10-CM | POA: Diagnosis not present

## 2023-10-10 DIAGNOSIS — R911 Solitary pulmonary nodule: Secondary | ICD-10-CM | POA: Diagnosis not present

## 2023-10-10 DIAGNOSIS — Z86718 Personal history of other venous thrombosis and embolism: Secondary | ICD-10-CM | POA: Diagnosis not present

## 2023-10-10 DIAGNOSIS — Z7901 Long term (current) use of anticoagulants: Secondary | ICD-10-CM | POA: Diagnosis not present

## 2023-10-10 DIAGNOSIS — Z884 Allergy status to anesthetic agent status: Secondary | ICD-10-CM | POA: Diagnosis not present

## 2023-10-10 DIAGNOSIS — I471 Supraventricular tachycardia, unspecified: Secondary | ICD-10-CM | POA: Diagnosis present

## 2023-10-10 DIAGNOSIS — E7800 Pure hypercholesterolemia, unspecified: Secondary | ICD-10-CM | POA: Diagnosis not present

## 2023-10-10 DIAGNOSIS — I4729 Other ventricular tachycardia: Secondary | ICD-10-CM | POA: Diagnosis not present

## 2023-10-10 DIAGNOSIS — R6884 Jaw pain: Secondary | ICD-10-CM | POA: Diagnosis present

## 2023-10-10 DIAGNOSIS — C3492 Malignant neoplasm of unspecified part of left bronchus or lung: Secondary | ICD-10-CM | POA: Diagnosis not present

## 2023-10-10 DIAGNOSIS — I700 Atherosclerosis of aorta: Secondary | ICD-10-CM | POA: Diagnosis present

## 2023-10-10 DIAGNOSIS — Z79899 Other long term (current) drug therapy: Secondary | ICD-10-CM | POA: Diagnosis not present

## 2023-10-10 DIAGNOSIS — Z7985 Long-term (current) use of injectable non-insulin antidiabetic drugs: Secondary | ICD-10-CM | POA: Diagnosis not present

## 2023-10-10 DIAGNOSIS — I200 Unstable angina: Principal | ICD-10-CM | POA: Diagnosis present

## 2023-10-10 DIAGNOSIS — Z888 Allergy status to other drugs, medicaments and biological substances status: Secondary | ICD-10-CM

## 2023-10-10 DIAGNOSIS — I25111 Atherosclerotic heart disease of native coronary artery with angina pectoris with documented spasm: Secondary | ICD-10-CM | POA: Diagnosis not present

## 2023-10-10 DIAGNOSIS — J432 Centrilobular emphysema: Secondary | ICD-10-CM | POA: Diagnosis not present

## 2023-10-10 DIAGNOSIS — Z923 Personal history of irradiation: Secondary | ICD-10-CM

## 2023-10-10 DIAGNOSIS — Z79620 Long term (current) use of immunosuppressive biologic: Secondary | ICD-10-CM | POA: Diagnosis not present

## 2023-10-10 DIAGNOSIS — Z86711 Personal history of pulmonary embolism: Secondary | ICD-10-CM

## 2023-10-10 DIAGNOSIS — J9611 Chronic respiratory failure with hypoxia: Secondary | ICD-10-CM | POA: Diagnosis not present

## 2023-10-10 DIAGNOSIS — I472 Ventricular tachycardia, unspecified: Secondary | ICD-10-CM | POA: Diagnosis present

## 2023-10-10 DIAGNOSIS — Z87891 Personal history of nicotine dependence: Secondary | ICD-10-CM | POA: Insufficient documentation

## 2023-10-10 DIAGNOSIS — J449 Chronic obstructive pulmonary disease, unspecified: Secondary | ICD-10-CM | POA: Diagnosis present

## 2023-10-10 DIAGNOSIS — E781 Pure hyperglyceridemia: Secondary | ICD-10-CM | POA: Diagnosis present

## 2023-10-10 DIAGNOSIS — Z85118 Personal history of other malignant neoplasm of bronchus and lung: Secondary | ICD-10-CM

## 2023-10-10 LAB — CBC WITH DIFFERENTIAL (CANCER CENTER ONLY)
Abs Immature Granulocytes: 0.04 10*3/uL (ref 0.00–0.07)
Basophils Absolute: 0 10*3/uL (ref 0.0–0.1)
Basophils Relative: 0 %
Eosinophils Absolute: 0.1 10*3/uL (ref 0.0–0.5)
Eosinophils Relative: 2 %
HCT: 42 % (ref 39.0–52.0)
Hemoglobin: 14.4 g/dL (ref 13.0–17.0)
Immature Granulocytes: 1 %
Lymphocytes Relative: 10 %
Lymphs Abs: 0.7 10*3/uL (ref 0.7–4.0)
MCH: 29.9 pg (ref 26.0–34.0)
MCHC: 34.3 g/dL (ref 30.0–36.0)
MCV: 87.3 fL (ref 80.0–100.0)
Monocytes Absolute: 0.7 10*3/uL (ref 0.1–1.0)
Monocytes Relative: 9 %
Neutro Abs: 6 10*3/uL (ref 1.7–7.7)
Neutrophils Relative %: 78 %
Platelet Count: 207 10*3/uL (ref 150–400)
RBC: 4.81 MIL/uL (ref 4.22–5.81)
RDW: 13.8 % (ref 11.5–15.5)
WBC Count: 7.6 10*3/uL (ref 4.0–10.5)
nRBC: 0 % (ref 0.0–0.2)

## 2023-10-10 LAB — TROPONIN I (HIGH SENSITIVITY)
Troponin I (High Sensitivity): 5 ng/L (ref <18)
Troponin I (High Sensitivity): 5 ng/L (ref <18)

## 2023-10-10 LAB — CMP (CANCER CENTER ONLY)
ALT: 34 U/L (ref 0–44)
AST: 19 U/L (ref 15–41)
Albumin: 4.2 g/dL (ref 3.5–5.0)
Alkaline Phosphatase: 52 U/L (ref 38–126)
Anion gap: 7 (ref 5–15)
BUN: 15 mg/dL (ref 6–20)
CO2: 28 mmol/L (ref 22–32)
Calcium: 9 mg/dL (ref 8.9–10.3)
Chloride: 101 mmol/L (ref 98–111)
Creatinine: 0.93 mg/dL (ref 0.61–1.24)
GFR, Estimated: >60 mL/min (ref >60)
Glucose, Bld: 95 mg/dL (ref 70–99)
Potassium: 3.9 mmol/L (ref 3.5–5.1)
Sodium: 136 mmol/L (ref 135–145)
Total Bilirubin: 0.5 mg/dL (ref 0.0–1.2)
Total Protein: 6.9 g/dL (ref 6.5–8.1)

## 2023-10-10 LAB — BASIC METABOLIC PANEL
Anion gap: 8 (ref 5–15)
BUN: 14 mg/dL (ref 6–20)
CO2: 28 mmol/L (ref 22–32)
Calcium: 8.9 mg/dL (ref 8.9–10.3)
Chloride: 99 mmol/L (ref 98–111)
Creatinine, Ser: 0.98 mg/dL (ref 0.61–1.24)
GFR, Estimated: >60 mL/min (ref >60)
Glucose, Bld: 105 mg/dL — ABNORMAL HIGH (ref 70–99)
Potassium: 3.8 mmol/L (ref 3.5–5.1)
Sodium: 135 mmol/L (ref 135–145)

## 2023-10-10 LAB — CBC
HCT: 42.1 % (ref 39.0–52.0)
Hemoglobin: 14.6 g/dL (ref 13.0–17.0)
MCH: 29.9 pg (ref 26.0–34.0)
MCHC: 34.7 g/dL (ref 30.0–36.0)
MCV: 86.3 fL (ref 80.0–100.0)
Platelets: 224 10*3/uL (ref 150–400)
RBC: 4.88 MIL/uL (ref 4.22–5.81)
RDW: 13.9 % (ref 11.5–15.5)
WBC: 7.5 10*3/uL (ref 4.0–10.5)
nRBC: 0 % (ref 0.0–0.2)

## 2023-10-10 LAB — PROTIME-INR
INR: 1 (ref 0.8–1.2)
Prothrombin Time: 13.2 s (ref 11.4–15.2)

## 2023-10-10 LAB — MAGNESIUM: Magnesium: 1.8 mg/dL (ref 1.7–2.4)

## 2023-10-10 LAB — LACTATE DEHYDROGENASE: LDH: 143 U/L (ref 98–192)

## 2023-10-10 LAB — TSH: TSH: 1.821 u[IU]/mL (ref 0.350–4.500)

## 2023-10-10 MED ORDER — POTASSIUM CHLORIDE CRYS ER 20 MEQ PO TBCR
40.0000 meq | EXTENDED_RELEASE_TABLET | Freq: Once | ORAL | Status: AC
  Administered 2023-10-10: 40 meq via ORAL
  Filled 2023-10-10: qty 2

## 2023-10-10 MED ORDER — HEPARIN (PORCINE) 25000 UT/250ML-% IV SOLN
1300.0000 [IU]/h | INTRAVENOUS | Status: DC
  Administered 2023-10-10: 1000 [IU]/h via INTRAVENOUS
  Filled 2023-10-10: qty 250

## 2023-10-10 MED ORDER — IOHEXOL 350 MG/ML SOLN
75.0000 mL | Freq: Once | INTRAVENOUS | Status: AC | PRN
  Administered 2023-10-10: 75 mL via INTRAVENOUS

## 2023-10-10 MED ORDER — MAGNESIUM SULFATE 2 GM/50ML IV SOLN
2.0000 g | Freq: Once | INTRAVENOUS | Status: AC
Start: 2023-10-10 — End: 2023-10-10
  Administered 2023-10-10: 2 g via INTRAVENOUS
  Filled 2023-10-10: qty 50

## 2023-10-10 NOTE — ED Notes (Signed)
 Patient transported to CT

## 2023-10-10 NOTE — ED Provider Notes (Signed)
 [facility] Provider Note   CSN: [id number] Arrival date & time: 10/10/23  1052     History  Chief Complaint  Patient presents with   Chest Pain    Brandon Germond Selso Mannor. is a 58 y.o. male.  With history of malignant neoplasm of lower lobe of left lung on carboplatin and Alimta as well as low-dose Eliquis, COPD, chronic hypoxic respiratory failure, hypertension presenting to the ED for evaluation of chest pain.  States he has not had 3 episodes, once on Saturday, once on Tuesday and once yesterday.  He felt some chest discomfort.  This lasted for 2 to 3 minutes each time.  He states the discomfort radiating to the left arm and bilateral sides of the jaw.  He had some clamminess during that time.  No nausea or vomiting.  He is asymptomatic at this time.  He went to his oncologist and was encouraged to come to the emergency department for further evaluation.  He has a 6-lead EKG that he keeps on and was found to have a run of ventricular tachycardia during these episodes.  He was at rest during this episode.  When the episode was over he returned to his baseline.  He reports compliance with his Eliquis.   Chest Pain      Home Medications Prior to Admission medications   Medication Sig Start Date End Date Taking? Authorizing Provider  albuterol (PROVENTIL) (2.5 MG/3ML) 0.083% nebulizer solution Take 3 mLs (2.5 mg total) by nebulization every 6 (six) hours as needed for wheezing or shortness of breath. 11/19/22  Yes Cobb, Ruby Cola, NP  albuterol (VENTOLIN HFA) 108 (90 Base) MCG/ACT inhaler Inhale 2 puffs into the lungs every 6 (six) hours as needed for wheezing. 01/04/22  Yes [name], NP  apixaban (ELIQUIS) 2.5 MG TABS tablet Take 1 tablet (2.5 mg total) by mouth 2 (two) times daily. 05/12/23  Yes [name], [name], MD  atorvastatin (LIPITOR) 20 MG tablet TAKE ONE TABLET BY MOUTH DAILY 01/07/23  Yes [name], NP   Budeson-Glycopyrrol-Formoterol (BREZTRI AEROSPHERE) 160-9-4.8 MCG/ACT AERO Inhale 2 puffs into the lungs in the morning and at bedtime. 10/29/22  Yes [name], MD  COENZYME Q10 PO Take 100 mg by mouth in the morning.   Yes [provider]  gabapentin (NEURONTIN) 300 MG capsule Take 1 capsule (300 mg total) by mouth 2 (two) times daily. TAKE 1 CAPSULE BY MOUTH EVERY NIGHT AT BEDTIME FOR 3 NIGHTS, THEN INCREASE TO TAKE 1 CAPSULE BY MOUTH 2 TIMES A DAY FOR 1 WEEK, THEN OK TO INCREASE TO TAKE 1 CAPSULE BY MOUTH 3 TIMES A DAY 06/09/23 12/06/23 Yes Covington, [name], [name]  GUAIFENESIN 1200 PO Take 1,200 mg by mouth in the morning and at bedtime.   Yes [provider]  lidocaine-prilocaine (EMLA) cream Apply a dime size to port-a-cath 1-2 hours prior to access. Cover with saran wrap. 09/23/22  Yes [name], MD  Multiple Vitamin (MULTIVITAMIN) capsule Take 1 capsule by mouth at bedtime.   Yes [provider]  telmisartan (MICARDIS) 20 MG tablet Take 1 tablet (20 mg total) by mouth daily. Patient taking differently: Take 20 mg by mouth at bedtime. 09/12/23 12/11/23 Yes [name], [name], MD  folic acid (FOLVITE) 1 MG tablet TAKE 1 TABLET BY MOUTH DAILY. START 7 DAYS BEFORE PEMETREXED CHEMOTHERAPY, CONTINUE UNTIL 21 DAYS AFTER PEMETREXED COMPLETED 10/10/23   [name], MD  Allergies    Patient has no known allergies.    Review of Systems   Review of Systems  Cardiovascular:  Positive for chest pain.  All other systems reviewed and are negative.   Physical Exam Updated Vital Signs BP 120/64   Pulse 86   Temp 98.8 F (37.1 C)   Resp 20   Ht 5\' 5"  (1.651 m)   Wt 81.7 kg   SpO2 94%   BMI 29.96 kg/m  Physical Exam Vitals and nursing note reviewed.  Constitutional:      General: He is not in acute distress.    Appearance: Normal appearance. He is normal weight. He is not ill-appearing.     Comments: Resting comfortably in bed  HENT:     Head:  Normocephalic and atraumatic.  Pulmonary:     Effort: Pulmonary effort is normal. No respiratory distress.     Breath sounds: No decreased breath sounds, wheezing, rhonchi or rales.  Abdominal:     General: Abdomen is flat.  Musculoskeletal:        General: Normal range of motion.     Cervical back: Neck supple.  Skin:    General: Skin is warm and dry.  Neurological:     Mental Status: He is alert and oriented to person, place, and time.  Psychiatric:        Mood and Affect: Mood normal.        Behavior: Behavior normal.     ED Results / Procedures / Treatments   Labs (all labs ordered are listed, but only abnormal results are displayed) Labs Reviewed  BASIC METABOLIC PANEL - Abnormal; Notable for the following components:      Result Value   Glucose, Bld 105 (*)    All other components within normal limits  CBC  PROTIME-INR  MAGNESIUM  TROPONIN I (HIGH SENSITIVITY)  TROPONIN I (HIGH SENSITIVITY)    EKG EKG Interpretation Date/Time:  Friday October 10 2023 11:25:10 EDT Ventricular Rate:  72 PR Interval:  138 QRS Duration:  78 QT Interval:  382 QTC Calculation: 418 R Axis:   74  Text Interpretation: Sinus rhythm Since last tracing of earlier today artifact resolved, otherwise no significant change Confirmed by Elayne Snare ([id number]) on 10/10/2023 12:15:33 PM  Radiology CT Angio Chest (PE) W or WO Contrast Result Date: 10/10/2023 CLINICAL DATA:  Pulmonary embolism (PE) suspected, high prob * Tracking Code: BO * EXAM: CT ANGIOGRAPHY CHEST WITH CONTRAST TECHNIQUE: Multidetector CT imaging of the chest was performed using the standard protocol during bolus administration of intravenous contrast. Multiplanar CT image reconstructions and MIPs were obtained to evaluate the vascular anatomy. RADIATION DOSE REDUCTION: This exam was performed according to the departmental dose-optimization program which includes automated exposure control, adjustment of the mA and/or kV according  to patient size and/or use of iterative reconstruction technique. CONTRAST:  75mL OMNIPAQUE IOHEXOL 350 MG/ML SOLN COMPARISON:  CT angiography chest from 04/23/2023. FINDINGS: Cardiovascular: No evidence of embolism to the proximal subsegmental pulmonary artery level. Normal cardiac size. No pericardial effusion. No aortic aneurysm. There are coronary artery calcifications, in keeping with coronary artery disease. There are also mild peripheral atherosclerotic vascular calcifications of thoracic aorta and its major branches. Mediastinum/Nodes: Visualized thyroid gland appears grossly unremarkable. No solid / cystic mediastinal masses. The esophagus is nondistended precluding optimal assessment. No axillary, mediastinal or hilar lymphadenopathy by size criteria. Lungs/Pleura: The central tracheo-bronchial tree is patent. Moderate to severe upper lobe predominant centrilobular emphysema noted. There is focal scarring  and bandlike opacity in the left hilar region with extension into the left upper lobe, overall improved since the prior study, suggesting favorable treatment response. There is also interval decrease in the nodule in the left lung lower lobe currently measuring 1.0 x 1.5 cm, previously 1.6 x 2.0 cm. No new mass or consolidation. No pleural effusion or pneumothorax. No suspicious lung nodules. Upper Abdomen: Visualized upper abdominal viscera within normal limits. Musculoskeletal: A CT Port-a-Cath is seen in the right upper chest wall with the catheter terminating in the cavo-atrial junction region. Visualized soft tissues of the chest wall are otherwise grossly unremarkable. No suspicious osseous lesions. There are mild multilevel degenerative changes in the visualized spine. Old healed right first rib fracture noted. Review of the MIP images confirms the above findings. IMPRESSION: 1. No embolism to the proximal subsegmental pulmonary artery level. 2. Interval decrease in the size of left hilar/upper  lobe mass and left lower lobe nodule, compatible with favorable treatment response. No new lung mass or consolidation. No lymphadenopathy by size criteria. 3. Multiple other nonacute observations, as described above. Aortic Atherosclerosis (ICD10-I70.0) and Emphysema (ICD10-J43.9). NO Electronically Signed   By: Jules Schick M.D.   On: 10/10/2023 15:38   DG Chest Port 1 View Result Date: 10/10/2023 CLINICAL DATA:  Chest pain, lung cancer EXAM: PORTABLE CHEST 1 VIEW COMPARISON:  02/13/2023 FINDINGS: Right chest port remains in place. Normal heart size. Scarring or atelectasis in the left perihilar region. Elsewhere, no focal consolidation. No pleural effusion or pneumothorax. IMPRESSION: Scarring or atelectasis in the left perihilar region. No acute cardiopulmonary findings. Electronically Signed   By: Duanne Guess D.O.   On: 10/10/2023 14:19    Procedures Procedures    Medications Ordered in ED Medications  heparin ADULT infusion 100 units/mL (25000 units/259mL) (has no administration in time range)  iohexol (OMNIPAQUE) 350 MG/ML injection 75 mL (75 mLs Intravenous Contrast Given 10/10/23 1242)    ED Course/ Medical Decision Making/ A&P Clinical Course as of 10/10/23 1805  Fri Oct 10, 2023  1549 Spoke with cardiology Dr. Wyline Mood who recommends hospital admission and they will consult for unstable angina in the setting of known CAD [AS]  1756 Spoke with Dr. Alinda Money with hospitalist group who will admit. [AS]    Clinical Course User Index [AS] Lula Olszewski Edsel Petrin, [name]                                 Medical Decision Making Amount and/or Complexity of Data Reviewed Labs: ordered. Radiology: ordered.  Risk Prescription drug management. Decision regarding hospitalization.  This patient presents to the ED for concern of chest pain, this involves an extensive number of treatment options, and is a complaint that carries with it a high risk of complications and morbidity. The emergent  differential diagnosis of chest pain includes: Acute coronary syndrome, pericarditis, aortic dissection, pulmonary embolism, tension pneumothorax, and esophageal rupture.  My initial workup includes labs, imaging, EKG  Additional history obtained from: Nursing notes from this visit. Previous records within EMR system oncology note just prior to arrival Wife at bedside  I ordered, reviewed and interpreted labs which include: CBC, BMP, magnesium, INR, troponin, delta troponin.  No significant abnormalities.  I ordered imaging studies including chest x-ray, CT PE study I independently visualized and interpreted imaging which showed no acute findings on chest x-ray, decreasing size of mass in the left lower lobe of the lung, no PE  I agree with the radiologist interpretation  Cardiac Monitoring:  The patient was maintained on a cardiac monitor.  I personally viewed and interpreted the cardiac monitored which showed an underlying rhythm of: NSR  Consultations Obtained:  I requested consultation with cardiology Dr. Wyline Mood,  and discussed lab and imaging findings as well as pertinent plan - they recommend: Stated in ED course.  I consulted with hospitalist Dr. Alinda Money who will admit.  Afebrile, hemodynamically stable.  58 year old male presenting to the ED for evaluation of chest pain.  He has had 3 episodes of chest pain over the past 6 days lasting 2 to 3 minutes at a time.  During these episodes, he developed diaphoresis and pain radiating to the jaw and left arm.  He checked his personal cardiac monitor and it was found to have what appears to be a run of ventricular tachycardia.  This monitor only records 32nd loops so was unclear how long this episode lasted for.  His lab workup and imaging today is overall very reassuring, however this is a very concerning history.  I discussed the case with hospitalist with recommendations and the ED course.  Believe he would benefit from continuous cardiac  monitoring.  Discussed this plan with the patient who is in agreement.  Stable at time of admission.  Patient's case discussed with Dr. Theresia Lo who agrees with plan to admit.   Note: Portions of this report may have been transcribed using voice recognition software. Every effort was made to ensure accuracy; however, inadvertent computerized transcription errors may still be present.        Final Clinical Impression(s) / ED Diagnoses Final diagnoses:  Unstable angina [facility])  History of lung cancer    Rx / DC Orders ED Discharge Orders     None         Michelle Piper, [name] 10/10/23 1805    Theresia Lo [place], [place] 10/13/23 [phone]

## 2023-10-10 NOTE — ED Notes (Signed)
 Informed Pt. Of when the heparin will be started.  Pt. Also aware of admission.

## 2023-10-10 NOTE — Progress Notes (Addendum)
 PHARMACY - ANTICOAGULATION CONSULT NOTE  Pharmacy Consult for heparin  Indication: chest pain/ACS, on Eliquis PTA for hx of PE   No Known Allergies  Patient Measurements: Height: 5\' 5"  (165.1 cm) Weight: 81.7 kg (180 lb 0.7 oz) IBW/kg (Calculated) : 61.5 Heparin Dosing Weight: 78.3g   Vital Signs: Temp: 98.8 F (37.1 C) (03/14 1530) Temp Source: Oral (03/14 1456) BP: 115/80 (03/14 1530) Pulse Rate: 68 (03/14 1530)  Labs: Recent Labs    10/10/23 0925 10/10/23 1134 10/10/23 1347  HGB 14.4 14.6  --   HCT 42.0 42.1  --   PLT 207 224  --   LABPROT  --  13.2  --   INR  --  1.0  --   CREATININE 0.93 0.98  --   TROPONINIHS  --  5 5    Estimated Creatinine Clearance: 82.9 mL/min (by C-G formula based on SCr of 0.98 mg/dL).   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  Trop resulted at 5. Has been having runs of Vtach seen on EKG in ED. On Eliquis PTA for hx of PE. Last dose of Eliquis 3/14 AM around 07:00.    Goal of Therapy:  Heparin level 0.3-0.7 units/ml aPTT 66-102 Monitor platelets by anticoagulation protocol: Yes   Plan:  No bolus with recent DOAC intake.  Start heparin infusion at 1000 units/hr, will start at 19:00 12 hours from last dose of Eliquis.  Check anti-Xa level in 6 hours and daily while on heparin, check aPTT for next check given false elevation expect of Anti-xa.  Continue to monitor H&H and platelets  Estill Batten, PharmD, BCCCP 10/10/2023,3:49 PM

## 2023-10-10 NOTE — ED Triage Notes (Signed)
 Pt was upstairs for immunotherapy tx for lung cancer and was discussing with the doctor how he's been having intermittent CP radiating to his jaw for two minutes at a time. Pt states this happens without exertion. He also has diaphoresis when this happens.

## 2023-10-10 NOTE — Progress Notes (Signed)
 [facility] Follow Up Visit  Brandon Phillips [id number] 1965-09-16 58 y.o. 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 twice daily-start on 12/06/2022 -changed to 2.5 mg p.o. twice daily on 05/12/2023 Status post radiosurgery-completed on 02/11/2023 Durvalumab 1500 mg IV monthly-s/p cycle #3-- start on 02/17/2023     Interim History:  Brandon Phillips is back for follow-up.  Unfortunately, I think that we may have a problem.  For the past several days, he has had jaw pain.  He has had some clamminess.  He has had a little bit of nausea.  He has had a little bit of left arm pain.  He has a Kardia mobile and I looked at some of the tracings.  He said that at 1 point when this happened, his heart rate went up to 171.  I really think that this is an issue that we are going to have to get him to the ER and get him admitted.  It certainly sounds like unstable angina.  He has had chemo and radiation therapy.  I know the radiotherapy could certainly increase the risk of coronary artery disease.  He has had no headache.  He has had no mouth sores.  He has had no cough.  He has had no change in bowel or bladder habits.  I really hate that this is happening.  However, I really think this is going be something that we are going to have to get evaluated and I suspect she may need to have a cardiac cath.  Of note he is on low-dose Eliquis.  This is for a history of pulmonary embolism.  His last CT angiogram that was done back in September 2024 did not show any pulmonary embolism.  However, it was noted on the report that there was some coronary artery atherosclerosis.  Of note, his last TSH was 1.57 back in February.  Currently, I would have said that his  performance status is probably ECOG 1.   Wt Readings from Last 3 Encounters:  10/10/23 180 lb 0.6 oz (81.7 kg)  09/24/23 183 lb (83 kg)  09/10/23 180 lb (81.6 kg)     Medications:  Current Outpatient Medications:    albuterol (PROVENTIL) (2.5 MG/3ML) 0.083% nebulizer solution, Take 3 mLs (2.5 mg total) by nebulization every 6 (six) hours as needed for wheezing or shortness of breath., Disp: 75 mL, Rfl: 5   apixaban (ELIQUIS) 2.5 MG TABS tablet, Take 1 tablet (2.5 mg total) by mouth 2 (two) times daily., Disp: 60 tablet, Rfl: 12   atorvastatin (LIPITOR) 20 MG tablet, TAKE ONE TABLET BY MOUTH DAILY, Disp: 90 tablet, Rfl: 3   Budeson-Glycopyrrol-Formoterol (BREZTRI AEROSPHERE) 160-9-4.8 MCG/ACT AERO, Inhale 2 puffs into the lungs in the morning and at bedtime., Disp: 1 each, Rfl: 11   COENZYME Q10 PO, Take 100 mg by mouth in the morning., Disp: , Rfl:    gabapentin (NEURONTIN) 300 MG capsule, Take 1 capsule (300 mg total) by mouth 2 (two) times daily. TAKE 1 CAPSULE BY MOUTH EVERY NIGHT AT BEDTIME FOR 3 NIGHTS, THEN INCREASE TO TAKE 1 CAPSULE BY MOUTH 2 TIMES A DAY FOR 1 WEEK, THEN OK TO INCREASE TO TAKE 1 CAPSULE BY MOUTH 3 TIMES A DAY, Disp: 90 capsule, Rfl:  3   GUAIFENESIN 1200 PO, Take 1,200 mg by mouth in the morning and at bedtime., Disp: , Rfl:    Multiple Vitamin (MULTIVITAMIN) capsule, Take 1 capsule by mouth daily., Disp: , Rfl:    telmisartan (MICARDIS) 20 MG tablet, Take 1 tablet (20 mg total) by mouth daily., Disp: 90 tablet, Rfl: 1   albuterol (VENTOLIN HFA) 108 (90 Base) MCG/ACT inhaler, Inhale 2 puffs into the lungs every 6 (six) hours as needed for wheezing. (Patient not taking: Reported on 10/10/2023), Disp: 2 each, Rfl: 11   lidocaine-prilocaine (EMLA) cream, Apply a dime size to port-a-cath 1-2 hours prior to access. Cover with saran wrap. (Patient not taking: Reported on 10/10/2023), Disp: 30 g, Rfl: 2  Allergies: No Known Allergies  Past Medical History, Surgical history,  Social history, and Family History were reviewed and updated.  Review of Systems: Review of Systems  Constitutional: Negative.   HENT:  Negative.    Eyes: Negative.   Respiratory:  Positive for cough.   Cardiovascular: Negative.   Gastrointestinal: Negative.   Endocrine: Negative.   Genitourinary: Negative.    Musculoskeletal: Negative.   Skin: Negative.   Neurological: Negative.   Hematological: Negative.   Psychiatric/Behavioral: Negative.      Physical Exam: His vital signs are temperature 98.  Pulse 73.  Blood pressure 136/85.  Weight is 180 pounds.   Wt Readings from Last 3 Encounters:  10/10/23 180 lb 0.6 oz (81.7 kg)  09/24/23 183 lb (83 kg)  09/10/23 180 lb (81.6 kg)    Physical Exam Vitals reviewed.  HENT:     Head: Normocephalic and atraumatic.  Eyes:     Pupils: Pupils are equal, round, and reactive to light.  Cardiovascular:     Rate and Rhythm: Normal rate and regular rhythm.     Heart sounds: Normal heart sounds.  Pulmonary:     Effort: Pulmonary effort is normal.     Breath sounds: Normal breath sounds.  Abdominal:     General: Bowel sounds are normal.     Palpations: Abdomen is soft.  Musculoskeletal:        General: No tenderness or deformity. Normal range of motion.     Cervical back: Normal range of motion.  Lymphadenopathy:     Cervical: No cervical adenopathy.  Skin:    General: Skin is warm and dry.     Findings: No erythema or rash.  Neurological:     Mental Status: He is alert and oriented to person, place, and time.  Psychiatric:        Behavior: Behavior normal.        Thought Content: Thought content normal.        Judgment: Judgment normal.     Lab Results  Component Value Date   WBC 7.6 10/10/2023   HGB 14.4 10/10/2023   HCT 42.0 10/10/2023   MCV 87.3 10/10/2023   PLT 207 10/10/2023     Chemistry      Component Value Date/Time   NA 136 10/10/2023 0925   NA 138 04/16/2023 1109   K 3.9 10/10/2023 0925   CL 101  10/10/2023 0925   CO2 28 10/10/2023 0925   BUN 15 10/10/2023 0925   BUN 11 04/16/2023 1109   CREATININE 0.93 10/10/2023 0925   CREATININE 1.05 01/11/2022 0000      Component Value Date/Time   CALCIUM 9.0 10/10/2023 0925   ALKPHOS 52 10/10/2023 0925   AST 19 10/10/2023 0925   ALT 34 10/10/2023  1610   BILITOT 0.5 10/10/2023 9604       Impression and Plan: Brandon Phillips is a very nice 58 year old white male.  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  Again, he does not wish to have surgery.  He has seen Dr. Dorris Fetch of thoracic surgery.  For right now, I think that we really have to have a cardiac evaluation for this episodes of jaw pain.  Again it certainly sounds like unstable angina to me.  He is adopted so he does not have any family history.  We will get him down to the emergency room.  I will have to believe that he is probably can end up being admitted and getting a cardiac cath.  I would say that this [facility] did seem to help.  If he is admitted, I will certainly see him in the hospital.  [name], MD 3/14/202510:47 AM

## 2023-10-10 NOTE — ED Notes (Signed)
 [facility] called for transport.

## 2023-10-10 NOTE — Patient Instructions (Signed)

## 2023-10-11 ENCOUNTER — Inpatient Hospital Stay (HOSPITAL_COMMUNITY)

## 2023-10-11 ENCOUNTER — Encounter (HOSPITAL_COMMUNITY): Payer: Self-pay | Admitting: Internal Medicine

## 2023-10-11 DIAGNOSIS — I25111 Atherosclerotic heart disease of native coronary artery with angina pectoris with documented spasm: Secondary | ICD-10-CM | POA: Diagnosis present

## 2023-10-11 DIAGNOSIS — Z79899 Other long term (current) drug therapy: Secondary | ICD-10-CM | POA: Diagnosis not present

## 2023-10-11 DIAGNOSIS — Z9221 Personal history of antineoplastic chemotherapy: Secondary | ICD-10-CM | POA: Diagnosis not present

## 2023-10-11 DIAGNOSIS — Z923 Personal history of irradiation: Secondary | ICD-10-CM

## 2023-10-11 DIAGNOSIS — I4729 Other ventricular tachycardia: Secondary | ICD-10-CM | POA: Diagnosis not present

## 2023-10-11 DIAGNOSIS — C3432 Malignant neoplasm of lower lobe, left bronchus or lung: Secondary | ICD-10-CM | POA: Diagnosis not present

## 2023-10-11 DIAGNOSIS — Z86718 Personal history of other venous thrombosis and embolism: Secondary | ICD-10-CM

## 2023-10-11 DIAGNOSIS — Z7985 Long-term (current) use of injectable non-insulin antidiabetic drugs: Secondary | ICD-10-CM | POA: Diagnosis not present

## 2023-10-11 DIAGNOSIS — I200 Unstable angina: Secondary | ICD-10-CM

## 2023-10-11 DIAGNOSIS — E781 Pure hyperglyceridemia: Secondary | ICD-10-CM | POA: Diagnosis present

## 2023-10-11 DIAGNOSIS — R6884 Jaw pain: Secondary | ICD-10-CM | POA: Diagnosis present

## 2023-10-11 DIAGNOSIS — Z884 Allergy status to anesthetic agent status: Secondary | ICD-10-CM | POA: Diagnosis not present

## 2023-10-11 DIAGNOSIS — I700 Atherosclerosis of aorta: Secondary | ICD-10-CM | POA: Diagnosis present

## 2023-10-11 DIAGNOSIS — R079 Chest pain, unspecified: Secondary | ICD-10-CM | POA: Diagnosis not present

## 2023-10-11 DIAGNOSIS — Z79620 Long term (current) use of immunosuppressive biologic: Secondary | ICD-10-CM

## 2023-10-11 DIAGNOSIS — Z888 Allergy status to other drugs, medicaments and biological substances status: Secondary | ICD-10-CM | POA: Diagnosis not present

## 2023-10-11 DIAGNOSIS — I2510 Atherosclerotic heart disease of native coronary artery without angina pectoris: Secondary | ICD-10-CM | POA: Diagnosis not present

## 2023-10-11 DIAGNOSIS — J449 Chronic obstructive pulmonary disease, unspecified: Secondary | ICD-10-CM | POA: Diagnosis present

## 2023-10-11 DIAGNOSIS — I201 Angina pectoris with documented spasm: Secondary | ICD-10-CM | POA: Diagnosis not present

## 2023-10-11 DIAGNOSIS — Z87891 Personal history of nicotine dependence: Secondary | ICD-10-CM | POA: Diagnosis not present

## 2023-10-11 DIAGNOSIS — I471 Supraventricular tachycardia, unspecified: Secondary | ICD-10-CM | POA: Diagnosis present

## 2023-10-11 DIAGNOSIS — D6859 Other primary thrombophilia: Secondary | ICD-10-CM | POA: Diagnosis present

## 2023-10-11 DIAGNOSIS — C3490 Malignant neoplasm of unspecified part of unspecified bronchus or lung: Secondary | ICD-10-CM | POA: Diagnosis not present

## 2023-10-11 DIAGNOSIS — J9611 Chronic respiratory failure with hypoxia: Secondary | ICD-10-CM | POA: Diagnosis present

## 2023-10-11 DIAGNOSIS — I472 Ventricular tachycardia, unspecified: Secondary | ICD-10-CM | POA: Diagnosis present

## 2023-10-11 DIAGNOSIS — Z7901 Long term (current) use of anticoagulants: Secondary | ICD-10-CM | POA: Diagnosis not present

## 2023-10-11 DIAGNOSIS — E7800 Pure hypercholesterolemia, unspecified: Secondary | ICD-10-CM | POA: Diagnosis present

## 2023-10-11 DIAGNOSIS — I10 Essential (primary) hypertension: Secondary | ICD-10-CM | POA: Diagnosis present

## 2023-10-11 DIAGNOSIS — E871 Hypo-osmolality and hyponatremia: Secondary | ICD-10-CM | POA: Diagnosis not present

## 2023-10-11 DIAGNOSIS — Z86711 Personal history of pulmonary embolism: Secondary | ICD-10-CM | POA: Diagnosis not present

## 2023-10-11 DIAGNOSIS — C3492 Malignant neoplasm of unspecified part of left bronchus or lung: Secondary | ICD-10-CM | POA: Diagnosis present

## 2023-10-11 LAB — ECHOCARDIOGRAM COMPLETE
AR max vel: 2.24 cm2
AV Area VTI: 2.67 cm2
AV Area mean vel: 2.27 cm2
AV Mean grad: 11 mmHg
AV Peak grad: 21.3 mmHg
Ao pk vel: 2.31 m/s
Area-P 1/2: 2.5 cm2
Height: 65 in
S' Lateral: 2.2 cm
Weight: 2880.65 [oz_av]

## 2023-10-11 LAB — T4: T4, Total: 6.7 ug/dL (ref 4.5–12.0)

## 2023-10-11 LAB — HEPARIN LEVEL (UNFRACTIONATED): Heparin Unfractionated: 0.63 [IU]/mL (ref 0.30–0.70)

## 2023-10-11 LAB — CBC
HCT: 42.3 % (ref 39.0–52.0)
Hemoglobin: 14.8 g/dL (ref 13.0–17.0)
MCH: 30.3 pg (ref 26.0–34.0)
MCHC: 35 g/dL (ref 30.0–36.0)
MCV: 86.5 fL (ref 80.0–100.0)
Platelets: 230 10*3/uL (ref 150–400)
RBC: 4.89 MIL/uL (ref 4.22–5.81)
RDW: 13.9 % (ref 11.5–15.5)
WBC: 7.9 10*3/uL (ref 4.0–10.5)
nRBC: 0 % (ref 0.0–0.2)

## 2023-10-11 LAB — APTT
aPTT: 41 s — ABNORMAL HIGH (ref 24–36)
aPTT: 50 s — ABNORMAL HIGH (ref 24–36)
aPTT: 50 s — ABNORMAL HIGH (ref 24–36)
aPTT: 79 s — ABNORMAL HIGH (ref 24–36)

## 2023-10-11 MED ORDER — AMLODIPINE BESYLATE 2.5 MG PO TABS
2.5000 mg | ORAL_TABLET | Freq: Every day | ORAL | Status: DC
  Administered 2023-10-11 – 2023-10-13 (×3): 2.5 mg via ORAL
  Filled 2023-10-11 (×3): qty 1

## 2023-10-11 MED ORDER — ONDANSETRON HCL 4 MG/2ML IJ SOLN: 4.0000 mg | Freq: Four times a day (QID) | INTRAMUSCULAR | Status: DC | PRN

## 2023-10-11 MED ORDER — UMECLIDINIUM BROMIDE 62.5 MCG/ACT IN AEPB
1.0000 | INHALATION_SPRAY | Freq: Every day | RESPIRATORY_TRACT | Status: DC
  Administered 2023-10-11 – 2023-10-14 (×4): 1 via RESPIRATORY_TRACT
  Filled 2023-10-11 (×2): qty 7

## 2023-10-11 MED ORDER — BUDESON-GLYCOPYRROL-FORMOTEROL 160-9-4.8 MCG/ACT IN AERO: 2.0000 | INHALATION_SPRAY | Freq: Two times a day (BID) | RESPIRATORY_TRACT | Status: DC

## 2023-10-11 MED ORDER — FOLIC ACID 1 MG PO TABS
1.0000 mg | ORAL_TABLET | Freq: Every day | ORAL | Status: DC
  Administered 2023-10-11 – 2023-10-14 (×4): 1 mg via ORAL
  Filled 2023-10-11 (×4): qty 1

## 2023-10-11 MED ORDER — ISOSORBIDE MONONITRATE ER 30 MG PO TB24
30.0000 mg | ORAL_TABLET | Freq: Every day | ORAL | Status: DC
  Administered 2023-10-11 – 2023-10-12 (×2): 30 mg via ORAL
  Filled 2023-10-11 (×2): qty 1

## 2023-10-11 MED ORDER — ENOXAPARIN SODIUM 80 MG/0.8ML IJ SOSY: 80.0000 mg | PREFILLED_SYRINGE | Freq: Two times a day (BID) | INTRAMUSCULAR | Status: DC

## 2023-10-11 MED ORDER — NITROGLYCERIN 0.4 MG SL SUBL: 0.4000 mg | SUBLINGUAL_TABLET | SUBLINGUAL | Status: DC | PRN

## 2023-10-11 MED ORDER — HEPARIN (PORCINE) 25000 UT/250ML-% IV SOLN
1400.0000 [IU]/h | INTRAVENOUS | Status: DC
  Administered 2023-10-11: 1500 [IU]/h via INTRAVENOUS
  Administered 2023-10-12 – 2023-10-13 (×2): 1400 [IU]/h via INTRAVENOUS
  Filled 2023-10-11 (×3): qty 250

## 2023-10-11 MED ORDER — METOPROLOL TARTRATE 12.5 MG HALF TABLET: 12.5000 mg | ORAL_TABLET | Freq: Two times a day (BID) | ORAL | Status: DC

## 2023-10-11 MED ORDER — FLUTICASONE FUROATE-VILANTEROL 200-25 MCG/ACT IN AEPB
1.0000 | INHALATION_SPRAY | Freq: Every day | RESPIRATORY_TRACT | Status: DC
  Administered 2023-10-11 – 2023-10-14 (×4): 1 via RESPIRATORY_TRACT
  Filled 2023-10-11: qty 28

## 2023-10-11 MED ORDER — ATORVASTATIN CALCIUM 40 MG PO TABS
40.0000 mg | ORAL_TABLET | Freq: Every day | ORAL | Status: DC
  Administered 2023-10-11 – 2023-10-14 (×4): 40 mg via ORAL
  Filled 2023-10-11 (×4): qty 1

## 2023-10-11 MED ORDER — ASPIRIN 81 MG PO TBEC: 81.0000 mg | DELAYED_RELEASE_TABLET | Freq: Every day | ORAL | Status: DC

## 2023-10-11 MED ORDER — ACETAMINOPHEN 325 MG PO TABS
650.0000 mg | ORAL_TABLET | ORAL | Status: DC | PRN
  Administered 2023-10-12 – 2023-10-14 (×5): 650 mg via ORAL
  Filled 2023-10-11 (×5): qty 2

## 2023-10-11 MED ORDER — CHLORHEXIDINE GLUCONATE CLOTH 2 % EX PADS
6.0000 | MEDICATED_PAD | Freq: Every day | CUTANEOUS | Status: DC
  Administered 2023-10-11 – 2023-10-14 (×5): 6 via TOPICAL

## 2023-10-11 NOTE — Consult Note (Signed)
 Cardiology Consultation   Patient ID: Hayato Guaman. MRN: [id number]; DOB: December 29, 1965  Admit date: 10/10/2023 Date of Consult: 10/11/2023  PCP:  [name], NP   [facility] Providers Cardiologist:  None        Patient Profile:   Brandon Phillips. is a 58 y.o. male with a history of non-small cell lung cancer now receiving immunotherapy, prior pulmonary embolism now on chronic oral anticoagulation, hypercholesteremia, chronic obstructive pulmonary disease, and prior cigarette use who is being seen 10/11/2023 for the evaluation of chest pain at the request of Dr. Maisie Fus.   History of Present Illness:   Mr. Depaolo reports this past Saturday he experienced sudden jaw tingling, flushing in face, and clammy feeling without chest pain.  His symptoms was associated with dizziness but he denies fainting, and only lasted several minutes.  He denies feeling like his heart was racing or fluttering at the time.  He decided to purchase a Cardiomobile device to monitor his heart rhythm.  He reports similar symptoms occurred this past Tuesday for which his Cardiomobile device reported atrial fibrillation.  Symptoms occurred again this past Thursday, with last episode patient reporting left arm pain.  Patient informed nursing staff at [facility] while he was getting his immunotherapy for his lung cancer and was told to report to their emergency department for further evaluation.    Patient presented to [facility] Emergency Department for which initial ECG was normal. HS-troponin levels were all normal.  Chest CT angiography performed didn't demonstrate pulmonary embolism.  Patient was transferred here for further management.    Of note, his Cardiomobile device was reviewed.  It appears at around 7 am patient had a possible non-sustained monomorphic event  by ECG read -- patient reports having symptoms at that time.  However, the Cardiomobile documented atrial  fibrillation but review of the strips didn't demonstrate this finding.  The Cardiomobile device also documented atrial fibrillation on Thursday at the time of his other jaw tingling episode.  Upon further review of the strips documented from his Cardiomobile device, patient was in sinus rhythm with artifact.  Past Medical History:  Diagnosis Date   Cancer (HCC)    lung cancer   CAP (community acquired pneumonia)    COPD (chronic obstructive pulmonary disease) (HCC)    Eczema    HLD (hyperlipidemia)    Primary cancer of left lower lobe of lung (HCC) 09/17/2022   Tobacco use disorder 01/24/2017    Past Surgical History:  Procedure Laterality Date   BRONCHIAL BIOPSY  08/29/2022   Procedure: BRONCHIAL BIOPSIES;  Surgeon: [name], MD;  Location: [facility] ENDOSCOPY;  Service: Pulmonary;;   BRONCHIAL BRUSHINGS  08/29/2022   Procedure: BRONCHIAL BRUSHINGS;  Surgeon: [name], MD;  Location: [facility] ENDOSCOPY;  Service: Pulmonary;;   BRONCHIAL NEEDLE ASPIRATION BIOPSY  08/29/2022   Procedure: BRONCHIAL NEEDLE ASPIRATION BIOPSIES;  Surgeon: [name], MD;  Location: [facility] ENDOSCOPY;  Service: Pulmonary;;   BRONCHIAL WASHINGS  08/29/2022   Procedure: BRONCHIAL WASHINGS;  Surgeon: [name], MD;  Location: [facility] ENDOSCOPY;  Service: Pulmonary;;   FIDUCIAL MARKER PLACEMENT  08/29/2022   Procedure: FIDUCIAL MARKER PLACEMENT;  Surgeon: [name], MD;  Location: [facility] ENDOSCOPY;  Service: Pulmonary;;   HERNIA REPAIR     as an infant   IR IMAGING GUIDED PORT INSERTION  10/03/2022   IR RADIOLOGIST EVAL & MGMT  10/21/2022   VIDEO BRONCHOSCOPY WITH ENDOBRONCHIAL ULTRASOUND  08/29/2022  Procedure: VIDEO BRONCHOSCOPY WITH ENDOBRONCHIAL ULTRASOUND;  Surgeon: [name], MD;  Location: [facility] ENDOSCOPY;  Service: Pulmonary;;     Home Medications:  Prior to Admission medications   Medication Sig Start Date End Date Taking? Authorizing Provider  albuterol (PROVENTIL) (2.5 MG/3ML) 0.083%  nebulizer solution Take 3 mLs (2.5 mg total) by nebulization every 6 (six) hours as needed for wheezing or shortness of breath. 11/19/22  Yes Cobb, Ruby Cola, NP  albuterol (VENTOLIN HFA) 108 (90 Base) MCG/ACT inhaler Inhale 2 puffs into the lungs every 6 (six) hours as needed for wheezing. 01/04/22  Yes [name], NP  apixaban (ELIQUIS) 2.5 MG TABS tablet Take 1 tablet (2.5 mg total) by mouth 2 (two) times daily. 05/12/23  Yes [name], [name], MD  atorvastatin (LIPITOR) 20 MG tablet TAKE ONE TABLET BY MOUTH DAILY 01/07/23  Yes [name], NP  Budeson-Glycopyrrol-Formoterol (BREZTRI AEROSPHERE) 160-9-4.8 MCG/ACT AERO Inhale 2 puffs into the lungs in the morning and at bedtime. 10/29/22  Yes [name], MD  COENZYME Q10 PO Take 100 mg by mouth in the morning.   Yes [provider]  gabapentin (NEURONTIN) 300 MG capsule Take 1 capsule (300 mg total) by mouth 2 (two) times daily. TAKE 1 CAPSULE BY MOUTH EVERY NIGHT AT BEDTIME FOR 3 NIGHTS, THEN INCREASE TO TAKE 1 CAPSULE BY MOUTH 2 TIMES A DAY FOR 1 WEEK, THEN OK TO INCREASE TO TAKE 1 CAPSULE BY MOUTH 3 TIMES A DAY 06/09/23 12/06/23 Yes Covington, [name], [name]  GUAIFENESIN 1200 PO Take 1,200 mg by mouth in the morning and at bedtime.   Yes [provider]  lidocaine-prilocaine (EMLA) cream Apply a dime size to port-a-cath 1-2 hours prior to access. Cover with saran wrap. 09/23/22  Yes [name], MD  Multiple Vitamin (MULTIVITAMIN) capsule Take 1 capsule by mouth at bedtime.   Yes [provider]  telmisartan (MICARDIS) 20 MG tablet Take 1 tablet (20 mg total) by mouth daily. Patient taking differently: Take 20 mg by mouth at bedtime. 09/12/23 12/11/23 Yes [name], [name], MD  folic acid (FOLVITE) 1 MG tablet TAKE 1 TABLET BY MOUTH DAILY. START 7 DAYS BEFORE PEMETREXED CHEMOTHERAPY, CONTINUE UNTIL 21 DAYS AFTER PEMETREXED COMPLETED 10/10/23   [name], MD    Inpatient Medications: Scheduled Meds:  [START ON  10/12/2023] aspirin EC  81 mg Oral Daily   atorvastatin  40 mg Oral Daily   Chlorhexidine Gluconate Cloth  6 each Topical Daily   fluticasone furoate-vilanterol  1 puff Inhalation Daily   And   umeclidinium bromide  1 puff Inhalation Daily   folic acid  1 mg Oral Daily   metoprolol tartrate  12.5 mg Oral BID   Continuous Infusions:  heparin 1,300 Units/hr (10/11/23 0324)   PRN Meds: acetaminophen, nitroGLYCERIN, ondansetron (ZOFRAN) IV  Allergies:   No Known Allergies  Social History:   Social History   Socioeconomic History   Marital status: Married    Spouse name: [name]   Number of children: Not on file   Years of education: Not on file   Highest education level: Associate degree: occupational, Scientist, product/process development, or vocational program  Occupational History   Not on file  Tobacco Use   Smoking status: Former    Current packs/day: 0.00    Average packs/day: 1.5 packs/day for 35.0 years (52.5 ttl pk-yrs)    Types: Cigarettes    Start date: 03/30/1982    Quit date: 03/30/2017    Years since quitting:  6.5   Smokeless tobacco: Never  Vaping Use   Vaping status: Former   Quit date: 08/23/2022   Substances: Nicotine  Substance and Sexual Activity   Alcohol use: No   Drug use: No   Sexual activity: Yes  Other Topics Concern   Not on file  Social History Narrative   5 golden retrievers   Social Drivers of Corporate investment banker Strain: Low Risk  (07/08/2023)   Overall Financial Resource Strain (CARDIA)    Difficulty of Paying Living Expenses: Not very hard  Food Insecurity: No Food Insecurity (10/11/2023)   Hunger Vital Sign    Worried About Running Out of Food in the Last Year: Never true    Ran Out of Food in the Last Year: Never true  Transportation Needs: No Transportation Needs (10/11/2023)   PRAPARE - Administrator, Civil Service (Medical): No    Lack of Transportation (Non-Medical): No  Physical Activity: Insufficiently Active (07/08/2023)   Exercise  Vital Sign    Days of Exercise per Week: 4 days    Minutes of Exercise per Session: 20 min  Stress: No Stress Concern Present (07/08/2023)   [employer] of Occupational Health - Occupational Stress Questionnaire    Feeling of Stress : Only a little  Social Connections: Moderately Integrated (10/11/2023)   Social Connection and Isolation Panel [NHANES]    Frequency of Communication with Friends and Family: More than three times a week    Frequency of Social Gatherings with Friends and Family: More than three times a week    Attends Religious Services: 1 to 4 times per year    Active Member of [employer] or Organizations: No    Attends Banker Meetings: Never    Marital Status: Married  Catering manager Violence: Not At Risk (10/11/2023)   Humiliation, Afraid, Rape, and Kick questionnaire    Fear of Current or Ex-Partner: No    Emotionally Abused: No    Physically Abused: No    Sexually Abused: No    Family History:   History reviewed. No pertinent family history.   ROS:  Please see the history of present illness.  All other ROS reviewed and negative.     Physical Exam/Data:   Vitals:   10/10/23 2100 10/10/23 2145 10/10/23 2200 10/11/23 0126  BP: 124/78 113/78 112/75 (!) 155/91  Pulse: 84 77 82 85  Resp: 16 18 17    Temp:    98.4 F (36.9 C)  TempSrc:    Oral  SpO2: 93% 94% 92% 95%  Weight:      Height:        Intake/Output Summary (Last 24 hours) at 10/11/2023 0503 Last data filed at 10/11/2023 0324 Gross per 24 hour  Intake 81.11 ml  Output --  Net 81.11 ml      10/10/2023   11:58 AM 10/10/2023    9:35 AM 09/24/2023    4:18 PM  Last 3 Weights  Weight (lbs) 180 lb 0.7 oz 180 lb 0.6 oz 183 lb  Weight (kg) 81.666 kg 81.666 kg 83.008 kg     Body mass index is 29.96 kg/m.  General:  Well nourished, well developed, in no acute distress HEENT: normal Neck: no JVD Vascular: No carotid bruits; Distal pulses 2+ bilaterally Cardiac:  normal S1, S2; RRR; no  murmur  Lungs:  clear to auscultation bilaterally, no wheezing, rhonchi or rales  Abd: soft, nontender, no hepatomegaly  Ext: no edema Musculoskeletal:  No deformities,  BUE and BLE strength normal and equal Skin: warm and dry  Neuro:  CNs 2-12 intact, no focal abnormalities noted Psych:  Normal affect   EKG:  The EKG was personally reviewed and demonstrates:  10/10/23 (11:25 hrs):  Sinus rhythm; normal ECG Telemetry:  Telemetry was personally reviewed and demonstrates:  Sinus rhythm  Relevant CV Studies: # Echocardiogram 09/10/22: IMPRESSIONS   1. Mild intracavitary gradient. Peak velocity 0.96 m/s. Peak gradient 3.7  mmHg. Left ventricular ejection fraction, by estimation, is 65 to 70%. The  left ventricle has normal function. The left ventricle has no regional  wall motion abnormalities. Left  ventricular diastolic parameters were normal. The average left ventricular  global longitudinal strain is -19.4 %. The global longitudinal strain is  normal.   2. Right ventricular systolic function is normal. The right ventricular  size is normal. There is normal pulmonary artery systolic pressure.   3. The mitral valve is normal in structure. No evidence of mitral valve  regurgitation. No evidence of mitral stenosis.   4. The aortic valve is tricuspid. Aortic valve regurgitation is not  visualized. No aortic stenosis is present.   5. The inferior vena cava is normal in size with greater than 50%  respiratory variability, suggesting right atrial pressure of 3 mmHg.   Laboratory Data:  High Sensitivity Troponin:   Recent Labs  Lab 10/10/23 1134 10/10/23 1347  TROPONINIHS 5 5     Chemistry Recent Labs  Lab 10/10/23 0925 10/10/23 1134  NA 136 135  K 3.9 3.8  CL 101 99  CO2 28 28  GLUCOSE 95 105*  BUN 15 14  CREATININE 0.93 0.98  CALCIUM 9.0 8.9  MG  --  1.8  GFRNONAA >60 >60  ANIONGAP 7 8    Recent Labs  Lab 10/10/23 0925  PROT 6.9  ALBUMIN 4.2  AST 19  ALT 34   ALKPHOS 52  BILITOT 0.5   Lipids No results for input(s): "CHOL", "TRIG", "HDL", "LABVLDL", "LDLCALC", "CHOLHDL" in the last 168 hours.  Hematology Recent Labs  Lab 10/10/23 0925 10/10/23 1134 10/11/23 0100  WBC 7.6 7.5 7.9  RBC 4.81 4.88 4.89  HGB 14.4 14.6 14.8  HCT 42.0 42.1 42.3  MCV 87.3 86.3 86.5  MCH 29.9 29.9 30.3  MCHC 34.3 34.7 35.0  RDW 13.8 13.9 13.9  PLT 207 224 230   Thyroid  Recent Labs  Lab 10/10/23 0925  TSH 1.821    BNPNo results for input(s): "BNP", "PROBNP" in the last 168 hours.  DDimer No results for input(s): "DDIMER" in the last 168 hours.   Radiology/Studies:  CT Angio Chest (PE) W or WO Contrast Result Date: 10/10/2023 CLINICAL DATA:  Pulmonary embolism (PE) suspected, high prob * Tracking Code: BO * EXAM: CT ANGIOGRAPHY CHEST WITH CONTRAST TECHNIQUE: Multidetector CT imaging of the chest was performed using the standard protocol during bolus administration of intravenous contrast. Multiplanar CT image reconstructions and MIPs were obtained to evaluate the vascular anatomy. RADIATION DOSE REDUCTION: This exam was performed according to the departmental dose-optimization program which includes automated exposure control, adjustment of the mA and/or kV according to patient size and/or use of iterative reconstruction technique. CONTRAST:  75mL OMNIPAQUE IOHEXOL 350 MG/ML SOLN COMPARISON:  CT angiography chest from 04/23/2023. FINDINGS: Cardiovascular: No evidence of embolism to the proximal subsegmental pulmonary artery level. Normal cardiac size. No pericardial effusion. No aortic aneurysm. There are coronary artery calcifications, in keeping with coronary artery disease. There are also mild peripheral atherosclerotic vascular  calcifications of thoracic aorta and its major branches. Mediastinum/Nodes: Visualized thyroid gland appears grossly unremarkable. No solid / cystic mediastinal masses. The esophagus is nondistended precluding optimal assessment. No  axillary, mediastinal or hilar lymphadenopathy by size criteria. Lungs/Pleura: The central tracheo-bronchial tree is patent. Moderate to severe upper lobe predominant centrilobular emphysema noted. There is focal scarring and bandlike opacity in the left hilar region with extension into the left upper lobe, overall improved since the prior study, suggesting favorable treatment response. There is also interval decrease in the nodule in the left lung lower lobe currently measuring 1.0 x 1.5 cm, previously 1.6 x 2.0 cm. No new mass or consolidation. No pleural effusion or pneumothorax. No suspicious lung nodules. Upper Abdomen: Visualized upper abdominal viscera within normal limits. Musculoskeletal: A CT Port-a-Cath is seen in the right upper chest wall with the catheter terminating in the cavo-atrial junction region. Visualized soft tissues of the chest wall are otherwise grossly unremarkable. No suspicious osseous lesions. There are mild multilevel degenerative changes in the visualized spine. Old healed right first rib fracture noted. Review of the MIP images confirms the above findings. IMPRESSION: 1. No embolism to the proximal subsegmental pulmonary artery level. 2. Interval decrease in the size of left hilar/upper lobe mass and left lower lobe nodule, compatible with favorable treatment response. No new lung mass or consolidation. No lymphadenopathy by size criteria. 3. Multiple other nonacute observations, as described above. Aortic Atherosclerosis (ICD10-I70.0) and Emphysema (ICD10-J43.9). NO Electronically Signed   By: Jules Schick M.D.   On: 10/10/2023 15:38   DG Chest Port 1 View Result Date: 10/10/2023 CLINICAL DATA:  Chest pain, lung cancer EXAM: PORTABLE CHEST 1 VIEW COMPARISON:  02/13/2023 FINDINGS: Right chest port remains in place. Normal heart size. Scarring or atelectasis in the left perihilar region. Elsewhere, no focal consolidation. No pleural effusion or pneumothorax. IMPRESSION: Scarring  or atelectasis in the left perihilar region. No acute cardiopulmonary findings. Electronically Signed   By: Duanne Guess D.O.   On: 10/10/2023 14:19     Assessment and Plan:   Jaw tingling: Patient with multiple episodes of jaw tingling with other associated events of unclear etiology, with one event occurring with non-sustained ventricular tachycardia per Cardiomobile device reading.  Multiple ECGs and telemetry done since admission has been normal.  Low suspicion for unstable angina for etiology of symptoms.  HS-troponin levels are normal.  Low-to-intermediate pretest probability for significant coronary artery disease given prior history of smoking and male sex.   --Monitor for now and can discontinue heparin drip. --Reasonable to arrange for exercise treadmill stress test, potentially outpatient given weekend, to evaluate for coronary artery disease and tachyarrhythmias with exercise. --Arrange for 14-day arrhythmia monitor at the time of discharge.    Ventricular tachycardia: One documented non-sustained supraventricular tachycardia by his Cardiomobile device.  Telemetry since admission normal. --Reasonable to arrange for 14-day arrhythmia monitor as above.    Risk Assessment/Risk Scores:                For questions or updates, please contact [facility] Please consult [email or web address] for contact info under    Signed, Judie Grieve, MD  10/11/2023 5:03 AM

## 2023-10-11 NOTE — Progress Notes (Signed)
 [facility] came in [facility]. [facility] admits paged. Pt VS stable. No complaints of pain, dizziness, nausea

## 2023-10-11 NOTE — Progress Notes (Signed)
 Progress Note  Patient Name: Brandon Phillips. Date of Encounter: 10/11/2023  Primary Cardiologist: None     Patient Profile     58 y.o. male admitted and consulted for jaw pain assoc with flushing an clammy feeling; variably associated with palpitations History of non-small cell lung cancer receiving immunotherapy and a prior pulmonary embolism on chronic anticoagulation prompted evaluation at [facility] which was negative for PE negative troponins.  But a Kardia mobile device which suggested atrial fibrillation but was not felt to be this by admitting cardiology Subjective   Recurrent jaw discomfort this morning and there is ST segment elevation transiently occurring on the monitor  Inpatient Medications    Scheduled Meds:  [START ON 10/12/2023] aspirin EC  81 mg Oral Daily   atorvastatin  40 mg Oral Daily   Chlorhexidine Gluconate Cloth  6 each Topical Daily   fluticasone furoate-vilanterol  1 puff Inhalation Daily   And   umeclidinium bromide  1 puff Inhalation Daily   folic acid  1 mg Oral Daily   metoprolol tartrate  12.5 mg Oral BID   Continuous Infusions:  heparin 1,300 Units/hr (10/11/23 0324)   PRN Meds: acetaminophen, nitroGLYCERIN, ondansetron (ZOFRAN) IV   Vital Signs    Vitals:   10/10/23 2145 10/10/23 2200 10/11/23 0126 10/11/23 0544  BP: 113/78 112/75 (!) 155/91 128/86  Pulse: 77 82 85 73  Resp: 18 17  16   Temp:   98.4 F (36.9 C) 97.8 F (36.6 C)  TempSrc:   Oral Oral  SpO2: 94% 92% 95% 96%  Weight:      Height:        Intake/Output Summary (Last 24 hours) at 10/11/2023 1610 Last data filed at 10/11/2023 0324 Gross per 24 hour  Intake 81.11 ml  Output --  Net 81.11 ml   Filed Weights   10/10/23 1158  Weight: 81.7 kg    Telemetry    Transient ST segment elevation associated with his jaw discomfort 8 AM- Personally Reviewed  ECG     - Personally Reviewed  Physical Exam    GEN: No acute distress.   Neck: No  JVD Cardiac: RRR, no murmurs, rubs, or gallops.  Respiratory: Clear to auscultation bilaterally. GI: Soft, nontender, non-distended  MS: No edema; No deformity. Neuro:  Nonfocal  Psych: Normal affect   Labs    Chemistry Recent Labs  Lab 10/10/23 0925 10/10/23 1134  NA 136 135  K 3.9 3.8  CL 101 99  CO2 28 28  GLUCOSE 95 105*  BUN 15 14  CREATININE 0.93 0.98  CALCIUM 9.0 8.9  PROT 6.9  --   ALBUMIN 4.2  --   AST 19  --   ALT 34  --   ALKPHOS 52  --   BILITOT 0.5  --   GFRNONAA >60 >60  ANIONGAP 7 8     Hematology Recent Labs  Lab 10/10/23 0925 10/10/23 1134 10/11/23 0100  WBC 7.6 7.5 7.9  RBC 4.81 4.88 4.89  HGB 14.4 14.6 14.8  HCT 42.0 42.1 42.3  MCV 87.3 86.3 86.5  MCH 29.9 29.9 30.3  MCHC 34.3 34.7 35.0  RDW 13.8 13.9 13.9  PLT 207 224 230      Radiology    CT Angio Chest (PE) W or WO Contrast Result Date: 10/10/2023 CLINICAL DATA:  Pulmonary embolism (PE) suspected, high prob * Tracking Code: BO * EXAM: CT ANGIOGRAPHY CHEST WITH CONTRAST TECHNIQUE: Multidetector CT imaging of the  chest was performed using the standard protocol during bolus administration of intravenous contrast. Multiplanar CT image reconstructions and MIPs were obtained to evaluate the vascular anatomy. RADIATION DOSE REDUCTION: This exam was performed according to the departmental dose-optimization program which includes automated exposure control, adjustment of the mA and/or kV according to patient size and/or use of iterative reconstruction technique. CONTRAST:  75mL OMNIPAQUE IOHEXOL 350 MG/ML SOLN COMPARISON:  CT angiography chest from 04/23/2023. FINDINGS: Cardiovascular: No evidence of embolism to the proximal subsegmental pulmonary artery level. Normal cardiac size. No pericardial effusion. No aortic aneurysm. There are coronary artery calcifications, in keeping with coronary artery disease. There are also mild peripheral atherosclerotic vascular calcifications of thoracic aorta and  its major branches. Mediastinum/Nodes: Visualized thyroid gland appears grossly unremarkable. No solid / cystic mediastinal masses. The esophagus is nondistended precluding optimal assessment. No axillary, mediastinal or hilar lymphadenopathy by size criteria. Lungs/Pleura: The central tracheo-bronchial tree is patent. Moderate to severe upper lobe predominant centrilobular emphysema noted. There is focal scarring and bandlike opacity in the left hilar region with extension into the left upper lobe, overall improved since the prior study, suggesting favorable treatment response. There is also interval decrease in the nodule in the left lung lower lobe currently measuring 1.0 x 1.5 cm, previously 1.6 x 2.0 cm. No new mass or consolidation. No pleural effusion or pneumothorax. No suspicious lung nodules. Upper Abdomen: Visualized upper abdominal viscera within normal limits. Musculoskeletal: A CT Port-a-Cath is seen in the right upper chest wall with the catheter terminating in the cavo-atrial junction region. Visualized soft tissues of the chest wall are otherwise grossly unremarkable. No suspicious osseous lesions. There are mild multilevel degenerative changes in the visualized spine. Old healed right first rib fracture noted. Review of the MIP images confirms the above findings. IMPRESSION: 1. No embolism to the proximal subsegmental pulmonary artery level. 2. Interval decrease in the size of left hilar/upper lobe mass and left lower lobe nodule, compatible with favorable treatment response. No new lung mass or consolidation. No lymphadenopathy by size criteria. 3. Multiple other nonacute observations, as described above. Aortic Atherosclerosis (ICD10-I70.0) and Emphysema (ICD10-J43.9). NO Electronically Signed   By: Jules Schick M.D.   On: 10/10/2023 15:38   DG Chest Port 1 View Result Date: 10/10/2023 CLINICAL DATA:  Chest pain, lung cancer EXAM: PORTABLE CHEST 1 VIEW COMPARISON:  02/13/2023 FINDINGS: Right  chest port remains in place. Normal heart size. Scarring or atelectasis in the left perihilar region. Elsewhere, no focal consolidation. No pleural effusion or pneumothorax. IMPRESSION: Scarring or atelectasis in the left perihilar region. No acute cardiopulmonary findings. Electronically Signed   By: Duanne Guess D.O.   On: 10/10/2023 14:19    Cardiac Studies   2/24 echocardiogram EF 70% Assessment & Plan    Cardiac spasm with ST segment elevation  Question tachycardia on his Kardia mobile device  Non-small cell lung cancer on immunotherapy  Pulmonary embolism on chronic anticoagulation  Prior cigarette use   With spasm, we will add nitrates, calcium blocker, discontinue his metoprolol and ASA,  continue statin.  Will review with colleagues as to the role of catheterization as frequently spasm occurs in the setting of a atherosclerotic plaque>> anticipate cath       For questions or updates, please contact [facility] Please consult [email or web address] for contact info under Cardiology/STEMI.      Signed, Sherryl Manges, MD  10/11/2023, 8:33 AM

## 2023-10-11 NOTE — Progress Notes (Signed)
 PROGRESS NOTE    Brandon Phillips.  XLK:[id number] DOB: August 30, 1965 DOA: 10/10/2023 PCP: [name], NP   Brief Narrative:  Brandon Phillips. is a 58 y.o. male with medical history significant of  lung cancer on immuotherapy, HLD, COPD, PE on low dose Eliquis,hx of tobacco abuse who presented with complaint of chest pain which was associated with radiation to the jaw and nausea and diaphoresis.  patient has cardio-mobile EKG  which noted ventricular tachycardia during episodes. He went to his oncologist and was encouraged to come to the emergency department for further evaluation.  Assessment & Plan:   Principal Problem:   Unstable angina (HCC) Active Problems:   Ventricular tachycardia (HCC)  Chest pain: Considered to be unstable angina.  Seen by cardiology.  Troponins negative-ACS ruled out.  Patient appears to have some ST elevation which was transient on the telemetry this morning.  Cardiology has made some changes, added nitroglycerin and calcium channel blockers.  They plan to do cardiac cath on Monday.  Patient is symptom-free at the moment.  History of lung cancer: Follows with Dr. Myna Hidalgo.  History of PE: On Eliquis PTA, currently on full dose heparin drip.  Hyperlipidemia: Continue atorvastatin.  COPD: Stable, not in acute exacerbation.  Continue home medications.  DVT prophylaxis: Heparin   Code Status: Full Code  Family Communication:  None present at bedside.  Plan of care discussed with patient in length and he/she verbalized understanding and agreed with it.  Status is: Inpatient Remains inpatient appropriate because: Cardiology plans to do cardiac cath on Monday.   Estimated body mass index is 29.96 kg/m as calculated from the following:   Height as of this encounter: 5\' 5"  (1.651 m).   Weight as of this encounter: 81.7 kg.    Nutritional Assessment: Body mass index is 29.96 kg/m.Marland Kitchen Seen by dietician.  I agree with the assessment and plan as  outlined below: Nutrition Status:        . Skin Assessment: I have examined the patient's skin and I agree with the wound assessment as performed by the wound care RN as outlined below:    Consultants:  Cardiology  Procedures:  None  Antimicrobials:  Anti-infectives (From admission, onward)    None         Subjective: Seen and examined, doing very well without any complaints when I saw him however he said that he had another episode of jaw pain with diaphoresis and some dizziness 15 minutes before I saw him and this is his fourth episode in last 1 week.  The episodes last for 2 to 2.5 minutes.  Objective: Vitals:   10/10/23 2145 10/10/23 2200 10/11/23 0126 10/11/23 0544  BP: 113/78 112/75 (!) 155/91 128/86  Pulse: 77 82 85 73  Resp: 18 17  16   Temp:   98.4 F (36.9 C) 97.8 F (36.6 C)  TempSrc:   Oral Oral  SpO2: 94% 92% 95% 96%  Weight:      Height:        Intake/Output Summary (Last 24 hours) at 10/11/2023 0935 Last data filed at 10/11/2023 0324 Gross per 24 hour  Intake 81.11 ml  Output --  Net 81.11 ml   Filed Weights   10/10/23 1158  Weight: 81.7 kg    Examination:  General exam: Appears calm and comfortable  Respiratory system: Clear to auscultation. Respiratory effort normal. Cardiovascular system: S1 & S2 heard, RRR. No JVD, murmurs, rubs, gallops or clicks. No pedal edema. Gastrointestinal system:  Abdomen is nondistended, soft and nontender. No organomegaly or masses felt. Normal bowel sounds heard. Central nervous system: Alert and oriented. No focal neurological deficits. Extremities: Symmetric 5 x 5 power. Skin: No rashes, lesions or ulcers Psychiatry: Judgement and insight appear normal. Mood & affect appropriate.    Data Reviewed: I have personally reviewed following labs and imaging studies  CBC: Recent Labs  Lab 10/10/23 0925 10/10/23 1134 10/11/23 0100  WBC 7.6 7.5 7.9  NEUTROABS 6.0  --   --   HGB 14.4 14.6 14.8  HCT 42.0  42.1 42.3  MCV 87.3 86.3 86.5  PLT 207 224 230   Basic Metabolic Panel: Recent Labs  Lab 10/10/23 0925 10/10/23 1134  NA 136 135  K 3.9 3.8  CL 101 99  CO2 28 28  GLUCOSE 95 105*  BUN 15 14  CREATININE 0.93 0.98  CALCIUM 9.0 8.9  MG  --  1.8   GFR: Estimated Creatinine Clearance: 82.9 mL/min (by C-G formula based on SCr of 0.98 mg/dL). Liver Function Tests: Recent Labs  Lab 10/10/23 0925  AST 19  ALT 34  ALKPHOS 52  BILITOT 0.5  PROT 6.9  ALBUMIN 4.2   No results for input(s): "LIPASE", "AMYLASE" in the last 168 hours. No results for input(s): "AMMONIA" in the last 168 hours. Coagulation Profile: Recent Labs  Lab 10/10/23 1134  INR 1.0   Cardiac Enzymes: No results for input(s): "CKTOTAL", "CKMB", "CKMBINDEX", "TROPONINI" in the last 168 hours. BNP (last 3 results) No results for input(s): "PROBNP" in the last 8760 hours. HbA1C: No results for input(s): "HGBA1C" in the last 72 hours. CBG: No results for input(s): "GLUCAP" in the last 168 hours. Lipid Profile: No results for input(s): "CHOL", "HDL", "LDLCALC", "TRIG", "CHOLHDL", "LDLDIRECT" in the last 72 hours. Thyroid Function Tests: Recent Labs    10/10/23 0925  TSH 1.821  T4TOTAL 6.7   Anemia Panel: No results for input(s): "VITAMINB12", "FOLATE", "FERRITIN", "TIBC", "IRON", "RETICCTPCT" in the last 72 hours. Sepsis Labs: No results for input(s): "PROCALCITON", "LATICACIDVEN" in the last 168 hours.  No results found for this or any previous visit (from the past 240 hours).   Radiology Studies: CT Angio Chest (PE) W or WO Contrast Result Date: 10/10/2023 CLINICAL DATA:  Pulmonary embolism (PE) suspected, high prob * Tracking Code: BO * EXAM: CT ANGIOGRAPHY CHEST WITH CONTRAST TECHNIQUE: Multidetector CT imaging of the chest was performed using the standard protocol during bolus administration of intravenous contrast. Multiplanar CT image reconstructions and MIPs were obtained to evaluate the  vascular anatomy. RADIATION DOSE REDUCTION: This exam was performed according to the departmental dose-optimization program which includes automated exposure control, adjustment of the mA and/or kV according to patient size and/or use of iterative reconstruction technique. CONTRAST:  75mL OMNIPAQUE IOHEXOL 350 MG/ML SOLN COMPARISON:  CT angiography chest from 04/23/2023. FINDINGS: Cardiovascular: No evidence of embolism to the proximal subsegmental pulmonary artery level. Normal cardiac size. No pericardial effusion. No aortic aneurysm. There are coronary artery calcifications, in keeping with coronary artery disease. There are also mild peripheral atherosclerotic vascular calcifications of thoracic aorta and its major branches. Mediastinum/Nodes: Visualized thyroid gland appears grossly unremarkable. No solid / cystic mediastinal masses. The esophagus is nondistended precluding optimal assessment. No axillary, mediastinal or hilar lymphadenopathy by size criteria. Lungs/Pleura: The central tracheo-bronchial tree is patent. Moderate to severe upper lobe predominant centrilobular emphysema noted. There is focal scarring and bandlike opacity in the left hilar region with extension into the left upper lobe, overall  improved since the prior study, suggesting favorable treatment response. There is also interval decrease in the nodule in the left lung lower lobe currently measuring 1.0 x 1.5 cm, previously 1.6 x 2.0 cm. No new mass or consolidation. No pleural effusion or pneumothorax. No suspicious lung nodules. Upper Abdomen: Visualized upper abdominal viscera within normal limits. Musculoskeletal: A CT Port-a-Cath is seen in the right upper chest wall with the catheter terminating in the cavo-atrial junction region. Visualized soft tissues of the chest wall are otherwise grossly unremarkable. No suspicious osseous lesions. There are mild multilevel degenerative changes in the visualized spine. Old healed right first  rib fracture noted. Review of the MIP images confirms the above findings. IMPRESSION: 1. No embolism to the proximal subsegmental pulmonary artery level. 2. Interval decrease in the size of left hilar/upper lobe mass and left lower lobe nodule, compatible with favorable treatment response. No new lung mass or consolidation. No lymphadenopathy by size criteria. 3. Multiple other nonacute observations, as described above. Aortic Atherosclerosis (ICD10-I70.0) and Emphysema (ICD10-J43.9). NO Electronically Signed   By: Jules Schick M.D.   On: 10/10/2023 15:38   DG Chest Port 1 View Result Date: 10/10/2023 CLINICAL DATA:  Chest pain, lung cancer EXAM: PORTABLE CHEST 1 VIEW COMPARISON:  02/13/2023 FINDINGS: Right chest port remains in place. Normal heart size. Scarring or atelectasis in the left perihilar region. Elsewhere, no focal consolidation. No pleural effusion or pneumothorax. IMPRESSION: Scarring or atelectasis in the left perihilar region. No acute cardiopulmonary findings. Electronically Signed   By: Duanne Guess D.O.   On: 10/10/2023 14:19    Scheduled Meds:  amLODipine  2.5 mg Oral Daily   atorvastatin  40 mg Oral Daily   Chlorhexidine Gluconate Cloth  6 each Topical Daily   fluticasone furoate-vilanterol  1 puff Inhalation Daily   And   umeclidinium bromide  1 puff Inhalation Daily   folic acid  1 mg Oral Daily   isosorbide mononitrate  30 mg Oral Daily   Continuous Infusions:  heparin 1,300 Units/hr (10/11/23 0324)     LOS: 0 days   Hughie Closs, MD [facility]  10/11/2023, 9:35 AM   *Please note that this is a verbal dictation therefore any spelling or grammatical errors are due to the "Dragon [employer] One" system interpretation.  Please page via Amion and do not message via secure chat for urgent patient care matters. Secure chat can be used for non urgent patient care matters.  How to contact the [facility] Attending or Consulting provider 7A - 7P or covering provider  during after hours 7P -7A, for this patient?  Check the care team in [facility] and look for a) attending/consulting [facility] provider listed and b) the [facility] team listed. Page or secure chat 7A-7P. Log into [email or web address] and use [place]'s universal password to access. If you do not have the password, please contact the hospital operator. Locate the [facility] provider you are looking for under [facility] and page to a number that you can be directly reached. If you still have difficulty reaching the provider, please page the [facility] (Director on Call) for the Hospitalists listed on amion for assistance.

## 2023-10-11 NOTE — H&P (Signed)
 History and Physical    Brandon Phillips. GUY:[id number] DOB: September 16, 1965 DOA: 10/10/2023  PCP: [name], NP  Patient coming from: home  I have personally briefly reviewed patient's old medical records in [facility]  Chief Complaint: chest pain    HPI: Brandon Phillips. is a 58 y.o. male with medical history significant of  lung cancer on immuotherapy, HLD, COPD, PE on low dose Eliquis,hx of tobacco abuse who presents for Lung ca  follow up and on ROS was noted to have complaint of intermittent chest pain over the last few days  with associated diaphoresis,nausea, tachycardia and radiation to of pain to his jaw. Patient denies any associated exacerbation with of pain with exertion.  However patient has cardio-mobile EKG  which noted ventricular tachycardia during episodes. Patient was then referred to ED for further evaluation.Patient notes no pain since admit and states he feel at his baseline. NO current n/v/d/ sob.   ED Course:  IN ED  Vitals: afeb, bp 136/85, hr 73, rr 18, sat 95%  EKG: nsr hr 72 Labs Wbc 7.5, hgb 14.6, plt 224,  BmP Na 135, K 3.8, CL 00 glu 105 Cr 0.98,  Mag `1.8 CTPE:  IMPRESSION: 1. No embolism to the proximal subsegmental pulmonary artery level. 2. Interval decrease in the size of left hilar/upper lobe mass and left lower lobe nodule, compatible with favorable treatment response. No new lung mass or consolidation. No lymphadenopathy by size criteria. 3. Multiple other nonacute observations, as described above. CE 5,5, Tx KCL, heparin drip  mag 2gram  OF note case was discussed with Dr Wyline Mood from Cardiology who agreed  with admission and heparin infusion per EDP  Review of Systems: As per HPI otherwise 10 point review of systems negative.   Past Medical History:  Diagnosis Date   Cancer (HCC)    lung cancer   CAP (community acquired pneumonia)    COPD (chronic obstructive pulmonary disease) (HCC)    Eczema    HLD  (hyperlipidemia)    Primary cancer of left lower lobe of lung (HCC) 09/17/2022   Tobacco use disorder 01/24/2017    Past Surgical History:  Procedure Laterality Date   BRONCHIAL BIOPSY  08/29/2022   Procedure: BRONCHIAL BIOPSIES;  Surgeon: [name], MD;  Location[facility] ENDOSCOPY;  Service: Pulmonary;;   BRONCHIAL BRUSHINGS  08/29/2022   Procedure: BRONCHIAL BRUSHINGS;  Surgeon: [name], MD;  Location[facility] ENDOSCOPY;  Service: Pulmonary;;   BRONCHIAL NEEDLE ASPIRATION BIOPSY  08/29/2022   Procedure: BRONCHIAL NEEDLE ASPIRATION BIOPSIES;  Surgeon: [name], MD;  Location: [facility] ENDOSCOPY;  Service: Pulmonary;;   BRONCHIAL WASHINGS  08/29/2022   Procedure: BRONCHIAL WASHINGS;  Surgeon: [name], MD;  Location: [facility] ENDOSCOPY;  Service: Pulmonary;;   FIDUCIAL MARKER PLACEMENT  08/29/2022   Procedure: FIDUCIAL MARKER PLACEMENT;  Surgeon: [name], MD;  Location: [facility] ENDOSCOPY;  Service: Pulmonary;;   HERNIA REPAIR     as an infant   IR IMAGING GUIDED PORT INSERTION  10/03/2022   IR RADIOLOGIST EVAL & MGMT  10/21/2022   VIDEO BRONCHOSCOPY WITH ENDOBRONCHIAL ULTRASOUND  08/29/2022   Procedure: VIDEO BRONCHOSCOPY WITH ENDOBRONCHIAL ULTRASOUND;  Surgeon: [name], MD;  Location: [facility] ENDOSCOPY;  Service: Pulmonary;;     reports that he quit smoking about 6 years ago. His smoking use included cigarettes. He started smoking about 41 years ago. He has a 52.5 pack-year smoking history. He has never used smokeless tobacco. He  reports that he does not drink alcohol and does not use drugs.  No Known Allergies  History reviewed. No pertinent family history.  Prior to Admission medications   Medication Sig Start Date End Date Taking? Authorizing Provider  albuterol (PROVENTIL) (2.5 MG/3ML) 0.083% nebulizer solution Take 3 mLs (2.5 mg total) by nebulization every 6 (six) hours as needed for wheezing or shortness of breath. 11/19/22  Yes Cobb, Ruby Cola, NP   albuterol (VENTOLIN HFA) 108 (90 Base) MCG/ACT inhaler Inhale 2 puffs into the lungs every 6 (six) hours as needed for wheezing. 01/04/22  Yes [name], NP  apixaban (ELIQUIS) 2.5 MG TABS tablet Take 1 tablet (2.5 mg total) by mouth 2 (two) times daily. 05/12/23  Yes [name], [name], MD  atorvastatin (LIPITOR) 20 MG tablet TAKE ONE TABLET BY MOUTH DAILY 01/07/23  Yes [name], NP  Budeson-Glycopyrrol-Formoterol (BREZTRI AEROSPHERE) 160-9-4.8 MCG/ACT AERO Inhale 2 puffs into the lungs in the morning and at bedtime. 10/29/22  Yes [name], MD  COENZYME Q10 PO Take 100 mg by mouth in the morning.   Yes [provider]  gabapentin (NEURONTIN) 300 MG capsule Take 1 capsule (300 mg total) by mouth 2 (two) times daily. TAKE 1 CAPSULE BY MOUTH EVERY NIGHT AT BEDTIME FOR 3 NIGHTS, THEN INCREASE TO TAKE 1 CAPSULE BY MOUTH 2 TIMES A DAY FOR 1 WEEK, THEN OK TO INCREASE TO TAKE 1 CAPSULE BY MOUTH 3 TIMES A DAY 06/09/23 12/06/23 Yes Covington, [name], [name]  GUAIFENESIN 1200 PO Take 1,200 mg by mouth in the morning and at bedtime.   Yes [provider]  lidocaine-prilocaine (EMLA) cream Apply a dime size to port-a-cath 1-2 hours prior to access. Cover with saran wrap. 09/23/22  Yes [name], MD  Multiple Vitamin (MULTIVITAMIN) capsule Take 1 capsule by mouth at bedtime.   Yes [provider]  telmisartan (MICARDIS) 20 MG tablet Take 1 tablet (20 mg total) by mouth daily. Patient taking differently: Take 20 mg by mouth at bedtime. 09/12/23 12/11/23 Yes [name], [name], MD  folic acid (FOLVITE) 1 MG tablet TAKE 1 TABLET BY MOUTH DAILY. START 7 DAYS BEFORE PEMETREXED CHEMOTHERAPY, CONTINUE UNTIL 21 DAYS AFTER PEMETREXED COMPLETED 10/10/23   [name], MD    Physical Exam: Vitals:   10/10/23 2100 10/10/23 2145 10/10/23 2200 10/11/23 0126  BP: 124/78 113/78 112/75 (!) 155/91  Pulse: 84 77 82 85  Resp: 16 18 17    Temp:    98.4 F (36.9 C)  TempSrc:    Oral   SpO2: 93% 94% 92% 95%  Weight:      Height:        Constitutional: NAD, calm, comfortable Vitals:   10/10/23 2100 10/10/23 2145 10/10/23 2200 10/11/23 0126  BP: 124/78 113/78 112/75 (!) 155/91  Pulse: 84 77 82 85  Resp: 16 18 17    Temp:    98.4 F (36.9 C)  TempSrc:    Oral  SpO2: 93% 94% 92% 95%  Weight:      Height:       Eyes: PERRL, lids and conjunctivae normal ENMT: Mucous membranes are moist. Posterior pharynx clear of any exudate or lesions.Normal dentition.  Neck: normal, supple, no masses, no thyromegaly Respiratory: clear to auscultation bilaterally, no wheezing, no crackles. Normal respiratory effort. No accessory muscle use.  Cardiovascular: Regular rate and rhythm, no murmurs / rubs / gallops. No extremity edema. 2+ pedal pulses. Abdomen: no tenderness, no masses palpated. No hepatosplenomegaly. Bowel  sounds positive.  Musculoskeletal: no clubbing / cyanosis. No joint deformity upper and lower extremities. Good ROM, no contractures. Normal muscle tone.  Skin: no rashes, lesions, ulcers. No induration Neurologic: CN 2-12 grossly intact. Sensation intact, . Strength 5/5 in all 4.  Psychiatric: Normal judgment and insight. Alert and oriented x 3. Normal mood.    Labs on Admission: I have personally reviewed following labs and imaging studies  CBC: Recent Labs  Lab 10/10/23 0925 10/10/23 1134 10/11/23 0100  WBC 7.6 7.5 7.9  NEUTROABS 6.0  --   --   HGB 14.4 14.6 14.8  HCT 42.0 42.1 42.3  MCV 87.3 86.3 86.5  PLT 207 224 230   Basic Metabolic Panel: Recent Labs  Lab 10/10/23 0925 10/10/23 1134  NA 136 135  K 3.9 3.8  CL 101 99  CO2 28 28  GLUCOSE 95 105*  BUN 15 14  CREATININE 0.93 0.98  CALCIUM 9.0 8.9  MG  --  1.8   GFR: Estimated Creatinine Clearance: 82.9 mL/min (by C-G formula based on SCr of 0.98 mg/dL). Liver Function Tests: Recent Labs  Lab 10/10/23 0925  AST 19  ALT 34  ALKPHOS 52  BILITOT 0.5  PROT 6.9  ALBUMIN 4.2   No  results for input(s): "LIPASE", "AMYLASE" in the last 168 hours. No results for input(s): "AMMONIA" in the last 168 hours. Coagulation Profile: Recent Labs  Lab 10/10/23 1134  INR 1.0   Cardiac Enzymes: No results for input(s): "CKTOTAL", "CKMB", "CKMBINDEX", "TROPONINI" in the last 168 hours. BNP (last 3 results) No results for input(s): "PROBNP" in the last 8760 hours. HbA1C: No results for input(s): "HGBA1C" in the last 72 hours. CBG: No results for input(s): "GLUCAP" in the last 168 hours. Lipid Profile: No results for input(s): "CHOL", "HDL", "LDLCALC", "TRIG", "CHOLHDL", "LDLDIRECT" in the last 72 hours. Thyroid Function Tests: Recent Labs    10/10/23 0925  TSH 1.821   Anemia Panel: No results for input(s): "VITAMINB12", "FOLATE", "FERRITIN", "TIBC", "IRON", "RETICCTPCT" in the last 72 hours. Urine analysis: No results found for: "COLORURINE", "APPEARANCEUR", "LABSPEC", "PHURINE", "GLUCOSEU", "HGBUR", "BILIRUBINUR", "KETONESUR", "PROTEINUR", "UROBILINOGEN", "NITRITE", "LEUKOCYTESUR"  Radiological Exams on Admission: CT Angio Chest (PE) W or WO Contrast Result Date: 10/10/2023 CLINICAL DATA:  Pulmonary embolism (PE) suspected, high prob * Tracking Code: BO * EXAM: CT ANGIOGRAPHY CHEST WITH CONTRAST TECHNIQUE: Multidetector CT imaging of the chest was performed using the standard protocol during bolus administration of intravenous contrast. Multiplanar CT image reconstructions and MIPs were obtained to evaluate the vascular anatomy. RADIATION DOSE REDUCTION: This exam was performed according to the departmental dose-optimization program which includes automated exposure control, adjustment of the mA and/or kV according to patient size and/or use of iterative reconstruction technique. CONTRAST:  75mL OMNIPAQUE IOHEXOL 350 MG/ML SOLN COMPARISON:  CT angiography chest from 04/23/2023. FINDINGS: Cardiovascular: No evidence of embolism to the proximal subsegmental pulmonary artery  level. Normal cardiac size. No pericardial effusion. No aortic aneurysm. There are coronary artery calcifications, in keeping with coronary artery disease. There are also mild peripheral atherosclerotic vascular calcifications of thoracic aorta and its major branches. Mediastinum/Nodes: Visualized thyroid gland appears grossly unremarkable. No solid / cystic mediastinal masses. The esophagus is nondistended precluding optimal assessment. No axillary, mediastinal or hilar lymphadenopathy by size criteria. Lungs/Pleura: The central tracheo-bronchial tree is patent. Moderate to severe upper lobe predominant centrilobular emphysema noted. There is focal scarring and bandlike opacity in the left hilar region with extension into the left upper lobe, overall improved since  the prior study, suggesting favorable treatment response. There is also interval decrease in the nodule in the left lung lower lobe currently measuring 1.0 x 1.5 cm, previously 1.6 x 2.0 cm. No new mass or consolidation. No pleural effusion or pneumothorax. No suspicious lung nodules. Upper Abdomen: Visualized upper abdominal viscera within normal limits. Musculoskeletal: A CT Port-a-Cath is seen in the right upper chest wall with the catheter terminating in the cavo-atrial junction region. Visualized soft tissues of the chest wall are otherwise grossly unremarkable. No suspicious osseous lesions. There are mild multilevel degenerative changes in the visualized spine. Old healed right first rib fracture noted. Review of the MIP images confirms the above findings. IMPRESSION: 1. No embolism to the proximal subsegmental pulmonary artery level. 2. Interval decrease in the size of left hilar/upper lobe mass and left lower lobe nodule, compatible with favorable treatment response. No new lung mass or consolidation. No lymphadenopathy by size criteria. 3. Multiple other nonacute observations, as described above. Aortic Atherosclerosis (ICD10-I70.0) and  Emphysema (ICD10-J43.9). NO Electronically Signed   By: Jules Schick M.D.   On: 10/10/2023 15:38   DG Chest Port 1 View Result Date: 10/10/2023 CLINICAL DATA:  Chest pain, lung cancer EXAM: PORTABLE CHEST 1 VIEW COMPARISON:  02/13/2023 FINDINGS: Right chest port remains in place. Normal heart size. Scarring or atelectasis in the left perihilar region. Elsewhere, no focal consolidation. No pleural effusion or pneumothorax. IMPRESSION: Scarring or atelectasis in the left perihilar region. No acute cardiopulmonary findings. Electronically Signed   By: Duanne Guess D.O.   On: 10/10/2023 14:19    EKG: Independently reviewed. See above  Assessment/Plan  Unstable angina with associated  Ventricular tachycardia  -admit to cardiac tele  - continue with heparin  -beta beta blocker /statin  -ensure electrolytes are repleted -echo in am  -f/u on further cardiology recommendation    Lung Cancer -currently on therapy  -followed by DR Drue Dun  Hx of PE -eliquis on hold while on full dose heparin   HLD -continue with atorvastatin      COPD - no acute exacerbation  - resume home controller medication with Breztri, prn nebs    Hx of PE  -on low dose Eliquis -resume once heparin d/ced    DVT prophylaxis: heparin Code Status: full/ as discussed per patient wishes in event of cardiac arrest  Family Communication: full/ as discussed per patient wishes in event of cardiac arrest  Disposition Plan: patient  expected to be admitted greater than 2 midnights  Consults called: cardiology  Dr Lavell Anchors Admission status: progressive care    Lurline Del MD [facility]   If 7PM-7AM, please contact night-coverage [email or web address] Password [email or web address]  10/11/2023, 3:10 AM

## 2023-10-11 NOTE — Progress Notes (Addendum)
 PHARMACY - ANTICOAGULATION CONSULT NOTE  Pharmacy Consult for heparin Indication: chest pain/ACS, on Eliquis PTA for hx of PE   No Known Allergies  Patient Measurements: Height: 5\' 5"  (165.1 cm) Weight: 81.7 kg (180 lb 0.7 oz) IBW/kg (Calculated) : 61.5 Heparin Dosing Weight: 78.3g   Vital Signs: Temp: 97.8 F (36.6 C) (03/15 0544) Temp Source: Oral (03/15 0544) BP: 121/90 (03/15 1048) Pulse Rate: 73 (03/15 0544)  Labs: Recent Labs    10/10/23 0925 10/10/23 1134 10/10/23 1347 10/11/23 0100 10/11/23 0456  HGB 14.4 14.6  --  14.8  --   HCT 42.0 42.1  --  42.3  --   PLT 207 224  --  230  --   APTT  --   --   --  41* 50*  LABPROT  --  13.2  --   --   --   INR  --  1.0  --   --   --   CREATININE 0.93 0.98  --   --   --   TROPONINIHS  --  5 5  --   --     Estimated Creatinine Clearance: 82.9 mL/min (by C-G formula based on SCr of 0.98 mg/dL).   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  Trop resulted at 5. Has been having runs of Vtach seen on EKG in ED. On Eliquis PTA for hx of PE. Last dose of Eliquis 3/14 AM around 07:00.   aPTT was subtherapeutic this morning, upon recheck patient refused labs and sent phlebotomy away. After discussion with MD, okay to do lovenox in the interim with potential plans for a cardiac cath.   Addendum: Per RN, patient has a portacath and is able to get levels. Heparin never discontinued. Will continue on heparin and f/u after cath.   aPTT this morning still subtherapeutic despite rate increase on 1300 units/hr, CBC stable. Per RN no issues with heparin running  continuously or signs of bleeding.   Goal of Therapy:  Heparin level 0.3-0.7 units/ml aPTT 66-102 Monitor platelets by anticoagulation protocol: Yes   Plan:  Increase heparin to 1500 units/hr  6 hour heparin level and aPTT Heparin level and aPTT until levels correlate Continue to monitor H&H and platelets F/u if plans for cath/ transition back to eliquis  Thank you for involving pharmacy in the patient's care.   Theotis Burrow, PharmD PGY1 Acute Care Pharmacy Resident  10/11/2023 1:02 PM

## 2023-10-11 NOTE — Consult Note (Signed)
 Brandon Phillips is well-known to me.  He is a very nice 58 year old white male.  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  He has received in the past chemotherapy with radiation therapy.  He did have some radiosurgery.  He now is on maintenance immunotherapy with Durvalumab.  He had also been on Eliquis 2.5 mg p.o. twice daily for a subsegmental right pulmonary embolism.  When we saw in the office yesterday he had been noting some jaw pain with some left arm pain.  He had this [employer] mobile on his cell phone which showed that he had tachycardia.  There is some EKG changes also.  He subsequently was taken to the ER and then admitted.  He now is on IV heparin.  He has been seen by cardiology.  He did have a CT angiogram.  Thankfully, this did not show any pulmonary embolus.  More importantly is the fact that his lung tumor had shrunken somewhat.  As such, immunotherapy have been working.  He is not have any pain.  His blood pressure is doing well.  He did have an episode of tachycardia this morning.  His labs today show a white count of 7.9.  Hemoglobin 14.8.  Platelet count 230,000.  He has had no leg pain.  He has had no cough.  There is no chest wall pain.  On his physical exam, vital signs are temperature of 98.1.  Pulse 73.  Blood pressure 121/90.  His lungs sound clear bilaterally.  Cardiac exam regular rate and rhythm.  He has occasional extra beat.  Abdomen is soft.  Bowel sounds are present.  There is no fluid wave.  There is no palpable liver or spleen tip.  Extremity shows no clubbing, cyanosis or edema.  Skin exam shows no rashes.  Neurological exam is nonfocal.   I am happy that his lung cancer is responding to immunotherapy.  At this point, we have some flexibility as to what we can restart the immunotherapy.  The main focus for now is cardiac.  I am sure that Cardiology will be very proactive with him.  For right now there is really not much for [place] to do.  We will be  available if there are any issues with respect to his cancer with his blood counts.  Again he is responding to treatment.  If, for some reason, he needs to have cardiac surgery, I do not see any contraindication for this.  I know that he will get incredible care from everybody upon 6 E.   [name], MD  1 Remi Deter 16:7

## 2023-10-11 NOTE — Plan of Care (Signed)
   Problem: Education: Goal: Knowledge of General Education information will improve Description Including pain rating scale, medication(s)/side effects and non-pharmacologic comfort measures Outcome: Progressing

## 2023-10-11 NOTE — Progress Notes (Signed)
 PHARMACY - ANTICOAGULATION CONSULT NOTE  Pharmacy Consult for heparin Indication: chest pain/ACS, on Eliquis PTA for hx of PE   No Known Allergies  Patient Measurements: Height: 5\' 5"  (165.1 cm) Weight: 81.7 kg (180 lb 0.7 oz) IBW/kg (Calculated) : 61.5 Heparin Dosing Weight: 78.3g   Vital Signs: Temp: 97.8 F (36.6 C) (03/15 2147) Temp Source: Oral (03/15 2147) BP: 111/75 (03/15 2147) Pulse Rate: 87 (03/15 2147)  Labs: Recent Labs    10/10/23 0925 10/10/23 1134 10/10/23 1347 10/11/23 0100 10/11/23 0100 10/11/23 0456 10/11/23 1054 10/11/23 2100  HGB 14.4 14.6  --  14.8  --   --   --   --   HCT 42.0 42.1  --  42.3  --   --   --   --   PLT 207 224  --  230  --   --   --   --   APTT  --   --   --  41*   < > 50* 50* 79*  LABPROT  --  13.2  --   --   --   --   --   --   INR  --  1.0  --   --   --   --   --   --   HEPARINUNFRC  --   --   --   --   --   --   --  0.63  CREATININE 0.93 0.98  --   --   --   --   --   --   TROPONINIHS  --  5 5  --   --   --   --   --    < > = values in this interval not displayed.    Estimated Creatinine Clearance: 82.9 mL/min (by C-G formula based on SCr of 0.98 mg/dL).   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  Trop resulted at 5. Has been having runs of Vtach seen on EKG in ED. On Eliquis PTA for hx of PE. Last dose of Eliquis 3/14 AM around 07:00.   aPTT and heparin level both therapeutic, close to correlating.   Goal of Therapy:  Heparin level 0.3-0.7 units/ml aPTT 66-102 Monitor platelets by anticoagulation protocol: Yes   Plan:  Continue heparin  1500 units/h Daily aPTT, HL, CBC  Fredonia Highland, PharmD, [place] Clinical Pharmacist [phone] Please check AMION for all [facility] Pharmacy numbers 10/11/2023

## 2023-10-11 NOTE — Progress Notes (Signed)
 RT @ bedside to give inhalers. Patient eating, RT responded to code blue on floor. Returned to patients room to attempt inhalers, patient starting to received echocardiogram.

## 2023-10-11 NOTE — Progress Notes (Signed)
 PHARMACY - ANTICOAGULATION CONSULT NOTE  Pharmacy Consult for heparin  Indication: chest pain/ACS, on Eliquis PTA for hx of PE   No Known Allergies  Patient Measurements: Height: 5\' 5"  (165.1 cm) Weight: 81.7 kg (180 lb 0.7 oz) IBW/kg (Calculated) : 61.5 Heparin Dosing Weight: 78.3g   Vital Signs: Temp: 98.4 F (36.9 C) (03/15 0126) Temp Source: Oral (03/15 0126) BP: 155/91 (03/15 0126) Pulse Rate: 85 (03/15 0126)  Labs: Recent Labs    10/10/23 0925 10/10/23 1134 10/10/23 1347 10/11/23 0100  HGB 14.4 14.6  --  14.8  HCT 42.0 42.1  --  42.3  PLT 207 224  --  230  APTT  --   --   --  41*  LABPROT  --  13.2  --   --   INR  --  1.0  --   --   CREATININE 0.93 0.98  --   --   TROPONINIHS  --  5 5  --     Estimated Creatinine Clearance: 82.9 mL/min (by C-G formula based on SCr of 0.98 mg/dL).   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  Trop resulted at 5. Has been having runs of Vtach seen on EKG in ED. On Eliquis PTA for hx of PE. Last dose of Eliquis 3/14 AM around 07:00.   -aPTT subtherapeutic on 1000 units/hr. Per RN, no issues with it running continuously or signs/symptoms of bleeding per RN   Goal of Therapy:  Heparin level 0.3-0.7 units/ml aPTT 66-102 Monitor platelets by anticoagulation protocol: Yes   Plan:  Increase heparin infusion to 1300 units/hr  Check aPTT level in 6 hours and daily while on heparin, check aPTT for next check given false elevation expect of Anti-xa.  Continue to monitor H&H and platelets  Arabella Merles, PharmD. Clinical Pharmacist 10/11/2023 2:34 AM

## 2023-10-11 NOTE — Progress Notes (Signed)
 Patient stating he thought he had another "episode", noted sensation in jaw.  On telemetry assessment, ST elevation noted(at 0807).  Went to assess patient, denied chest pain but did endorse the jaw sensation.  Dr. Graciela Husbands at desk, asked him to review telemetry, stated he would see patient, no new orders received at this time.

## 2023-10-11 NOTE — Progress Notes (Signed)
*  PRELIMINARY RESULTS* Echocardiogram 2D Echocardiogram has been performed.  Stacey Drain 10/11/2023, 3:40 PM

## 2023-10-12 DIAGNOSIS — I201 Angina pectoris with documented spasm: Secondary | ICD-10-CM | POA: Diagnosis not present

## 2023-10-12 DIAGNOSIS — I200 Unstable angina: Secondary | ICD-10-CM | POA: Diagnosis not present

## 2023-10-12 LAB — CBC
HCT: 40.9 % (ref 39.0–52.0)
Hemoglobin: 14.3 g/dL (ref 13.0–17.0)
MCH: 30.2 pg (ref 26.0–34.0)
MCHC: 35 g/dL (ref 30.0–36.0)
MCV: 86.3 fL (ref 80.0–100.0)
Platelets: 213 10*3/uL (ref 150–400)
RBC: 4.74 MIL/uL (ref 4.22–5.81)
RDW: 13.7 % (ref 11.5–15.5)
WBC: 8.1 10*3/uL (ref 4.0–10.5)
nRBC: 0 % (ref 0.0–0.2)

## 2023-10-12 LAB — HEPARIN LEVEL (UNFRACTIONATED)
Heparin Unfractionated: 0.75 [IU]/mL — ABNORMAL HIGH (ref 0.30–0.70)
Heparin Unfractionated: 0.65 [IU]/mL (ref 0.30–0.70)

## 2023-10-12 LAB — HEMOGLOBIN A1C
Hgb A1c MFr Bld: 6.2 % — ABNORMAL HIGH (ref 4.8–5.6)
Mean Plasma Glucose: 131.24 mg/dL

## 2023-10-12 LAB — APTT: aPTT: 99 s — ABNORMAL HIGH (ref 24–36)

## 2023-10-12 MED ORDER — ISOSORBIDE MONONITRATE ER 30 MG PO TB24
30.0000 mg | ORAL_TABLET | Freq: Every day | ORAL | Status: AC
Start: 2023-10-12 — End: 2023-10-12
  Administered 2023-10-12: 30 mg via ORAL
  Filled 2023-10-12: qty 1

## 2023-10-12 MED ORDER — SODIUM CHLORIDE 0.9 % WEIGHT BASED INFUSION
1.0000 mL/kg/h | INTRAVENOUS | Status: DC
  Administered 2023-10-13: 250 mL via INTRAVENOUS

## 2023-10-12 MED ORDER — ASPIRIN 81 MG PO CHEW
81.0000 mg | CHEWABLE_TABLET | ORAL | Status: AC
  Administered 2023-10-13: 81 mg via ORAL
  Filled 2023-10-12: qty 1

## 2023-10-12 MED ORDER — SODIUM CHLORIDE 0.9 % WEIGHT BASED INFUSION
3.0000 mL/kg/h | INTRAVENOUS | Status: DC
  Administered 2023-10-13: 3 mL/kg/h via INTRAVENOUS

## 2023-10-12 MED ORDER — ISOSORBIDE MONONITRATE ER 60 MG PO TB24
60.0000 mg | ORAL_TABLET | Freq: Every day | ORAL | Status: DC
  Administered 2023-10-13 – 2023-10-14 (×2): 60 mg via ORAL
  Filled 2023-10-12 (×2): qty 1

## 2023-10-12 NOTE — Progress Notes (Signed)
 [name] is doing okay.  It sounds like he is going to have a cardiac cath tomorrow.  An echocardiogram yesterday.  He had good ejection fraction.  There is nothing that would suggest any type of myocarditis.  I would think this would be the only effect on the heart from immunotherapy.  He has had some arrhythmias on the heart monitor.  He feels well.  He says that when he has these arrhythmias, he does have some pain in his jaw.  He continues on heparin.  His labs today show white cell count of 8.1.  Hemoglobin 14.3.  Platelet count 213,000.  He has had no problems with bowels or bladder.  There is been no leg pain or swelling.  He has had no bleeding.  His vital signs are temperature of 97.8.  Pulse 82.  Blood pressure 124/87.  His lungs sound clear bilaterally.  He has good air movement bilaterally.  Cardiac exam regular rate and rhythm.  He has no murmurs.  Abdomen is soft.  Bowel sounds are present.  There is no fluid wave.  There is no palpable liver or spleen tip.  Extremity shows no clubbing, cyanosis or edema.  Neurological exam is nonfocal.  I really thank Cardiology for being able to cath him.  This would really help out.  I would think that if anything, the radiation therapy that he had for his lung cancer initially would be an issue.  However, typically you do not see radiotherapy effects on the heart for several years after radiation is completed.  I do not see anything that looks like myocarditis.  Hopefully, there will be unexpected findings on the cardiac cath.  What ever needs to be done for his heart would be okay from my point of view.  He has responded well to immunotherapy so I do not expect that his cancer is going to be a problem for a couple years.  I know that he is getting great care from everybody upon 6 E.  [name], MD  Psalms 145:9

## 2023-10-12 NOTE — Progress Notes (Signed)
 Progress Note  Patient Name: Brandon Phillips. Date of Encounter: 10/12/2023  Primary Cardiologist: None     Patient Profile     58 y.o. male admitted and consulted for jaw pain assoc with flushing an clammy feeling; variably associated with palpitations History of non-small cell lung cancer receiving immunotherapy and a prior pulmonary embolism on chronic anticoagulation prompted evaluation at [facility] which was negative for PE negative troponins.  But a Kardia mobile device which suggested atrial fibrillation but was not felt to be this by admitting cardiology Subjective   Continues with ST changes on the monitor, duration a few min  No new pain overnight   Inpatient Medications    Scheduled Meds:  amLODipine  2.5 mg Oral Daily   atorvastatin  40 mg Oral Daily   Chlorhexidine Gluconate Cloth  6 each Topical Daily   fluticasone furoate-vilanterol  1 puff Inhalation Daily   And   umeclidinium bromide  1 puff Inhalation Daily   folic acid  1 mg Oral Daily   isosorbide mononitrate  30 mg Oral Daily   Continuous Infusions:  heparin 1,400 Units/hr (10/12/23 0803)   PRN Meds: acetaminophen, nitroGLYCERIN, ondansetron (ZOFRAN) IV   Vital Signs    Vitals:   10/12/23 0512 10/12/23 0838 10/12/23 0914 10/12/23 0953  BP: 115/78 126/83  124/87  Pulse: 65 82    Resp: 18 18    Temp: 98.1 F (36.7 C) 97.8 F (36.6 C)    TempSrc: Oral Oral    SpO2: 96% 97% 95%   Weight:      Height:       No intake or output data in the 24 hours ending 10/12/23 1108  Filed Weights   10/10/23 1158  Weight: 81.7 kg    Telemetry    Recurrent ST elevation episodes, ( canbe seen on general review ST segment screen ( call if needed) - Personally Reviewed  ECG     - Personally Reviewed  Physical Exam    Well developed and nourished in no acute distress HENT normal Neck supple with JVP-  flat   Clear Regular rate and rhythm, no murmurs or gallops Abd-soft with  active BS No Clubbing cyanosis edema Skin-warm and dry A & Oriented  Grossly normal sensory and motor function     Labs    Chemistry Recent Labs  Lab 10/10/23 0925 10/10/23 1134  NA 136 135  K 3.9 3.8  CL 101 99  CO2 28 28  GLUCOSE 95 105*  BUN 15 14  CREATININE 0.93 0.98  CALCIUM 9.0 8.9  PROT 6.9  --   ALBUMIN 4.2  --   AST 19  --   ALT 34  --   ALKPHOS 52  --   BILITOT 0.5  --   GFRNONAA >60 >60  ANIONGAP 7 8     Hematology Recent Labs  Lab 10/10/23 1134 10/11/23 0100 10/12/23 0500  WBC 7.5 7.9 8.1  RBC 4.88 4.89 4.74  HGB 14.6 14.8 14.3  HCT 42.1 42.3 40.9  MCV 86.3 86.5 86.3  MCH 29.9 30.3 30.2  MCHC 34.7 35.0 35.0  RDW 13.9 13.9 13.7  PLT 224 230 213      Radiology    ECHOCARDIOGRAM COMPLETE Result Date: 10/11/2023    ECHOCARDIOGRAM REPORT   Patient Name:   Brandon Phillips. Date of Exam: 10/11/2023 Medical Rec #:  [id number]  Height:       65.0 in Accession #:    [id number]             Weight:       180.0 lb Date of Birth:  Jan 26, 1966             BSA:          1.892 m Patient Age:    57 years               BP:           121/90 mmHg Patient Gender: M                      HR:           73 bpm. Exam Location:  Inpatient Procedure: 2D Echo, Cardiac Doppler and Color Doppler (Both Spectral and Color            Flow Doppler were utilized during procedure). Indications:    Chest Pain R07.9  History:        Patient has prior history of Echocardiogram examinations, most                 recent 09/10/2022. Risk Factors:Hypertension, Diabetes and                 Dyslipidemia. Hx of lung cancer.  Sonographer:    Celesta Gentile RCS Referring Phys: [id number] SARA-MAIZ A THOMAS IMPRESSIONS  1. Left ventricular ejection fraction, by estimation, is 65 to 70%. The left ventricle has normal function. The left ventricle has no regional wall motion abnormalities. Left ventricular diastolic parameters are consistent with Grade I diastolic dysfunction (impaired  relaxation).  2. Right ventricular systolic function is normal. The right ventricular size is normal. Tricuspid regurgitation signal is inadequate for assessing PA pressure.  3. No evidence of mitral valve regurgitation.  4. The aortic valve was not well visualized. Aortic valve regurgitation is not visualized.  5. The inferior vena cava is normal in size with greater than 50% respiratory variability, suggesting right atrial pressure of 3 mmHg. FINDINGS  Left Ventricle: Left ventricular ejection fraction, by estimation, is 65 to 70%. The left ventricle has normal function. The left ventricle has no regional wall motion abnormalities. The left ventricular internal cavity size was normal in size. There is  no left ventricular hypertrophy. Left ventricular diastolic parameters are consistent with Grade I diastolic dysfunction (impaired relaxation). Right Ventricle: The right ventricular size is normal. Right ventricular systolic function is normal. Tricuspid regurgitation signal is inadequate for assessing PA pressure. Left Atrium: Left atrial size was normal in size. Right Atrium: Right atrial size was normal in size. Pericardium: There is no evidence of pericardial effusion. Mitral Valve: No evidence of mitral valve regurgitation. Tricuspid Valve: Tricuspid valve regurgitation is not demonstrated. Aortic Valve: The aortic valve was not well visualized. Aortic valve regurgitation is not visualized. Aortic valve mean gradient measures 11.0 mmHg. Aortic valve peak gradient measures 21.3 mmHg. Aortic valve area, by VTI measures 2.67 cm. Pulmonic Valve: The pulmonic valve was not well visualized. Pulmonic valve regurgitation is not visualized. Aorta: The aortic root and ascending aorta are structurally normal, with no evidence of dilitation. Venous: The inferior vena cava is normal in size with greater than 50% respiratory variability, suggesting right atrial pressure of 3 mmHg. IAS/Shunts: The interatrial septum was not  well visualized.  LEFT VENTRICLE PLAX 2D LVIDd:         4.20 cm  Diastology LVIDs:         2.20 cm   LV e' medial:    9.79 cm/s LV PW:         0.90 cm   LV E/e' medial:  6.5 LV IVS:        0.90 cm   LV e' lateral:   14.30 cm/s LVOT diam:     1.80 cm   LV E/e' lateral: 4.5 LV SV:         80 LV SV Index:   42 LVOT Area:     2.54 cm  RIGHT VENTRICLE RV S prime:     19.00 cm/s TAPSE (M-mode): 2.0 cm LEFT ATRIUM             Index        RIGHT ATRIUM           Index LA diam:        2.80 cm 1.48 cm/m   RA Area:     11.00 cm LA Vol (A2C):   44.1 ml 23.31 ml/m  RA Volume:   21.40 ml  11.31 ml/m LA Vol (A4C):   28.0 ml 14.80 ml/m LA Biplane Vol: 36.6 ml 19.35 ml/m  AORTIC VALVE AV Area (Vmax):    2.24 cm AV Area (Vmean):   2.27 cm AV Area (VTI):     2.67 cm AV Vmax:           231.00 cm/s AV Vmean:          150.000 cm/s AV VTI:            0.299 m AV Peak Grad:      21.3 mmHg AV Mean Grad:      11.0 mmHg LVOT Vmax:         203.00 cm/s LVOT Vmean:        134.000 cm/s LVOT VTI:          0.314 m LVOT/AV VTI ratio: 1.05  AORTA Ao Root diam: 3.50 cm MITRAL VALVE MV Area (PHT): 2.50 cm     SHUNTS MV Decel Time: 303 msec     Systemic VTI:  0.31 m MV E velocity: 63.70 cm/s   Systemic Diam: 1.80 cm MV A velocity: 119.00 cm/s MV E/A ratio:  0.54 Photographer signed by Carolan Clines Signature Date/Time: 10/11/2023/4:13:58 PM    Final    CT Angio Chest (PE) W or WO Contrast Result Date: 10/10/2023 CLINICAL DATA:  Pulmonary embolism (PE) suspected, high prob * Tracking Code: BO * EXAM: CT ANGIOGRAPHY CHEST WITH CONTRAST TECHNIQUE: Multidetector CT imaging of the chest was performed using the standard protocol during bolus administration of intravenous contrast. Multiplanar CT image reconstructions and MIPs were obtained to evaluate the vascular anatomy. RADIATION DOSE REDUCTION: This exam was performed according to the departmental dose-optimization program which includes automated exposure control, adjustment of  the mA and/or kV according to patient size and/or use of iterative reconstruction technique. CONTRAST:  75mL OMNIPAQUE IOHEXOL 350 MG/ML SOLN COMPARISON:  CT angiography chest from 04/23/2023. FINDINGS: Cardiovascular: No evidence of embolism to the proximal subsegmental pulmonary artery level. Normal cardiac size. No pericardial effusion. No aortic aneurysm. There are coronary artery calcifications, in keeping with coronary artery disease. There are also mild peripheral atherosclerotic vascular calcifications of thoracic aorta and its major branches. Mediastinum/Nodes: Visualized thyroid gland appears grossly unremarkable. No solid / cystic mediastinal masses. The esophagus is nondistended precluding optimal assessment. No axillary, mediastinal or hilar lymphadenopathy by size criteria.  Lungs/Pleura: The central tracheo-bronchial tree is patent. Moderate to severe upper lobe predominant centrilobular emphysema noted. There is focal scarring and bandlike opacity in the left hilar region with extension into the left upper lobe, overall improved since the prior study, suggesting favorable treatment response. There is also interval decrease in the nodule in the left lung lower lobe currently measuring 1.0 x 1.5 cm, previously 1.6 x 2.0 cm. No new mass or consolidation. No pleural effusion or pneumothorax. No suspicious lung nodules. Upper Abdomen: Visualized upper abdominal viscera within normal limits. Musculoskeletal: A CT Port-a-Cath is seen in the right upper chest wall with the catheter terminating in the cavo-atrial junction region. Visualized soft tissues of the chest wall are otherwise grossly unremarkable. No suspicious osseous lesions. There are mild multilevel degenerative changes in the visualized spine. Old healed right first rib fracture noted. Review of the MIP images confirms the above findings. IMPRESSION: 1. No embolism to the proximal subsegmental pulmonary artery level. 2. Interval decrease in the  size of left hilar/upper lobe mass and left lower lobe nodule, compatible with favorable treatment response. No new lung mass or consolidation. No lymphadenopathy by size criteria. 3. Multiple other nonacute observations, as described above. Aortic Atherosclerosis (ICD10-I70.0) and Emphysema (ICD10-J43.9). NO Electronically Signed   By: Jules Schick M.D.   On: 10/10/2023 15:38   DG Chest Port 1 View Result Date: 10/10/2023 CLINICAL DATA:  Chest pain, lung cancer EXAM: PORTABLE CHEST 1 VIEW COMPARISON:  02/13/2023 FINDINGS: Right chest port remains in place. Normal heart size. Scarring or atelectasis in the left perihilar region. Elsewhere, no focal consolidation. No pleural effusion or pneumothorax. IMPRESSION: Scarring or atelectasis in the left perihilar region. No acute cardiopulmonary findings. Electronically Signed   By: Duanne Guess D.O.   On: 10/10/2023 14:19    Cardiac Studies   2/24 echocardiogram EF 70% 3/25 EF 65-70% Assessment & Plan    Cardiac spasm with ST segment elevation  Question tachycardia on his Kardia mobile device  Non-small cell lung cancer on immunotherapy  Pulmonary embolism on chronic anticoagulation  Prior cigarette use   With ongoing spasm, will continue IV heparin, increase imdur, and amlodipine Anticipate CAth in am  Literature search identifies spasm as assoc with this check point inhibitor        For questions or updates, please contact [facility] Please consult [email or web address] for contact info under Cardiology/STEMI.      Signed, Sherryl Manges, MD  10/12/2023, 11:08 AM

## 2023-10-12 NOTE — Progress Notes (Signed)
 PHARMACY - ANTICOAGULATION CONSULT NOTE  Pharmacy Consult for heparin Indication: chest pain/ACS, on Eliquis PTA for hx of PE   No Known Allergies  Patient Measurements: Height: 5\' 5"  (165.1 cm) Weight: 81.7 kg (180 lb 0.7 oz) IBW/kg (Calculated) : 61.5 Heparin Dosing Weight: 78.3g   Vital Signs: Temp: 98.1 F (36.7 C) (03/16 1541) Temp Source: Oral (03/16 1541) BP: 108/74 (03/16 1541) Pulse Rate: 81 (03/16 1541)  Labs: Recent Labs    10/10/23 0925 10/10/23 0925 10/10/23 1134 10/10/23 1347 10/11/23 0100 10/11/23 0456 10/11/23 1054 10/11/23 2100 10/12/23 0500 10/12/23 1603  HGB 14.4   < > 14.6  --  14.8  --   --   --  14.3  --   HCT 42.0  --  42.1  --  42.3  --   --   --  40.9  --   PLT 207  --  224  --  230  --   --   --  213  --   APTT  --   --   --   --  41*   < > 50* 79* 99*  --   LABPROT  --   --  13.2  --   --   --   --   --   --   --   INR  --   --  1.0  --   --   --   --   --   --   --   HEPARINUNFRC  --   --   --   --   --   --   --  0.63 0.75* 0.65  CREATININE 0.93  --  0.98  --   --   --   --   --   --   --   TROPONINIHS  --   --  5 5  --   --   --   --   --   --    < > = values in this interval not displayed.    Estimated Creatinine Clearance: 82.9 mL/min (by C-G formula based on SCr of 0.98 mg/dL).   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  Trop resulted at 5. Has been having runs of Vtach seen on EKG in ED. On Eliquis PTA for hx of PE. Last dose of Eliquis 3/14 AM around 07:00.   aPTT on upper end of therapeutic at 99 and heparin level slightly supratherapeutic at 0.75  on 1500 units/hr. Levels appear to be correlating, continue to monitor heparin levels going forward. No signs of bleeding or issues with infusion per RN.   PM: heparin level is therapeutic (0.65) on 1400 units/hr. No issues with the infusion or bleeding reported.  Goal of Therapy:  Heparin level 0.3-0.7 units/ml Monitor platelets by anticoagulation protocol: Yes   Plan:  Continue heparin at 1400 units/hr  Daily HL, CBC Monitor for s/sx of bleeding F/u plans after cardiac cath  Thank you for involving pharmacy in the patient's care.   Loralee Pacas, PharmD, BCPS 10/12/2023 5:07 PM  Please check AMION for all [facility] Pharmacy phone numbers After 10:00 PM, call Main Pharmacy [phone]

## 2023-10-12 NOTE — Progress Notes (Addendum)
 PHARMACY - ANTICOAGULATION CONSULT NOTE  Pharmacy Consult for heparin Indication: chest pain/ACS, on Eliquis PTA for hx of PE   No Known Allergies  Patient Measurements: Height: 5\' 5"  (165.1 cm) Weight: 81.7 kg (180 lb 0.7 oz) IBW/kg (Calculated) : 61.5 Heparin Dosing Weight: 78.3g   Vital Signs: Temp: 98.1 F (36.7 C) (03/16 0512) Temp Source: Oral (03/16 0512) BP: 115/78 (03/16 0512) Pulse Rate: 65 (03/16 0512)  Labs: Recent Labs    10/10/23 0925 10/10/23 0925 10/10/23 1134 10/10/23 1347 10/11/23 0100 10/11/23 0456 10/11/23 1054 10/11/23 2100 10/12/23 0500  HGB 14.4   < > 14.6  --  14.8  --   --   --  14.3  HCT 42.0  --  42.1  --  42.3  --   --   --  40.9  PLT 207  --  224  --  230  --   --   --  213  APTT  --   --   --   --  41*   < > 50* 79* 99*  LABPROT  --   --  13.2  --   --   --   --   --   --   INR  --   --  1.0  --   --   --   --   --   --   HEPARINUNFRC  --   --   --   --   --   --   --  0.63 0.75*  CREATININE 0.93  --  0.98  --   --   --   --   --   --   TROPONINIHS  --   --  5 5  --   --   --   --   --    < > = values in this interval not displayed.    Estimated Creatinine Clearance: 82.9 mL/min (by C-G formula based on SCr of 0.98 mg/dL).   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  Trop resulted at 5. Has been having runs of Vtach seen on EKG in ED. On Eliquis PTA for hx of PE. Last dose of Eliquis 3/14 AM around 07:00.   aPTT on upper end of therapeutic at 99 and heparin level slightly supratherapeutic at 0.75 on 1500 units/hr. Levels appear to be correlating, continue  to monitor heparin levels going forward. No signs of bleeding or issues with infusion per RN.   Goal of Therapy:  Heparin level 0.3-0.7 units/ml aPTT 66-102 Monitor platelets by anticoagulation protocol: Yes   Plan:  Reduce heparin to 1400 units/hr  6 hour heparin level Daily HL, CBC Monitor for s/sx of bleeding  Thank you for involving pharmacy in the patient's care.   Theotis Burrow, PharmD PGY1 Acute Care Pharmacy Resident  10/12/2023 7:49 AM

## 2023-10-12 NOTE — Plan of Care (Signed)
   Problem: Education: Goal: Knowledge of General Education information will improve Description Including pain rating scale, medication(s)/side effects and non-pharmacologic comfort measures Outcome: Progressing

## 2023-10-12 NOTE — Progress Notes (Signed)
 PROGRESS NOTE    Brandon Phillips.  PIR:[id number] DOB: 05/27/1966 DOA: 10/10/2023 PCP: [name], NP   Brief Narrative:  Brandon Phillips. is a 58 y.o. male with medical history significant of  lung cancer on immuotherapy, HLD, COPD, PE on low dose Eliquis,hx of tobacco abuse who presented with complaint of chest pain which was associated with radiation to the jaw and nausea and diaphoresis.  patient has cardio-mobile EKG  which noted ventricular tachycardia during episodes. He went to his oncologist and was encouraged to come to the emergency department for further evaluation.  Assessment & Plan:   Principal Problem:   Unstable angina (HCC) Active Problems:   Ventricular tachycardia (HCC)  Chest pain: Considered to be unstable angina.  Seen by cardiology.  Troponins negative-ACS ruled out.  Patient appears to have some ST elevation which was transient on the telemetry this morning.  Cardiology has made some changes, added nitroglycerin and calcium channel blockers and stopped beta-blocker.  They plan to do cardiac cath on Monday.  Patient is symptom-free at the moment.  History of lung cancer: Follows with Dr. Myna Hidalgo.  Has been seen as inpatient once.  History of PE: On Eliquis PTA, currently on full dose heparin drip.  Hyperlipidemia: Continue atorvastatin.  COPD: Stable, not in acute exacerbation.  Continue home medications.  DVT prophylaxis: Heparin   Code Status: Full Code  Family Communication:  None present at bedside.  Plan of care discussed with patient in length and he/she verbalized understanding and agreed with it.  Status is: Inpatient Remains inpatient appropriate because: Cardiology plans to do cardiac cath on Monday.   Estimated body mass index is 29.96 kg/m as calculated from the following:   Height as of this encounter: 5\' 5"  (1.651 m).   Weight as of this encounter: 81.7 kg.    Nutritional Assessment: Body mass index is 29.96 kg/m.Marland Kitchen Seen  by dietician.  I agree with the assessment and plan as outlined below: Nutrition Status:        . Skin Assessment: I have examined the patient's skin and I agree with the wound assessment as performed by the wound care RN as outlined below:    Consultants:  Cardiology  Procedures:  None  Antimicrobials:  Anti-infectives (From admission, onward)    None         Subjective: Patient seen and examined.  He says that he has had 4 episodes in last 24 hours of similar symptoms with chest pain, jaw pain, mild dizziness with diaphoresis.  No complaints at the moment.  Objective: Vitals:   10/11/23 1048 10/11/23 1137 10/11/23 2147 10/12/23 0512  BP: (!) 121/90  111/75 115/78  Pulse:   87 65  Resp:  16 18 18   Temp:  98.1 F (36.7 C) 97.8 F (36.6 C) 98.1 F (36.7 C)  TempSrc:  Oral Oral Oral  SpO2:   96% 96%  Weight:      Height:       No intake or output data in the 24 hours ending 10/12/23 0811  Filed Weights   10/10/23 1158  Weight: 81.7 kg    Examination:  General exam: Appears calm and comfortable  Respiratory system: Clear to auscultation. Respiratory effort normal. Cardiovascular system: S1 & S2 heard, RRR. No JVD, murmurs, rubs, gallops or clicks. No pedal edema. Gastrointestinal system: Abdomen is nondistended, soft and nontender. No organomegaly or masses felt. Normal bowel sounds heard. Central nervous system: Alert and oriented. No focal neurological deficits.  Extremities: Symmetric 5 x 5 power. Skin: No rashes, lesions or ulcers.  Psychiatry: Judgement and insight appear normal. Mood & affect appropriate.    Data Reviewed: I have personally reviewed following labs and imaging studies  CBC: Recent Labs  Lab 10/10/23 0925 10/10/23 1134 10/11/23 0100 10/12/23 0500  WBC 7.6 7.5 7.9 8.1  NEUTROABS 6.0  --   --   --   HGB 14.4 14.6 14.8 14.3  HCT 42.0 42.1 42.3 40.9  MCV 87.3 86.3 86.5 86.3  PLT 207 224 230 213   Basic Metabolic  Panel: Recent Labs  Lab 10/10/23 0925 10/10/23 1134  NA 136 135  K 3.9 3.8  CL 101 99  CO2 28 28  GLUCOSE 95 105*  BUN 15 14  CREATININE 0.93 0.98  CALCIUM 9.0 8.9  MG  --  1.8   GFR: Estimated Creatinine Clearance: 82.9 mL/min (by C-G formula based on SCr of 0.98 mg/dL). Liver Function Tests: Recent Labs  Lab 10/10/23 0925  AST 19  ALT 34  ALKPHOS 52  BILITOT 0.5  PROT 6.9  ALBUMIN 4.2   No results for input(s): "LIPASE", "AMYLASE" in the last 168 hours. No results for input(s): "AMMONIA" in the last 168 hours. Coagulation Profile: Recent Labs  Lab 10/10/23 1134  INR 1.0   Cardiac Enzymes: No results for input(s): "CKTOTAL", "CKMB", "CKMBINDEX", "TROPONINI" in the last 168 hours. BNP (last 3 results) No results for input(s): "PROBNP" in the last 8760 hours. HbA1C: No results for input(s): "HGBA1C" in the last 72 hours. CBG: No results for input(s): "GLUCAP" in the last 168 hours. Lipid Profile: No results for input(s): "CHOL", "HDL", "LDLCALC", "TRIG", "CHOLHDL", "LDLDIRECT" in the last 72 hours. Thyroid Function Tests: Recent Labs    10/10/23 0925  TSH 1.821  T4TOTAL 6.7   Anemia Panel: No results for input(s): "VITAMINB12", "FOLATE", "FERRITIN", "TIBC", "IRON", "RETICCTPCT" in the last 72 hours. Sepsis Labs: No results for input(s): "PROCALCITON", "LATICACIDVEN" in the last 168 hours.  No results found for this or any previous visit (from the past 240 hours).   Radiology Studies: ECHOCARDIOGRAM COMPLETE Result Date: 10/11/2023    ECHOCARDIOGRAM REPORT   Patient Name:   Brandon Phillips. Date of Exam: 10/11/2023 Medical Rec #:  [id number]              Height:       65.0 in Accession #:    [id number]             Weight:       180.0 lb Date of Birth:  March 29, 1966             BSA:          1.892 m Patient Age:    57 years               BP:           121/90 mmHg Patient Gender: M                      HR:           73 bpm. Exam Location:  Inpatient  Procedure: 2D Echo, Cardiac Doppler and Color Doppler (Both Spectral and Color            Flow Doppler were utilized during procedure). Indications:    Chest Pain R07.9  History:        Patient has prior history of Echocardiogram examinations, most  recent 09/10/2022. Risk Factors:Hypertension, Diabetes and                 Dyslipidemia. Hx of lung cancer.  Sonographer:    Celesta Gentile RCS Referring Phys: [id number] SARA-MAIZ A THOMAS IMPRESSIONS  1. Left ventricular ejection fraction, by estimation, is 65 to 70%. The left ventricle has normal function. The left ventricle has no regional wall motion abnormalities. Left ventricular diastolic parameters are consistent with Grade I diastolic dysfunction (impaired relaxation).  2. Right ventricular systolic function is normal. The right ventricular size is normal. Tricuspid regurgitation signal is inadequate for assessing PA pressure.  3. No evidence of mitral valve regurgitation.  4. The aortic valve was not well visualized. Aortic valve regurgitation is not visualized.  5. The inferior vena cava is normal in size with greater than 50% respiratory variability, suggesting right atrial pressure of 3 mmHg. FINDINGS  Left Ventricle: Left ventricular ejection fraction, by estimation, is 65 to 70%. The left ventricle has normal function. The left ventricle has no regional wall motion abnormalities. The left ventricular internal cavity size was normal in size. There is  no left ventricular hypertrophy. Left ventricular diastolic parameters are consistent with Grade I diastolic dysfunction (impaired relaxation). Right Ventricle: The right ventricular size is normal. Right ventricular systolic function is normal. Tricuspid regurgitation signal is inadequate for assessing PA pressure. Left Atrium: Left atrial size was normal in size. Right Atrium: Right atrial size was normal in size. Pericardium: There is no evidence of pericardial effusion. Mitral Valve: No evidence  of mitral valve regurgitation. Tricuspid Valve: Tricuspid valve regurgitation is not demonstrated. Aortic Valve: The aortic valve was not well visualized. Aortic valve regurgitation is not visualized. Aortic valve mean gradient measures 11.0 mmHg. Aortic valve peak gradient measures 21.3 mmHg. Aortic valve area, by VTI measures 2.67 cm. Pulmonic Valve: The pulmonic valve was not well visualized. Pulmonic valve regurgitation is not visualized. Aorta: The aortic root and ascending aorta are structurally normal, with no evidence of dilitation. Venous: The inferior vena cava is normal in size with greater than 50% respiratory variability, suggesting right atrial pressure of 3 mmHg. IAS/Shunts: The interatrial septum was not well visualized.  LEFT VENTRICLE PLAX 2D LVIDd:         4.20 cm   Diastology LVIDs:         2.20 cm   LV e' medial:    9.79 cm/s LV PW:         0.90 cm   LV E/e' medial:  6.5 LV IVS:        0.90 cm   LV e' lateral:   14.30 cm/s LVOT diam:     1.80 cm   LV E/e' lateral: 4.5 LV SV:         80 LV SV Index:   42 LVOT Area:     2.54 cm  RIGHT VENTRICLE RV S prime:     19.00 cm/s TAPSE (M-mode): 2.0 cm LEFT ATRIUM             Index        RIGHT ATRIUM           Index LA diam:        2.80 cm 1.48 cm/m   RA Area:     11.00 cm LA Vol (A2C):   44.1 ml 23.31 ml/m  RA Volume:   21.40 ml  11.31 ml/m LA Vol (A4C):   28.0 ml 14.80 ml/m LA Biplane Vol: 36.6 ml 19.35 ml/m  AORTIC VALVE AV Area (Vmax):    2.24 cm AV Area (Vmean):   2.27 cm AV Area (VTI):     2.67 cm AV Vmax:           231.00 cm/s AV Vmean:          150.000 cm/s AV VTI:            0.299 m AV Peak Grad:      21.3 mmHg AV Mean Grad:      11.0 mmHg LVOT Vmax:         203.00 cm/s LVOT Vmean:        134.000 cm/s LVOT VTI:          0.314 m LVOT/AV VTI ratio: 1.05  AORTA Ao Root diam: 3.50 cm MITRAL VALVE MV Area (PHT): 2.50 cm     SHUNTS MV Decel Time: 303 msec     Systemic VTI:  0.31 m MV E velocity: 63.70 cm/s   Systemic Diam: 1.80 cm MV A  velocity: 119.00 cm/s MV E/A ratio:  0.54 Photographer signed by Carolan Clines Signature Date/Time: 10/11/2023/4:13:58 PM    Final    CT Angio Chest (PE) W or WO Contrast Result Date: 10/10/2023 CLINICAL DATA:  Pulmonary embolism (PE) suspected, high prob * Tracking Code: BO * EXAM: CT ANGIOGRAPHY CHEST WITH CONTRAST TECHNIQUE: Multidetector CT imaging of the chest was performed using the standard protocol during bolus administration of intravenous contrast. Multiplanar CT image reconstructions and MIPs were obtained to evaluate the vascular anatomy. RADIATION DOSE REDUCTION: This exam was performed according to the departmental dose-optimization program which includes automated exposure control, adjustment of the mA and/or kV according to patient size and/or use of iterative reconstruction technique. CONTRAST:  75mL OMNIPAQUE IOHEXOL 350 MG/ML SOLN COMPARISON:  CT angiography chest from 04/23/2023. FINDINGS: Cardiovascular: No evidence of embolism to the proximal subsegmental pulmonary artery level. Normal cardiac size. No pericardial effusion. No aortic aneurysm. There are coronary artery calcifications, in keeping with coronary artery disease. There are also mild peripheral atherosclerotic vascular calcifications of thoracic aorta and its major branches. Mediastinum/Nodes: Visualized thyroid gland appears grossly unremarkable. No solid / cystic mediastinal masses. The esophagus is nondistended precluding optimal assessment. No axillary, mediastinal or hilar lymphadenopathy by size criteria. Lungs/Pleura: The central tracheo-bronchial tree is patent. Moderate to severe upper lobe predominant centrilobular emphysema noted. There is focal scarring and bandlike opacity in the left hilar region with extension into the left upper lobe, overall improved since the prior study, suggesting favorable treatment response. There is also interval decrease in the nodule in the left lung lower lobe currently  measuring 1.0 x 1.5 cm, previously 1.6 x 2.0 cm. No new mass or consolidation. No pleural effusion or pneumothorax. No suspicious lung nodules. Upper Abdomen: Visualized upper abdominal viscera within normal limits. Musculoskeletal: A CT Port-a-Cath is seen in the right upper chest wall with the catheter terminating in the cavo-atrial junction region. Visualized soft tissues of the chest wall are otherwise grossly unremarkable. No suspicious osseous lesions. There are mild multilevel degenerative changes in the visualized spine. Old healed right first rib fracture noted. Review of the MIP images confirms the above findings. IMPRESSION: 1. No embolism to the proximal subsegmental pulmonary artery level. 2. Interval decrease in the size of left hilar/upper lobe mass and left lower lobe nodule, compatible with favorable treatment response. No new lung mass or consolidation. No lymphadenopathy by size criteria. 3. Multiple other nonacute observations, as described above. Aortic Atherosclerosis (ICD10-I70.0)  and Emphysema (ICD10-J43.9). NO Electronically Signed   By: Jules Schick M.D.   On: 10/10/2023 15:38   DG Chest Port 1 View Result Date: 10/10/2023 CLINICAL DATA:  Chest pain, lung cancer EXAM: PORTABLE CHEST 1 VIEW COMPARISON:  02/13/2023 FINDINGS: Right chest port remains in place. Normal heart size. Scarring or atelectasis in the left perihilar region. Elsewhere, no focal consolidation. No pleural effusion or pneumothorax. IMPRESSION: Scarring or atelectasis in the left perihilar region. No acute cardiopulmonary findings. Electronically Signed   By: Duanne Guess D.O.   On: 10/10/2023 14:19    Scheduled Meds:  amLODipine  2.5 mg Oral Daily   atorvastatin  40 mg Oral Daily   Chlorhexidine Gluconate Cloth  6 each Topical Daily   fluticasone furoate-vilanterol  1 puff Inhalation Daily   And   umeclidinium bromide  1 puff Inhalation Daily   folic acid  1 mg Oral Daily   isosorbide mononitrate  30 mg  Oral Daily   Continuous Infusions:  heparin 1,400 Units/hr (10/12/23 0803)     LOS: 1 day   Hughie Closs, MD [facility]  10/12/2023, 8:11 AM   *Please note that this is a verbal dictation therefore any spelling or grammatical errors are due to the "Dragon [employer] One" system interpretation.  Please page via Amion and do not message via secure chat for urgent patient care matters. Secure chat can be used for non urgent patient care matters.  How to contact the [facility] Attending or Consulting provider 7A - 7P or covering provider during after hours 7P -7A, for this patient?  Check the care team in [facility] and look for a) attending/consulting [facility] provider listed and b) the [facility] team listed. Page or secure chat 7A-7P. Log into [email or web address] and use [facility]'s universal password to access. If you do not have the password, please contact the hospital operator. Locate the [facility] provider you are looking for under [facility] and page to a number that you can be directly reached. If you still have difficulty reaching the provider, please page the [facility] (Director on Call) for the Hospitalists listed on amion for assistance.

## 2023-10-12 NOTE — Progress Notes (Signed)
 Pt reported episode of jaw pain around 0435am. Lasted 2-3 mins. Tele showing ST elevation on review. Pt rhythm went back to normal. VS normal. Dr on call made aware. EKG was done.

## 2023-10-13 ENCOUNTER — Telehealth (HOSPITAL_COMMUNITY): Payer: Self-pay | Admitting: Pharmacy Technician

## 2023-10-13 ENCOUNTER — Other Ambulatory Visit (HOSPITAL_COMMUNITY): Payer: Self-pay

## 2023-10-13 ENCOUNTER — Encounter (HOSPITAL_COMMUNITY): Payer: Self-pay | Admitting: Cardiology

## 2023-10-13 ENCOUNTER — Encounter (HOSPITAL_COMMUNITY)
Admission: EM | Disposition: A | Discharge status: Home / Self Care | Payer: Self-pay | Source: Home / Self Care | Attending: Family Medicine

## 2023-10-13 ENCOUNTER — Other Ambulatory Visit: Payer: Self-pay

## 2023-10-13 DIAGNOSIS — C3490 Malignant neoplasm of unspecified part of unspecified bronchus or lung: Secondary | ICD-10-CM

## 2023-10-13 DIAGNOSIS — I2510 Atherosclerotic heart disease of native coronary artery without angina pectoris: Secondary | ICD-10-CM | POA: Diagnosis not present

## 2023-10-13 DIAGNOSIS — I472 Ventricular tachycardia, unspecified: Secondary | ICD-10-CM | POA: Diagnosis not present

## 2023-10-13 DIAGNOSIS — I200 Unstable angina: Secondary | ICD-10-CM | POA: Diagnosis not present

## 2023-10-13 HISTORY — PX: LEFT HEART CATH AND CORONARY ANGIOGRAPHY: CATH118249

## 2023-10-13 HISTORY — PX: CORONARY PRESSURE/FFR WITH 3D MAPPING: CATH118309

## 2023-10-13 LAB — CBC WITH DIFFERENTIAL/PLATELET
Abs Immature Granulocytes: 0.05 10*3/uL (ref 0.00–0.07)
Basophils Absolute: 0.1 10*3/uL (ref 0.0–0.1)
Basophils Relative: 1 %
Eosinophils Absolute: 0.2 10*3/uL (ref 0.0–0.5)
Eosinophils Relative: 3 %
HCT: 40.9 % (ref 39.0–52.0)
Hemoglobin: 14.3 g/dL (ref 13.0–17.0)
Immature Granulocytes: 1 %
Lymphocytes Relative: 10 %
Lymphs Abs: 0.7 10*3/uL (ref 0.7–4.0)
MCH: 29.9 pg (ref 26.0–34.0)
MCHC: 35 g/dL (ref 30.0–36.0)
MCV: 85.6 fL (ref 80.0–100.0)
Monocytes Absolute: 0.7 10*3/uL (ref 0.1–1.0)
Monocytes Relative: 9 %
Neutro Abs: 6 10*3/uL (ref 1.7–7.7)
Neutrophils Relative %: 76 %
Platelets: 243 10*3/uL (ref 150–400)
RBC: 4.78 MIL/uL (ref 4.22–5.81)
RDW: 13.7 % (ref 11.5–15.5)
WBC: 7.8 10*3/uL (ref 4.0–10.5)
nRBC: 0 % (ref 0.0–0.2)

## 2023-10-13 LAB — LIPID PANEL
Cholesterol: 185 mg/dL (ref 0–200)
HDL: 36 mg/dL — ABNORMAL LOW (ref >40)
LDL Cholesterol: 70 mg/dL (ref 0–99)
Total CHOL/HDL Ratio: 5.1 ratio
Triglycerides: 396 mg/dL — ABNORMAL HIGH (ref <150)
VLDL: 79 mg/dL — ABNORMAL HIGH (ref 0–40)

## 2023-10-13 LAB — COMPREHENSIVE METABOLIC PANEL
ALT: 42 U/L (ref 0–44)
AST: 29 U/L (ref 15–41)
Albumin: 3.3 g/dL — ABNORMAL LOW (ref 3.5–5.0)
Alkaline Phosphatase: 44 U/L (ref 38–126)
Anion gap: 10 (ref 5–15)
BUN: 9 mg/dL (ref 6–20)
CO2: 22 mmol/L (ref 22–32)
Calcium: 8.6 mg/dL — ABNORMAL LOW (ref 8.9–10.3)
Chloride: 101 mmol/L (ref 98–111)
Creatinine, Ser: 1.13 mg/dL (ref 0.61–1.24)
GFR, Estimated: >60 mL/min (ref >60)
Glucose, Bld: 107 mg/dL — ABNORMAL HIGH (ref 70–99)
Potassium: 3.8 mmol/L (ref 3.5–5.1)
Sodium: 133 mmol/L — ABNORMAL LOW (ref 135–145)
Total Bilirubin: 0.4 mg/dL (ref 0.0–1.2)
Total Protein: 6.5 g/dL (ref 6.5–8.1)

## 2023-10-13 LAB — TROPONIN I (HIGH SENSITIVITY): Troponin I (High Sensitivity): 5 ng/L (ref <18)

## 2023-10-13 LAB — HEPARIN LEVEL (UNFRACTIONATED): Heparin Unfractionated: 0.61 [IU]/mL (ref 0.30–0.70)

## 2023-10-13 SURGERY — LEFT HEART CATH AND CORONARY ANGIOGRAPHY
Anesthesia: LOCAL

## 2023-10-13 MED ORDER — FENTANYL CITRATE (PF) 100 MCG/2ML IJ SOLN
INTRAMUSCULAR | Status: DC | PRN
  Administered 2023-10-13: 25 ug via INTRAVENOUS

## 2023-10-13 MED ORDER — MIDAZOLAM HCL 2 MG/2ML IJ SOLN
INTRAMUSCULAR | Status: DC | PRN
  Administered 2023-10-13: 2 mg via INTRAVENOUS

## 2023-10-13 MED ORDER — SODIUM CHLORIDE 0.9 % IV SOLN: 250.0000 mL | INTRAVENOUS | Status: DC | PRN

## 2023-10-13 MED ORDER — HEPARIN SODIUM (PORCINE) 1000 UNIT/ML IJ SOLN
INTRAMUSCULAR | Status: AC
  Filled 2023-10-13: qty 10

## 2023-10-13 MED ORDER — SODIUM CHLORIDE 0.9% FLUSH: 3.0000 mL | INTRAVENOUS | Status: DC | PRN

## 2023-10-13 MED ORDER — VERAPAMIL HCL 2.5 MG/ML IV SOLN
INTRAVENOUS | Status: DC | PRN
  Administered 2023-10-13: 10 mL via INTRA_ARTERIAL

## 2023-10-13 MED ORDER — IOHEXOL 350 MG/ML SOLN
INTRAVENOUS | Status: DC | PRN
  Administered 2023-10-13: 95 mL via INTRA_ARTERIAL

## 2023-10-13 MED ORDER — HEPARIN (PORCINE) IN NACL 1000-0.9 UT/500ML-% IV SOLN
INTRAVENOUS | Status: DC | PRN
  Administered 2023-10-13 (×2): 500 mL

## 2023-10-13 MED ORDER — NITROGLYCERIN 1 MG/10 ML FOR IR/CATH LAB
INTRA_ARTERIAL | Status: DC | PRN
  Administered 2023-10-13: 200 ug via INTRACORONARY

## 2023-10-13 MED ORDER — AMLODIPINE BESYLATE 5 MG PO TABS
5.0000 mg | ORAL_TABLET | Freq: Every day | ORAL | Status: DC
  Administered 2023-10-14: 5 mg via ORAL
  Filled 2023-10-13: qty 1

## 2023-10-13 MED ORDER — ASPIRIN 81 MG PO TBEC
81.0000 mg | DELAYED_RELEASE_TABLET | Freq: Every day | ORAL | Status: DC
  Administered 2023-10-14: 81 mg via ORAL
  Filled 2023-10-13: qty 1

## 2023-10-13 MED ORDER — VERAPAMIL HCL 2.5 MG/ML IV SOLN
INTRAVENOUS | Status: AC
  Filled 2023-10-13: qty 2

## 2023-10-13 MED ORDER — MIDAZOLAM HCL 2 MG/2ML IJ SOLN
INTRAMUSCULAR | Status: AC
  Filled 2023-10-13: qty 2

## 2023-10-13 MED ORDER — NITROGLYCERIN 1 MG/10 ML FOR IR/CATH LAB
INTRA_ARTERIAL | Status: AC
  Filled 2023-10-13: qty 10

## 2023-10-13 MED ORDER — APIXABAN 2.5 MG PO TABS
2.5000 mg | ORAL_TABLET | Freq: Two times a day (BID) | ORAL | Status: DC
  Administered 2023-10-13 – 2023-10-14 (×2): 2.5 mg via ORAL
  Filled 2023-10-13 (×2): qty 1

## 2023-10-13 MED ORDER — LIDOCAINE HCL (PF) 1 % IJ SOLN
INTRAMUSCULAR | Status: DC | PRN
  Administered 2023-10-13: 2 mL

## 2023-10-13 MED ORDER — FENTANYL CITRATE (PF) 100 MCG/2ML IJ SOLN
INTRAMUSCULAR | Status: AC
Start: 2023-10-13 — End: ?
  Filled 2023-10-13: qty 2

## 2023-10-13 MED ORDER — HEPARIN SODIUM (PORCINE) 1000 UNIT/ML IJ SOLN
INTRAMUSCULAR | Status: DC | PRN
  Administered 2023-10-13: 4000 [IU] via INTRAVENOUS

## 2023-10-13 MED ORDER — LIDOCAINE HCL (PF) 1 % IJ SOLN
INTRAMUSCULAR | Status: AC
  Filled 2023-10-13: qty 30

## 2023-10-13 SURGICAL SUPPLY — 9 items
CARD KEY FFR CATHWORX (MISCELLANEOUS) IMPLANT
CATH INFINITI AMBI 5FR TG (CATHETERS) IMPLANT
DEVICE RAD COMP TR BAND LRG (VASCULAR PRODUCTS) IMPLANT
FFR CATHWORX KEY CARD (MISCELLANEOUS) ×1 IMPLANT
GLIDESHEATH SLEND A-KIT 6F 22G (SHEATH) IMPLANT
KIT SINGLE USE MANIFOLD (KITS) IMPLANT
PACK CARDIAC CATHETERIZATION (CUSTOM PROCEDURE TRAY) ×1 IMPLANT
SET ATX-X65L (MISCELLANEOUS) IMPLANT
WIRE EMERALD 3MM-J .035X260CM (WIRE) IMPLANT

## 2023-10-13 NOTE — Progress Notes (Addendum)
 Transient ST elevations on telemetry    Patient with chest pain and transient episode of ST elevations noted on telemetry.  Please see coronary angio, moderate disease. Increase Amlodipine to 10 and continue Imdur 60. ASA for a month until he settles down and continue Eliquis for hypercoagulable state and DVT in past  Yates Decamp, MD, [facility] 10/13/2023, 10:45 AM [facility] [address] [place], [place] [address] Phone: [phone]. Fax:  [phone]

## 2023-10-13 NOTE — Progress Notes (Addendum)
 Rounding Note    Patient Name: Brandon Phillips. Date of Encounter: 10/13/2023  [facility] Cardiologist: None   Subjective   Patient continue to have intermittent episodes of jaw pain that correlate with transient ST elevation on telemetry. He has had 4 episode this morning. Currently pain free. No shortness of breath. Plan is for [facility] later today.  Inpatient Medications    Scheduled Meds:  amLODipine  2.5 mg Oral Daily   atorvastatin  40 mg Oral Daily   Chlorhexidine Gluconate Cloth  6 each Topical Daily   fluticasone furoate-vilanterol  1 puff Inhalation Daily   And   umeclidinium bromide  1 puff Inhalation Daily   folic acid  1 mg Oral Daily   isosorbide mononitrate  60 mg Oral Daily   Continuous Infusions:  sodium chloride 1 mL/kg/hr (10/13/23 0522)   heparin 1,400 Units/hr (10/13/23 0448)   PRN Meds: acetaminophen, nitroGLYCERIN, ondansetron (ZOFRAN) IV   Vital Signs    Vitals:   10/12/23 1541 10/12/23 1920 10/13/23 0413 10/13/23 0801  BP: 108/74 110/75 (!) 116/99 117/83  Pulse: 81 89 76   Resp: 18 18 20    Temp: 98.1 F (36.7 C) 98.1 F (36.7 C) 98.2 F (36.8 C)   TempSrc: Oral Oral Oral   SpO2: 95% 97% 94%   Weight:      Height:        Intake/Output Summary (Last 24 hours) at 10/13/2023 0816 Last data filed at 10/13/2023 0448 Gross per 24 hour  Intake 609.23 ml  Output --  Net 609.23 ml      10/10/2023   11:58 AM 10/10/2023    9:35 AM 09/24/2023    4:18 PM  Last 3 Weights  Weight (lbs) 180 lb 0.7 oz 180 lb 0.6 oz 183 lb  Weight (kg) 81.666 kg 81.666 kg 83.008 kg      Telemetry    Normal sinus rhythm with rates in the 60s to 80s. - Personally Reviewed  ECG    Normal sinus rhythm with no acute ischemic changes. - Personally Reviewed  Physical Exam   GEN: No acute distress.   Neck: No JVD. Cardiac: RRR. No murmurs, rubs, or gallops. Right radial pulse 2+. Respiratory: Clear to auscultation bilaterally. No wheezes, rhonchi,  or rales. MS: No lower extremity edema. No deformity. Neuro:  No focal deficits. Psych: Normal affect. Responds appropriately.  Labs    High Sensitivity Troponin:   Recent Labs  Lab 10/10/23 1134 10/10/23 1347  TROPONINIHS 5 5     Chemistry Recent Labs  Lab 10/10/23 0925 10/10/23 1134 10/13/23 0430  NA 136 135 133*  K 3.9 3.8 3.8  CL 101 99 101  CO2 28 28 22   GLUCOSE 95 105* 107*  BUN 15 14 9   CREATININE 0.93 0.98 1.13  CALCIUM 9.0 8.9 8.6*  MG  --  1.8  --   PROT 6.9  --  6.5  ALBUMIN 4.2  --  3.3*  AST 19  --  29  ALT 34  --  42  ALKPHOS 52  --  44  BILITOT 0.5  --  0.4  GFRNONAA >60 >60 >60  ANIONGAP 7 8 10     Lipids  Recent Labs  Lab 10/13/23 0430  CHOL 185  TRIG 396*  HDL 36*  LDLCALC 70  CHOLHDL 5.1    Hematology Recent Labs  Lab 10/11/23 0100 10/12/23 0500 10/13/23 0430  WBC 7.9 8.1 7.8  RBC 4.89 4.74 4.78  HGB  14.8 14.3 14.3  HCT 42.3 40.9 40.9  MCV 86.5 86.3 85.6  MCH 30.3 30.2 29.9  MCHC 35.0 35.0 35.0  RDW 13.9 13.7 13.7  PLT 230 213 243   Thyroid  Recent Labs  Lab 10/10/23 0925  TSH 1.821    BNPNo results for input(s): "BNP", "PROBNP" in the last 168 hours.  DDimer No results for input(s): "DDIMER" in the last 168 hours.   Radiology    ECHOCARDIOGRAM COMPLETE Result Date: 10/11/2023    ECHOCARDIOGRAM REPORT   Patient Name:   Brandon Phillips. Date of Exam: 10/11/2023 Medical Rec #:  [id number]              Height:       65.0 in Accession #:    [id number]             Weight:       180.0 lb Date of Birth:  10/21/1965             BSA:          1.892 m Patient Age:    57 years               BP:           121/90 mmHg Patient Gender: M                      HR:           73 bpm. Exam Location:  Inpatient Procedure: 2D Echo, Cardiac Doppler and Color Doppler (Both Spectral and Color            Flow Doppler were utilized during procedure). Indications:    Chest Pain R07.9  History:        Patient has prior history of Echocardiogram  examinations, most                 recent 09/10/2022. Risk Factors:Hypertension, Diabetes and                 Dyslipidemia. Hx of lung cancer.  Sonographer:    Celesta Gentile RCS Referring Phys: [id number] SARA-MAIZ A THOMAS IMPRESSIONS  1. Left ventricular ejection fraction, by estimation, is 65 to 70%. The left ventricle has normal function. The left ventricle has no regional wall motion abnormalities. Left ventricular diastolic parameters are consistent with Grade I diastolic dysfunction (impaired relaxation).  2. Right ventricular systolic function is normal. The right ventricular size is normal. Tricuspid regurgitation signal is inadequate for assessing PA pressure.  3. No evidence of mitral valve regurgitation.  4. The aortic valve was not well visualized. Aortic valve regurgitation is not visualized.  5. The inferior vena cava is normal in size with greater than 50% respiratory variability, suggesting right atrial pressure of 3 mmHg. FINDINGS  Left Ventricle: Left ventricular ejection fraction, by estimation, is 65 to 70%. The left ventricle has normal function. The left ventricle has no regional wall motion abnormalities. The left ventricular internal cavity size was normal in size. There is  no left ventricular hypertrophy. Left ventricular diastolic parameters are consistent with Grade I diastolic dysfunction (impaired relaxation). Right Ventricle: The right ventricular size is normal. Right ventricular systolic function is normal. Tricuspid regurgitation signal is inadequate for assessing PA pressure. Left Atrium: Left atrial size was normal in size. Right Atrium: Right atrial size was normal in size. Pericardium: There is no evidence of pericardial effusion. Mitral Valve: No evidence of mitral valve regurgitation. Tricuspid Valve: Tricuspid  valve regurgitation is not demonstrated. Aortic Valve: The aortic valve was not well visualized. Aortic valve regurgitation is not visualized. Aortic valve mean gradient  measures 11.0 mmHg. Aortic valve peak gradient measures 21.3 mmHg. Aortic valve area, by VTI measures 2.67 cm. Pulmonic Valve: The pulmonic valve was not well visualized. Pulmonic valve regurgitation is not visualized. Aorta: The aortic root and ascending aorta are structurally normal, with no evidence of dilitation. Venous: The inferior vena cava is normal in size with greater than 50% respiratory variability, suggesting right atrial pressure of 3 mmHg. IAS/Shunts: The interatrial septum was not well visualized.  LEFT VENTRICLE PLAX 2D LVIDd:         4.20 cm   Diastology LVIDs:         2.20 cm   LV e' medial:    9.79 cm/s LV PW:         0.90 cm   LV E/e' medial:  6.5 LV IVS:        0.90 cm   LV e' lateral:   14.30 cm/s LVOT diam:     1.80 cm   LV E/e' lateral: 4.5 LV SV:         80 LV SV Index:   42 LVOT Area:     2.54 cm  RIGHT VENTRICLE RV S prime:     19.00 cm/s TAPSE (M-mode): 2.0 cm LEFT ATRIUM             Index        RIGHT ATRIUM           Index LA diam:        2.80 cm 1.48 cm/m   RA Area:     11.00 cm LA Vol (A2C):   44.1 ml 23.31 ml/m  RA Volume:   21.40 ml  11.31 ml/m LA Vol (A4C):   28.0 ml 14.80 ml/m LA Biplane Vol: 36.6 ml 19.35 ml/m  AORTIC VALVE AV Area (Vmax):    2.24 cm AV Area (Vmean):   2.27 cm AV Area (VTI):     2.67 cm AV Vmax:           231.00 cm/s AV Vmean:          150.000 cm/s AV VTI:            0.299 m AV Peak Grad:      21.3 mmHg AV Mean Grad:      11.0 mmHg LVOT Vmax:         203.00 cm/s LVOT Vmean:        134.000 cm/s LVOT VTI:          0.314 m LVOT/AV VTI ratio: 1.05  AORTA Ao Root diam: 3.50 cm MITRAL VALVE MV Area (PHT): 2.50 cm     SHUNTS MV Decel Time: 303 msec     Systemic VTI:  0.31 m MV E velocity: 63.70 cm/s   Systemic Diam: 1.80 cm MV A velocity: 119.00 cm/s MV E/A ratio:  0.54 [place] signed by Carolan Clines Signature Date/Time: 10/11/2023/4:13:58 PM    Final     Cardiac Studies   Echocardiogram 10/11/2023: Impressions: 1. Left ventricular  ejection fraction, by estimation, is 65 to 70%. The  left ventricle has normal function. The left ventricle has no regional  wall motion abnormalities. Left ventricular diastolic parameters are  consistent with Grade I diastolic  dysfunction (impaired relaxation).   2. Right ventricular systolic function is normal. The right ventricular  size is normal. Tricuspid  regurgitation signal is inadequate for assessing  PA pressure.   3. No evidence of mitral valve regurgitation.   4. The aortic valve was not well visualized. Aortic valve regurgitation  is not visualized.   5. The inferior vena cava is normal in size with greater than 50%  respiratory variability, suggesting right atrial pressure of 3 mmHg.    Patient Profile     58 y.o. 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  Assessment & Plan    Chest/ Jaw Pain Possible Coronary Vasospams Patient presented with intermittent chest/ jaw pain that has been going on for the last week but occurring more frequently. EKG showed no acute ischemic changes and high-sensitivity troponin negative x2. Echo showed normal LV function and no regional wall motion abnormalities. Episodes of jaw/ chest pain during admission have correlated with transient ST elevation noted on telemetry raising concern for coronary spasms. - Currently pain free.  However, patient has had 4 episodes of  jaw pain this morning that have last a couple of minutes at a time. These episodes continue to correlated with transient ST elevation on telemetry. EKG this morning shows no acute ischemic changes.  - Will repeat a high-sensitivity troponin this morning. - Continue Amlodipine 2.5mg  daily and Imdur 60mg  daily. He just received morning medications. If he has any recurrent jaw  pain this morning, can start IV Nitroglycerin. Of note, he does reports getting a headache with Imdur. Will give Tylenol for this now. May ultimately have to stop Imdur and increase Amlodipine. - Continue statin.  - Plan is for [facility] today. The patient understands that risks include but are not limited to stroke (1 in 1000), death (1 in 1000), kidney failure [usually temporary] (1 in 500), bleeding (1 in 200), allergic reaction [possibly serious] (1 in 200), and agrees to proceed.   Non-Sustained VT Patient had one documented episode of NSVT on Kardia mobile device. No recurrence noted on telemetry this admission. - No recurrence. - He was initially started on Lopressor but this was stopped to focus on treatment of vasospasms with nitrates and calcium channel blockers. - Continue to monitor on telemetry.  Hyperlipidemia Lipid panel this admission: Total Cholesterol 185, Triglycerides 396, HDL 36, LDL 70. - Home Lipitor has been increased from 20mg  to 40mg  daily.  - Consider adding Vascepa to help with triglycerides. - Will need repeat lipid panel and LFTs in 6-8 weeks.  History of PE - On chronic anticoagulation with Eliquis at home. However, this is currently on hold and patient is on IV Heparin in anticipation for cardiac catheterization.  Otherwise, per primary team: - COPD - Non-small cell lung cancer    For questions or updates, please contact [facility] Please consult [email or web address] for contact info under        Signed, Corrin Parker, [name]  10/13/2023, 8:16 AM   I have seen and examined the patient along with Corrin Parker, [name] .  I have reviewed the chart, notes and new data.  I agree with PA/NP's note.  Key new complaints: he describes chest discomfort, associated with diaphoresis and pallor, occurred 4 x yesterday and 4 times this AM, each time brief and resolved spontaneously. First event a week ago. One episode at home associated with NSVT recorded on his  Kardia monitor. Seems to  be more common in the morning. Key examination changes:  normal CV exam Key new findings / data: 1-2 mm ST elevation on telemetry leads this AM around 7 o'clock, no ventricular arrhythmia. Echo with normal LV and RV function and wall motion. ECG - NSR, normal troponin. Coronary artery and aortic atherosclerotic calcifications noted on CT.  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 Triggers are unclear. He has ben on current immunotherapy for almost a year and symptoms started more recently. Discussed the fact that coronary vasospasm most commonly occurs on top of a preexisting coronary stenosis, less commonly on a truly normal vessel. Just received LA nitrate and amlodipine this AM. If symptoms recur, start IV NTG. Continue IV heparin. For cardiac cath +/- PCI around 1 PM today. Aggressively treat all lipid risk factors. This procedure has been fully reviewed with the patient and written informed consent has been obtained.  Thurmon Fair, MD, [facility] [phone] office [phone] pager

## 2023-10-13 NOTE — Progress Notes (Signed)
 PHARMACY - ANTICOAGULATION CONSULT NOTE  Pharmacy Consult for heparin Indication: chest pain/ACS, on Eliquis PTA for hx of PE   No Known Allergies  Patient Measurements: Height: 5\' 5"  (165.1 cm) Weight: 81.7 kg (180 lb 0.7 oz) IBW/kg (Calculated) : 61.5 Heparin Dosing Weight: 78.3g   Vital Signs: Temp: 98.2 F (36.8 C) (03/17 0413) Temp Source: Oral (03/17 0413) BP: 116/99 (03/17 0413) Pulse Rate: 76 (03/17 0413)  Labs: Recent Labs    10/10/23 0925 10/10/23 0925 10/10/23 1134 10/10/23 1347 10/11/23 0100 10/11/23 0456 10/11/23 1054 10/11/23 2100 10/11/23 2100 10/12/23 0500 10/12/23 1603 10/13/23 0430  HGB 14.4   < > 14.6  --  14.8  --   --   --   --  14.3  --  14.3  HCT 42.0  --  42.1  --  42.3  --   --   --   --  40.9  --  40.9  PLT 207   < > 224  --  230  --   --   --   --  213  --  243  APTT  --   --   --   --  41*   < > 50* 79*  --  99*  --   --   LABPROT  --   --  13.2  --   --   --   --   --   --   --   --   --   INR  --   --  1.0  --   --   --   --   --   --   --   --   --   HEPARINUNFRC  --   --   --   --   --   --   --  0.63   < > 0.75* 0.65 0.61  CREATININE 0.93  --  0.98  --   --   --   --   --   --   --   --  1.13  TROPONINIHS  --   --  5 5  --   --   --   --   --   --   --   --    < > = values in this interval not displayed.    Estimated Creatinine Clearance: 71.9 mL/min (by C-G formula based on SCr of 1.13 mg/dL).   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  Trop resulted at 5. Has been having runs of Vtach seen on EKG in ED. On Eliquis PTA for hx of PE. Last dose of  Eliquis 3/14 AM around 07:00.   Heparin level 0.61, therapeutic, on 1400 units/hr.  No signs of bleeding or issues with infusion per RN.   Goal of Therapy:  Heparin level 0.3-0.7 units/ml Monitor platelets by anticoagulation protocol: Yes   Plan:  Reduce heparin to 1400 units/hr  6 hour heparin level Daily HL, CBC Monitor for s/sx of bleeding F/u after cath  Thank you for involving pharmacy in the  patient's care.   Trixie Rude, PharmD Clinical Pharmacist 10/13/2023  6:57 AM

## 2023-10-13 NOTE — H&P (View-Only) (Signed)
 Rounding Note    Patient Name: Brandon Phillips. Date of Encounter: 10/13/2023  [facility] Cardiologist: None   Subjective   Patient continue to have intermittent episodes of jaw pain that correlate with transient ST elevation on telemetry. He has had 4 episode this morning. Currently pain free. No shortness of breath. Plan is for [facility] later today.  Inpatient Medications    Scheduled Meds:  amLODipine  2.5 mg Oral Daily   atorvastatin  40 mg Oral Daily   Chlorhexidine Gluconate Cloth  6 each Topical Daily   fluticasone furoate-vilanterol  1 puff Inhalation Daily   And   umeclidinium bromide  1 puff Inhalation Daily   folic acid  1 mg Oral Daily   isosorbide mononitrate  60 mg Oral Daily   Continuous Infusions:  sodium chloride 1 mL/kg/hr (10/13/23 0522)   heparin 1,400 Units/hr (10/13/23 0448)   PRN Meds: acetaminophen, nitroGLYCERIN, ondansetron (ZOFRAN) IV   Vital Signs    Vitals:   10/12/23 1541 10/12/23 1920 10/13/23 0413 10/13/23 0801  BP: 108/74 110/75 (!) 116/99 117/83  Pulse: 81 89 76   Resp: 18 18 20    Temp: 98.1 F (36.7 C) 98.1 F (36.7 C) 98.2 F (36.8 C)   TempSrc: Oral Oral Oral   SpO2: 95% 97% 94%   Weight:      Height:        Intake/Output Summary (Last 24 hours) at 10/13/2023 0816 Last data filed at 10/13/2023 0448 Gross per 24 hour  Intake 609.23 ml  Output --  Net 609.23 ml      10/10/2023   11:58 AM 10/10/2023    9:35 AM 09/24/2023    4:18 PM  Last 3 Weights  Weight (lbs) 180 lb 0.7 oz 180 lb 0.6 oz 183 lb  Weight (kg) 81.666 kg 81.666 kg 83.008 kg      Telemetry    Normal sinus rhythm with rates in the 60s to 80s. - Personally Reviewed  ECG    Normal sinus rhythm with no acute ischemic changes. - Personally Reviewed  Physical Exam   GEN: No acute distress.   Neck: No JVD. Cardiac: RRR. No murmurs, rubs, or gallops. Right radial pulse 2+. Respiratory: Clear to auscultation bilaterally. No wheezes, rhonchi,  or rales. MS: No lower extremity edema. No deformity. Neuro:  No focal deficits. Psych: Normal affect. Responds appropriately.  Labs    High Sensitivity Troponin:   Recent Labs  Lab 10/10/23 1134 10/10/23 1347  TROPONINIHS 5 5     Chemistry Recent Labs  Lab 10/10/23 0925 10/10/23 1134 10/13/23 0430  NA 136 135 133*  K 3.9 3.8 3.8  CL 101 99 101  CO2 28 28 22   GLUCOSE 95 105* 107*  BUN 15 14 9   CREATININE 0.93 0.98 1.13  CALCIUM 9.0 8.9 8.6*  MG  --  1.8  --   PROT 6.9  --  6.5  ALBUMIN 4.2  --  3.3*  AST 19  --  29  ALT 34  --  42  ALKPHOS 52  --  44  BILITOT 0.5  --  0.4  GFRNONAA >60 >60 >60  ANIONGAP 7 8 10     Lipids  Recent Labs  Lab 10/13/23 0430  CHOL 185  TRIG 396*  HDL 36*  LDLCALC 70  CHOLHDL 5.1    Hematology Recent Labs  Lab 10/11/23 0100 10/12/23 0500 10/13/23 0430  WBC 7.9 8.1 7.8  RBC 4.89 4.74 4.78  HGB  14.8 14.3 14.3  HCT 42.3 40.9 40.9  MCV 86.5 86.3 85.6  MCH 30.3 30.2 29.9  MCHC 35.0 35.0 35.0  RDW 13.9 13.7 13.7  PLT 230 213 243   Thyroid  Recent Labs  Lab 10/10/23 0925  TSH 1.821    BNPNo results for input(s): "BNP", "PROBNP" in the last 168 hours.  DDimer No results for input(s): "DDIMER" in the last 168 hours.   Radiology    ECHOCARDIOGRAM COMPLETE Result Date: 10/11/2023    ECHOCARDIOGRAM REPORT   Patient Name:   Brandon Phillips. Date of Exam: 10/11/2023 Medical Rec #:  [id number]              Height:       65.0 in Accession #:    [id number]             Weight:       180.0 lb Date of Birth:  10/21/1965             BSA:          1.892 m Patient Age:    57 years               BP:           121/90 mmHg Patient Gender: M                      HR:           73 bpm. Exam Location:  Inpatient Procedure: 2D Echo, Cardiac Doppler and Color Doppler (Both Spectral and Color            Flow Doppler were utilized during procedure). Indications:    Chest Pain R07.9  History:        Patient has prior history of Echocardiogram  examinations, most                 recent 09/10/2022. Risk Factors:Hypertension, Diabetes and                 Dyslipidemia. Hx of lung cancer.  Sonographer:    Celesta Gentile RCS Referring Phys: [id number] SARA-MAIZ A THOMAS IMPRESSIONS  1. Left ventricular ejection fraction, by estimation, is 65 to 70%. The left ventricle has normal function. The left ventricle has no regional wall motion abnormalities. Left ventricular diastolic parameters are consistent with Grade I diastolic dysfunction (impaired relaxation).  2. Right ventricular systolic function is normal. The right ventricular size is normal. Tricuspid regurgitation signal is inadequate for assessing PA pressure.  3. No evidence of mitral valve regurgitation.  4. The aortic valve was not well visualized. Aortic valve regurgitation is not visualized.  5. The inferior vena cava is normal in size with greater than 50% respiratory variability, suggesting right atrial pressure of 3 mmHg. FINDINGS  Left Ventricle: Left ventricular ejection fraction, by estimation, is 65 to 70%. The left ventricle has normal function. The left ventricle has no regional wall motion abnormalities. The left ventricular internal cavity size was normal in size. There is  no left ventricular hypertrophy. Left ventricular diastolic parameters are consistent with Grade I diastolic dysfunction (impaired relaxation). Right Ventricle: The right ventricular size is normal. Right ventricular systolic function is normal. Tricuspid regurgitation signal is inadequate for assessing PA pressure. Left Atrium: Left atrial size was normal in size. Right Atrium: Right atrial size was normal in size. Pericardium: There is no evidence of pericardial effusion. Mitral Valve: No evidence of mitral valve regurgitation. Tricuspid Valve: Tricuspid  valve regurgitation is not demonstrated. Aortic Valve: The aortic valve was not well visualized. Aortic valve regurgitation is not visualized. Aortic valve mean gradient  measures 11.0 mmHg. Aortic valve peak gradient measures 21.3 mmHg. Aortic valve area, by VTI measures 2.67 cm. Pulmonic Valve: The pulmonic valve was not well visualized. Pulmonic valve regurgitation is not visualized. Aorta: The aortic root and ascending aorta are structurally normal, with no evidence of dilitation. Venous: The inferior vena cava is normal in size with greater than 50% respiratory variability, suggesting right atrial pressure of 3 mmHg. IAS/Shunts: The interatrial septum was not well visualized.  LEFT VENTRICLE PLAX 2D LVIDd:         4.20 cm   Diastology LVIDs:         2.20 cm   LV e' medial:    9.79 cm/s LV PW:         0.90 cm   LV E/e' medial:  6.5 LV IVS:        0.90 cm   LV e' lateral:   14.30 cm/s LVOT diam:     1.80 cm   LV E/e' lateral: 4.5 LV SV:         80 LV SV Index:   42 LVOT Area:     2.54 cm  RIGHT VENTRICLE RV S prime:     19.00 cm/s TAPSE (M-mode): 2.0 cm LEFT ATRIUM             Index        RIGHT ATRIUM           Index LA diam:        2.80 cm 1.48 cm/m   RA Area:     11.00 cm LA Vol (A2C):   44.1 ml 23.31 ml/m  RA Volume:   21.40 ml  11.31 ml/m LA Vol (A4C):   28.0 ml 14.80 ml/m LA Biplane Vol: 36.6 ml 19.35 ml/m  AORTIC VALVE AV Area (Vmax):    2.24 cm AV Area (Vmean):   2.27 cm AV Area (VTI):     2.67 cm AV Vmax:           231.00 cm/s AV Vmean:          150.000 cm/s AV VTI:            0.299 m AV Peak Grad:      21.3 mmHg AV Mean Grad:      11.0 mmHg LVOT Vmax:         203.00 cm/s LVOT Vmean:        134.000 cm/s LVOT VTI:          0.314 m LVOT/AV VTI ratio: 1.05  AORTA Ao Root diam: 3.50 cm MITRAL VALVE MV Area (PHT): 2.50 cm     SHUNTS MV Decel Time: 303 msec     Systemic VTI:  0.31 m MV E velocity: 63.70 cm/s   Systemic Diam: 1.80 cm MV A velocity: 119.00 cm/s MV E/A ratio:  0.54 [place] signed by Carolan Clines Signature Date/Time: 10/11/2023/4:13:58 PM    Final     Cardiac Studies   Echocardiogram 10/11/2023: Impressions: 1. Left ventricular  ejection fraction, by estimation, is 65 to 70%. The  left ventricle has normal function. The left ventricle has no regional  wall motion abnormalities. Left ventricular diastolic parameters are  consistent with Grade I diastolic  dysfunction (impaired relaxation).   2. Right ventricular systolic function is normal. The right ventricular  size is normal. Tricuspid  regurgitation signal is inadequate for assessing  PA pressure.   3. No evidence of mitral valve regurgitation.   4. The aortic valve was not well visualized. Aortic valve regurgitation  is not visualized.   5. The inferior vena cava is normal in size with greater than 50%  respiratory variability, suggesting right atrial pressure of 3 mmHg.    Patient Profile     58 y.o. 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  Assessment & Plan    Chest/ Jaw Pain Possible Coronary Vasospams Patient presented with intermittent chest/ jaw pain that has been going on for the last week but occurring more frequently. EKG showed no acute ischemic changes and high-sensitivity troponin negative x2. Echo showed normal LV function and no regional wall motion abnormalities. Episodes of jaw/ chest pain during admission have correlated with transient ST elevation noted on telemetry raising concern for coronary spasms. - Currently pain free.  However, patient has had 4 episodes of  jaw pain this morning that have last a couple of minutes at a time. These episodes continue to correlated with transient ST elevation on telemetry. EKG this morning shows no acute ischemic changes.  - Will repeat a high-sensitivity troponin this morning. - Continue Amlodipine 2.5mg  daily and Imdur 60mg  daily. He just received morning medications. If he has any recurrent jaw  pain this morning, can start IV Nitroglycerin. Of note, he does reports getting a headache with Imdur. Will give Tylenol for this now. May ultimately have to stop Imdur and increase Amlodipine. - Continue statin.  - Plan is for [facility] today. The patient understands that risks include but are not limited to stroke (1 in 1000), death (1 in 1000), kidney failure [usually temporary] (1 in 500), bleeding (1 in 200), allergic reaction [possibly serious] (1 in 200), and agrees to proceed.   Non-Sustained VT Patient had one documented episode of NSVT on Kardia mobile device. No recurrence noted on telemetry this admission. - No recurrence. - He was initially started on Lopressor but this was stopped to focus on treatment of vasospasms with nitrates and calcium channel blockers. - Continue to monitor on telemetry.  Hyperlipidemia Lipid panel this admission: Total Cholesterol 185, Triglycerides 396, HDL 36, LDL 70. - Home Lipitor has been increased from 20mg  to 40mg  daily.  - Consider adding Vascepa to help with triglycerides. - Will need repeat lipid panel and LFTs in 6-8 weeks.  History of PE - On chronic anticoagulation with Eliquis at home. However, this is currently on hold and patient is on IV Heparin in anticipation for cardiac catheterization.  Otherwise, per primary team: - COPD - Non-small cell lung cancer    For questions or updates, please contact [facility] Please consult [email or web address] for contact info under        Signed, Corrin Parker, [name]  10/13/2023, 8:16 AM   I have seen and examined the patient along with Corrin Parker, [name] .  I have reviewed the chart, notes and new data.  I agree with PA/NP's note.  Key new complaints: he describes chest discomfort, associated with diaphoresis and pallor, occurred 4 x yesterday and 4 times this AM, each time brief and resolved spontaneously. First event a week ago. One episode at home associated with NSVT recorded on his  Kardia monitor. Seems to  be more common in the morning. Key examination changes:  normal CV exam Key new findings / data: 1-2 mm ST elevation on telemetry leads this AM around 7 o'clock, no ventricular arrhythmia. Echo with normal LV and RV function and wall motion. ECG - NSR, normal troponin. Coronary artery and aortic atherosclerotic calcifications noted on CT.  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 Triggers are unclear. He has ben on current immunotherapy for almost a year and symptoms started more recently. Discussed the fact that coronary vasospasm most commonly occurs on top of a preexisting coronary stenosis, less commonly on a truly normal vessel. Just received LA nitrate and amlodipine this AM. If symptoms recur, start IV NTG. Continue IV heparin. For cardiac cath +/- PCI around 1 PM today. Aggressively treat all lipid risk factors. This procedure has been fully reviewed with the patient and written informed consent has been obtained.  Thurmon Fair, MD, [facility] [phone] office [phone] pager

## 2023-10-13 NOTE — TOC Initial Note (Signed)
 Transition of Care [facility]) - Initial/Assessment Note    Patient Details  Name: Brandon Phillips. MRN: [id number] Date of Birth: 02-01-1966  Transition of Care [facility]) CM/SW Contact:    Alesia Richards, RN Phone Number: [phone] 10/13/2023, 12:47 PM  Clinical Narrative:                 CM to patient's room regarding TOC screening assessment. CM introduced case management role and discharge care planning process. Patient and patient's wife verbalized understanding and agreement with TOC interview. Patient states lives with wife, Kennith Center and 5 golden retrievers and 1 cat--vaccinated. Per patient and patient's wife, patient visited pulmonologist and was placed on steroids and antibiotics. Patient states is independent in ADLs. Patient states is receiving treatment for lung cancer. Patient's wife, is caregiver support person and will drive patient home upon discharge.   Expected Discharge Plan: Home/Self Care Barriers to Discharge: Continued Medical Work up   Patient Goals and CMS Choice Patient states their goals for this hospitalization and ongoing recovery are:: to return home with patient's wife, Kennith Center   Expected Discharge Plan and Services   Discharge Planning Services: CM Consult   Living arrangements for the past 2 months: Single Family Home                    Prior Living Arrangements/Services Living arrangements for the past 2 months: Single Family Home Lives with:: Spouse Patient language and need for interpreter reviewed:: Yes (English, no interpreter needed.) Do you feel safe going back to the place where you live?: Yes      Need for Family Participation in Patient Care: Yes (Comment) Care giver support system in place?: Yes (comment) Current home services: DME (Home oxygen (2LPM nocturnal use); nebulizer, pulse oximetry, blood pressure cuff) Criminal Activity/Legal Involvement Pertinent to Current Situation/Hospitalization: No - Comment as needed  Activities of  Daily Living   ADL Screening (condition at time of admission) Independently performs ADLs?: Yes (appropriate for developmental age) Is the patient deaf or have difficulty hearing?: No Does the patient have difficulty seeing, even when wearing glasses/contacts?: No Does the patient have difficulty concentrating, remembering, or making decisions?: No  Permission Sought/Granted Permission sought to share information with :  (Not applicable)    Emotional Assessment Appearance:: Appears stated age Attitude/Demeanor/Rapport: Engaged Affect (typically observed): Calm Orientation: : Oriented to Self, Oriented to Place, Oriented to  Time, Oriented to Situation Alcohol / Substance Use: Not Applicable Psych Involvement: No (comment)  Admission diagnosis:  Unstable angina (HCC) [I20.0] History of lung cancer [Z85.118] Patient Active Problem List   Diagnosis Date Noted   Ventricular tachycardia (HCC) 10/10/2023   Unstable angina (HCC) 10/10/2023   Left lower quadrant abdominal pain 04/16/2023   Chronic respiratory failure with hypoxia (HCC) 02/13/2023   COPD with acute exacerbation (HCC) 12/05/2022   HLD (hyperlipidemia) 12/05/2022   Voice hoarseness 12/04/2022   Pulmonary embolism (HCC) 12/04/2022   Primary cancer of left lower lobe of lung (HCC) 09/17/2022   Malignant neoplasm of lower lobe of left lung (HCC) 09/17/2022   Mass of lower lobe of left lung 08/29/2022   Seasonal allergic rhinitis 01/15/2018   Former heavy cigarette smoker (20-39 per day) 01/15/2018   Hypertension goal BP (blood pressure) < 130/80 05/07/2017   Cough with sputum 08/06/2009   PCP:  [name], NP Pharmacy:   [facility] [id number] - [place], [place] - [address] [place] [address] Phone: [phone]  Fax: [phone]  Social Drivers of Health (SDOH) Social History: SDOH Screenings   Food Insecurity: No Food Insecurity (10/11/2023)  Housing: Low Risk  (10/11/2023)   Transportation Needs: No Transportation Needs (10/11/2023)  Utilities: Not At Risk (10/11/2023)  Alcohol Screen: Low Risk  (07/08/2023)  Depression (PHQ2-9): Low Risk  (07/03/2023)  Financial Resource Strain: Low Risk  (07/08/2023)  Physical Activity: Insufficiently Active (07/08/2023)  Social Connections: Moderately Integrated (10/11/2023)  Stress: No Stress Concern Present (07/08/2023)  Tobacco Use: Medium Risk (10/11/2023)   SDOH Interventions:     Readmission Risk Interventions     No data to display

## 2023-10-13 NOTE — Interval H&P Note (Signed)
 History and Physical Interval Note:  10/13/2023 10:43 AM  Brandon Phillips.  has presented today for surgery, with the diagnosis of unstable angina.  The various methods of treatment have been discussed with the patient and family. After consideration of risks, benefits and other options for treatment, the patient has consented to  Procedure(s): LEFT HEART CATH AND CORONARY ANGIOGRAPHY (N/A) and possible coronary intervention as a surgical intervention.  The patient's history has been reviewed, patient examined, no change in status, stable for surgery.  I have reviewed the patient's chart and labs.  Questions were answered to the patient's satisfaction.    Patient with lung cancer, presently undergoing chemotherapy, admitted to the hospital with chest pain, patient having paroxysmal episodes of transient ST elevations on telemetry, please see below, cardiac markers are negative for myocardial injury.  Resting EKG normal sinus rhythm.   Yates Decamp

## 2023-10-13 NOTE — Progress Notes (Signed)
 PROGRESS NOTE    Brandon Mound.  ZHY:[id number] DOB: 1965/09/21 DOA: 10/10/2023 PCP: [name], NP   Brief Narrative:  Brandon Dubeau. is a 58 y.o. male with medical history significant of  lung cancer on immuotherapy, HLD, COPD, PE on low dose Eliquis,hx of tobacco abuse who presented with complaint of chest pain which was associated with radiation to the jaw and nausea and diaphoresis.  patient has cardio-mobile EKG  which noted ventricular tachycardia during episodes. He went to his oncologist and was encouraged to come to the emergency department for further evaluation.  Assessment & Plan:   Principal Problem:   Unstable angina (HCC) Active Problems:   Ventricular tachycardia (HCC)  Chest pain: Considered to be unstable angina.  Seen by cardiology.  Troponins negative-ACS ruled out.  Patient appears to have some ST elevation which was transient on the telemetry this morning.  Cardiology has made some changes, added nitroglycerin and calcium channel blockers and stopped beta-blocker.  Patient continues to have similar symptoms intermittently.  Plan for cardiac cath today.  History of lung cancer: Follows with Dr. Myna Hidalgo.  Has been seen as inpatient once.  History of PE: On Eliquis PTA, currently on full dose heparin drip.  Hyperlipidemia/hypertriglyceridemia: Continue atorvastatin.  COPD: Stable, not in acute exacerbation.  Continue home medications.  Mild hyponatremia: 133.  He is asymptomatic.  DVT prophylaxis: Heparin   Code Status: Full Code  Family Communication:  None present at bedside.  Plan of care discussed with patient in length and he/she verbalized understanding and agreed with it.  Status is: Inpatient Remains inpatient appropriate because: Still symptomatic.  Cardiac catheter today.  Estimated body mass index is 29.96 kg/m as calculated from the following:   Height as of this encounter: 5\' 5"  (1.651 m).   Weight as of this encounter: 81.7  kg.    Nutritional Assessment: Body mass index is 29.96 kg/m.Marland Kitchen Seen by dietician.  I agree with the assessment and plan as outlined below: Nutrition Status:        . Skin Assessment: I have examined the patient's skin and I agree with the wound assessment as performed by the wound care RN as outlined below:    Consultants:  Cardiology  Procedures:  None  Antimicrobials:  Anti-infectives (From admission, onward)    None         Subjective: Patient seen and examined, wife at the bedside.  He says that he has had similar symptoms lasting for 2 to 2.5 minutes and he has had 4 episodes of like that just this morning.  No other complaint at the moment.  Objective: Vitals:   10/12/23 1920 10/13/23 0413 10/13/23 0801 10/13/23 0823  BP: 110/75 (!) 116/99 117/83   Pulse: 89 76    Resp: 18 20    Temp: 98.1 F (36.7 C) 98.2 F (36.8 C)    TempSrc: Oral Oral    SpO2: 97% 94%  96%  Weight:      Height:        Intake/Output Summary (Last 24 hours) at 10/13/2023 0942 Last data filed at 10/13/2023 0448 Gross per 24 hour  Intake 609.23 ml  Output --  Net 609.23 ml    Filed Weights   10/10/23 1158  Weight: 81.7 kg    Examination:  General exam: Appears calm and comfortable  Respiratory system: Clear to auscultation. Respiratory effort normal. Cardiovascular system: S1 & S2 heard, RRR. No JVD, murmurs, rubs, gallops or clicks. No pedal edema.  Gastrointestinal system: Abdomen is nondistended, soft and nontender. No organomegaly or masses felt. Normal bowel sounds heard. Central nervous system: Alert and oriented. No focal neurological deficits. Extremities: Symmetric 5 x 5 power. Skin: No rashes, lesions or ulcers.  Psychiatry: Judgement and insight appear normal. Mood & affect appropriate.   Data Reviewed: I have personally reviewed following labs and imaging studies  CBC: Recent Labs  Lab 10/10/23 0925 10/10/23 1134 10/11/23 0100 10/12/23 0500  10/13/23 0430  WBC 7.6 7.5 7.9 8.1 7.8  NEUTROABS 6.0  --   --   --  6.0  HGB 14.4 14.6 14.8 14.3 14.3  HCT 42.0 42.1 42.3 40.9 40.9  MCV 87.3 86.3 86.5 86.3 85.6  PLT 207 224 230 213 243   Basic Metabolic Panel: Recent Labs  Lab 10/10/23 0925 10/10/23 1134 10/13/23 0430  NA 136 135 133*  K 3.9 3.8 3.8  CL 101 99 101  CO2 28 28 22   GLUCOSE 95 105* 107*  BUN 15 14 9   CREATININE 0.93 0.98 1.13  CALCIUM 9.0 8.9 8.6*  MG  --  1.8  --    GFR: Estimated Creatinine Clearance: 71.9 mL/min (by C-G formula based on SCr of 1.13 mg/dL). Liver Function Tests: Recent Labs  Lab 10/10/23 0925 10/13/23 0430  AST 19 29  ALT 34 42  ALKPHOS 52 44  BILITOT 0.5 0.4  PROT 6.9 6.5  ALBUMIN 4.2 3.3*   No results for input(s): "LIPASE", "AMYLASE" in the last 168 hours. No results for input(s): "AMMONIA" in the last 168 hours. Coagulation Profile: Recent Labs  Lab 10/10/23 1134  INR 1.0   Cardiac Enzymes: No results for input(s): "CKTOTAL", "CKMB", "CKMBINDEX", "TROPONINI" in the last 168 hours. BNP (last 3 results) No results for input(s): "PROBNP" in the last 8760 hours. HbA1C: Recent Labs    10/12/23 0500  HGBA1C 6.2*   CBG: No results for input(s): "GLUCAP" in the last 168 hours. Lipid Profile: Recent Labs    10/13/23 0430  CHOL 185  HDL 36*  LDLCALC 70  TRIG 161*  CHOLHDL 5.1   Thyroid Function Tests: No results for input(s): "TSH", "T4TOTAL", "FREET4", "T3FREE", "THYROIDAB" in the last 72 hours.  Anemia Panel: No results for input(s): "VITAMINB12", "FOLATE", "FERRITIN", "TIBC", "IRON", "RETICCTPCT" in the last 72 hours. Sepsis Labs: No results for input(s): "PROCALCITON", "LATICACIDVEN" in the last 168 hours.  No results found for this or any previous visit (from the past 240 hours).   Radiology Studies: ECHOCARDIOGRAM COMPLETE Result Date: 10/11/2023    ECHOCARDIOGRAM REPORT   Patient Name:   Brandon Schonberg. Date of Exam: 10/11/2023 Medical Rec #:   [id number]              Height:       65.0 in Accession #:    [id number]             Weight:       180.0 lb Date of Birth:  [birth year]             BSA:          1.892 m Patient Age:    57 years               BP:           121/90 mmHg Patient Gender: M                      HR:  73 bpm. Exam Location:  Inpatient Procedure: 2D Echo, Cardiac Doppler and Color Doppler (Both Spectral and Color            Flow Doppler were utilized during procedure). Indications:    Chest Pain R07.9  History:        Patient has prior history of Echocardiogram examinations, most                 recent 09/10/2022. Risk Factors:Hypertension, Diabetes and                 Dyslipidemia. Hx of lung cancer.  Sonographer:    Celesta Gentile RCS Referring Phys: [id number] SARA-MAIZ A THOMAS IMPRESSIONS  1. Left ventricular ejection fraction, by estimation, is 65 to 70%. The left ventricle has normal function. The left ventricle has no regional wall motion abnormalities. Left ventricular diastolic parameters are consistent with Grade I diastolic dysfunction (impaired relaxation).  2. Right ventricular systolic function is normal. The right ventricular size is normal. Tricuspid regurgitation signal is inadequate for assessing PA pressure.  3. No evidence of mitral valve regurgitation.  4. The aortic valve was not well visualized. Aortic valve regurgitation is not visualized.  5. The inferior vena cava is normal in size with greater than 50% respiratory variability, suggesting right atrial pressure of 3 mmHg. FINDINGS  Left Ventricle: Left ventricular ejection fraction, by estimation, is 65 to 70%. The left ventricle has normal function. The left ventricle has no regional wall motion abnormalities. The left ventricular internal cavity size was normal in size. There is  no left ventricular hypertrophy. Left ventricular diastolic parameters are consistent with Grade I diastolic dysfunction (impaired relaxation). Right Ventricle: The right ventricular  size is normal. Right ventricular systolic function is normal. Tricuspid regurgitation signal is inadequate for assessing PA pressure. Left Atrium: Left atrial size was normal in size. Right Atrium: Right atrial size was normal in size. Pericardium: There is no evidence of pericardial effusion. Mitral Valve: No evidence of mitral valve regurgitation. Tricuspid Valve: Tricuspid valve regurgitation is not demonstrated. Aortic Valve: The aortic valve was not well visualized. Aortic valve regurgitation is not visualized. Aortic valve mean gradient measures 11.0 mmHg. Aortic valve peak gradient measures 21.3 mmHg. Aortic valve area, by VTI measures 2.67 cm. Pulmonic Valve: The pulmonic valve was not well visualized. Pulmonic valve regurgitation is not visualized. Aorta: The aortic root and ascending aorta are structurally normal, with no evidence of dilitation. Venous: The inferior vena cava is normal in size with greater than 50% respiratory variability, suggesting right atrial pressure of 3 mmHg. IAS/Shunts: The interatrial septum was not well visualized.  LEFT VENTRICLE PLAX 2D LVIDd:         4.20 cm   Diastology LVIDs:         2.20 cm   LV e' medial:    9.79 cm/s LV PW:         0.90 cm   LV E/e' medial:  6.5 LV IVS:        0.90 cm   LV e' lateral:   14.30 cm/s LVOT diam:     1.80 cm   LV E/e' lateral: 4.5 LV SV:         80 LV SV Index:   42 LVOT Area:     2.54 cm  RIGHT VENTRICLE RV S prime:     19.00 cm/s TAPSE (M-mode): 2.0 cm LEFT ATRIUM             Index  RIGHT ATRIUM           Index LA diam:        2.80 cm 1.48 cm/m   RA Area:     11.00 cm LA Vol (A2C):   44.1 ml 23.31 ml/m  RA Volume:   21.40 ml  11.31 ml/m LA Vol (A4C):   28.0 ml 14.80 ml/m LA Biplane Vol: 36.6 ml 19.35 ml/m  AORTIC VALVE AV Area (Vmax):    2.24 cm AV Area (Vmean):   2.27 cm AV Area (VTI):     2.67 cm AV Vmax:           231.00 cm/s AV Vmean:          150.000 cm/s AV VTI:            0.299 m AV Peak Grad:      21.3 mmHg AV Mean  Grad:      11.0 mmHg LVOT Vmax:         203.00 cm/s LVOT Vmean:        134.000 cm/s LVOT VTI:          0.314 m LVOT/AV VTI ratio: 1.05  AORTA Ao Root diam: 3.50 cm MITRAL VALVE MV Area (PHT): 2.50 cm     SHUNTS MV Decel Time: 303 msec     Systemic VTI:  0.31 m MV E velocity: 63.70 cm/s   Systemic Diam: 1.80 cm MV A velocity: 119.00 cm/s MV E/A ratio:  0.54 Photographer signed by Carolan Clines Signature Date/Time: 10/11/2023/4:13:58 PM    Final     Scheduled Meds:  amLODipine  2.5 mg Oral Daily   atorvastatin  40 mg Oral Daily   Chlorhexidine Gluconate Cloth  6 each Topical Daily   fluticasone furoate-vilanterol  1 puff Inhalation Daily   And   umeclidinium bromide  1 puff Inhalation Daily   folic acid  1 mg Oral Daily   isosorbide mononitrate  60 mg Oral Daily   Continuous Infusions:  sodium chloride 1 mL/kg/hr (10/13/23 0522)   heparin 1,400 Units/hr (10/13/23 0448)     LOS: 2 days   Hughie Closs, MD [facility]  10/13/2023, 9:42 AM   *Please note that this is a verbal dictation therefore any spelling or grammatical errors are due to the "Dragon Medical One" system interpretation.  Please page via Amion and do not message via secure chat for urgent patient care matters. Secure chat can be used for non urgent patient care matters.  How to contact the [facility] Attending or Consulting provider 7A - 7P or covering provider during after hours 7P -7A, for this patient?  Check the care team in [facility] and look for a) attending/consulting [facility] provider listed and b) the [facility] team listed. Page or secure chat 7A-7P. Log into [email or web address] and use [facility] universal password to access. If you do not have the password, please contact the hospital operator. Locate the [facility] provider you are looking for under [facility] and page to a number that you can be directly reached. If you still have difficulty reaching the provider, please page the [facility] (Director on Call) for the  Hospitalists listed on amion for assistance.

## 2023-10-13 NOTE — Telephone Encounter (Signed)
 Patient Product/process development scientist completed.    The patient is insured through [facility]. Patient has [employer], may use a copay card, and/or apply for patient assistance if available.    Ran test claim for Vascepa 1 g and the current 30 day co-pay is $0.00.   This test claim was processed through [facility]- copay amounts may vary at other pharmacies due to pharmacy/plan contracts, or as the patient moves through the different stages of their insurance plan.     Brandon Phillips, CPHT Pharmacy Technician III Certified Patient Advocate [facility] Patient Advocate Team Direct Number: [phone]  Fax: [phone]

## 2023-10-14 ENCOUNTER — Other Ambulatory Visit (HOSPITAL_COMMUNITY): Payer: Self-pay

## 2023-10-14 DIAGNOSIS — I200 Unstable angina: Secondary | ICD-10-CM | POA: Diagnosis not present

## 2023-10-14 DIAGNOSIS — I4729 Other ventricular tachycardia: Secondary | ICD-10-CM

## 2023-10-14 DIAGNOSIS — I201 Angina pectoris with documented spasm: Secondary | ICD-10-CM

## 2023-10-14 LAB — BASIC METABOLIC PANEL
Anion gap: 7 (ref 5–15)
BUN: 11 mg/dL (ref 6–20)
CO2: 26 mmol/L (ref 22–32)
Calcium: 9.1 mg/dL (ref 8.9–10.3)
Chloride: 100 mmol/L (ref 98–111)
Creatinine, Ser: 1.02 mg/dL (ref 0.61–1.24)
GFR, Estimated: >60 mL/min (ref >60)
Glucose, Bld: 127 mg/dL — ABNORMAL HIGH (ref 70–99)
Potassium: 3.9 mmol/L (ref 3.5–5.1)
Sodium: 133 mmol/L — ABNORMAL LOW (ref 135–145)

## 2023-10-14 LAB — CBC
HCT: 41.7 % (ref 39.0–52.0)
Hemoglobin: 14.4 g/dL (ref 13.0–17.0)
MCH: 29.6 pg (ref 26.0–34.0)
MCHC: 34.5 g/dL (ref 30.0–36.0)
MCV: 85.8 fL (ref 80.0–100.0)
Platelets: 235 10*3/uL (ref 150–400)
RBC: 4.86 MIL/uL (ref 4.22–5.81)
RDW: 13.7 % (ref 11.5–15.5)
WBC: 7.1 10*3/uL (ref 4.0–10.5)
nRBC: 0 % (ref 0.0–0.2)

## 2023-10-14 LAB — LIPOPROTEIN A (LPA): Lipoprotein (a): 20.6 nmol/L (ref <75.0)

## 2023-10-14 MED ORDER — ATORVASTATIN CALCIUM 40 MG PO TABS
40.0000 mg | ORAL_TABLET | Freq: Every day | ORAL | 0 refills | Status: DC
  Filled 2023-10-14: qty 30, 30d supply, fill #0

## 2023-10-14 MED ORDER — AMLODIPINE BESYLATE 5 MG PO TABS
5.0000 mg | ORAL_TABLET | Freq: Every day | ORAL | 0 refills | Status: DC
  Filled 2023-10-14: qty 30, 30d supply, fill #0

## 2023-10-14 MED ORDER — ASPIRIN 81 MG PO TBEC
81.0000 mg | DELAYED_RELEASE_TABLET | Freq: Every day | ORAL | 0 refills | Status: DC
  Filled 2023-10-14: qty 30, 30d supply, fill #0

## 2023-10-14 MED ORDER — ISOSORBIDE MONONITRATE ER 30 MG PO TB24
30.0000 mg | ORAL_TABLET | Freq: Every day | ORAL | 0 refills | Status: DC
  Filled 2023-10-14: qty 30, 30d supply, fill #0

## 2023-10-14 MED ORDER — HEPARIN SOD (PORK) LOCK FLUSH 100 UNIT/ML IV SOLN
500.0000 [IU] | INTRAVENOUS | Status: AC | PRN
  Administered 2023-10-14: 500 [IU]

## 2023-10-14 MED ORDER — ISOSORBIDE MONONITRATE ER 30 MG PO TB24: 30.0000 mg | ORAL_TABLET | Freq: Every day | ORAL | Status: DC

## 2023-10-14 NOTE — Discharge Summary (Addendum)
 Physician Discharge Summary  Brandon Phillips. DGU:[id number] DOB: 02/18/1966 DOA: 10/10/2023  PCP: [name], NP  Admit date: 10/10/2023 Discharge date: 10/14/2023 30 Day Unplanned Readmission Risk Score    Flowsheet Row ED to Hosp-Admission (Current) from 10/10/2023 in [facility]  30 Day Unplanned Readmission Risk Score (%) 12.35 Filed at 10/14/2023 0801       This score is the patient's risk of an unplanned readmission within 30 days of being discharged (0 -100%). The score is based on dignosis, age, lab data, medications, orders, and past utilization.   Low:  0-14.9   Medium: 15-21.9   High: 22-29.9   Extreme: 30 and above          Admitted From: Home Disposition: Home  Recommendations for Outpatient Follow-up:  Follow up with PCP in 1-2 weeks Please obtain BMP/CBC in one week Follow-up with cardiologist in 1 to 2 weeks Please follow up with your PCP on the following pending results: Unresulted Labs (From admission, onward)     Start     Ordered   10/12/23 0500  CBC  Daily,   R      10/11/23 1105              Home Health: None Equipment/Devices: None  Discharge Condition: Stable CODE STATUS: Full code Diet recommendation: Cardiac  Subjective: Seen and examined this morning with cardiology, wife at the bedside.  Patient says that he has had 2-3 more episodes of angina this morning but again the last 2-2.5 min.  Currently he is symptom-free.  He and his wife feel comfortable going home.  He is cleared by cardiology as well.  Brief/Interim Summary: Brandon Kellman. is a 58 y.o. male with medical history significant of  lung cancer on immuotherapy, HLD, COPD, PE on low dose Eliquis,hx of tobacco abuse who presented with complaint of chest pain which was associated with radiation to the jaw and nausea and diaphoresis.  patient has cardio-mobile EKG  which noted ventricular tachycardia during episodes. He went to his oncologist and was  encouraged to come to the emergency department for further evaluation.  Admitted under hospital service, details below.   Chest pain: Considered to be unstable angina.  Seen by cardiology.  Troponins negative-ACS ruled out. Episodes of jaw/ chest pain during admission have correlated with transient ST elevation noted on telemetry raising concern for coronary spasms.   Seen by cardiology, patient underwent cardiac cath which showed showed nonobstructive CAD.  Presentation suggests coronary vasospasm. -On amlodipine and Imdur 60mg  daily.  Cardiology cleared him for discharge medications.  They will follow-up with him closely as outpatient.   History of lung cancer: Follows with Dr. Myna Hidalgo.  Has been seen as inpatient once.   History of PE: On Eliquis PTA, currently on full dose heparin drip.   Hyperlipidemia/hypertriglyceridemia: Increased triglycerides, increased atorvastatin from 20 mg PTA to 40 mg at discharge per cardiology recommendations.   COPD: Stable, not in acute exacerbation.  Continue home medications.   Mild hyponatremia: 133.  He is asymptomatic.  Discharge plan was discussed with patient and/or family member and they verbalized understanding and agreed with it.  Discharge Diagnoses:  Principal Problem:   Unstable angina [facility]) Active Problems:   NSVT (nonsustained ventricular tachycardia) (HCC)   Coronary vasospasm (HCC)    Discharge Instructions   Allergies as of 10/14/2023       Reactions   Imdur [isosorbide Nitrate] Other (See Comments)   Headache  Medication List     STOP taking these medications    telmisartan 20 MG tablet Commonly known as: MICARDIS       TAKE these medications    albuterol 108 (90 Base) MCG/ACT inhaler Commonly known as: VENTOLIN HFA Inhale 2 puffs into the lungs every 6 (six) hours as needed for wheezing.   albuterol (2.5 MG/3ML) 0.083% nebulizer solution Commonly known as: PROVENTIL Take 3 mLs (2.5 mg total) by  nebulization every 6 (six) hours as needed for wheezing or shortness of breath.   amLODipine 5 MG tablet Commonly known as: NORVASC Take 1 tablet (5 mg total) by mouth daily. Start taking on: October 15, 2023   apixaban 2.5 MG Tabs tablet Commonly known as: ELIQUIS Take 1 tablet (2.5 mg total) by mouth 2 (two) times daily.   aspirin EC 81 MG tablet Take 1 tablet (81 mg total) by mouth daily. Swallow whole. Start taking on: October 15, 2023   atorvastatin 40 MG tablet Commonly known as: LIPITOR Take 1 tablet (40 mg total) by mouth daily. Start taking on: October 15, 2023 What changed:  medication strength how much to take how to take this when to take this additional instructions   Breztri Aerosphere 160-9-4.8 MCG/ACT Aero Generic drug: budeson-glycopyrrolate-formoterol Inhale 2 puffs into the lungs in the morning and at bedtime.   COENZYME Q10 PO Take 100 mg by mouth in the morning.   folic acid 1 MG tablet Commonly known as: FOLVITE TAKE 1 TABLET BY MOUTH DAILY. START 7 DAYS BEFORE PEMETREXED CHEMOTHERAPY, CONTINUE UNTIL 21 DAYS AFTER PEMETREXED COMPLETED   gabapentin 300 MG capsule Commonly known as: NEURONTIN Take 1 capsule (300 mg total) by mouth 2 (two) times daily. TAKE 1 CAPSULE BY MOUTH EVERY NIGHT AT BEDTIME FOR 3 NIGHTS, THEN INCREASE TO TAKE 1 CAPSULE BY MOUTH 2 TIMES A DAY FOR 1 WEEK, THEN OK TO INCREASE TO TAKE 1 CAPSULE BY MOUTH 3 TIMES A DAY   GUAIFENESIN 1200 PO Take 1,200 mg by mouth in the morning and at bedtime.   isosorbide mononitrate 30 MG 24 hr tablet Commonly known as: IMDUR Take 1 tablet (30 mg total) by mouth daily. Start taking on: October 15, 2023   lidocaine-prilocaine cream Commonly known as: EMLA Apply a dime size to port-a-cath 1-2 hours prior to access. Cover with saran wrap.   multivitamin capsule Take 1 capsule by mouth at bedtime.        Follow-up Information     [name], NP Follow up in 1 week(s).   Specialty: Nurse  Practitioner Contact information: [address] [place] [address] [phone]                Allergies  Allergen Reactions   Imdur [Isosorbide Nitrate] Other (See Comments)    Headache    Consultations:cards and oncology   Procedures/Studies: CARDIAC CATHETERIZATION Result Date: 10/13/2023 Table formatting from the original result was not included. Images from the original result were not included. Left Heart Catheterization 10/13/23: Hemodynamic data: LVEDP 12 mmHg.  No pressure gradient across the aortic valve. Angiographic data: There is diffuse coronary calcification.  Left dominant circulation. LM large-caliber vessel, bifurcates into LAD and CX. LCx: Dominant vessel and a very large caliber vessel giving origin to large OM1, small OM 2, OM 3, large PDA and PL branch distally.  Proximal circumflex has mild 30% stenosis, large OM1 with proximal 30% stenosis. LAD: It is moderately calcified diffusely.  There is diffuse luminal  irregularity.  Gives origin to large D1 with a ostial 50 to 60% stenosis.  After the D1 origin, mid LAD has calcific 50% stenosis, mild disease in the mid to distal segment followed by another 50% stenosis in the midsegment.  LAD ends at the apex, D1 and D3 being large vessels with mild disease. RCA: Nondominant, proximal to mid 40% stenosis. Virtual FFR using CathWorx: Proximal LAD 0.84, distal LAD 0.82, lesions are not hemodynamically significant.  Signed   Transient ST elevations on telemetry       Impression and recommendations: With patient having on and off chest pain with radiation to the jaw and ST elevations noted on the telemetry, suspect coronary vasospasm as etiology.  Adding vasodilator therapy would be appropriate for management of his hypertension and possible coronary spasm.   ECHOCARDIOGRAM COMPLETE Result Date: 10/11/2023    ECHOCARDIOGRAM REPORT   Patient Name:   Brandon Hollings. Date of Exam: 10/11/2023 Medical Rec  #:  [id number]              Height:       65.0 in Accession #:    [id number]             Weight:       180.0 lb Date of Birth:  [birth year]             BSA:          1.892 m Patient Age:    57 years               BP:           121/90 mmHg Patient Gender: M                      HR:           73 bpm. Exam Location:  Inpatient Procedure: 2D Echo, Cardiac Doppler and Color Doppler (Both Spectral and Color            Flow Doppler were utilized during procedure). Indications:    Chest Pain R07.9  History:        Patient has prior history of Echocardiogram examinations, most                 recent 09/10/2022. Risk Factors:Hypertension, Diabetes and                 Dyslipidemia. Hx of lung cancer.  Sonographer:    Celesta Gentile RCS Referring Phys: [id number] SARA-MAIZ A THOMAS IMPRESSIONS  1. Left ventricular ejection fraction, by estimation, is 65 to 70%. The left ventricle has normal function. The left ventricle has no regional wall motion abnormalities. Left ventricular diastolic parameters are consistent with Grade I diastolic dysfunction (impaired relaxation).  2. Right ventricular systolic function is normal. The right ventricular size is normal. Tricuspid regurgitation signal is inadequate for assessing PA pressure.  3. No evidence of mitral valve regurgitation.  4. The aortic valve was not well visualized. Aortic valve regurgitation is not visualized.  5. The inferior vena cava is normal in size with greater than 50% respiratory variability, suggesting right atrial pressure of 3 mmHg. FINDINGS  Left Ventricle: Left ventricular ejection fraction, by estimation, is 65 to 70%. The left ventricle has normal function. The left ventricle has no regional wall motion abnormalities. The left ventricular internal cavity size was normal in size. There is  no left ventricular hypertrophy. Left ventricular diastolic parameters are consistent with Grade I diastolic dysfunction (impaired  relaxation). Right Ventricle: The right  ventricular size is normal. Right ventricular systolic function is normal. Tricuspid regurgitation signal is inadequate for assessing PA pressure. Left Atrium: Left atrial size was normal in size. Right Atrium: Right atrial size was normal in size. Pericardium: There is no evidence of pericardial effusion. Mitral Valve: No evidence of mitral valve regurgitation. Tricuspid Valve: Tricuspid valve regurgitation is not demonstrated. Aortic Valve: The aortic valve was not well visualized. Aortic valve regurgitation is not visualized. Aortic valve mean gradient measures 11.0 mmHg. Aortic valve peak gradient measures 21.3 mmHg. Aortic valve area, by VTI measures 2.67 cm. Pulmonic Valve: The pulmonic valve was not well visualized. Pulmonic valve regurgitation is not visualized. Aorta: The aortic root and ascending aorta are structurally normal, with no evidence of dilitation. Venous: The inferior vena cava is normal in size with greater than 50% respiratory variability, suggesting right atrial pressure of 3 mmHg. IAS/Shunts: The interatrial septum was not well visualized.  LEFT VENTRICLE PLAX 2D LVIDd:         4.20 cm   Diastology LVIDs:         2.20 cm   LV e' medial:    9.79 cm/s LV PW:         0.90 cm   LV E/e' medial:  6.5 LV IVS:        0.90 cm   LV e' lateral:   14.30 cm/s LVOT diam:     1.80 cm   LV E/e' lateral: 4.5 LV SV:         80 LV SV Index:   42 LVOT Area:     2.54 cm  RIGHT VENTRICLE RV S prime:     19.00 cm/s TAPSE (M-mode): 2.0 cm LEFT ATRIUM             Index        RIGHT ATRIUM           Index LA diam:        2.80 cm 1.48 cm/m   RA Area:     11.00 cm LA Vol (A2C):   44.1 ml 23.31 ml/m  RA Volume:   21.40 ml  11.31 ml/m LA Vol (A4C):   28.0 ml 14.80 ml/m LA Biplane Vol: 36.6 ml 19.35 ml/m  AORTIC VALVE AV Area (Vmax):    2.24 cm AV Area (Vmean):   2.27 cm AV Area (VTI):     2.67 cm AV Vmax:           231.00 cm/s AV Vmean:          150.000 cm/s AV VTI:            0.299 m AV Peak Grad:      21.3  mmHg AV Mean Grad:      11.0 mmHg LVOT Vmax:         203.00 cm/s LVOT Vmean:        134.000 cm/s LVOT VTI:          0.314 m LVOT/AV VTI ratio: 1.05  AORTA Ao Root diam: 3.50 cm MITRAL VALVE MV Area (PHT): 2.50 cm     SHUNTS MV Decel Time: 303 msec     Systemic VTI:  0.31 m MV E velocity: 63.70 cm/s   Systemic Diam: 1.80 cm MV A velocity: 119.00 cm/s MV E/A ratio:  0.54 Brandon Phillips signed by Carolan Clines Signature Date/Time: 10/11/2023/4:13:58 PM    Final    CT Angio Chest (PE) W or WO  Contrast Result Date: 10/10/2023 CLINICAL DATA:  Pulmonary embolism (PE) suspected, high prob * Tracking Code: BO * EXAM: CT ANGIOGRAPHY CHEST WITH CONTRAST TECHNIQUE: Multidetector CT imaging of the chest was performed using the standard protocol during bolus administration of intravenous contrast. Multiplanar CT image reconstructions and MIPs were obtained to evaluate the vascular anatomy. RADIATION DOSE REDUCTION: This exam was performed according to the departmental dose-optimization program which includes automated exposure control, adjustment of the mA and/or kV according to patient size and/or use of iterative reconstruction technique. CONTRAST:  75mL OMNIPAQUE IOHEXOL 350 MG/ML SOLN COMPARISON:  CT angiography chest from 04/23/2023. FINDINGS: Cardiovascular: No evidence of embolism to the proximal subsegmental pulmonary artery level. Normal cardiac size. No pericardial effusion. No aortic aneurysm. There are coronary artery calcifications, in keeping with coronary artery disease. There are also mild peripheral atherosclerotic vascular calcifications of thoracic aorta and its major branches. Mediastinum/Nodes: Visualized thyroid gland appears grossly unremarkable. No solid / cystic mediastinal masses. The esophagus is nondistended precluding optimal assessment. No axillary, mediastinal or hilar lymphadenopathy by size criteria. Lungs/Pleura: The central tracheo-bronchial tree is patent. Moderate to severe upper  lobe predominant centrilobular emphysema noted. There is focal scarring and bandlike opacity in the left hilar region with extension into the left upper lobe, overall improved since the prior study, suggesting favorable treatment response. There is also interval decrease in the nodule in the left lung lower lobe currently measuring 1.0 x 1.5 cm, previously 1.6 x 2.0 cm. No new mass or consolidation. No pleural effusion or pneumothorax. No suspicious lung nodules. Upper Abdomen: Visualized upper abdominal viscera within normal limits. Musculoskeletal: A CT Port-a-Cath is seen in the right upper chest wall with the catheter terminating in the cavo-atrial junction region. Visualized soft tissues of the chest wall are otherwise grossly unremarkable. No suspicious osseous lesions. There are mild multilevel degenerative changes in the visualized spine. Old healed right first rib fracture noted. Review of the MIP images confirms the above findings. IMPRESSION: 1. No embolism to the proximal subsegmental pulmonary artery level. 2. Interval decrease in the size of left hilar/upper lobe mass and left lower lobe nodule, compatible with favorable treatment response. No new lung mass or consolidation. No lymphadenopathy by size criteria. 3. Multiple other nonacute observations, as described above. Aortic Atherosclerosis (ICD10-I70.0) and Emphysema (ICD10-J43.9). NO Electronically Signed   By: Jules Schick M.D.   On: 10/10/2023 15:38   DG Chest Port 1 View Result Date: 10/10/2023 CLINICAL DATA:  Chest pain, lung cancer EXAM: PORTABLE CHEST 1 VIEW COMPARISON:  02/13/2023 FINDINGS: Right chest port remains in place. Normal heart size. Scarring or atelectasis in the left perihilar region. Elsewhere, no focal consolidation. No pleural effusion or pneumothorax. IMPRESSION: Scarring or atelectasis in the left perihilar region. No acute cardiopulmonary findings. Electronically Signed   By: Duanne Guess D.O.   On: 10/10/2023  14:19     Discharge Exam: Vitals:   10/14/23 0528 10/14/23 0913  BP: 119/78   Pulse:    Resp: 17   Temp: 97.8 F (36.6 C)   SpO2:  97%   Vitals:   10/13/23 1144 10/13/23 1205 10/14/23 0528 10/14/23 0913  BP: 106/70 110/75 119/78   Pulse: 79 81    Resp: 15 16 17    Temp:  97.8 F (36.6 C) 97.8 F (36.6 C)   TempSrc:  Oral Oral   SpO2: 98% 94%  97%  Weight:      Height:        General: Pt is alert,  awake, not in acute distress Cardiovascular: RRR, S1/S2 +, no rubs, no gallops Respiratory: CTA bilaterally, no wheezing, no rhonchi Abdominal: Soft, NT, ND, bowel sounds + Extremities: no edema, no cyanosis    The results of significant diagnostics from this hospitalization (including imaging, microbiology, ancillary and laboratory) are listed below for reference.     Microbiology: No results found for this or any previous visit (from the past 240 hours).   Labs: BNP (last 3 results) Recent Labs    02/17/23 0930  BNP 87.1   Basic Metabolic Panel: Recent Labs  Lab 10/10/23 0925 10/10/23 1134 10/13/23 0430 10/14/23 0900  NA 136 135 133* 133*  K 3.9 3.8 3.8 3.9  CL 101 99 101 100  CO2 28 28 22 26   GLUCOSE 95 105* 107* 127*  BUN 15 14 9 11   CREATININE 0.93 0.98 1.13 1.02  CALCIUM 9.0 8.9 8.6* 9.1  MG  --  1.8  --   --    Liver Function Tests: Recent Labs  Lab 10/10/23 0925 10/13/23 0430  AST 19 29  ALT 34 42  ALKPHOS 52 44  BILITOT 0.5 0.4  PROT 6.9 6.5  ALBUMIN 4.2 3.3*   No results for input(s): "LIPASE", "AMYLASE" in the last 168 hours. No results for input(s): "AMMONIA" in the last 168 hours. CBC: Recent Labs  Lab 10/10/23 0925 10/10/23 1134 10/11/23 0100 10/12/23 0500 10/13/23 0430 10/14/23 0900  WBC 7.6 7.5 7.9 8.1 7.8 7.1  NEUTROABS 6.0  --   --   --  6.0  --   HGB 14.4 14.6 14.8 14.3 14.3 14.4  HCT 42.0 42.1 42.3 40.9 40.9 41.7  MCV 87.3 86.3 86.5 86.3 85.6 85.8  PLT 207 224 230 213 243 235   Cardiac Enzymes: No results for  input(s): "CKTOTAL", "CKMB", "CKMBINDEX", "TROPONINI" in the last 168 hours. BNP: Invalid input(s): "POCBNP" CBG: No results for input(s): "GLUCAP" in the last 168 hours. D-Dimer No results for input(s): "DDIMER" in the last 72 hours. Hgb A1c Recent Labs    10/12/23 0500  HGBA1C 6.2*   Lipid Profile Recent Labs    10/13/23 0430  CHOL 185  HDL 36*  LDLCALC 70  TRIG 161*  CHOLHDL 5.1   Thyroid function studies No results for input(s): "TSH", "T4TOTAL", "T3FREE", "THYROIDAB" in the last 72 hours.  Invalid input(s): "FREET3" Anemia work up No results for input(s): "VITAMINB12", "FOLATE", "FERRITIN", "TIBC", "IRON", "RETICCTPCT" in the last 72 hours. Urinalysis No results found for: "COLORURINE", "APPEARANCEUR", "LABSPEC", "PHURINE", "GLUCOSEU", "HGBUR", "BILIRUBINUR", "KETONESUR", "PROTEINUR", "UROBILINOGEN", "NITRITE", "LEUKOCYTESUR" Sepsis Labs Recent Labs  Lab 10/11/23 0100 10/12/23 0500 10/13/23 0430 10/14/23 0900  WBC 7.9 8.1 7.8 7.1   Microbiology No results found for this or any previous visit (from the past 240 hours).  FURTHER DISCHARGE INSTRUCTIONS:   Get Medicines reviewed and adjusted: Please take all your medications with you for your next visit with your Primary MD   Laboratory/radiological data: Please request your Primary MD to go over all hospital tests and procedure/radiological results at the follow up, please ask your Primary MD to get all Hospital records sent to his/her office.   In some cases, they will be blood work, cultures and biopsy results pending at the time of your discharge. Please request that your primary care M.D. goes through all the records of your hospital data and follows up on these results.   Also Note the following: If you experience worsening of your admission symptoms, develop shortness of breath, life  threatening emergency, suicidal or homicidal thoughts you must seek medical attention immediately by calling 911 or calling  your MD immediately  if symptoms less severe.   You must read complete instructions/literature along with all the possible adverse reactions/side effects for all the Medicines you take and that have been prescribed to you. Take any new Medicines after you have completely understood and accpet all the possible adverse reactions/side effects.    Do not drive when taking Pain medications or sleeping medications (Benzodaizepines)   Do not take more than prescribed Pain, Sleep and Anxiety Medications. It is not advisable to combine anxiety,sleep and pain medications without talking with your primary care practitioner   Special Instructions: If you have smoked or chewed Tobacco  in the last 2 yrs please stop smoking, stop any regular Alcohol  and or any Recreational drug use.   Wear Seat belts while driving.   Please note: You were cared for by a hospitalist during your hospital stay. Once you are discharged, your primary care physician will handle any further medical issues. Please note that NO REFILLS for any discharge medications will be authorized once you are discharged, as it is imperative that you return to your primary care physician (or establish a relationship with a primary care physician if you do not have one) for your post hospital discharge needs so that they can reassess your need for medications and monitor your lab values  Time coordinating discharge: Over 30 minutes  SIGNED:   Hughie Closs, MD  [facility] 10/14/2023, 10:04 AM *Please note that this is a verbal dictation therefore any spelling or grammatical errors are due to the "[employer] One" system interpretation. If 7PM-7AM, please contact night-coverage [email or web address]

## 2023-10-14 NOTE — Progress Notes (Signed)
 Rounding Note    Patient Name: Brandon Phillips. Date of Encounter: 10/14/2023  [facility] Cardiologist: None   Subjective   Reports had few episodes of chest pain this morning, each lasted for couple minutes.  BP 119/78  Inpatient Medications    Scheduled Meds:  amLODipine  5 mg Oral Daily   apixaban  2.5 mg Oral BID   aspirin EC  81 mg Oral Daily   atorvastatin  40 mg Oral Daily   Chlorhexidine Gluconate Cloth  6 each Topical Daily   fluticasone furoate-vilanterol  1 puff Inhalation Daily   And   umeclidinium bromide  1 puff Inhalation Daily   folic acid  1 mg Oral Daily   isosorbide mononitrate  60 mg Oral Daily   Continuous Infusions:   PRN Meds: acetaminophen, nitroGLYCERIN, ondansetron (ZOFRAN) IV, sodium chloride flush   Vital Signs    Vitals:   10/13/23 1144 10/13/23 1205 10/14/23 0528 10/14/23 0913  BP: 106/70 110/75 119/78   Pulse: 79 81    Resp: 15 16 17    Temp:  97.8 F (36.6 C) 97.8 F (36.6 C)   TempSrc:  Oral Oral   SpO2: 98% 94%  97%  Weight:      Height:        Intake/Output Summary (Last 24 hours) at 10/14/2023 0918 Last data filed at 10/14/2023 0856 Gross per 24 hour  Intake 562.3 ml  Output --  Net 562.3 ml      10/10/2023   11:58 AM 10/10/2023    9:35 AM 09/24/2023    4:18 PM  Last 3 Weights  Weight (lbs) 180 lb 0.7 oz 180 lb 0.6 oz 183 lb  Weight (kg) 81.666 kg 81.666 kg 83.008 kg      Telemetry    Normal sinus rhythm . - Personally Reviewed  ECG    No new ECG- Personally Reviewed  Physical Exam   GEN: No acute distress.   Neck: No JVD. Cardiac: RRR. No murmurs, rubs, or gallops. Right radial pulse 2+. Respiratory: Clear to auscultation bilaterally. No wheezes, rhonchi, or rales. MS: No lower extremity edema. No deformity. Neuro:  No focal deficits. Psych: Normal affect. Responds appropriately.  Labs    High Sensitivity Troponin:   Recent Labs  Lab 10/10/23 1134 10/10/23 1347 10/13/23 0821   TROPONINIHS 5 5 5      Chemistry Recent Labs  Lab 10/10/23 0925 10/10/23 1134 10/13/23 0430  NA 136 135 133*  K 3.9 3.8 3.8  CL 101 99 101  CO2 28 28 22   GLUCOSE 95 105* 107*  BUN 15 14 9   CREATININE 0.93 0.98 1.13  CALCIUM 9.0 8.9 8.6*  MG  --  1.8  --   PROT 6.9  --  6.5  ALBUMIN 4.2  --  3.3*  AST 19  --  29  ALT 34  --  42  ALKPHOS 52  --  44  BILITOT 0.5  --  0.4  GFRNONAA >60 >60 >60  ANIONGAP 7 8 10     Lipids  Recent Labs  Lab 10/13/23 0430  CHOL 185  TRIG 396*  HDL 36*  LDLCALC 70  CHOLHDL 5.1    Hematology Recent Labs  Lab 10/11/23 0100 10/12/23 0500 10/13/23 0430  WBC 7.9 8.1 7.8  RBC 4.89 4.74 4.78  HGB 14.8 14.3 14.3  HCT 42.3 40.9 40.9  MCV 86.5 86.3 85.6  MCH 30.3 30.2 29.9  MCHC 35.0 35.0 35.0  RDW 13.9 13.7  13.7  PLT 230 213 243   Thyroid  Recent Labs  Lab 10/10/23 0925  TSH 1.821    BNPNo results for input(s): "BNP", "PROBNP" in the last 168 hours.  DDimer No results for input(s): "DDIMER" in the last 168 hours.   Radiology    CARDIAC CATHETERIZATION Result Date: 10/13/2023 Table formatting from the original result was not included. Images from the original result were not included. Left Heart Catheterization 10/13/23: Hemodynamic data: LVEDP 12 mmHg.  No pressure gradient across the aortic valve. Angiographic data: There is diffuse coronary calcification.  Left dominant circulation. LM large-caliber vessel, bifurcates into LAD and CX. LCx: Dominant vessel and a very large caliber vessel giving origin to large OM1, small OM 2, OM 3, large PDA and PL branch distally.  Proximal circumflex has mild 30% stenosis, large OM1 with proximal 30% stenosis. LAD: It is moderately calcified diffusely.  There is diffuse luminal irregularity.  Gives origin to large D1 with a ostial 50 to 60% stenosis.  After the D1 origin, mid LAD has calcific 50% stenosis, mild disease in the mid to distal segment followed by another 50% stenosis in the midsegment.   LAD ends at the apex, D1 and D3 being large vessels with mild disease. RCA: Nondominant, proximal to mid 40% stenosis. Virtual FFR using CathWorx: Proximal LAD 0.84, distal LAD 0.82, lesions are not hemodynamically significant.  Signed   Transient ST elevations on telemetry       Impression and recommendations: With patient having on and off chest pain with radiation to the jaw and ST elevations noted on the telemetry, suspect coronary vasospasm as etiology.  Adding vasodilator therapy would be appropriate for management of his hypertension and possible coronary spasm.    Cardiac Studies   Echocardiogram 10/11/2023: Impressions: 1. Left ventricular ejection fraction, by estimation, is 65 to 70%. The  left ventricle has normal function. The left ventricle has no regional  wall motion abnormalities. Left ventricular diastolic parameters are  consistent with Grade I diastolic  dysfunction (impaired relaxation).   2. Right ventricular systolic function is normal. The right ventricular  size is normal. Tricuspid regurgitation signal is inadequate for assessing  PA pressure.   3. No evidence of mitral valve regurgitation.   4. The aortic valve was not well visualized. Aortic valve regurgitation  is not visualized.   5. The inferior vena cava is normal in size with greater than 50%  respiratory variability, suggesting right atrial pressure of 3 mmHg.    Patient Profile     58 y.o. 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  Assessment & Plan     Chest/ Jaw Pain Possible Coronary Vasospams Patient presented with intermittent chest/ jaw pain that has been going on for the last week but occurring more frequently. EKG showed no acute ischemic changes and high-sensitivity troponin negative x2.  Echo showed normal LV function and no regional wall motion abnormalities. Episodes of jaw/ chest pain during admission have correlated with transient ST elevation noted on telemetry raising concern for coronary spasms. - Currently pain free.  However, patient has had multiple episodes of jaw pain that last a couple of minutes at a time. These episodes continue to correlated with transient ST elevation on telemetry. 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  Presentation suggests coronary vasospasm. -On amlodipine  and Imdur 60mg  daily. Will increase amlodipine to 5mg  daily; he is reporting headache, will decrease Imdur to 30 mg daily - Continue statin.    Non-Sustained VT Patient had one documented episode of NSVT on Kardia mobile device. No recurrence noted on telemetry this admission. - No recurrence. - He was initially started on Lopressor but this was stopped to focus on treatment of vasospasms with nitrates and calcium channel blockers. - Continue to monitor on telemetry.   Hyperlipidemia Lipid panel this admission: Total Cholesterol 185, Triglycerides 396, HDL 36, LDL 70. - Home Lipitor has been increased from 20mg  to 40mg  daily.  - Consider adding Vascepa to help with triglycerides. - Will need repeat lipid panel and LFTs in 6-8 weeks.   History of PE - On chronic anticoagulation with Eliquis    Otherwise, per primary team: - COPD - Non-small cell lung cancer   [facility] will sign off.   Medication Recommendations: Amlodipine 5 mg daily, Imdur 30 mg daily, Eliquis 2.5 mg twice daily, atorvastatin 40 mg daily Other recommendations (labs, testing, etc): None Follow up as an outpatient: Will schedule    For questions or updates, please contact [facility] Please consult [email or web address] for contact info under        Signed, Little Ishikawa, MD  10/14/2023, 9:18 AM

## 2023-10-14 NOTE — Progress Notes (Signed)
 So far, everything is gone pretty well.  He had his cardiac cath yesterday.  He had show there was extensive coronary artery disease.  However, there was not any high-grade blockages that need to be stented.  As such, he is being treated medically.  Again I do not see anything that is related to him having the immunotherapy.  I know that radiation therapy can accelerate coronary artery disease.  However, this usually happens after several years.  Hopefully, he will be going home today.  He feels well.  He did has had some episodes with respect to tachycardia and some jaw pain.  He has had no fever.  He has had no obvious bleeding.  His vital signs show temperature of 97.8.  Pulse 81.  Blood pressure 119/78.  Head and neck exam shows no ocular or oral lesions.  He has no adenopathy in the neck.  Lungs are clear bilaterally.  Cardiac exam regular rate and rhythm.  He has an occasional extra beat.  He has no murmurs.  Abdomen is soft.  Bowel sounds are present.  There is no fluid wave.  There is no palpable liver or spleen tip.  Extremity shows no clubbing, cyanosis or edema.  Neurological exam is nonfocal.  I am glad that he was admitted and had the cardiac cath.  I know that he will be followed closely by Cardiology.  At some point, I would think that if these symptoms continue, that he might need to have bypass surgery.  Hopefully, medical intervention will be able to help with any coronary spasms that he may have.  For right now, we will probably hold his next immunotherapy for couple weeks.  I would like for him to go home and be able to enjoy the weekend just recover from the hospitalization.  I know that he has had incredible care from everybody up on [address].  [name], MD  Proverbs 17:17

## 2023-10-15 ENCOUNTER — Telehealth: Payer: Self-pay

## 2023-10-15 NOTE — Transitions of Care (Post Inpatient/ED Visit) (Signed)
 10/15/2023  Name: Brandon Phillips. MRN: [id number] DOB: Aug 24, 1965  Today's TOC FU Call Status: Today's TOC FU Call Status:: Successful TOC FU Call Completed TOC FU Call Complete Date: 10/15/23 Patient's Name and Date of Birth confirmed.  Transition Care Management Follow-up Telephone Call Date of Discharge: 10/14/23 Discharge Facility: [facility]) Type of Discharge: Inpatient Admission Primary Inpatient Discharge Diagnosis:: Unstable angina How have you been since you were released from the hospital?: Better Any questions or concerns?: No  Items Reviewed: Did you receive and understand the discharge instructions provided?: Yes Medications obtained,verified, and reconciled?: Yes (Medications Reviewed) Any new allergies since your discharge?: No Dietary orders reviewed?: NA (education provided on healthy diet) Do you have support at home?: Yes People in Home: spouse Name of Support/Comfort Primary Source: Brandon Phillips  Medications Reviewed Today: Medications Reviewed Today     Reviewed by Jessy Oto, RN (Registered Nurse) on 10/15/23 at [phone]  Med List Status: <None>   Medication Order Taking? Sig Documenting Provider Last Dose Status Informant  albuterol (PROVENTIL) (2.5 MG/3ML) 0.083% nebulizer solution [id number] Yes Take 3 mLs (2.5 mg total) by nebulization every 6 (six) hours as needed for wheezing or shortness of breath. Brandon Chapel, NP Taking Active Spouse/Significant Other, Pharmacy Records  albuterol (VENTOLIN HFA) 108 (90 Base) MCG/ACT inhaler [id number] Yes Inhale 2 puffs into the lungs every 6 (six) hours as needed for wheezing. Brandon Butter, NP Taking Active Spouse/Significant Other, Pharmacy Records  amLODipine (NORVASC) 5 MG tablet [id number] Yes Take 1 tablet (5 mg total) by mouth daily. Hughie Closs, MD Taking Active   apixaban (ELIQUIS) 2.5 MG TABS tablet [id number] Yes Take 1 tablet (2.5 mg total) by mouth 2 (two) times daily. Brandon Macho, MD  Taking Active Spouse/Significant Other, Pharmacy Records  aspirin EC 81 MG tablet [id number] Yes Take 1 tablet (81 mg total) by mouth daily. Swallow whole. Hughie Closs, MD Taking Active   atorvastatin (LIPITOR) 40 MG tablet [id number] Yes Take 1 tablet (40 mg total) by mouth daily. Hughie Closs, MD Taking Active   Budeson-Glycopyrrol-Formoterol (BREZTRI AEROSPHERE) 160-9-4.8 MCG/ACT Sandrea Matte [id number] Yes Inhale 2 puffs into the lungs in the morning and at bedtime. [name], MD Taking Active Spouse/Significant Other, Pharmacy Records  COENZYME Q10 PO [id number] Yes Take 100 mg by mouth in the morning. [provider] Taking Active Spouse/Significant Other, Pharmacy Records  folic acid (FOLVITE) 1 MG tablet [id number] Yes TAKE 1 TABLET BY MOUTH DAILY. START 7 DAYS BEFORE PEMETREXED CHEMOTHERAPY, CONTINUE UNTIL 21 DAYS AFTER PEMETREXED COMPLETED Brandon Phillips, Brandon Phi, MD Taking Active   gabapentin (NEURONTIN) 300 MG capsule [id number] Yes Take 1 capsule (300 mg total) by mouth 2 (two) times daily. TAKE 1 CAPSULE BY MOUTH EVERY NIGHT AT BEDTIME FOR 3 NIGHTS, THEN INCREASE TO TAKE 1 CAPSULE BY MOUTH 2 TIMES A DAY FOR 1 WEEK, THEN OK TO INCREASE TO TAKE 1 CAPSULE BY MOUTH 3 TIMES A DAY [place], Brandon M, [name] Taking Active Spouse/Significant Other, Pharmacy Records  GUAIFENESIN 1200 PO [id number] Yes Take 1,200 mg by mouth in the morning and at bedtime. [provider] Taking Active Spouse/Significant Other, Pharmacy Records  isosorbide mononitrate (IMDUR) 30 MG 24 hr tablet [id number] Yes Take 1 tablet (30 mg total) by mouth daily. Hughie Closs, MD Taking Active   lidocaine-prilocaine (EMLA) cream [id number] Yes Apply a dime size to port-a-cath 1-2 hours prior to access. Cover with saran wrap. Brandon Macho, MD Taking Active Spouse/Significant Other, Pharmacy Records  Multiple  Vitamin (MULTIVITAMIN) capsule [id number] Yes Take 1 capsule by mouth at bedtime. [provider] Taking  Active Spouse/Significant Other, Pharmacy Records            Home Care and Equipment/Supplies: Were Home Health Services Ordered?: No Any new equipment or medical supplies ordered?: No  Functional Questionnaire: Do you need assistance with bathing/showering or dressing?: No Do you need assistance with meal preparation?: No Do you need assistance with eating?: No Do you have difficulty maintaining continence: No Do you need assistance with getting out of bed/getting out of a chair/moving?: No Do you have difficulty managing or taking your medications?: Yes (wife fills pill box)  Follow up appointments reviewed: PCP Follow-up appointment confirmed?: No (wife will call and schedule) MD Provider Line [phone] Given: No Specialist Hospital Follow-up appointment confirmed?: Yes Date of Specialist follow-up appointment?: 10/24/23 Follow-Up Specialty Provider:: [name] cardiology Do you need transportation to your follow-up appointment?: No Do you understand care options if your condition(s) worsen?: Yes-patient verbalized understanding  SDOH Interventions Today    Flowsheet Row Most Recent Value  SDOH Interventions   Food Insecurity Interventions Intervention Not Indicated  Housing Interventions Intervention Not Indicated  Transportation Interventions Intervention Not Indicated  Utilities Interventions Intervention Not Indicated      Interventions Today    Flowsheet Row Most Recent Value  Chronic Disease   Chronic disease during today's visit Chronic Obstructive Pulmonary Disease (COPD), Other  [patient with recent admission related to angina - cardiac cath 10/13/23 via right wrist reports doing well - history of COPD/lung cancer discussed, PE on Eliquis reviewed bleeding precautions]  Nutrition Interventions   Nutrition Discussed/Reviewed Nutrition Discussed, Decreasing salt  [Reviewed healthy diet including salt in moderation, less fats, fast food, red meat,  fried foods]  Pharmacy Interventions   Pharmacy Dicussed/Reviewed Medications and their functions       TOC Interventions Today    Flowsheet Row Most Recent Value  TOC Interventions   TOC Interventions Discussed/Reviewed TOC Interventions Discussed  [discussed TOC program - patient /wife decline and state patient is to see PCP, has appointments with cardiology and oncology and is being closely monitored.]       Patient and wife of 35 years participated in [facility] call. Both report patient doing better. Wife will call PCP to schedule hospital follow up. Wife and patient state patient is also followed by cardiology, pulmonary, and oncology and denied need for TOC 30 day program but accepted [facility] RN contact information and agreed to call with any questions or concerns.   Hilbert Odor RN, CCM [place]  VBCI-Population Health RN Care Manager [phone]

## 2023-10-16 ENCOUNTER — Ambulatory Visit (INDEPENDENT_AMBULATORY_CARE_PROVIDER_SITE_OTHER): Admitting: Medical-Surgical

## 2023-10-16 ENCOUNTER — Encounter: Payer: Self-pay | Admitting: Medical-Surgical

## 2023-10-16 VITALS — BP 103/66 | HR 87 | Resp 20 | Ht 65.0 in | Wt 176.1 lb

## 2023-10-16 DIAGNOSIS — Z09 Encounter for follow-up examination after completed treatment for conditions other than malignant neoplasm: Secondary | ICD-10-CM

## 2023-10-16 DIAGNOSIS — I201 Angina pectoris with documented spasm: Secondary | ICD-10-CM | POA: Diagnosis not present

## 2023-10-16 DIAGNOSIS — E871 Hypo-osmolality and hyponatremia: Secondary | ICD-10-CM

## 2023-10-16 DIAGNOSIS — Z1211 Encounter for screening for malignant neoplasm of colon: Secondary | ICD-10-CM

## 2023-10-16 DIAGNOSIS — I10 Essential (primary) hypertension: Secondary | ICD-10-CM

## 2023-10-16 DIAGNOSIS — L989 Disorder of the skin and subcutaneous tissue, unspecified: Secondary | ICD-10-CM

## 2023-10-16 DIAGNOSIS — E781 Pure hyperglyceridemia: Secondary | ICD-10-CM

## 2023-10-16 DIAGNOSIS — R21 Rash and other nonspecific skin eruption: Secondary | ICD-10-CM

## 2023-10-16 MED ORDER — TRIAMCINOLONE ACETONIDE 0.1 % EX CREA: 1.0000 | TOPICAL_CREAM | Freq: Two times a day (BID) | CUTANEOUS | 3 refills | Status: AC

## 2023-10-16 NOTE — Progress Notes (Unsigned)
   Established patient visit  History, exam, impression, and plan:  1. Cough, unspecified type 2. Sore throat Pleasant 58 year old male presenting today with reports of upper respiratory symptoms.  Notes that this started approximately 5 days ago with her nose and eyes burning.  On Saturday, she felt unwell and by Sunday she notes that she felt awful.  Her throat has been sore and she has been coughing.  Notes that her cough has been occasionally productive of small amounts of pink-tinged sputum, usually in the morning.  Her left ear has now developed pressure/discomfort and she has significant sinus congestion.  She has tried drinking hot tea and increasing her fluid consumption.  She has also used Chloraseptic for the sore throat.  Continues to have significant issues with hoarseness.  She saw ENT and they recommended just using Atrovent nasal spray twice daily and follow-up with them after a couple of months.  POCT strep, flu, and COVID testing all negative today.  Below for physical exam. - POCT rapid strep A - POCT Influenza A/B - POC COVID-19  3. Viral URI with cough Despite negative testing, suspect that her symptoms are truly related to a viral URI.  Discussed the timeline for resolution of a viral illness since symptoms can last 7 to 14 days.  With her severe hoarseness and significant sinus congestion, treating with Decadron 4 mg twice daily.  Adding Tussionex for cough suppression.  Okay to use Tylenol/ibuprofen for fever/discomfort.  Continue conservative measures at home.  If improvement in symptoms not noted over the next 2 to 3 days or symptoms improved but quickly worsen again, consider adding antibiotic therapy for secondary bacterial infection.   Procedures performed this visit: None.  Return if symptoms worsen or fail to improve.  __________________________________ Thayer Ohm, DNP, APRN, FNP-BC Primary Care and Sports Medicine [facility] [place]

## 2023-10-17 ENCOUNTER — Encounter: Payer: Self-pay | Admitting: Hematology & Oncology

## 2023-10-17 ENCOUNTER — Encounter: Payer: Self-pay | Admitting: Medical-Surgical

## 2023-10-17 LAB — CBC
Hematocrit: 43.6 % (ref 37.5–51.0)
Hemoglobin: 14.9 g/dL (ref 13.0–17.7)
MCH: 30.2 pg (ref 26.6–33.0)
MCHC: 34.2 g/dL (ref 31.5–35.7)
MCV: 88 fL (ref 79–97)
Platelets: 281 10*3/uL (ref 150–450)
RBC: 4.94 x10E6/uL (ref 4.14–5.80)
RDW: 13.9 % (ref 11.6–15.4)
WBC: 9.4 10*3/uL (ref 3.4–10.8)

## 2023-10-17 LAB — LIPID PANEL
Chol/HDL Ratio: 4.9 ratio (ref 0.0–5.0)
Cholesterol, Total: 206 mg/dL — ABNORMAL HIGH (ref 100–199)
HDL: 42 mg/dL (ref >39)
LDL Chol Calc (NIH): 118 mg/dL — ABNORMAL HIGH (ref 0–99)
Triglycerides: 266 mg/dL — ABNORMAL HIGH (ref 0–149)
VLDL Cholesterol Cal: 46 mg/dL — ABNORMAL HIGH (ref 5–40)

## 2023-10-17 LAB — BASIC METABOLIC PANEL
BUN/Creatinine Ratio: 11 (ref 9–20)
BUN: 11 mg/dL (ref 6–24)
CO2: 24 mmol/L (ref 20–29)
Calcium: 9.5 mg/dL (ref 8.7–10.2)
Chloride: 98 mmol/L (ref 96–106)
Creatinine, Ser: 0.97 mg/dL (ref 0.76–1.27)
Glucose: 83 mg/dL (ref 70–99)
Potassium: 4.5 mmol/L (ref 3.5–5.2)
Sodium: 137 mmol/L (ref 134–144)
eGFR: 92 mL/min/{1.73_m2} (ref >59)

## 2023-10-18 DIAGNOSIS — E785 Hyperlipidemia, unspecified: Secondary | ICD-10-CM | POA: Diagnosis not present

## 2023-10-18 DIAGNOSIS — R490 Dysphonia: Secondary | ICD-10-CM | POA: Diagnosis not present

## 2023-10-18 DIAGNOSIS — J449 Chronic obstructive pulmonary disease, unspecified: Secondary | ICD-10-CM | POA: Diagnosis not present

## 2023-10-20 ENCOUNTER — Other Ambulatory Visit: Payer: Self-pay

## 2023-10-20 MED ORDER — GABAPENTIN 300 MG PO CAPS: 300.0000 mg | ORAL_CAPSULE | Freq: Three times a day (TID) | ORAL | 3 refills | Status: DC

## 2023-10-24 ENCOUNTER — Other Ambulatory Visit: Payer: Self-pay

## 2023-10-24 ENCOUNTER — Ambulatory Visit: Attending: Nurse Practitioner | Admitting: Nurse Practitioner

## 2023-10-24 ENCOUNTER — Encounter: Payer: Self-pay | Admitting: Nurse Practitioner

## 2023-10-24 VITALS — BP 116/78 | HR 67 | Ht 65.0 in | Wt 185.0 lb

## 2023-10-24 DIAGNOSIS — Z86711 Personal history of pulmonary embolism: Secondary | ICD-10-CM

## 2023-10-24 DIAGNOSIS — C3432 Malignant neoplasm of lower lobe, left bronchus or lung: Secondary | ICD-10-CM

## 2023-10-24 DIAGNOSIS — I4729 Other ventricular tachycardia: Secondary | ICD-10-CM

## 2023-10-24 DIAGNOSIS — I201 Angina pectoris with documented spasm: Secondary | ICD-10-CM

## 2023-10-24 DIAGNOSIS — I2510 Atherosclerotic heart disease of native coronary artery without angina pectoris: Secondary | ICD-10-CM

## 2023-10-24 MED ORDER — APIXABAN 2.5 MG PO TABS: 2.5000 mg | ORAL_TABLET | Freq: Two times a day (BID) | ORAL | 3 refills | Status: AC

## 2023-10-24 MED ORDER — ATORVASTATIN CALCIUM 40 MG PO TABS: 40.0000 mg | ORAL_TABLET | Freq: Every day | ORAL | 3 refills | Status: DC

## 2023-10-24 MED ORDER — ISOSORBIDE MONONITRATE ER 30 MG PO TB24: 30.0000 mg | ORAL_TABLET | Freq: Every day | ORAL | 3 refills | Status: DC

## 2023-10-24 MED ORDER — ASPIRIN 81 MG PO TBEC: 81.0000 mg | DELAYED_RELEASE_TABLET | Freq: Every day | ORAL | 3 refills | Status: AC

## 2023-10-24 MED ORDER — AMLODIPINE BESYLATE 5 MG PO TABS: 5.0000 mg | ORAL_TABLET | Freq: Every day | ORAL | 3 refills | Status: DC

## 2023-10-24 NOTE — Progress Notes (Addendum)
 Office Visit    Patient Name: Brandon Phillips. 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  Past Medical History    Past Medical History:  Diagnosis Date   Cancer (HCC)    lung cancer   CAP (community acquired pneumonia)    COPD (chronic obstructive pulmonary disease) (HCC)    Eczema    HLD (hyperlipidemia)    Primary cancer of left lower lobe of lung (HCC) 09/17/2022   Tobacco use disorder 01/24/2017   Past Surgical History:  Procedure Laterality Date   BRONCHIAL BIOPSY  08/29/2022   Procedure: BRONCHIAL BIOPSIES;  Surgeon: [name], MD;  Location: [facility] ENDOSCOPY;  Service: Pulmonary;;   BRONCHIAL BRUSHINGS  08/29/2022   Procedure: BRONCHIAL BRUSHINGS;  Surgeon: [name], MD;  Location: [facility] ENDOSCOPY;  Service: Pulmonary;;   BRONCHIAL NEEDLE ASPIRATION BIOPSY  08/29/2022   Procedure: BRONCHIAL NEEDLE ASPIRATION BIOPSIES;  Surgeon: [name], MD;  Location: [facility] ENDOSCOPY;  Service: Pulmonary;;   BRONCHIAL WASHINGS  08/29/2022   Procedure: BRONCHIAL WASHINGS;  Surgeon: [name], MD;  Location: [facility] ENDOSCOPY;  Service: Pulmonary;;   CORONARY PRESSURE/FFR WITH 3D MAPPING N/A 10/13/2023   Procedure: Coronary Pressure/FFR w/3D Mapping;  Surgeon: Yates Decamp, MD;  Location: [facility] INVASIVE CV LAB;  Service: Cardiovascular;  Laterality: N/A;   FIDUCIAL MARKER PLACEMENT  08/29/2022   Procedure: FIDUCIAL MARKER PLACEMENT;  Surgeon: [name], MD;  Location: [facility] ENDOSCOPY;  Service: Pulmonary;;   HERNIA REPAIR     as an infant   IR IMAGING GUIDED PORT INSERTION  10/03/2022   IR RADIOLOGIST  EVAL & MGMT  10/21/2022   LEFT HEART CATH AND CORONARY ANGIOGRAPHY N/A 10/13/2023   Procedure: LEFT HEART CATH AND CORONARY ANGIOGRAPHY;  Surgeon: Yates Decamp, MD;  Location: [facility] INVASIVE CV LAB;  Service: Cardiovascular;  Laterality: N/A;   VIDEO BRONCHOSCOPY WITH ENDOBRONCHIAL ULTRASOUND  08/29/2022   Procedure: VIDEO BRONCHOSCOPY WITH ENDOBRONCHIAL ULTRASOUND;  Surgeon: [name], MD;  Location: [facility] ENDOSCOPY;  Service: Pulmonary;;    Allergies  Allergies  Allergen Reactions   Imdur [Isosorbide Nitrate] Other (See Comments)    Headache     Labs/Other Studies Reviewed    The following studies were reviewed today:  Cardiac Studies & Procedures   ______________________________________________________________________________________________ CARDIAC CATHETERIZATION  CARDIAC CATHETERIZATION 10/13/2023  Narrative Table formatting from the original result was not included. Images from the original result were not included. Left Heart Catheterization 10/13/23: Hemodynamic data: LVEDP 12 mmHg.  No pressure gradient across the aortic valve.  Angiographic data: There is diffuse coronary calcification.  Left dominant circulation. LM large-caliber vessel, bifurcates into LAD and CX. LCx: Dominant vessel and a very large caliber vessel giving origin to large OM1, small OM 2, OM 3, large PDA and PL branch distally.  Proximal circumflex has mild 30% stenosis, large OM1 with proximal 30% stenosis. LAD: It is moderately calcified diffusely.  There is diffuse luminal irregularity.  Gives origin to large D1 with a ostial 50 to 60% stenosis.  After the D1 origin, mid LAD has calcific 50% stenosis, mild disease in the mid to distal segment followed by another 50% stenosis in the  midsegment.  LAD ends at the apex, D1 and D3 being large vessels with mild disease. RCA: Nondominant, proximal to mid 40% stenosis.  Virtual FFR using CathWorx: Proximal LAD 0.84, distal LAD 0.82, lesions are not  hemodynamically significant.     Signed   Transient ST elevations on telemetry        Impression and recommendations: With patient having on and off chest pain with radiation to the jaw and ST elevations noted on the telemetry, suspect coronary vasospasm as etiology.  Adding vasodilator therapy would be appropriate for management of his hypertension and possible coronary spasm.  Findings Coronary Findings Diagnostic  Dominance: Left  Left Anterior Descending Mid LAD lesion is 50% stenosed. Dist LAD lesion is 50% stenosed.  First Diagonal Branch 1st Diag lesion is 50% stenosed.  Left Circumflex Mid Cx to Dist Cx lesion is 30% stenosed.  First Obtuse Marginal Branch 1st Mrg lesion is 30% stenosed.  Right Coronary Artery Prox RCA lesion is 40% stenosed.  Intervention  No interventions have been documented.     ECHOCARDIOGRAM  ECHOCARDIOGRAM COMPLETE 10/11/2023  Narrative ECHOCARDIOGRAM REPORT    Patient Name:   Brandon Phillips. Date of Exam: 10/11/2023 Medical Rec #:  [id number]              Height:       65.0 in Accession #:    [id number]             Weight:       180.0 lb Date of Birth:  1965-10-29             BSA:          1.892 m Patient Age:    57 years               BP:           121/90 mmHg Patient Gender: M                      HR:           73 bpm. Exam Location:  Inpatient  Procedure: 2D Echo, Cardiac Doppler and Color Doppler (Both Spectral and Color Flow Doppler were utilized during procedure).  Indications:    Chest Pain R07.9  History:        Patient has prior history of Echocardiogram examinations, most recent 09/10/2022. Risk Factors:Hypertension, Diabetes and Dyslipidemia. Hx of lung cancer.  Sonographer:    Celesta Gentile RCS Referring Phys: [id number] SARA-MAIZ A THOMAS  IMPRESSIONS   1. Left ventricular ejection fraction, by estimation, is 65 to 70%. The left ventricle has normal function. The left ventricle has no regional  wall motion abnormalities. Left ventricular diastolic parameters are consistent with Grade I diastolic dysfunction (impaired relaxation). 2. Right ventricular systolic function is normal. The right ventricular size is normal. Tricuspid regurgitation signal is inadequate for assessing PA pressure. 3. No evidence of mitral valve regurgitation. 4. The aortic valve was not well visualized. Aortic valve regurgitation is not visualized. 5. The inferior vena cava is normal in size with greater than 50% respiratory variability, suggesting right atrial pressure of 3 mmHg.  FINDINGS Left Ventricle: Left ventricular ejection fraction, by estimation, is 65 to 70%. The left ventricle has normal function. The left ventricle has no regional wall motion abnormalities. The left ventricular internal cavity size was normal in size. There is no left ventricular hypertrophy. Left ventricular diastolic parameters are consistent with Grade I diastolic dysfunction (impaired relaxation).  Right Ventricle: The right ventricular size is normal. Right ventricular systolic function is normal. Tricuspid regurgitation signal is inadequate for assessing PA pressure.  Left Atrium: Left atrial size was normal in size.  Right Atrium: Right atrial size was normal in size.  Pericardium: There is no evidence of pericardial effusion.  Mitral Valve: No evidence of mitral valve regurgitation.  Tricuspid Valve: Tricuspid valve regurgitation is not demonstrated.  Aortic Valve: The aortic valve was not well visualized. Aortic valve regurgitation is not visualized. Aortic valve mean gradient measures 11.0 mmHg. Aortic valve peak gradient measures 21.3 mmHg. Aortic valve area, by VTI measures 2.67 cm.  Pulmonic Valve: The pulmonic valve was not well visualized. Pulmonic valve regurgitation is not visualized.  Aorta: The aortic root and ascending aorta are structurally normal, with no evidence of dilitation.  Venous: The inferior  vena cava is normal in size with greater than 50% respiratory variability, suggesting right atrial pressure of 3 mmHg.  IAS/Shunts: The interatrial septum was not well visualized.   LEFT VENTRICLE PLAX 2D LVIDd:         4.20 cm   Diastology LVIDs:         2.20 cm   LV e' medial:    9.79 cm/s LV PW:         0.90 cm   LV E/e' medial:  6.5 LV IVS:        0.90 cm   LV e' lateral:   14.30 cm/s LVOT diam:     1.80 cm   LV E/e' lateral: 4.5 LV SV:         80 LV SV Index:   42 LVOT Area:     2.54 cm   RIGHT VENTRICLE RV S prime:     19.00 cm/s TAPSE (M-mode): 2.0 cm  LEFT ATRIUM             Index        RIGHT ATRIUM           Index LA diam:        2.80 cm 1.48 cm/m   RA Area:     11.00 cm LA Vol (A2C):   44.1 ml 23.31 ml/m  RA Volume:   21.40 ml  11.31 ml/m LA Vol (A4C):   28.0 ml 14.80 ml/m LA Biplane Vol: 36.6 ml 19.35 ml/m AORTIC VALVE AV Area (Vmax):    2.24 cm AV Area (Vmean):   2.27 cm AV Area (VTI):     2.67 cm AV Vmax:           231.00 cm/s AV Vmean:          150.000 cm/s AV VTI:            0.299 m AV Peak Grad:      21.3 mmHg AV Mean Grad:      11.0 mmHg LVOT Vmax:         203.00 cm/s LVOT Vmean:        134.000 cm/s LVOT VTI:          0.314 m LVOT/AV VTI ratio: 1.05  AORTA Ao Root diam: 3.50 cm  MITRAL VALVE MV Area (PHT): 2.50 cm     SHUNTS MV Decel Time: 303 msec     Systemic VTI:  0.31 m MV E velocity: 63.70 cm/s   Systemic Diam: 1.80 cm MV A velocity: 119.00 cm/s MV E/A ratio:  0.54  Photographer signed by Carolan Clines Signature Date/Time: 10/11/2023/4:13:58 PM  Final          ______________________________________________________________________________________________     Recent Labs: 02/17/2023: B Natriuretic Peptide 87.1 10/10/2023: Magnesium 1.8; TSH 1.821 10/13/2023: ALT 42 10/16/2023: BUN 11; Creatinine, Ser 0.97; Hemoglobin 14.9; Platelets 281; Potassium 4.5; Sodium 137  Recent Lipid Panel    Component Value  Date/Time   CHOL 206 (H) 10/16/2023 1206   TRIG 266 (H) 10/16/2023 1206   HDL 42 10/16/2023 1206   CHOLHDL 4.9 10/16/2023 1206   CHOLHDL 5.1 10/13/2023 0430   VLDL 79 (H) 10/13/2023 0430   LDLCALC 118 (H) 10/16/2023 1206   LDLCALC 88 01/11/2022 0000    History of Present Illness    58 year old male with the above past medical history including nonobstructive CAD, 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  He presented to the ED on 10/10/2023 in the setting of intermittent chest/jaw pain x 1 week.  Initial EKG showed no acute changes, troponin was negative x 2.  Echocardiogram showed normal LV function, EF 65 to 70% no RWMA, G1 DD, normal RV systolic function, no significant valvular abnormalities.  He was noted to have transient ST elevation on telemetry during episodes of jaw/chest pain.  He was started on amlodipine and Imdur.  He also had 1 documented episode of NSVT on his home Kardia mobile device.  He was initially started on metoprolol, however this was discontinued to allow for treatment of vasospasm.  Cardiac catheterization revealed nonobstructive CAD.  His home Lipitor dose was increased from 20 mg to 40 mg daily.  He was discharged home in stable condition on 10/14/2023.  He presents today for follow-up accompanied by his wife.  Since his hospitalization he has done well from a cardiac standpoint.  He denies any recurrent chest pain, denies dyspnea, palpitations, dizziness, PND, orthopnea, weight gain.  BP has been stable.  Overall, he reports feeling well.   Home Medications    Current Outpatient Medications  Medication Sig Dispense Refill   albuterol (PROVENTIL) (2.5 MG/3ML) 0.083% nebulizer solution Take 3 mLs (2.5 mg total) by nebulization every 6 (six) hours as needed for wheezing or shortness of breath. 75 mL 5   albuterol (VENTOLIN HFA) 108 (90 Base) MCG/ACT inhaler Inhale 2  puffs into the lungs every 6 (six) hours as needed for wheezing. 2 each 11   amLODipine (NORVASC) 5 MG tablet Take 1 tablet (5 mg total) by mouth daily. 30 tablet 0   apixaban (ELIQUIS) 2.5 MG TABS tablet Take 1 tablet (2.5 mg total) by mouth 2 (two) times daily. 60 tablet 12   aspirin EC 81 MG tablet Take 1 tablet (81 mg total) by mouth daily. Swallow whole. 30 tablet 0   atorvastatin (LIPITOR) 40 MG tablet Take 1 tablet (40 mg total) by mouth daily. 30 tablet 0   Budeson-Glycopyrrol-Formoterol (BREZTRI AEROSPHERE) 160-9-4.8 MCG/ACT AERO Inhale 2 puffs into the lungs in the morning and at bedtime. 1 each 11   COENZYME Q10 PO Take 100 mg by mouth in the morning.     folic acid (FOLVITE) 1 MG tablet TAKE 1 TABLET BY MOUTH DAILY. START 7 DAYS BEFORE PEMETREXED CHEMOTHERAPY, CONTINUE UNTIL 21 DAYS AFTER PEMETREXED COMPLETED 30 tablet 8   gabapentin (NEURONTIN) 300 MG capsule Take 1 capsule (300 mg total) by mouth 3 (three) times daily. 90 capsule 3   GUAIFENESIN 1200 PO Take 1,200 mg by mouth in the morning and at bedtime.  isosorbide mononitrate (IMDUR) 30 MG 24 hr tablet Take 1 tablet (30 mg total) by mouth daily. 30 tablet 0   lidocaine-prilocaine (EMLA) cream Apply a dime size to port-a-cath 1-2 hours prior to access. Cover with saran wrap. 30 g 2   Multiple Vitamin (MULTIVITAMIN) capsule Take 1 capsule by mouth at bedtime.     triamcinolone cream (KENALOG) 0.1 % Apply 1 Application topically 2 (two) times daily. 45 g 3   No current facility-administered medications for this visit.     Review of Systems    He denies chest pain, palpitations, dyspnea, pnd, orthopnea, n, v, dizziness, syncope, edema, weight gain, or early satiety. All other systems reviewed and are otherwise negative except as noted above.   Physical Exam    VS:  BP 116/78   Pulse 67   Ht 5\' 5"  (1.651 m)   Wt 185 lb (83.9 kg)   SpO2 95%   BMI 30.79 kg/m   GEN: Well nourished, well developed, in no acute  distress. HEENT: normal. Neck: Supple, no JVD, carotid bruits, or masses. Cardiac: RRR, no murmurs, rubs, or gallops. No clubbing, cyanosis, edema.  Radials/DP/PT 2+ and equal bilaterally.  Right radial cath site with mild bruising, no bleeding, bruit, or hematoma. Respiratory:  Respirations regular and unlabored, clear to auscultation bilaterally. GI: Soft, nontender, nondistended, BS + x 4. MS: no deformity or atrophy. Skin: warm and dry, no rash. Neuro:  Strength and sensation are intact. Psych: Normal affect.  Accessory Clinical Findings    ECG personally reviewed by me today - EKG Interpretation Date/Time:  Friday October 24 2023 10:09:52 EDT Ventricular Rate:  87 PR Interval:  154 QRS Duration:  68 QT Interval:  332 QTC Calculation: 399 R Axis:   75  Text Interpretation: Normal sinus rhythm Nonspecific ST abnormality When compared with ECG of 13-Oct-2023 06:26, Criteria for Septal infarct are no longer Present Confirmed by [name] ([id number]) on 10/24/2023 10:14:07 AM  - no acute changes.   Lab Results  Component Value Date   WBC 9.4 10/16/2023   HGB 14.9 10/16/2023   HCT 43.6 10/16/2023   MCV 88 10/16/2023   PLT 281 10/16/2023   Lab Results  Component Value Date   CREATININE 0.97 10/16/2023   BUN 11 10/16/2023   NA 137 10/16/2023   K 4.5 10/16/2023   CL 98 10/16/2023   CO2 24 10/16/2023   Lab Results  Component Value Date   ALT 42 10/13/2023   AST 29 10/13/2023   ALKPHOS 44 10/13/2023   BILITOT 0.4 10/13/2023   Lab Results  Component Value Date   CHOL 206 (H) 10/16/2023   HDL 42 10/16/2023   LDLCALC 118 (H) 10/16/2023   TRIG 266 (H) 10/16/2023   CHOLHDL 4.9 10/16/2023    Lab Results  Component Value Date   HGBA1C 6.2 (H) 10/12/2023    Assessment & Plan    1. Nonobstructive CAD/coronary artery vasospasm:  Transient ST elevation on telemetry during episodes of jaw/chest pain. Cardiac catheterization revealed nonobstructive CAD.  Echocardiogram showed  normal LV function, EF 65 to 70% no RWMA, G1 DD, normal RV systolic function, no significant valvular abnormalities.  He was started on amlodipine and Imdur.  He denies any further symptoms concerning for angina.  Continue aspirin, amlodipine, Imdur, and Lipitor.  2. NSVT: One prior documented episode on home Kardia mobile device, no recurrence.  He denies any palpitations.  Stable off beta-blocker (metoprolol was discontinued to allow for treatment of vasospasm as  above).   3. Hyperlipidemia: LDL was 118 in 09/2023. Home Lipitor dose was recently increased from 20 to 40 mg daily.  Will repeat fasting lipids, CMET in 6 to 8 weeks. Continue Lipitor.   4. History of PE: Continue Eliquis.   5. History of lung cancer:  S/p chemo/radiation.  Following with oncology.   6. Disposition: Follow-up in 4 to 6 months with Dr. Royann Shivers.      Joylene Grapes, NP 10/24/2023, 10:32 AM

## 2023-10-24 NOTE — Patient Instructions (Signed)
 Medication Instructions:  Your physician recommends that you continue on your current medications as directed. Please refer to the Current Medication list given to you today.  *If you need a refill on your cardiac medications before your next appointment, please call your pharmacy*  Lab Work: Fasting Lipid panel & CMET in 6-8 weeks.  Testing/Procedures: NONE ordered at this time of appointment   Follow-Up: At [facility], you and your health needs are our priority.  As part of our continuing mission to provide you with exceptional heart care, our providers are all part of one team.  This team includes your primary Cardiologist (physician) and Advanced Practice Providers or APPs (Physician Assistants and Nurse Practitioners) who all work together to provide you with the care you need, when you need it.  Your next appointment:   6 month(s)  Provider:   Thurmon Fair, MD     We recommend signing up for the patient portal called "MyChart".  Sign up information is provided on this After Visit Summary.  MyChart is used to connect with patients for Virtual Visits (Telemedicine).  Patients are able to view lab/test results, encounter notes, upcoming appointments, etc.  Non-urgent messages can be sent to your provider as well.   To learn more about what you can do with MyChart, go to [email or web address].   Other Instructions       1st Floor: - Lobby - Registration  - Pharmacy  - Lab - Cafe  2nd Floor: - PV Lab - Diagnostic Testing (echo, CT, nuclear med)  3rd Floor: - Vacant  4th Floor: - TCTS (cardiothoracic surgery) - AFib Clinic - Structural Heart Clinic - Vascular Surgery  - Vascular Ultrasound  5th Floor: - HeartCare Cardiology (general and EP) - Clinical Pharmacy for coumadin, hypertension, lipid, weight-loss medications, and med management appointments    Valet parking services will be available as well.

## 2023-10-25 ENCOUNTER — Other Ambulatory Visit: Payer: Self-pay

## 2023-10-25 DIAGNOSIS — R490 Dysphonia: Secondary | ICD-10-CM | POA: Diagnosis not present

## 2023-10-25 DIAGNOSIS — J449 Chronic obstructive pulmonary disease, unspecified: Secondary | ICD-10-CM | POA: Diagnosis not present

## 2023-10-25 DIAGNOSIS — E785 Hyperlipidemia, unspecified: Secondary | ICD-10-CM | POA: Diagnosis not present

## 2023-10-26 ENCOUNTER — Encounter: Payer: Self-pay | Admitting: Nurse Practitioner

## 2023-10-31 ENCOUNTER — Inpatient Hospital Stay (HOSPITAL_BASED_OUTPATIENT_CLINIC_OR_DEPARTMENT_OTHER): Admitting: Hematology & Oncology

## 2023-10-31 ENCOUNTER — Inpatient Hospital Stay: Attending: Hematology & Oncology

## 2023-10-31 ENCOUNTER — Inpatient Hospital Stay

## 2023-10-31 ENCOUNTER — Encounter: Payer: Self-pay | Admitting: Hematology & Oncology

## 2023-10-31 VITALS — BP 105/81 | HR 71 | Temp 97.9°F | Resp 20 | Ht 65.0 in | Wt 178.0 lb

## 2023-10-31 VITALS — BP 116/77 | HR 68 | Resp 17

## 2023-10-31 DIAGNOSIS — Z5112 Encounter for antineoplastic immunotherapy: Secondary | ICD-10-CM | POA: Insufficient documentation

## 2023-10-31 DIAGNOSIS — Z79620 Long term (current) use of immunosuppressive biologic: Secondary | ICD-10-CM | POA: Insufficient documentation

## 2023-10-31 DIAGNOSIS — Z87891 Personal history of nicotine dependence: Secondary | ICD-10-CM | POA: Diagnosis not present

## 2023-10-31 DIAGNOSIS — C3432 Malignant neoplasm of lower lobe, left bronchus or lung: Secondary | ICD-10-CM | POA: Diagnosis not present

## 2023-10-31 LAB — CBC WITH DIFFERENTIAL (CANCER CENTER ONLY)
Abs Immature Granulocytes: 0.02 10*3/uL (ref 0.00–0.07)
Basophils Absolute: 0.1 10*3/uL (ref 0.0–0.1)
Basophils Relative: 1 %
Eosinophils Absolute: 0.1 10*3/uL (ref 0.0–0.5)
Eosinophils Relative: 2 %
HCT: 42.1 % (ref 39.0–52.0)
Hemoglobin: 14.4 g/dL (ref 13.0–17.0)
Immature Granulocytes: 0 %
Lymphocytes Relative: 11 %
Lymphs Abs: 0.7 10*3/uL (ref 0.7–4.0)
MCH: 30.2 pg (ref 26.0–34.0)
MCHC: 34.2 g/dL (ref 30.0–36.0)
MCV: 88.3 fL (ref 80.0–100.0)
Monocytes Absolute: 0.6 10*3/uL (ref 0.1–1.0)
Monocytes Relative: 10 %
Neutro Abs: 4.8 10*3/uL (ref 1.7–7.7)
Neutrophils Relative %: 76 %
Platelet Count: 243 10*3/uL (ref 150–400)
RBC: 4.77 MIL/uL (ref 4.22–5.81)
RDW: 13.9 % (ref 11.5–15.5)
WBC Count: 6.4 10*3/uL (ref 4.0–10.5)
nRBC: 0 % (ref 0.0–0.2)

## 2023-10-31 LAB — CMP (CANCER CENTER ONLY)
ALT: 35 U/L (ref 0–44)
AST: 18 U/L (ref 15–41)
Albumin: 4.3 g/dL (ref 3.5–5.0)
Alkaline Phosphatase: 47 U/L (ref 38–126)
Anion gap: 9 (ref 5–15)
BUN: 14 mg/dL (ref 6–20)
CO2: 28 mmol/L (ref 22–32)
Calcium: 8.9 mg/dL (ref 8.9–10.3)
Chloride: 101 mmol/L (ref 98–111)
Creatinine: 1.01 mg/dL (ref 0.61–1.24)
GFR, Estimated: >60 mL/min (ref >60)
Glucose, Bld: 94 mg/dL (ref 70–99)
Potassium: 4 mmol/L (ref 3.5–5.1)
Sodium: 138 mmol/L (ref 135–145)
Total Bilirubin: 0.5 mg/dL (ref 0.0–1.2)
Total Protein: 7 g/dL (ref 6.5–8.1)

## 2023-10-31 LAB — TSH: TSH: 1.683 u[IU]/mL (ref 0.350–4.500)

## 2023-10-31 LAB — LACTATE DEHYDROGENASE: LDH: 148 U/L (ref 98–192)

## 2023-10-31 MED ORDER — SODIUM CHLORIDE 0.9 % IV SOLN
1500.0000 mg | Freq: Once | INTRAVENOUS | Status: AC
  Administered 2023-10-31: 1500 mg via INTRAVENOUS
  Filled 2023-10-31: qty 30

## 2023-10-31 MED ORDER — SODIUM CHLORIDE 0.9 % IV SOLN: Freq: Once | INTRAVENOUS | Status: AC

## 2023-10-31 MED ORDER — HEPARIN SOD (PORK) LOCK FLUSH 100 UNIT/ML IV SOLN
500.0000 [IU] | Freq: Once | INTRAVENOUS | Status: AC | PRN
  Administered 2023-10-31: 500 [IU]

## 2023-10-31 MED ORDER — SODIUM CHLORIDE 0.9% FLUSH
10.0000 mL | INTRAVENOUS | Status: DC | PRN
  Administered 2023-10-31: 10 mL

## 2023-10-31 NOTE — Patient Instructions (Signed)

## 2023-10-31 NOTE — Patient Instructions (Signed)
 [facility] - A DEPT OF [facility]  Discharge Instructions: Thank you for choosing [facility] to provide your oncology and hematology care.   If you have a lab appointment with the Cancer Center, please go directly to the Cancer Center and check in at the registration area.  Wear comfortable clothing and clothing appropriate for easy access to any Portacath or PICC line.   We strive to give you quality time with your provider. You may need to reschedule your appointment if you arrive late (15 or more minutes).  Arriving late affects you and other patients whose appointments are after yours.  Also, if you miss three or more appointments without notifying the office, you may be dismissed from the clinic at the provider's discretion.      For prescription refill requests, have your pharmacy contact our office and allow 72 hours for refills to be completed.    Today you received the following chemotherapy and/or immunotherapy agents:  Imfinzi      To help prevent nausea and vomiting after your treatment, we encourage you to take your nausea medication as directed.  BELOW ARE SYMPTOMS THAT SHOULD BE REPORTED IMMEDIATELY: *FEVER GREATER THAN 100.4 F (38 C) OR HIGHER *CHILLS OR SWEATING *NAUSEA AND VOMITING THAT IS NOT CONTROLLED WITH YOUR NAUSEA MEDICATION *UNUSUAL SHORTNESS OF BREATH *UNUSUAL BRUISING OR BLEEDING *URINARY PROBLEMS (pain or burning when urinating, or frequent urination) *BOWEL PROBLEMS (unusual diarrhea, constipation, pain near the anus) TENDERNESS IN MOUTH AND THROAT WITH OR WITHOUT PRESENCE OF ULCERS (sore throat, sores in mouth, or a toothache) UNUSUAL RASH, SWELLING OR PAIN  UNUSUAL VAGINAL DISCHARGE OR ITCHING   Items with * indicate a potential emergency and should be followed up as soon as possible or go to the Emergency Department if any problems should occur.  Please show the CHEMOTHERAPY ALERT CARD or IMMUNOTHERAPY  ALERT CARD at check-in to the Emergency Department and triage nurse. Should you have questions after your visit or need to cancel or reschedule your appointment, please contact [facility] - A DEPT OF [facility]  [phone] and follow the prompts.  Office hours are 8:00 a.m. to 4:30 p.m. Monday - Friday. Please note that voicemails left after 4:00 p.m. may not be returned until the following business day.  We are closed weekends and major holidays. You have access to a nurse at all times for urgent questions. Please call the main number to the clinic [phone] and follow the prompts.  For any non-urgent questions, you may also contact your provider using MyChart. We now offer e-Visits for anyone 36 and older to request care online for non-urgent symptoms. For details visit [email or web address].   Also download the MyChart app! Go to the app store, search "MyChart", open the app, select [place], and log in with your MyChart username and password.

## 2023-10-31 NOTE — Progress Notes (Signed)
 Ok to resume Imfinzi today per MD.  Richardean Sale, RPH, BCPS, BCOP 10/31/2023 10:01 AM

## 2023-10-31 NOTE — Progress Notes (Signed)
 [facility] Hematology and Oncology Follow Up Visit  Brandon Phillips [id number] [birth year] 58 y.o. 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 twice daily-start on 12/06/2022 -changed to 2.5 mg p.o. twice daily on 05/12/2023 Status post radiosurgery-completed on 02/11/2023 Durvalumab 1500 mg IV monthly-s/p cycle #3-- start on 02/17/2023     Interim History:  Brandon Phillips is back for follow-up.  When we last saw him, he had to go to the hospital because of chest pain.  He was evaluated thoroughly by Cardiology.  He actually did have a cardiac cath.  Thankfully, he did not require any stents.  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  He now is on Imdur and amlodipine.  He has not had no further chest pain.  He has had no headaches.  He actually did have a CT angiogram of the chest on 10/10/2023.  This actually showed that there is further decrease in left lower lung mass measuring 1 x 1.5 cm.  Nothing was noted in the mediastinum.  There was decrease in the left hilar mass.  He does look better.  He is feeling good.  He has had no problems with nausea or vomiting.  There is been no left arm pain.  He has had no jaw pain.  There is been no issues with bowels or bladder.  There has been no leg swelling.  He has had no bleeding.  There has been no issues with cough or shortness of breath.  He continues on the Eliquis.  He has done well on the Eliquis.  Overall, I would have said that his performance status is probably ECOG 1.    Wt Readings from Last 3 Encounters:  10/31/23 178 lb (80.7 kg)  10/24/23 185 lb (83.9 kg)  10/16/23 176 lb 1.9 oz (79.9 kg)     Medications:  Current Outpatient Medications:    albuterol (PROVENTIL) (2.5 MG/3ML) 0.083% nebulizer  solution, Take 3 mLs (2.5 mg total) by nebulization every 6 (six) hours as needed for wheezing or shortness of breath., Disp: 75 mL, Rfl: 5   amLODipine (NORVASC) 5 MG tablet, Take 1 tablet (5 mg total) by mouth daily., Disp: 90 tablet, Rfl: 3   apixaban (ELIQUIS) 2.5 MG TABS tablet, Take 1 tablet (2.5 mg total) by mouth 2 (two) times daily., Disp: 180 tablet, Rfl: 3   aspirin EC 81 MG tablet, Take 1 tablet (81 mg total) by mouth daily. Swallow whole., Disp: 90 tablet, Rfl: 3   atorvastatin (LIPITOR) 40 MG tablet, Take 1 tablet (40 mg total) by mouth daily., Disp: 90 tablet, Rfl: 3   Budeson-Glycopyrrol-Formoterol (BREZTRI AEROSPHERE) 160-9-4.8 MCG/ACT AERO, Inhale 2 puffs into the lungs in the morning and at bedtime., Disp: 1 each, Rfl: 11   COENZYME Q10 PO, Take 100 mg by mouth in the morning., Disp: , Rfl:    folic acid (FOLVITE) 1 MG tablet, TAKE 1 TABLET BY MOUTH DAILY. START 7 DAYS BEFORE PEMETREXED CHEMOTHERAPY, CONTINUE UNTIL 21 DAYS AFTER PEMETREXED COMPLETED, Disp: 30 tablet, Rfl: 8   gabapentin (NEURONTIN) 300 MG capsule, Take 1 capsule (300 mg total) by mouth 3 (three) times daily., Disp: 90 capsule, Rfl: 3   GUAIFENESIN 1200 PO, Take 1,200 mg by mouth in the morning and at bedtime., Disp: ,  Rfl:    isosorbide mononitrate (IMDUR) 30 MG 24 hr tablet, Take 1 tablet (30 mg total) by mouth daily., Disp: 90 tablet, Rfl: 3   Multiple Vitamin (MULTIVITAMIN) capsule, Take 1 capsule by mouth at bedtime., Disp: , Rfl:    albuterol (VENTOLIN HFA) 108 (90 Base) MCG/ACT inhaler, Inhale 2 puffs into the lungs every 6 (six) hours as needed for wheezing. (Patient not taking: Reported on 10/31/2023), Disp: 2 each, Rfl: 11   lidocaine-prilocaine (EMLA) cream, Apply a dime size to port-a-cath 1-2 hours prior to access. Cover with saran wrap. (Patient not taking: Reported on 10/31/2023), Disp: 30 g, Rfl: 2   triamcinolone cream (KENALOG) 0.1 %, Apply 1 Application topically 2 (two) times daily. (Patient not  taking: Reported on 10/31/2023), Disp: 45 g, Rfl: 3  Allergies:  Allergies  Allergen Reactions   Imdur [Isosorbide Nitrate] Other (See Comments)    Headache    Past Medical History, Surgical history, Social history, and Family History were reviewed and updated.  Review of Systems: Review of Systems  Constitutional: Negative.   HENT:  Negative.    Eyes: Negative.   Respiratory:  Positive for cough.   Cardiovascular: Negative.   Gastrointestinal: Negative.   Endocrine: Negative.   Genitourinary: Negative.    Musculoskeletal: Negative.   Skin: Negative.   Neurological: Negative.   Hematological: Negative.   Psychiatric/Behavioral: Negative.      Physical Exam: His vital signs are temperature 97.9.  Pulse 71.  Blood pressure 105/81.  Weight is 178 pounds.    Wt Readings from Last 3 Encounters:  10/31/23 178 lb (80.7 kg)  10/24/23 185 lb (83.9 kg)  10/16/23 176 lb 1.9 oz (79.9 kg)    Physical Exam Vitals reviewed.  HENT:     Head: Normocephalic and atraumatic.  Eyes:     Pupils: Pupils are equal, round, and reactive to light.  Cardiovascular:     Rate and Rhythm: Normal rate and regular rhythm.     Heart sounds: Normal heart sounds.  Pulmonary:     Effort: Pulmonary effort is normal.     Breath sounds: Normal breath sounds.  Abdominal:     General: Bowel sounds are normal.     Palpations: Abdomen is soft.  Musculoskeletal:        General: No tenderness or deformity. Normal range of motion.     Cervical back: Normal range of motion.  Lymphadenopathy:     Cervical: No cervical adenopathy.  Skin:    General: Skin is warm and dry.     Findings: No erythema or rash.  Neurological:     Mental Status: He is alert and oriented to person, place, and time.  Psychiatric:        Behavior: Behavior normal.        Thought Content: Thought content normal.        Judgment: Judgment normal.    Lab Results  Component Value Date   WBC 6.4 10/31/2023   HGB 14.4 10/31/2023    HCT 42.1 10/31/2023   MCV 88.3 10/31/2023   PLT 243 10/31/2023     Chemistry      Component Value Date/Time   NA 138 10/31/2023 0812   NA 137 10/16/2023 1206   K 4.0 10/31/2023 0812   CL 101 10/31/2023 0812   CO2 28 10/31/2023 0812   BUN 14 10/31/2023 0812   BUN 11 10/16/2023 1206   CREATININE 1.01 10/31/2023 0812   CREATININE 1.05 01/11/2022 0000  Component Value Date/Time   CALCIUM 8.9 10/31/2023 0812   ALKPHOS 47 10/31/2023 0812   AST 18 10/31/2023 0812   ALT 35 10/31/2023 0812   BILITOT 0.5 10/31/2023 1478       Impression and Plan: Brandon Phillips is a very nice 58 year old white male.  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  Again, he does not wish to have surgery.  He has seen Dr. Dorris Fetch of thoracic surgery.  Happy that everything is doing well with his heart.  I am happy that everything is doing well with his cancer.  The CT angiogram of the chest seems to show that the cancer is still responding.  I do think that a PET scan is going to be indicated.  I do think this is the best way for [place] to measure how he is responding.  Will see about getting a PET scan on him.  I will try to set one up for him in about 3 weeks.  I will go ahead and plan for follow-up in 4 weeks.  We are going to get her back on to the Durvalumab today.  [name], MD 4/4/20259:18 AM

## 2023-11-01 LAB — T4: T4, Total: 7 ug/dL (ref 4.5–12.0)

## 2023-11-17 ENCOUNTER — Encounter: Payer: Self-pay | Admitting: Internal Medicine

## 2023-11-17 ENCOUNTER — Ambulatory Visit: Payer: BC Managed Care – PPO | Admitting: Internal Medicine

## 2023-11-17 VITALS — BP 120/80 | HR 97 | Temp 97.9°F | Ht 65.0 in | Wt 188.8 lb

## 2023-11-17 DIAGNOSIS — J9611 Chronic respiratory failure with hypoxia: Secondary | ICD-10-CM | POA: Diagnosis not present

## 2023-11-17 DIAGNOSIS — Z87898 Personal history of other specified conditions: Secondary | ICD-10-CM | POA: Diagnosis not present

## 2023-11-17 DIAGNOSIS — R0683 Snoring: Secondary | ICD-10-CM

## 2023-11-17 NOTE — Progress Notes (Signed)
 @Patient  ID: Brandon Phillips., male    DOB: Aug 14, 1965, 58 y.o.   MRN: [id number]  No chief complaint on file.   Referring provider: Cherre Cornish, NP  HPI: 58 year old male, former heavy smoker followed for NSCLC of left lung and COPD. He is a patient of Dr. Zannie Hey and last seen in office 12/19/2022. Past medical history significant for HTN, allergic rhinitis.   TEST/EVENTS:  07/25/2022 LDCT chest: Atherosclerosis.  Spiculated solid central left lower lobe lung mass measuring 35 mm, new.  Solid peripheral right upper lobe pulmonary nodule measuring 7.9 mm. 08/14/2022 PET scan: Low-level hypermetabolism of the right upper lobe pulmonary nodule.  Hypermetabolism of the left lower lobe lung mass and ipsilateral hilar nodal metastasis. 08/26/2022 PFT: FVC 66, FEV1 42, ratio 48, TLC 122, DLCOcor 57.  No BD. 11/19/2022 CXR: stable left lower lobe mass. New right-sided chest wall port.  12/04/2022 CTA chest: small subsegmental RLL PE. LLL perihilar mass, 2.2x1.8 cm. No LAD. No acute consolidation.  01/01/2023 PET: LLL nodule with intense hypermetabolism despite decrease in size. Hypermetabolic left hilar node. Intense metabolic activity in right shoulder AC joint, no CT lesion evident and favors non-neoplastic activity. No distant metastatic disease. Post radiation change in marrow of lower thoracic spine.   10/29/2022: OV with Dr. Jenny Mohs.  Undergoing radiation for stage IIb NSCLC left lower lobe, neoadjuvant chemotherapy with subsequent plan for restaging.  Resection potentially offered though he is hopeful to avoid surgery.  Does feel like his dyspnea has been worse since last visit.  He has had cough with yellow sputum production over the last 6 days.  He also increased pain with swallowing since starting XRT.  Treated for AE COPD with Z-Pak and prednisone  taper.  Switched from Symbicort  to Breztri .  Advised to call if symptoms did not improve.  11/19/2022: OV with Cobb NP for acute visit with his  wife.  He was seen 4/2 and treated for a COPD with Z-Pak and prednisone  taper.  He tells me that when he was on the prednisone , he was feeling a little bit better.  His wife was not noticing him wheeze.  Since he has finished these medications, he feels like he is more short of breath, has more chest congestion and wheezing and is still producing yellow phlegm.  Cough tends to be worse at night and then in the morning when he first gets up.  He does have some nasal drainage.  Denies any fevers, night sweats, hemoptysis, lower extremity swelling, orthopnea.  No interim sick exposures.  Using Breztri  twice daily.  Does not have a neb machine at home. Eating and drinking well.    11/17/23 57 yoM being followed mostly by Lorenza Romans, NP for LLL for stage 2b NSCCALC  12/04/2022:  OV with Cobb NP for follow up. We treated him for unresolved acute bronchitis/AECOPD at his last visit with doxycycline  course and prednisone  taper. He had previously had good response to the steroids but flared back up once off. His CXR did not show an acute process. He was also recommended to start intranasal steroid for postnasal drainage and cough control measures.  Today, he tells me that he is some better but still having some chest congestion and cough. His cough is mostly dry but occasionally producing some white phlegm at times. Does have chest discomfort with the cough at times. His heart rate is up compared to his last visit. They haven't noticed much wheezing. Breathing feels better but still more  short winded than his baseline. He is having some increased voice hoarseness. Denies fevers, chills, hemoptysis, leg swelling, calf pain, palpitations. He is using Breztri . He's not sure if the Breztri  is making him cough more and wonders if he should go back to his Symbicort . Doesn't notice an increase cough right after he uses it. He is using flonase  but he thinks this makes him drain more. Still using his neb treatments twice a day,  which help some. Not using any cough suppressants as he was worried that he shouldn't be suppressing his cough.  D dimer positive - CTA positive for subsegmental PE. Admitted for treatment. No right heart strain  12/19/2022: OV with Dr. Jenny Mohs. Started on Eliquis  for subsegmental RLL PE. Doing ok now. Quite a bit of chest congestion. Using breztri  bid. Advised to use mucinex and flutter valve for chest congestion. Upcoming restaging PET/CT. F/u 8 weeks or sooner.  Patient presents today for intended follow up but he is also struggling with some acute symptoms. He's had increased shortness of breath over the past week or two. He gets winded just walking from the parking lot into the building. He is coughing some more. Producing some white phlegm. He does have some wheezing at times. His wife says that he had some low O2 levels over the weekend, down to 84% with exertion. He denies any fevers, chills, hemoptysis, leg swelling, calf pain, orthopnea, CP. He has not had any missed doses of Eliquis . No excessive bruising or bleeding. He is using his albuterol  twice a day and neb twice a week. Still on Breztri .  =============================================  11/17/23 57 yoM being followed mostly by Lorenza Romans, NP for LLL for stage 2b NSCCALC, Nows referred with concern of snoring and suspected OSA, Snoring.  Referred by Dr. Bertrum Brodie for snoring.  Patient had ONO on 2L oxygen  with normal sats in February 2025. Epworth score- 2 Body weight today-188 lbs No ENT surgery. No naps. O2 2L/ [employer]. No sleep meds. 1 cup morning coffee. Bedtime 7-8PM, short latency, then up for work 3:45 A?M.  Allergies  Allergen Reactions   Imdur  [Isosorbide  Nitrate] Other (See Comments)    Headache    Immunization History  Administered Date(s) Administered   PFIZER(Purple Top)SARS-COV-2 Vaccination 08/03/2020, 08/25/2020   PNEUMOCOCCAL CONJUGATE-20 09/24/2023   Pneumococcal Polysaccharide-23 01/15/2018   Tdap 01/15/2018    Zoster Recombinant(Shingrix) 07/30/2019, 11/27/2019    Past Medical History:  Diagnosis Date   Cancer (HCC)    lung cancer   CAP (community acquired pneumonia)    COPD (chronic obstructive pulmonary disease) (HCC)    Eczema    HLD (hyperlipidemia)    Primary cancer of left lower lobe of lung (HCC) 09/17/2022   Tobacco use disorder 01/24/2017    Tobacco History: Social History   Tobacco Use  Smoking Status Former   Current packs/day: 0.00   Average packs/day: 1.5 packs/day for 35.0 years (52.5 ttl pk-yrs)   Types: Cigarettes   Start date: 03/30/1982   Quit date: 03/30/2017   Years since quitting: 6.6  Smokeless Tobacco Never   Counseling given: Not Answered   Outpatient Medications Prior to Visit  Medication Sig Dispense Refill   albuterol  (PROVENTIL ) (2.5 MG/3ML) 0.083% nebulizer solution Take 3 mLs (2.5 mg total) by nebulization every 6 (six) hours as needed for wheezing or shortness of breath. 75 mL 5   albuterol  (VENTOLIN  HFA) 108 (90 Base) MCG/ACT inhaler Inhale 2 puffs into the lungs every 6 (six) hours as needed for wheezing. (Patient  not taking: Reported on 10/31/2023) 2 each 11   amLODipine  (NORVASC ) 5 MG tablet Take 1 tablet (5 mg total) by mouth daily. 90 tablet 3   apixaban  (ELIQUIS ) 2.5 MG TABS tablet Take 1 tablet (2.5 mg total) by mouth 2 (two) times daily. 180 tablet 3   aspirin  EC 81 MG tablet Take 1 tablet (81 mg total) by mouth daily. Swallow whole. 90 tablet 3   atorvastatin  (LIPITOR) 40 MG tablet Take 1 tablet (40 mg total) by mouth daily. 90 tablet 3   Budeson-Glycopyrrol-Formoterol  (BREZTRI  AEROSPHERE) 160-9-4.8 MCG/ACT AERO Inhale 2 puffs into the lungs in the morning and at bedtime. 1 each 11   COENZYME Q10 PO Take 100 mg by mouth in the morning.     folic acid  (FOLVITE ) 1 MG tablet TAKE 1 TABLET BY MOUTH DAILY. START 7 DAYS BEFORE PEMETREXED  CHEMOTHERAPY, CONTINUE UNTIL 21 DAYS AFTER PEMETREXED  COMPLETED 30 tablet 8   gabapentin  (NEURONTIN ) 300 MG capsule  Take 1 capsule (300 mg total) by mouth 3 (three) times daily. 90 capsule 3   GUAIFENESIN 1200 PO Take 1,200 mg by mouth in the morning and at bedtime.     isosorbide  mononitrate (IMDUR ) 30 MG 24 hr tablet Take 1 tablet (30 mg total) by mouth daily. 90 tablet 3   lidocaine -prilocaine  (EMLA ) cream Apply a dime size to port-a-cath 1-2 hours prior to access. Cover with saran wrap. (Patient not taking: Reported on 10/31/2023) 30 g 2   Multiple Vitamin (MULTIVITAMIN) capsule Take 1 capsule by mouth at bedtime.     triamcinolone  cream (KENALOG ) 0.1 % Apply 1 Application topically 2 (two) times daily. (Patient not taking: Reported on 10/31/2023) 45 g 3   No facility-administered medications prior to visit.     Review of Systems:   Constitutional: No weight loss or gain, night sweats, fevers, chills, or lassitude. +fatigue  HEENT: +headaches, difficulty swallowing, tooth/dental problems, or sore throat. No sneezing, itching, ear ache. +nasal congestion, post nasal drip, voice hoarseness  CV:  No chest pain, orthopnea, PND, swelling in lower extremities, anasarca, dizziness, +palpitations, syncope Resp: +shortness of breath with exertion; +cough; wheezing. No hemoptysis. No chest wall deformity GI:  No heartburn, indigestion, abdominal pain, nausea, vomiting, diarrhea, change in bowel habits, loss of appetite, bloody stools.  GU: No dysuria, change in color of urine, urgency or frequency.  No flank pain, no hematuria  Skin: No rash, lesions, ulcerations MSK:  No joint pain or swelling.   Neuro: No dizziness or lightheadedness.  Psych: No depression or anxiety. Mood stable.     Physical Exam:  GEN: Pleasant, interactive, well-appearing; in no acute distress.+ obesse HEENT:  Normocephalic and atraumatic. PERRLA. Sclera white. Nasal turbinates pink, moist and patent bilaterally. No rhinorrhea present. Oropharynx pink and moist, without exudate or edema. No lesions, ulcerations, or postnasal drip.  Mallampati +4, +teeth NECK:  Supple w/ fair ROM. No JVD present. Normal carotid impulses w/o bruits. Thyroid  symmetrical with no goiter or nodules palpated. No lymphadenopathy.   CV: RRR, no m/r/g, no peripheral edema. Pulses intact, +2 bilaterally. No cyanosis, pallor or clubbing. PULMONARY:  Unlabored, regular breathing at rest. Increased work of breathing with exertion. Expiratory wheeze L>R. No accessory muscle use.  GI: BS present and normoactive. Soft, non-tender to palpation. No organomegaly or masses detected.  MSK: No erythema, warmth or tenderness. Cap refil <2 sec all extrem. No deformities or joint swelling noted.  Neuro: A/Ox3. No focal deficits noted.   Skin: Warm, no lesions or rashe Psych:  Normal affect and behavior. Judgement and thought content appropriate.    Lab Results:  CBC    Component Value Date/Time   WBC 6.4 10/31/2023 0812   WBC 7.1 10/14/2023 0900   RBC 4.77 10/31/2023 0812   HGB 14.4 10/31/2023 0812   HGB 14.9 10/16/2023 1206   HCT 42.1 10/31/2023 0812   HCT 43.6 10/16/2023 1206   PLT 243 10/31/2023 0812   PLT 281 10/16/2023 1206   MCV 88.3 10/31/2023 0812   MCV 88 10/16/2023 1206   MCH 30.2 10/31/2023 0812   MCHC 34.2 10/31/2023 0812   RDW 13.9 10/31/2023 0812   RDW 13.9 10/16/2023 1206   LYMPHSABS 0.7 10/31/2023 0812   LYMPHSABS 0.7 04/16/2023 1109   MONOABS 0.6 10/31/2023 0812   EOSABS 0.1 10/31/2023 0812   EOSABS 0.2 04/16/2023 1109   BASOSABS 0.1 10/31/2023 0812   BASOSABS 0.1 04/16/2023 1109    BMET    Component Value Date/Time   NA 138 10/31/2023 0812   NA 137 10/16/2023 1206   K 4.0 10/31/2023 0812   CL 101 10/31/2023 0812   CO2 28 10/31/2023 0812   GLUCOSE 94 10/31/2023 0812   BUN 14 10/31/2023 0812   BUN 11 10/16/2023 1206   CREATININE 1.01 10/31/2023 0812   CREATININE 1.05 01/11/2022 0000   CALCIUM  8.9 10/31/2023 0812   GFRNONAA >60 10/31/2023 0812   GFRNONAA 88 04/06/2020 0708   GFRAA 102 04/06/2020 0708    BNP     Component Value Date/Time   BNP 87.1 02/17/2023 0930     Imaging:  No results found.  dexamethasone  (DECADRON ) injection 4 mg     Date Action Dose Route User   Discharged on 10/14/2023   Admitted on 10/10/2023   09/18/2023 1519 Given 4 mg Intra-articular Sikora, Rebecca, DPM      durvalumab  (IMFINZI ) 1,500 mg in sodium chloride  0.9 % 100 mL chemo infusion     Date Action Dose Route User   10/31/2023 1132 Rate/Dose Change (none) Intravenous Huntley Mai, RN   10/31/2023 1132 Rate/Dose Change (none) Intravenous Huntley Mai, RN   10/31/2023 1032 Rate/Dose Change (none) Intravenous Poindexter, Hannah E, RN   10/31/2023 1031 New Bag/Given 1,500 mg Intravenous Ryan Coyer, RN      heparin  lock flush 100 unit/mL     Date Action Dose Route User   10/31/2023 1141 Given 500 Units Intracatheter Poindexter, Aleatha Hunting, RN      0.9 %  sodium chloride  infusion     Date Action Dose Route User   10/31/2023 1143 Rate/Dose Change (none) Intravenous Huntley Mai, RN   10/31/2023 1137 Rate/Dose Change (none) Intravenous Huntley Mai, RN   10/31/2023 4259 New Bag/Given (none) Intravenous Huntley Mai, RN      sodium chloride  flush (NS) 0.9 % injection 10 mL     Date Action Dose Route User   10/31/2023 1141 Given 10 mL Intracatheter Poindexter, Aleatha Hunting, RN      triamcinolone  acetonide (KENALOG ) 10 MG/ML injection 2.5 mg     Date Action Dose Route User   Discharged on 10/14/2023   Admitted on 10/10/2023   09/18/2023 1519 Given 2.5 mg Intra-articular Sikora, Rebecca, DPM          Latest Ref Rng & Units 08/26/2022    3:25 PM  PFT Results  FVC-Pre L 2.75   FVC-Predicted Pre % 66   FVC-Post L 2.77   FVC-Predicted Post % 67   Pre FEV1/FVC % %  48   Post FEV1/FCV % % 48   FEV1-Pre L 1.33   FEV1-Predicted Pre % 42   FEV1-Post L 1.33   DLCO uncorrected ml/min/mmHg 15.07   DLCO UNC% % 61   DLCO corrected ml/min/mmHg 14.04   DLCO COR %Predicted % 57    DLVA Predicted % 60   TLC L 7.30   TLC % Predicted % 122   RV % Predicted % 235     No results found for: "NITRICOXIDE"      Assessment & Plan:         Rosa [employer], MD 11/17/2023  Pt aware and understands NP's role.

## 2023-11-17 NOTE — Patient Instructions (Signed)
 Order- schedule home sleep test      dx Snoring  Please  call us  bout 2 weeks after your sleep test for results and recommendations

## 2023-11-18 DIAGNOSIS — E785 Hyperlipidemia, unspecified: Secondary | ICD-10-CM | POA: Diagnosis not present

## 2023-11-18 DIAGNOSIS — J449 Chronic obstructive pulmonary disease, unspecified: Secondary | ICD-10-CM | POA: Diagnosis not present

## 2023-11-18 DIAGNOSIS — R490 Dysphonia: Secondary | ICD-10-CM | POA: Diagnosis not present

## 2023-11-24 ENCOUNTER — Encounter (HOSPITAL_COMMUNITY)
Admission: RE | Admit: 2023-11-24 | Discharge: 2023-11-24 | Disposition: A | Discharge status: Home / Self Care | Source: Ambulatory Visit | Attending: Hematology & Oncology | Admitting: Hematology & Oncology

## 2023-11-24 DIAGNOSIS — C3432 Malignant neoplasm of lower lobe, left bronchus or lung: Secondary | ICD-10-CM | POA: Insufficient documentation

## 2023-11-24 DIAGNOSIS — R918 Other nonspecific abnormal finding of lung field: Secondary | ICD-10-CM | POA: Diagnosis not present

## 2023-11-24 DIAGNOSIS — C3490 Malignant neoplasm of unspecified part of unspecified bronchus or lung: Secondary | ICD-10-CM | POA: Diagnosis not present

## 2023-11-24 LAB — GLUCOSE, CAPILLARY: Glucose-Capillary: 105 mg/dL — ABNORMAL HIGH (ref 70–99)

## 2023-11-24 MED ORDER — FLUDEOXYGLUCOSE F - 18 (FDG) INJECTION
9.4000 | Freq: Once | INTRAVENOUS | Status: AC
  Administered 2023-11-24: 9.07 via INTRAVENOUS

## 2023-11-25 DIAGNOSIS — E785 Hyperlipidemia, unspecified: Secondary | ICD-10-CM | POA: Diagnosis not present

## 2023-11-25 DIAGNOSIS — R490 Dysphonia: Secondary | ICD-10-CM | POA: Diagnosis not present

## 2023-11-26 ENCOUNTER — Telehealth: Payer: Self-pay

## 2023-11-26 NOTE — Telephone Encounter (Signed)
 Orthotics taken to [facility] office patients wife is aware

## 2023-11-28 ENCOUNTER — Encounter: Payer: Self-pay | Admitting: Hematology & Oncology

## 2023-11-28 ENCOUNTER — Inpatient Hospital Stay: Attending: Hematology & Oncology

## 2023-11-28 ENCOUNTER — Inpatient Hospital Stay

## 2023-11-28 ENCOUNTER — Inpatient Hospital Stay (HOSPITAL_BASED_OUTPATIENT_CLINIC_OR_DEPARTMENT_OTHER): Admitting: Hematology & Oncology

## 2023-11-28 ENCOUNTER — Other Ambulatory Visit: Payer: Self-pay

## 2023-11-28 VITALS — BP 118/82 | HR 67 | Resp 20

## 2023-11-28 VITALS — BP 116/77 | HR 96 | Temp 97.9°F | Resp 18 | Ht 65.0 in | Wt 179.0 lb

## 2023-11-28 DIAGNOSIS — Z87891 Personal history of nicotine dependence: Secondary | ICD-10-CM | POA: Insufficient documentation

## 2023-11-28 DIAGNOSIS — Z79620 Long term (current) use of immunosuppressive biologic: Secondary | ICD-10-CM | POA: Diagnosis not present

## 2023-11-28 DIAGNOSIS — C3432 Malignant neoplasm of lower lobe, left bronchus or lung: Secondary | ICD-10-CM

## 2023-11-28 DIAGNOSIS — Z5112 Encounter for antineoplastic immunotherapy: Secondary | ICD-10-CM | POA: Insufficient documentation

## 2023-11-28 DIAGNOSIS — Z79899 Other long term (current) drug therapy: Secondary | ICD-10-CM | POA: Insufficient documentation

## 2023-11-28 LAB — CBC WITH DIFFERENTIAL (CANCER CENTER ONLY)
Abs Immature Granulocytes: 0.03 10*3/uL (ref 0.00–0.07)
Basophils Absolute: 0.1 10*3/uL (ref 0.0–0.1)
Basophils Relative: 1 %
Eosinophils Absolute: 0.2 10*3/uL (ref 0.0–0.5)
Eosinophils Relative: 2 %
HCT: 43.2 % (ref 39.0–52.0)
Hemoglobin: 14.8 g/dL (ref 13.0–17.0)
Immature Granulocytes: 0 %
Lymphocytes Relative: 9 %
Lymphs Abs: 0.8 10*3/uL (ref 0.7–4.0)
MCH: 30.2 pg (ref 26.0–34.0)
MCHC: 34.3 g/dL (ref 30.0–36.0)
MCV: 88.2 fL (ref 80.0–100.0)
Monocytes Absolute: 0.6 10*3/uL (ref 0.1–1.0)
Monocytes Relative: 7 %
Neutro Abs: 6.8 10*3/uL (ref 1.7–7.7)
Neutrophils Relative %: 81 %
Platelet Count: 283 10*3/uL (ref 150–400)
RBC: 4.9 MIL/uL (ref 4.22–5.81)
RDW: 13.2 % (ref 11.5–15.5)
WBC Count: 8.5 10*3/uL (ref 4.0–10.5)
nRBC: 0 % (ref 0.0–0.2)

## 2023-11-28 LAB — CMP (CANCER CENTER ONLY)
ALT: 32 U/L (ref 0–44)
AST: 18 U/L (ref 15–41)
Albumin: 4.3 g/dL (ref 3.5–5.0)
Alkaline Phosphatase: 57 U/L (ref 38–126)
Anion gap: 8 (ref 5–15)
BUN: 14 mg/dL (ref 6–20)
CO2: 30 mmol/L (ref 22–32)
Calcium: 9.1 mg/dL (ref 8.9–10.3)
Chloride: 100 mmol/L (ref 98–111)
Creatinine: 0.93 mg/dL (ref 0.61–1.24)
GFR, Estimated: >60 mL/min (ref >60)
Glucose, Bld: 129 mg/dL — ABNORMAL HIGH (ref 70–99)
Potassium: 3.9 mmol/L (ref 3.5–5.1)
Sodium: 138 mmol/L (ref 135–145)
Total Bilirubin: 0.5 mg/dL (ref 0.0–1.2)
Total Protein: 7.1 g/dL (ref 6.5–8.1)

## 2023-11-28 LAB — TSH: TSH: 1.47 u[IU]/mL (ref 0.350–4.500)

## 2023-11-28 LAB — LACTATE DEHYDROGENASE: LDH: 130 U/L (ref 98–192)

## 2023-11-28 MED ORDER — HEPARIN SOD (PORK) LOCK FLUSH 100 UNIT/ML IV SOLN
500.0000 [IU] | Freq: Once | INTRAVENOUS | Status: AC | PRN
  Administered 2023-11-28: 500 [IU]

## 2023-11-28 MED ORDER — SODIUM CHLORIDE 0.9 % IV SOLN
1500.0000 mg | Freq: Once | INTRAVENOUS | Status: AC
  Administered 2023-11-28: 1500 mg via INTRAVENOUS
  Filled 2023-11-28: qty 30

## 2023-11-28 MED ORDER — SODIUM CHLORIDE 0.9 % IV SOLN: Freq: Once | INTRAVENOUS | Status: AC

## 2023-11-28 MED ORDER — SODIUM CHLORIDE 0.9% FLUSH
10.0000 mL | INTRAVENOUS | Status: DC | PRN
  Administered 2023-11-28: 10 mL

## 2023-11-28 NOTE — Patient Instructions (Signed)

## 2023-11-28 NOTE — Patient Instructions (Signed)
 [facility] - A DEPT OF [facility]  Discharge Instructions: Thank you for choosing [facility] to provide your oncology and hematology care.   If you have a lab appointment with the Cancer Center, please go directly to the Cancer Center and check in at the registration area.  Wear comfortable clothing and clothing appropriate for easy access to any Portacath or PICC line.   We strive to give you quality time with your provider. You may need to reschedule your appointment if you arrive late (15 or more minutes).  Arriving late affects you and other patients whose appointments are after yours.  Also, if you miss three or more appointments without notifying the office, you may be dismissed from the clinic at the provider's discretion.      For prescription refill requests, have your pharmacy contact our office and allow 72 hours for refills to be completed.    Today you received the following chemotherapy and/or immunotherapy agents:  Imfinzi      To help prevent nausea and vomiting after your treatment, we encourage you to take your nausea medication as directed.  BELOW ARE SYMPTOMS THAT SHOULD BE REPORTED IMMEDIATELY: *FEVER GREATER THAN 100.4 F (38 C) OR HIGHER *CHILLS OR SWEATING *NAUSEA AND VOMITING THAT IS NOT CONTROLLED WITH YOUR NAUSEA MEDICATION *UNUSUAL SHORTNESS OF BREATH *UNUSUAL BRUISING OR BLEEDING *URINARY PROBLEMS (pain or burning when urinating, or frequent urination) *BOWEL PROBLEMS (unusual diarrhea, constipation, pain near the anus) TENDERNESS IN MOUTH AND THROAT WITH OR WITHOUT PRESENCE OF ULCERS (sore throat, sores in mouth, or a toothache) UNUSUAL RASH, SWELLING OR PAIN  UNUSUAL VAGINAL DISCHARGE OR ITCHING   Items with * indicate a potential emergency and should be followed up as soon as possible or go to the Emergency Department if any problems should occur.  Please show the CHEMOTHERAPY ALERT CARD or IMMUNOTHERAPY  ALERT CARD at check-in to the Emergency Department and triage nurse. Should you have questions after your visit or need to cancel or reschedule your appointment, please contact [facility] - A DEPT OF [facility]  [phone] and follow the prompts.  Office hours are 8:00 a.m. to 4:30 p.m. Monday - Friday. Please note that voicemails left after 4:00 p.m. may not be returned until the following business day.  We are closed weekends and major holidays. You have access to a nurse at all times for urgent questions. Please call the main number to the clinic [phone] and follow the prompts.  For any non-urgent questions, you may also contact your provider using MyChart. We now offer e-Visits for anyone 36 and older to request care online for non-urgent symptoms. For details visit [email or web address].   Also download the MyChart app! Go to the app store, search "MyChart", open the app, select [place], and log in with your MyChart username and password.

## 2023-11-28 NOTE — Progress Notes (Signed)
 [facility] Hematology and Oncology Follow Up Visit  Brandon Phillips [id number] [birth year] 58 y.o. 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 twice daily-start on 12/06/2022 -changed to 2.5 mg p.o. twice daily on 05/12/2023 Status post radiosurgery-completed on 02/11/2023 Durvalumab  1500 mg IV monthly-s/p cycle #3-- start on 02/17/2023     Interim History:  [name] is back for follow-up.  Overall, he is doing quite nicely.  He has gone through the cardiac issue that he had.  He is on aspirin .  He is also on Eliquis .  This is low-dose Eliquis .  He is on cholesterol medicine.  Hopefully this will help.    He had a PET scan that was done.  This was done on 11/24/2023.  PET scan to me, showed that he was still responding.  The area in the left hilum looked less active.  However, the radiologist pointed out a couple other potential abnormalities that were going to have to follow.  He has had no exacerbations of the COPD.  He is not smoking.Brandon Phillips  He has had no problems with nausea or vomiting.  Has had no problems with fever.  There has been no bleeding.  He has had no change in bowel or bladder habits.  He has had no leg swelling.  Overall, I would say that his performance status for now is probably ECOG 1.   Wt Readings from Last 3 Encounters:  11/28/23 179 lb (81.2 kg)  11/24/23 180 lb (81.6 kg)  11/17/23 188 lb 12.8 oz (85.6 kg)     Medications:  Current Outpatient Medications:    albuterol  (PROVENTIL ) (2.5 MG/3ML) 0.083% nebulizer solution, Take 3 mLs (2.5 mg total) by nebulization every 6 (six) hours as needed for wheezing or shortness of breath., Disp: 75 mL, Rfl: 5   albuterol  (VENTOLIN  HFA) 108 (90 Base) MCG/ACT inhaler, Inhale 2 puffs into the lungs  every 6 (six) hours as needed for wheezing., Disp: 2 each, Rfl: 11   amLODipine  (NORVASC ) 5 MG tablet, Take 1 tablet (5 mg total) by mouth daily., Disp: 90 tablet, Rfl: 3   apixaban  (ELIQUIS ) 2.5 MG TABS tablet, Take 1 tablet (2.5 mg total) by mouth 2 (two) times daily., Disp: 180 tablet, Rfl: 3   aspirin  EC 81 MG tablet, Take 1 tablet (81 mg total) by mouth daily. Swallow whole., Disp: 90 tablet, Rfl: 3   atorvastatin  (LIPITOR) 40 MG tablet, Take 1 tablet (40 mg total) by mouth daily., Disp: 90 tablet, Rfl: 3   Budeson-Glycopyrrol-Formoterol  (BREZTRI  AEROSPHERE) 160-9-4.8 MCG/ACT AERO, Inhale 2 puffs into the lungs in the morning and at bedtime., Disp: 1 each, Rfl: 11   COENZYME Q10 PO, Take 100 mg by mouth in the morning., Disp: , Rfl:    folic acid  (FOLVITE ) 1 MG tablet, TAKE 1 TABLET BY MOUTH DAILY. START 7 DAYS BEFORE PEMETREXED  CHEMOTHERAPY, CONTINUE UNTIL 21 DAYS AFTER PEMETREXED  COMPLETED, Disp: 30 tablet, Rfl: 8   gabapentin  (NEURONTIN ) 300 MG capsule, Take 1 capsule (300 mg total) by mouth 3 (three) times daily., Disp: 90 capsule, Rfl: 3   GUAIFENESIN 1200 PO, Take 1,200 mg by mouth in the morning and at bedtime., Disp: , Rfl:    isosorbide  mononitrate (IMDUR ) 30 MG 24 hr tablet, Take 1 tablet (30 mg total)  by mouth daily., Disp: 90 tablet, Rfl: 3   lidocaine -prilocaine  (EMLA ) cream, Apply a dime size to port-a-cath 1-2 hours prior to access. Cover with saran wrap., Disp: 30 g, Rfl: 2   Multiple Vitamin (MULTIVITAMIN) capsule, Take 1 capsule by mouth at bedtime., Disp: , Rfl:    triamcinolone  cream (KENALOG ) 0.1 %, Apply 1 Application topically 2 (two) times daily., Disp: 45 g, Rfl: 3 No current facility-administered medications for this visit.  Facility-Administered Medications Ordered in Other Visits:    0.9 %  sodium chloride  infusion, , Intravenous, Once, Elihu Milstein, Sherryll Donald, MD   durvalumab  (IMFINZI ) 1,500 mg in sodium chloride  0.9 % 100 mL chemo infusion, 1,500 mg, Intravenous, Once,  Mylan Schwarz, Sherryll Donald, MD   heparin  lock flush 100 unit/mL, 500 Units, Intracatheter, Once PRN, Celise Bazar R, MD   sodium chloride  flush (NS) 0.9 % injection 10 mL, 10 mL, Intracatheter, PRN, Ladaja Yusupov R, MD  Allergies:  Allergies  Allergen Reactions   Imdur  [Isosorbide  Nitrate] Other (See Comments)    Headache    Past Medical History, Surgical history, Social history, and Family History were reviewed and updated.  Review of Systems: Review of Systems  Constitutional: Negative.   HENT:  Negative.    Eyes: Negative.   Respiratory:  Positive for cough.   Cardiovascular: Negative.   Gastrointestinal: Negative.   Endocrine: Negative.   Genitourinary: Negative.    Musculoskeletal: Negative.   Skin: Negative.   Neurological: Negative.   Hematological: Negative.   Psychiatric/Behavioral: Negative.      Physical Exam: His vital signs are temperature 97.9.  Pulse 96.  Blood pressure 116/77.  Weight is 179 pounds.      Wt Readings from Last 3 Encounters:  11/28/23 179 lb (81.2 kg)  11/24/23 180 lb (81.6 kg)  11/17/23 188 lb 12.8 oz (85.6 kg)    Physical Exam Vitals reviewed.  HENT:     Head: Normocephalic and atraumatic.  Eyes:     Pupils: Pupils are equal, round, and reactive to light.  Cardiovascular:     Rate and Rhythm: Normal rate and regular rhythm.     Heart sounds: Normal heart sounds.  Pulmonary:     Effort: Pulmonary effort is normal.     Breath sounds: Normal breath sounds.  Abdominal:     General: Bowel sounds are normal.     Palpations: Abdomen is soft.  Musculoskeletal:        General: No tenderness or deformity. Normal range of motion.     Cervical back: Normal range of motion.  Lymphadenopathy:     Cervical: No cervical adenopathy.  Skin:    General: Skin is warm and dry.     Findings: No erythema or rash.  Neurological:     Mental Status: He is alert and oriented to person, place, and time.  Psychiatric:        Behavior: Behavior normal.         Thought Content: Thought content normal.        Judgment: Judgment normal.     Lab Results  Component Value Date   WBC 8.5 11/28/2023   HGB 14.8 11/28/2023   HCT 43.2 11/28/2023   MCV 88.2 11/28/2023   PLT 283 11/28/2023     Chemistry      Component Value Date/Time   NA 138 11/28/2023 0935   NA 137 10/16/2023 1206   K 3.9 11/28/2023 0935   CL 100 11/28/2023 0935   CO2 30 11/28/2023 0935  BUN 14 11/28/2023 0935   BUN 11 10/16/2023 1206   CREATININE 0.93 11/28/2023 0935   CREATININE 1.05 01/11/2022 0000      Component Value Date/Time   CALCIUM  9.1 11/28/2023 0935   ALKPHOS 57 11/28/2023 0935   AST 18 11/28/2023 0935   ALT 32 11/28/2023 0935   BILITOT 0.5 11/28/2023 0935       Impression and Plan: Mr. Goldwire is a very nice 58 year old white male.  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  Again, he does not wish to have surgery.  He has seen Dr. Luna Salinas of thoracic surgery.  I do think that the PET scan does look good.  As such, we will continue him on immunotherapy..  I do not think we need another PET scan probably for another 2-3 months.  Hopefully, the heart will do okay.  I am sure that he will see cardiology soon.  Will plan to get him back to see us  in another 4 weeks.    Ivor Mars, MD 5/2/202510:49 AM

## 2023-11-30 ENCOUNTER — Encounter: Payer: Self-pay | Admitting: Internal Medicine

## 2023-11-30 DIAGNOSIS — R0683 Snoring: Secondary | ICD-10-CM | POA: Insufficient documentation

## 2023-11-30 NOTE — Assessment & Plan Note (Signed)
He continues to benefit from O2 during sleep

## 2023-11-30 NOTE — Assessment & Plan Note (Signed)
 Probable OSA. Discussion done. Plan- schedule home sleep test. He will call for report.

## 2023-12-02 DIAGNOSIS — Z1211 Encounter for screening for malignant neoplasm of colon: Secondary | ICD-10-CM | POA: Diagnosis not present

## 2023-12-03 ENCOUNTER — Encounter

## 2023-12-03 DIAGNOSIS — Z87898 Personal history of other specified conditions: Secondary | ICD-10-CM

## 2023-12-03 DIAGNOSIS — G4730 Sleep apnea, unspecified: Secondary | ICD-10-CM | POA: Diagnosis not present

## 2023-12-04 DIAGNOSIS — G4730 Sleep apnea, unspecified: Secondary | ICD-10-CM | POA: Diagnosis not present

## 2023-12-09 DIAGNOSIS — I201 Angina pectoris with documented spasm: Secondary | ICD-10-CM | POA: Diagnosis not present

## 2023-12-09 DIAGNOSIS — Z86711 Personal history of pulmonary embolism: Secondary | ICD-10-CM | POA: Diagnosis not present

## 2023-12-09 DIAGNOSIS — I4729 Other ventricular tachycardia: Secondary | ICD-10-CM | POA: Diagnosis not present

## 2023-12-09 DIAGNOSIS — I2510 Atherosclerotic heart disease of native coronary artery without angina pectoris: Secondary | ICD-10-CM | POA: Diagnosis not present

## 2023-12-09 LAB — COLOGUARD: COLOGUARD: NEGATIVE

## 2023-12-10 ENCOUNTER — Encounter: Payer: Self-pay | Admitting: Hematology & Oncology

## 2023-12-10 ENCOUNTER — Ambulatory Visit: Payer: Self-pay | Admitting: Nurse Practitioner

## 2023-12-10 ENCOUNTER — Ambulatory Visit: Payer: Self-pay | Admitting: Medical-Surgical

## 2023-12-10 DIAGNOSIS — E785 Hyperlipidemia, unspecified: Secondary | ICD-10-CM

## 2023-12-10 DIAGNOSIS — Z79899 Other long term (current) drug therapy: Secondary | ICD-10-CM

## 2023-12-10 LAB — COMPREHENSIVE METABOLIC PANEL WITH GFR
ALT: 40 IU/L (ref 0–44)
AST: 23 IU/L (ref 0–40)
Albumin: 4.3 g/dL (ref 3.8–4.9)
Alkaline Phosphatase: 71 IU/L (ref 44–121)
BUN/Creatinine Ratio: 12 (ref 9–20)
BUN: 12 mg/dL (ref 6–24)
Bilirubin Total: 0.5 mg/dL (ref 0.0–1.2)
CO2: 24 mmol/L (ref 20–29)
Calcium: 9.7 mg/dL (ref 8.7–10.2)
Chloride: 99 mmol/L (ref 96–106)
Creatinine, Ser: 0.99 mg/dL (ref 0.76–1.27)
Globulin, Total: 2.7 g/dL (ref 1.5–4.5)
Glucose: 96 mg/dL (ref 70–99)
Potassium: 4.6 mmol/L (ref 3.5–5.2)
Sodium: 139 mmol/L (ref 134–144)
Total Protein: 7 g/dL (ref 6.0–8.5)
eGFR: 89 mL/min/{1.73_m2} (ref >59)

## 2023-12-10 LAB — LIPID PANEL
Chol/HDL Ratio: 5 ratio (ref 0.0–5.0)
Cholesterol, Total: 190 mg/dL (ref 100–199)
HDL: 38 mg/dL — ABNORMAL LOW (ref >39)
LDL Chol Calc (NIH): 113 mg/dL — ABNORMAL HIGH (ref 0–99)
Triglycerides: 223 mg/dL — ABNORMAL HIGH (ref 0–149)
VLDL Cholesterol Cal: 39 mg/dL (ref 5–40)

## 2023-12-11 MED ORDER — ATORVASTATIN CALCIUM 80 MG PO TABS: 80.0000 mg | ORAL_TABLET | Freq: Every day | ORAL | 3 refills | Status: DC

## 2023-12-11 NOTE — Telephone Encounter (Signed)
 Spoke with pts spouse and she was notified of lab results. Pt was fasting for at least 8 hours prior to lab work being drawn and pt has been taking Lipitor 40 mg daily. Per pts spouse, pt is ok with increasing Lipitor to 80 mg daily and will repeat fasting lipids, LFTs in 2 to 3 months.  Lab orders mailed to pt and medication sent to pts pharmacy.

## 2023-12-17 DIAGNOSIS — G4733 Obstructive sleep apnea (adult) (pediatric): Secondary | ICD-10-CM | POA: Diagnosis not present

## 2023-12-18 DIAGNOSIS — R490 Dysphonia: Secondary | ICD-10-CM | POA: Diagnosis not present

## 2023-12-18 DIAGNOSIS — E785 Hyperlipidemia, unspecified: Secondary | ICD-10-CM | POA: Diagnosis not present

## 2023-12-18 DIAGNOSIS — J449 Chronic obstructive pulmonary disease, unspecified: Secondary | ICD-10-CM | POA: Diagnosis not present

## 2023-12-19 ENCOUNTER — Telehealth: Payer: Self-pay | Admitting: Internal Medicine

## 2023-12-19 MED ORDER — BREZTRI AEROSPHERE 160-9-4.8 MCG/ACT IN AERO: 2.0000 | INHALATION_SPRAY | Freq: Two times a day (BID) | RESPIRATORY_TRACT | 11 refills | Status: DC

## 2023-12-19 NOTE — Telephone Encounter (Signed)
 Pt is est with Ramaswamy as of 09/24/23 and recall in place for upcoming appt

## 2023-12-19 NOTE — Telephone Encounter (Signed)
 Drug store requested Breztri  refill- pt of Dr Jenny Mohs- done  Needs office f/u to maintain- any provider COPD

## 2023-12-25 ENCOUNTER — Encounter: Payer: Self-pay | Admitting: Hematology & Oncology

## 2023-12-25 DIAGNOSIS — R490 Dysphonia: Secondary | ICD-10-CM | POA: Diagnosis not present

## 2023-12-25 DIAGNOSIS — E785 Hyperlipidemia, unspecified: Secondary | ICD-10-CM | POA: Diagnosis not present

## 2023-12-25 DIAGNOSIS — J449 Chronic obstructive pulmonary disease, unspecified: Secondary | ICD-10-CM | POA: Diagnosis not present

## 2023-12-26 ENCOUNTER — Encounter: Payer: Self-pay | Admitting: Hematology & Oncology

## 2023-12-26 ENCOUNTER — Inpatient Hospital Stay

## 2023-12-26 ENCOUNTER — Inpatient Hospital Stay: Admitting: Hematology & Oncology

## 2023-12-26 VITALS — BP 112/82 | HR 82 | Temp 98.3°F | Resp 16 | Ht 65.0 in | Wt 177.0 lb

## 2023-12-26 VITALS — BP 116/81 | HR 70 | Resp 17

## 2023-12-26 DIAGNOSIS — Z79620 Long term (current) use of immunosuppressive biologic: Secondary | ICD-10-CM | POA: Diagnosis not present

## 2023-12-26 DIAGNOSIS — I200 Unstable angina: Secondary | ICD-10-CM

## 2023-12-26 DIAGNOSIS — C3432 Malignant neoplasm of lower lobe, left bronchus or lung: Secondary | ICD-10-CM | POA: Diagnosis not present

## 2023-12-26 DIAGNOSIS — Z5112 Encounter for antineoplastic immunotherapy: Secondary | ICD-10-CM | POA: Diagnosis not present

## 2023-12-26 DIAGNOSIS — I10 Essential (primary) hypertension: Secondary | ICD-10-CM | POA: Diagnosis not present

## 2023-12-26 DIAGNOSIS — Z79899 Other long term (current) drug therapy: Secondary | ICD-10-CM | POA: Diagnosis not present

## 2023-12-26 DIAGNOSIS — Z87891 Personal history of nicotine dependence: Secondary | ICD-10-CM | POA: Diagnosis not present

## 2023-12-26 LAB — CMP (CANCER CENTER ONLY)
ALT: 40 U/L (ref 0–44)
AST: 21 U/L (ref 15–41)
Albumin: 4.5 g/dL (ref 3.5–5.0)
Alkaline Phosphatase: 58 U/L (ref 38–126)
Anion gap: 7 (ref 5–15)
BUN: 14 mg/dL (ref 6–20)
CO2: 31 mmol/L (ref 22–32)
Calcium: 9.2 mg/dL (ref 8.9–10.3)
Chloride: 101 mmol/L (ref 98–111)
Creatinine: 0.93 mg/dL (ref 0.61–1.24)
GFR, Estimated: >60 mL/min (ref >60)
Glucose, Bld: 92 mg/dL (ref 70–99)
Potassium: 4 mmol/L (ref 3.5–5.1)
Sodium: 139 mmol/L (ref 135–145)
Total Bilirubin: 0.7 mg/dL (ref 0.0–1.2)
Total Protein: 7.3 g/dL (ref 6.5–8.1)

## 2023-12-26 LAB — CBC WITH DIFFERENTIAL (CANCER CENTER ONLY)
Abs Immature Granulocytes: 0.02 10*3/uL (ref 0.00–0.07)
Basophils Absolute: 0.1 10*3/uL (ref 0.0–0.1)
Basophils Relative: 1 %
Eosinophils Absolute: 0.2 10*3/uL (ref 0.0–0.5)
Eosinophils Relative: 3 %
HCT: 44.8 % (ref 39.0–52.0)
Hemoglobin: 15.1 g/dL (ref 13.0–17.0)
Immature Granulocytes: 0 %
Lymphocytes Relative: 11 %
Lymphs Abs: 0.9 10*3/uL (ref 0.7–4.0)
MCH: 29.4 pg (ref 26.0–34.0)
MCHC: 33.7 g/dL (ref 30.0–36.0)
MCV: 87.2 fL (ref 80.0–100.0)
Monocytes Absolute: 0.7 10*3/uL (ref 0.1–1.0)
Monocytes Relative: 9 %
Neutro Abs: 6.1 10*3/uL (ref 1.7–7.7)
Neutrophils Relative %: 76 %
Platelet Count: 228 10*3/uL (ref 150–400)
RBC: 5.14 MIL/uL (ref 4.22–5.81)
RDW: 13.1 % (ref 11.5–15.5)
WBC Count: 7.9 10*3/uL (ref 4.0–10.5)
nRBC: 0 % (ref 0.0–0.2)

## 2023-12-26 LAB — LACTATE DEHYDROGENASE: LDH: 147 U/L (ref 98–192)

## 2023-12-26 MED ORDER — SODIUM CHLORIDE 0.9 % IV SOLN: Freq: Once | INTRAVENOUS | Status: AC

## 2023-12-26 MED ORDER — HEPARIN SOD (PORK) LOCK FLUSH 100 UNIT/ML IV SOLN
500.0000 [IU] | Freq: Once | INTRAVENOUS | Status: AC | PRN
  Administered 2023-12-26: 500 [IU]

## 2023-12-26 MED ORDER — SODIUM CHLORIDE 0.9 % IV SOLN
1500.0000 mg | Freq: Once | INTRAVENOUS | Status: AC
  Administered 2023-12-26: 1500 mg via INTRAVENOUS
  Filled 2023-12-26: qty 30

## 2023-12-26 MED ORDER — SODIUM CHLORIDE 0.9% FLUSH
10.0000 mL | INTRAVENOUS | Status: DC | PRN
  Administered 2023-12-26: 10 mL

## 2023-12-26 NOTE — Progress Notes (Signed)
 [facility] Hematology and Oncology Follow Up Visit  Brandon Phillips [id number] 1966-05-19 58 y.o. 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 twice daily-start on 12/06/2022 -changed to 2.5 mg p.o. twice daily on 05/12/2023 Status post radiosurgery-completed on 02/11/2023 Durvalumab  1500 mg IV monthly-s/p cycle #10-- start on 02/17/2023     Interim History:  Brandon Phillips is back for follow-up.  Overall, he is doing quite nicely.  Overall, he seems to be managing pretty well.  He did have a sleep study which did show some moderate sleep apnea.  He does wear some oxygen  at nighttime.  I am not sure if he is going to use CPAP or not.  He continues on Eliquis .  He is on low-dose Eliquis .  Never needed to have his Lipitor increased to 80 mg a day.  His cholesterol still has been on the high side.  It is possible that he may need to be put on one of the new injectable anticholesterol medications.  He has had no problems with chest pain.  He has had no cough.  Is had no nausea or vomiting.  Has had no change in bowel or bladder habits.  He has had no rashes.  Has had no leg swelling.  Overall, I would have said that his performance status is probably ECOG 1.   Wt Readings from Last 3 Encounters:  12/26/23 177 lb (80.3 kg)  11/28/23 179 lb (81.2 kg)  11/24/23 180 lb (81.6 kg)     Medications:  Current Outpatient Medications:    telmisartan  (MICARDIS ) 20 MG tablet, Take 20 mg by mouth daily., Disp: , Rfl:    albuterol  (PROVENTIL ) (2.5 MG/3ML) 0.083% nebulizer solution, Take 3 mLs (2.5 mg total) by nebulization every 6 (six) hours as needed for wheezing or shortness of breath., Disp: 75 mL, Rfl: 5   albuterol  (VENTOLIN  HFA) 108 (90 Base) MCG/ACT inhaler, Inhale 2  puffs into the lungs every 6 (six) hours as needed for wheezing., Disp: 2 each, Rfl: 11   amLODipine  (NORVASC ) 5 MG tablet, Take 1 tablet (5 mg total) by mouth daily., Disp: 90 tablet, Rfl: 3   apixaban  (ELIQUIS ) 2.5 MG TABS tablet, Take 1 tablet (2.5 mg total) by mouth 2 (two) times daily., Disp: 180 tablet, Rfl: 3   aspirin  EC 81 MG tablet, Take 1 tablet (81 mg total) by mouth daily. Swallow whole., Disp: 90 tablet, Rfl: 3   atorvastatin  (LIPITOR) 80 MG tablet, Take 1 tablet (80 mg total) by mouth daily., Disp: 90 tablet, Rfl: 3   budesonide -glycopyrrolate-formoterol  (BREZTRI  AEROSPHERE) 160-9-4.8 MCG/ACT AERO inhaler, Inhale 2 puffs into the lungs in the morning and at bedtime., Disp: 1 each, Rfl: 11   COENZYME Q10 PO, Take 100 mg by mouth in the morning., Disp: , Rfl:    folic acid  (FOLVITE ) 1 MG tablet, TAKE 1 TABLET BY MOUTH DAILY. START 7 DAYS BEFORE PEMETREXED  CHEMOTHERAPY, CONTINUE UNTIL 21 DAYS AFTER PEMETREXED  COMPLETED, Disp: 30 tablet, Rfl: 8   gabapentin  (NEURONTIN ) 300 MG capsule, Take 1 capsule (300 mg total) by mouth 3 (three) times daily., Disp: 90 capsule, Rfl: 3   GUAIFENESIN 1200 PO, Take 1,200 mg by mouth in the morning and at bedtime., Disp: , Rfl:    isosorbide  mononitrate (IMDUR ) 30 MG 24  hr tablet, Take 1 tablet (30 mg total) by mouth daily., Disp: 90 tablet, Rfl: 3   lidocaine -prilocaine  (EMLA ) cream, Apply a dime size to port-a-cath 1-2 hours prior to access. Cover with saran wrap., Disp: 30 g, Rfl: 2   Multiple Vitamin (MULTIVITAMIN) capsule, Take 1 capsule by mouth at bedtime., Disp: , Rfl:    triamcinolone  cream (KENALOG ) 0.1 %, Apply 1 Application topically 2 (two) times daily., Disp: 45 g, Rfl: 3  Allergies:  Allergies  Allergen Reactions   Imdur  [Isosorbide  Nitrate] Other (See Comments)    Headache    Past Medical History, Surgical history, Social history, and Family History were reviewed and updated.  Review of Systems: Review of Systems  Constitutional:  Negative.   HENT:  Negative.    Eyes: Negative.   Respiratory:  Positive for cough.   Cardiovascular: Negative.   Gastrointestinal: Negative.   Endocrine: Negative.   Genitourinary: Negative.    Musculoskeletal: Negative.   Skin: Negative.   Neurological: Negative.   Hematological: Negative.   Psychiatric/Behavioral: Negative.      Physical Exam: His vital signs are temperature 98.3.  Pulse 82.  Blood pressure 112/82.  Weight is 177 pounds.     Wt Readings from Last 3 Encounters:  12/26/23 177 lb (80.3 kg)  11/28/23 179 lb (81.2 kg)  11/24/23 180 lb (81.6 kg)    Physical Exam Vitals reviewed.  HENT:     Head: Normocephalic and atraumatic.  Eyes:     Pupils: Pupils are equal, round, and reactive to light.  Cardiovascular:     Rate and Rhythm: Normal rate and regular rhythm.     Heart sounds: Normal heart sounds.  Pulmonary:     Effort: Pulmonary effort is normal.     Breath sounds: Normal breath sounds.  Abdominal:     General: Bowel sounds are normal.     Palpations: Abdomen is soft.  Musculoskeletal:        General: No tenderness or deformity. Normal range of motion.     Cervical back: Normal range of motion.  Lymphadenopathy:     Cervical: No cervical adenopathy.  Skin:    General: Skin is warm and dry.     Findings: No erythema or rash.  Neurological:     Mental Status: He is alert and oriented to person, place, and time.  Psychiatric:        Behavior: Behavior normal.        Thought Content: Thought content normal.        Judgment: Judgment normal.     Lab Results  Component Value Date   WBC 7.9 12/26/2023   HGB 15.1 12/26/2023   HCT 44.8 12/26/2023   MCV 87.2 12/26/2023   PLT 228 12/26/2023     Chemistry      Component Value Date/Time   NA 139 12/26/2023 0927   NA 139 12/09/2023 0830   K 4.0 12/26/2023 0927   CL 101 12/26/2023 0927   CO2 31 12/26/2023 0927   BUN 14 12/26/2023 0927   BUN 12 12/09/2023 0830   CREATININE 0.93 12/26/2023  0927   CREATININE 1.05 01/11/2022 0000      Component Value Date/Time   CALCIUM  9.2 12/26/2023 0927   ALKPHOS 58 12/26/2023 0927   AST 21 12/26/2023 0927   ALT 40 12/26/2023 0927   BILITOT 0.7 12/26/2023 0927       Impression and Plan: Brandon Phillips is a very nice 58 year old white male.  He has a locally  advanced adenocarcinoma of the left lung.  Again, he does not wish to have surgery.  He has seen Dr. Luna Salinas of thoracic surgery.  We will continue the Durvalumab .  He will have a 1 year of the Durvalumab  in July.  His last PET scan was done in April.  We probably do not need another 1 until July.  Hopefully, the whole sleep apnea will not be a problem for him.  I will plan to see him back in another month.   Ivor Mars, MD 5/30/202510:28 AM

## 2023-12-26 NOTE — Patient Instructions (Signed)
 [facility] - A DEPT OF [facility]  Discharge Instructions: Thank you for choosing [facility] to provide your oncology and hematology care.   If you have a lab appointment with the Cancer Center, please go directly to the Cancer Center and check in at the registration area.  Wear comfortable clothing and clothing appropriate for easy access to any Portacath or PICC line.   We strive to give you quality time with your provider. You may need to reschedule your appointment if you arrive late (15 or more minutes).  Arriving late affects you and other patients whose appointments are after yours.  Also, if you miss three or more appointments without notifying the office, you may be dismissed from the clinic at the provider's discretion.      For prescription refill requests, have your pharmacy contact our office and allow 72 hours for refills to be completed.    Today you received the following chemotherapy and/or immunotherapy agents:  Imfinzi      To help prevent nausea and vomiting after your treatment, we encourage you to take your nausea medication as directed.  BELOW ARE SYMPTOMS THAT SHOULD BE REPORTED IMMEDIATELY: *FEVER GREATER THAN 100.4 F (38 C) OR HIGHER *CHILLS OR SWEATING *NAUSEA AND VOMITING THAT IS NOT CONTROLLED WITH YOUR NAUSEA MEDICATION *UNUSUAL SHORTNESS OF BREATH *UNUSUAL BRUISING OR BLEEDING *URINARY PROBLEMS (pain or burning when urinating, or frequent urination) *BOWEL PROBLEMS (unusual diarrhea, constipation, pain near the anus) TENDERNESS IN MOUTH AND THROAT WITH OR WITHOUT PRESENCE OF ULCERS (sore throat, sores in mouth, or a toothache) UNUSUAL RASH, SWELLING OR PAIN  UNUSUAL VAGINAL DISCHARGE OR ITCHING   Items with * indicate a potential emergency and should be followed up as soon as possible or go to the Emergency Department if any problems should occur.  Please show the CHEMOTHERAPY ALERT CARD or IMMUNOTHERAPY  ALERT CARD at check-in to the Emergency Department and triage nurse. Should you have questions after your visit or need to cancel or reschedule your appointment, please contact [facility] - A DEPT OF [facility]  [phone] and follow the prompts.  Office hours are 8:00 a.m. to 4:30 p.m. Monday - Friday. Please note that voicemails left after 4:00 p.m. may not be returned until the following business day.  We are closed weekends and major holidays. You have access to a nurse at all times for urgent questions. Please call the main number to the clinic [phone] and follow the prompts.  For any non-urgent questions, you may also contact your provider using MyChart. We now offer e-Visits for anyone 36 and older to request care online for non-urgent symptoms. For details visit [email or web address].   Also download the MyChart app! Go to the app store, search "MyChart", open the app, select [place], and log in with your MyChart username and password.

## 2023-12-26 NOTE — Patient Instructions (Signed)

## 2023-12-31 ENCOUNTER — Encounter: Payer: Self-pay | Admitting: *Deleted

## 2024-01-01 ENCOUNTER — Other Ambulatory Visit: Payer: Self-pay | Admitting: Medical-Surgical

## 2024-01-01 DIAGNOSIS — E782 Mixed hyperlipidemia: Secondary | ICD-10-CM

## 2024-01-08 ENCOUNTER — Encounter: Payer: BC Managed Care – PPO | Admitting: Medical-Surgical

## 2024-01-12 NOTE — Telephone Encounter (Signed)
 Please review and advise.

## 2024-01-14 NOTE — Telephone Encounter (Signed)
 Spoke with pt. Pt gets chest pain and jaw pain that last for 2 mins. It happens mostly after pts getting ready for work and has been up for 1 hour. The chest & jaw pain are mild per pt.  Pt hasn't had any symptoms in the last 2 days. Per pt, the symptoms are the same as they were in the hospital.

## 2024-01-15 NOTE — Telephone Encounter (Signed)
 Spoke with patient, relayed message from EM. Pt understands to take the Imdur  60mg  daily, monitor sx and to keep scheduled appt. No other concerns to address at this time.

## 2024-01-18 DIAGNOSIS — J449 Chronic obstructive pulmonary disease, unspecified: Secondary | ICD-10-CM | POA: Diagnosis not present

## 2024-01-18 DIAGNOSIS — R490 Dysphonia: Secondary | ICD-10-CM | POA: Diagnosis not present

## 2024-01-18 DIAGNOSIS — E785 Hyperlipidemia, unspecified: Secondary | ICD-10-CM | POA: Diagnosis not present

## 2024-01-19 MED ORDER — ISOSORBIDE MONONITRATE ER 30 MG PO TB24: 30.0000 mg | ORAL_TABLET | Freq: Two times a day (BID) | ORAL | 3 refills | Status: DC

## 2024-01-22 ENCOUNTER — Ambulatory Visit (INDEPENDENT_AMBULATORY_CARE_PROVIDER_SITE_OTHER): Admitting: Medical-Surgical

## 2024-01-22 ENCOUNTER — Encounter: Payer: Self-pay | Admitting: Medical-Surgical

## 2024-01-22 VITALS — BP 115/74 | HR 76 | Resp 20 | Ht 65.0 in | Wt 181.1 lb

## 2024-01-22 DIAGNOSIS — E782 Mixed hyperlipidemia: Secondary | ICD-10-CM | POA: Diagnosis not present

## 2024-01-22 DIAGNOSIS — Z125 Encounter for screening for malignant neoplasm of prostate: Secondary | ICD-10-CM | POA: Diagnosis not present

## 2024-01-22 DIAGNOSIS — Z0000 Encounter for general adult medical examination without abnormal findings: Secondary | ICD-10-CM | POA: Diagnosis not present

## 2024-01-22 NOTE — Progress Notes (Signed)
 Complete physical exam  Patient: Brandon Phillips.   DOB: 05/19/1966   58 y.o. Male  MRN: [id number]  Subjective:    Chief Complaint  Patient presents with   Annual Exam    Brandon Phillips. is a 58 y.o. male who presents today for a complete physical exam. He reports consuming a general diet. The patient has a physically strenuous job, but has no regular exercise apart from work.  He generally feels poorly. He reports sleeping well. He does not have additional problems to discuss today.    Most recent fall risk assessment:    07/17/2023    1:38 PM  Fall Risk   Falls in the past year? 1  Number falls in past yr: 0  Injury with Fall? 0  Risk for fall due to : History of fall(s)  Follow up Falls evaluation completed     Most recent depression screenings:    01/22/2024    4:00 PM 07/03/2023    2:39 PM  PHQ 2/9 Scores  PHQ - 2 Score 0 0  PHQ- 9 Score  0    Vision:Not within last year , Dental: No current dental problems and Receives regular dental care, and PSA: Agrees to PSA testing    Patient Care Team: Willo Mini, NP as PCP - General (Nurse Practitioner) Croitoru, Jerel, MD as PCP - Cardiology (Cardiology) Timmy Maude SAUNDERS, MD as Medical Oncologist (Oncology)   Outpatient Medications Prior to Visit  Medication Sig   albuterol  (PROVENTIL ) (2.5 MG/3ML) 0.083% nebulizer solution Take 3 mLs (2.5 mg total) by nebulization every 6 (six) hours as needed for wheezing or shortness of breath.   albuterol  (VENTOLIN  HFA) 108 (90 Base) MCG/ACT inhaler Inhale 2 puffs into the lungs every 6 (six) hours as needed for wheezing.   amLODipine  (NORVASC ) 5 MG tablet Take 1 tablet (5 mg total) by mouth daily.   apixaban  (ELIQUIS ) 2.5 MG TABS tablet Take 1 tablet (2.5 mg total) by mouth 2 (two) times daily.   aspirin  EC 81 MG tablet Take 1 tablet (81 mg total) by mouth daily. Swallow whole.   atorvastatin  (LIPITOR) 80 MG tablet Take 1 tablet (80 mg total) by mouth daily.    budesonide -glycopyrrolate-formoterol  (BREZTRI  AEROSPHERE) 160-9-4.8 MCG/ACT AERO inhaler Inhale 2 puffs into the lungs in the morning and at bedtime.   COENZYME Q10 PO Take 100 mg by mouth in the morning.   folic acid  (FOLVITE ) 1 MG tablet TAKE 1 TABLET BY MOUTH DAILY. START 7 DAYS BEFORE PEMETREXED  CHEMOTHERAPY, CONTINUE UNTIL 21 DAYS AFTER PEMETREXED  COMPLETED   gabapentin  (NEURONTIN ) 300 MG capsule Take 1 capsule (300 mg total) by mouth 3 (three) times daily.   GUAIFENESIN 1200 PO Take 1,200 mg by mouth in the morning and at bedtime.   isosorbide  mononitrate (IMDUR ) 30 MG 24 hr tablet Take 1 tablet (30 mg total) by mouth 2 (two) times daily.   lidocaine -prilocaine  (EMLA ) cream Apply a dime size to port-a-cath 1-2 hours prior to access. Cover with saran wrap.   Multiple Vitamin (MULTIVITAMIN) capsule Take 1 capsule by mouth at bedtime.   telmisartan  (MICARDIS ) 20 MG tablet Take 20 mg by mouth daily.   triamcinolone  cream (KENALOG ) 0.1 % Apply 1 Application topically 2 (two) times daily.   No facility-administered medications prior to visit.    Review of Systems  Constitutional:  Negative for chills, fever, malaise/fatigue and weight loss.  HENT:  Negative for congestion, ear pain, hearing loss, sinus pain and sore throat.  Eyes:  Negative for blurred vision, photophobia and pain.  Respiratory:  Positive for shortness of breath and wheezing. Negative for cough.   Cardiovascular:  Positive for palpitations. Negative for chest pain and leg swelling.  Gastrointestinal:  Negative for abdominal pain, constipation, diarrhea, heartburn, nausea and vomiting.  Genitourinary:  Negative for dysuria, frequency and urgency.  Musculoskeletal:  Negative for falls and neck pain.  Skin:  Negative for itching and rash.  Neurological:  Negative for dizziness, weakness and headaches.  Endo/Heme/Allergies:  Negative for polydipsia. Does not bruise/bleed easily.  Psychiatric/Behavioral:  Positive for  depression. Negative for substance abuse and suicidal ideas. The patient is nervous/anxious. The patient does not have insomnia.      Objective:    BP 115/74 (BP Location: Right Arm, Cuff Size: Normal)   Pulse 76   Resp 20   Ht 5' 5 (1.651 m)   Wt 181 lb 1.3 oz (82.1 kg)   SpO2 91%   BMI 30.13 kg/m    Physical Exam Constitutional:      General: He is not in acute distress.    Appearance: Normal appearance. He is not ill-appearing.  HENT:     Head: Normocephalic and atraumatic.     Right Ear: Tympanic membrane, ear canal and external ear normal. There is no impacted cerumen.     Left Ear: Tympanic membrane, ear canal and external ear normal. There is no impacted cerumen.     Nose: Nose normal. No congestion or rhinorrhea.     Mouth/Throat:     Mouth: Mucous membranes are moist.     Pharynx: No oropharyngeal exudate or posterior oropharyngeal erythema.   Eyes:     General: No scleral icterus.       Right eye: No discharge.        Left eye: No discharge.     Extraocular Movements: Extraocular movements intact.     Conjunctiva/sclera: Conjunctivae normal.     Pupils: Pupils are equal, round, and reactive to light.   Neck:     Thyroid : No thyromegaly.     Vascular: No carotid bruit or JVD.     Trachea: Trachea normal.   Cardiovascular:     Rate and Rhythm: Normal rate and regular rhythm.     Pulses: Normal pulses.     Heart sounds: Normal heart sounds. No murmur heard.    No friction rub. No gallop.  Pulmonary:     Effort: Pulmonary effort is normal. No respiratory distress.     Breath sounds: Wheezing (Scattered expiratory) present.  Abdominal:     General: Bowel sounds are normal. There is no distension.     Palpations: Abdomen is soft.     Tenderness: There is no abdominal tenderness. There is no guarding.   Musculoskeletal:        General: Normal range of motion.     Cervical back: Normal range of motion and neck supple.  Lymphadenopathy:     Cervical: No  cervical adenopathy.   Skin:    General: Skin is warm and dry.   Neurological:     Mental Status: He is alert and oriented to person, place, and time.     Cranial Nerves: No cranial nerve deficit.   Psychiatric:        Mood and Affect: Mood normal.        Behavior: Behavior normal.        Thought Content: Thought content normal.        Judgment: Judgment normal.  No results found for any visits on 01/22/24.     Assessment & Plan:    Routine Health Maintenance and Physical Exam  Immunization History  Administered Date(s) Administered   PFIZER(Purple Top)SARS-COV-2 Vaccination 08/03/2020, 08/25/2020   PNEUMOCOCCAL CONJUGATE-20 09/24/2023   Pneumococcal Polysaccharide-23 01/15/2018   Tdap 01/15/2018   Zoster Recombinant(Shingrix) 07/30/2019, 11/27/2019    Health Maintenance  Topic Date Due   Hepatitis B Vaccines (1 of 3 - 19+ 3-dose series) Never done   INFLUENZA VACCINE  02/27/2024   Fecal DNA (Cologuard)  12/02/2026   DTaP/Tdap/Td (2 - Td or Tdap) 01/16/2028   Pneumococcal Vaccine 54-78 Years old  Completed   Hepatitis C Screening  Completed   HIV Screening  Completed   Zoster Vaccines- Shingrix  Completed   HPV VACCINES  Aged Out   Meningococcal B Vaccine  Aged Out   Lung Cancer Screening  Discontinued   COVID-19 Vaccine  Discontinued    Discussed health benefits of physical activity, and encouraged him to engage in regular exercise appropriate for his age and condition.  1. Annual physical exam (Primary) Patient is up-to-date on labs so no need to check basics today.  Up-to-date on most preventative care.  Recommend updating eye exam whenever possible.  Wellness information provided with AVS.  2. Mixed hyperlipidemia Up-to-date on lipid checks. Continue Lipitor 80 mg daily as recommended by cardiology.  3. Prostate cancer screening Reviewed screening recommendations.  Discussed risks versus benefits.  He does have a lung cancer so is at a higher risk so plan  to check PSA today. - PSA Total (Reflex To Free)  Return in about 1 year (around 01/21/2025) for annual physical exam or sooner if needed.   Kaysa Roulhac, NP

## 2024-01-22 NOTE — Patient Instructions (Signed)

## 2024-01-23 ENCOUNTER — Inpatient Hospital Stay: Attending: Hematology & Oncology

## 2024-01-23 ENCOUNTER — Inpatient Hospital Stay

## 2024-01-23 ENCOUNTER — Ambulatory Visit: Payer: Self-pay | Admitting: Medical-Surgical

## 2024-01-23 ENCOUNTER — Other Ambulatory Visit: Payer: Self-pay

## 2024-01-23 ENCOUNTER — Encounter: Payer: Self-pay | Admitting: Family

## 2024-01-23 ENCOUNTER — Inpatient Hospital Stay (HOSPITAL_BASED_OUTPATIENT_CLINIC_OR_DEPARTMENT_OTHER): Admitting: Family

## 2024-01-23 VITALS — BP 105/73 | HR 67 | Resp 18

## 2024-01-23 VITALS — BP 119/78 | HR 69 | Temp 97.7°F | Resp 18 | Wt 182.3 lb

## 2024-01-23 DIAGNOSIS — C3432 Malignant neoplasm of lower lobe, left bronchus or lung: Secondary | ICD-10-CM | POA: Insufficient documentation

## 2024-01-23 DIAGNOSIS — Z5112 Encounter for antineoplastic immunotherapy: Secondary | ICD-10-CM | POA: Insufficient documentation

## 2024-01-23 DIAGNOSIS — I2699 Other pulmonary embolism without acute cor pulmonale: Secondary | ICD-10-CM | POA: Diagnosis not present

## 2024-01-23 DIAGNOSIS — Z7901 Long term (current) use of anticoagulants: Secondary | ICD-10-CM | POA: Diagnosis not present

## 2024-01-23 LAB — CMP (CANCER CENTER ONLY)
ALT: 37 U/L (ref 0–44)
AST: 21 U/L (ref 15–41)
Albumin: 4.3 g/dL (ref 3.5–5.0)
Alkaline Phosphatase: 62 U/L (ref 38–126)
Anion gap: 7 (ref 5–15)
BUN: 11 mg/dL (ref 6–20)
CO2: 31 mmol/L (ref 22–32)
Calcium: 9.2 mg/dL (ref 8.9–10.3)
Chloride: 100 mmol/L (ref 98–111)
Creatinine: 1.02 mg/dL (ref 0.61–1.24)
GFR, Estimated: >60 mL/min (ref >60)
Glucose, Bld: 101 mg/dL — ABNORMAL HIGH (ref 70–99)
Potassium: 4 mmol/L (ref 3.5–5.1)
Sodium: 138 mmol/L (ref 135–145)
Total Bilirubin: 0.6 mg/dL (ref 0.0–1.2)
Total Protein: 6.7 g/dL (ref 6.5–8.1)

## 2024-01-23 LAB — CBC WITH DIFFERENTIAL (CANCER CENTER ONLY)
Abs Immature Granulocytes: 0.02 10*3/uL (ref 0.00–0.07)
Basophils Absolute: 0 10*3/uL (ref 0.0–0.1)
Basophils Relative: 1 %
Eosinophils Absolute: 0.1 10*3/uL (ref 0.0–0.5)
Eosinophils Relative: 2 %
HCT: 43.5 % (ref 39.0–52.0)
Hemoglobin: 14.8 g/dL (ref 13.0–17.0)
Immature Granulocytes: 0 %
Lymphocytes Relative: 10 %
Lymphs Abs: 0.8 10*3/uL (ref 0.7–4.0)
MCH: 29.2 pg (ref 26.0–34.0)
MCHC: 34 g/dL (ref 30.0–36.0)
MCV: 85.8 fL (ref 80.0–100.0)
Monocytes Absolute: 0.7 10*3/uL (ref 0.1–1.0)
Monocytes Relative: 9 %
Neutro Abs: 5.8 10*3/uL (ref 1.7–7.7)
Neutrophils Relative %: 78 %
Platelet Count: 216 10*3/uL (ref 150–400)
RBC: 5.07 MIL/uL (ref 4.22–5.81)
RDW: 13.1 % (ref 11.5–15.5)
WBC Count: 7.5 10*3/uL (ref 4.0–10.5)
nRBC: 0 % (ref 0.0–0.2)

## 2024-01-23 LAB — PSA TOTAL (REFLEX TO FREE): Prostate Specific Ag, Serum: 1.1 ng/mL (ref 0.0–4.0)

## 2024-01-23 LAB — SPECIMEN STATUS REPORT

## 2024-01-23 LAB — TSH: TSH: 1.26 u[IU]/mL (ref 0.350–4.500)

## 2024-01-23 MED ORDER — HEPARIN SOD (PORK) LOCK FLUSH 100 UNIT/ML IV SOLN
500.0000 [IU] | Freq: Once | INTRAVENOUS | Status: AC | PRN
  Administered 2024-01-23: 500 [IU]

## 2024-01-23 MED ORDER — SODIUM CHLORIDE 0.9 % IV SOLN: Freq: Once | INTRAVENOUS | Status: AC

## 2024-01-23 MED ORDER — SODIUM CHLORIDE 0.9 % IV SOLN
1500.0000 mg | Freq: Once | INTRAVENOUS | Status: AC
  Administered 2024-01-23: 1500 mg via INTRAVENOUS
  Filled 2024-01-23: qty 30

## 2024-01-23 MED ORDER — SODIUM CHLORIDE 0.9% FLUSH
10.0000 mL | INTRAVENOUS | Status: DC | PRN
  Administered 2024-01-23: 10 mL

## 2024-01-23 NOTE — Patient Instructions (Signed)

## 2024-01-23 NOTE — Progress Notes (Signed)
 Hematology and Oncology Follow Up Visit  Brandon Phillips [id number] 24-Oct-1965 58 y.o. 01/23/2024   Principle Diagnosis:  Durvalumab  1500 mg IV monthly-s/p cycle 11 -- start on 02/17/2023--no molecular target is via liquid biopsy  Pulmonary embolism - right subsegmental -12/04/2022  Past Therapy: Carbo/Alimta + XRT --  Start on 09/23/2022 Status post radiosurgery-completed on 02/11/2023   Current Therapy:        Durvalumab  1500 mg IV monthly-s/p cycle 11 -- start on 02/17/2023 Eliquis  5 mg PO BID - started 12/06/2022 - changed to 2.5 mg PO  BID on 05/12/2023 - present   Interim History:  Brandon Phillips is here today with his wife for follow-up and treatment. He is doing well. Cardiology increased his IMDUR  to help with coronary vasospasms and associated chest pain.  Mild SOB and fatigue with exertion.  He has not noted any blood loss on maintenance Eliquis . No abnormal bruising, no petechiae.  No fever, chills, n/v, cough, rash, dizziness, palpitations, abdominal pain or changes in bowel or bladder habits.  No swelling, numbness or tingling in his extremities.  No falls or syncope reported.  Appetite and hydration are great. Weight is stable at 182 lbs.   ECOG Performance Status: 1 - Symptomatic but completely ambulatory  Medications:  Allergies as of 01/23/2024       Reactions   Imdur  [isosorbide  Nitrate] Other (See Comments)   Headache        Medication List        Accurate as of January 23, 2024  9:56 AM. If you have any questions, ask your nurse or doctor.          albuterol  108 (90 Base) MCG/ACT inhaler Commonly known as: VENTOLIN  HFA Inhale 2 puffs into the lungs every 6 (six) hours as needed for wheezing.   albuterol  (2.5 MG/3ML) 0.083% nebulizer solution Commonly known as: PROVENTIL  Take 3 mLs (2.5 mg total) by nebulization every 6 (six) hours as needed for wheezing or shortness of breath.   amLODipine  5 MG tablet Commonly known as: NORVASC  Take 1 tablet (5  mg total) by mouth daily.   apixaban  2.5 MG Tabs tablet Commonly known as: ELIQUIS  Take 1 tablet (2.5 mg total) by mouth 2 (two) times daily.   aspirin  EC 81 MG tablet Take 1 tablet (81 mg total) by mouth daily. Swallow whole.   atorvastatin  80 MG tablet Commonly known as: LIPITOR Take 1 tablet (80 mg total) by mouth daily.   Breztri  Aerosphere 160-9-4.8 MCG/ACT Aero inhaler Generic drug: budesonide -glycopyrrolate-formoterol  Inhale 2 puffs into the lungs in the morning and at bedtime.   COENZYME Q10 PO Take 100 mg by mouth in the morning.   folic acid  1 MG tablet Commonly known as: FOLVITE  TAKE 1 TABLET BY MOUTH DAILY. START 7 DAYS BEFORE PEMETREXED  CHEMOTHERAPY, CONTINUE UNTIL 21 DAYS AFTER PEMETREXED  COMPLETED   gabapentin  300 MG capsule Commonly known as: NEURONTIN  Take 1 capsule (300 mg total) by mouth 3 (three) times daily.   GUAIFENESIN 1200 PO Take 1,200 mg by mouth in the morning and at bedtime.   isosorbide  mononitrate 30 MG 24 hr tablet Commonly known as: IMDUR  Take 1 tablet (30 mg total) by mouth 2 (two) times daily.   lidocaine -prilocaine  cream Commonly known as: EMLA  Apply a dime size to port-a-cath 1-2 hours prior to access. Cover with saran wrap.   multivitamin capsule Take 1 capsule by mouth at bedtime.   telmisartan  20 MG tablet Commonly known as: MICARDIS  Take 20 mg by mouth  daily.   triamcinolone  cream 0.1 % Commonly known as: KENALOG  Apply 1 Application topically 2 (two) times daily.        Allergies:  Allergies  Allergen Reactions   Imdur  [Isosorbide  Nitrate] Other (See Comments)    Headache    Past Medical History, Surgical history, Social history, and Family History were reviewed and updated.  Review of Systems: All other 10 point review of systems is negative.   Physical Exam:  weight is 182 lb 4.8 oz (82.7 kg). His oral temperature is 97.7 F (36.5 C). His blood pressure is 119/78 and his pulse is 69. His respiration is 18  and oxygen  saturation is 91%.   Wt Readings from Last 3 Encounters:  01/23/24 182 lb 4.8 oz (82.7 kg)  01/22/24 181 lb 1.3 oz (82.1 kg)  12/26/23 177 lb (80.3 kg)    Ocular: Sclerae unicteric, pupils equal, round and reactive to light Ear-nose-throat: Oropharynx clear, dentition fair Lymphatic: No cervical or supraclavicular adenopathy Lungs no rales or rhonchi, good excursion bilaterally Heart regular rate and rhythm, no murmur appreciated Abd soft, nontender, positive bowel sounds MSK no focal spinal tenderness, no joint edema Neuro: non-focal, well-oriented, appropriate affect Breasts: Deferred   Lab Results  Component Value Date   WBC 7.5 01/23/2024   HGB 14.8 01/23/2024   HCT 43.5 01/23/2024   MCV 85.8 01/23/2024   PLT 216 01/23/2024   No results found for: FERRITIN, IRON, TIBC, UIBC, IRONPCTSAT Lab Results  Component Value Date   RBC 5.07 01/23/2024   No results found for: KPAFRELGTCHN, LAMBDASER, KAPLAMBRATIO No results found for: IGGSERUM, IGA, IGMSERUM No results found for: STEPHANY CARLOTA BENSON MARKEL EARLA JOANNIE DOC VICK, SPEI   Chemistry      Component Value Date/Time   NA 138 01/23/2024 0915   NA 139 12/09/2023 0830   K 4.0 01/23/2024 0915   CL 100 01/23/2024 0915   CO2 31 01/23/2024 0915   BUN 11 01/23/2024 0915   BUN 12 12/09/2023 0830   CREATININE 1.02 01/23/2024 0915   CREATININE 1.05 01/11/2022 0000      Component Value Date/Time   CALCIUM  9.2 01/23/2024 0915   ALKPHOS 62 01/23/2024 0915   AST 21 01/23/2024 0915   ALT 37 01/23/2024 0915   BILITOT 0.6 01/23/2024 0915       Impression and Plan:  Brandon Phillips is a pleasant 58 yo caucasian gentleman with a 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  Again, he does not wish to have surgery. He has seen Dr. Kerrin of thoracic surgery.  We will proceed with treatment today as planned.  PET scan in 2 weeks.  Follow-up in 1 month  with MD.   Lauraine Pepper, NP 6/27/20259:56 AM

## 2024-01-24 LAB — T4: T4, Total: 7.6 ug/dL (ref 4.5–12.0)

## 2024-01-25 DIAGNOSIS — R490 Dysphonia: Secondary | ICD-10-CM | POA: Diagnosis not present

## 2024-01-25 DIAGNOSIS — J449 Chronic obstructive pulmonary disease, unspecified: Secondary | ICD-10-CM | POA: Diagnosis not present

## 2024-01-25 DIAGNOSIS — E785 Hyperlipidemia, unspecified: Secondary | ICD-10-CM | POA: Diagnosis not present

## 2024-02-13 ENCOUNTER — Encounter (HOSPITAL_COMMUNITY)
Admission: RE | Admit: 2024-02-13 | Discharge: 2024-02-13 | Disposition: A | Discharge status: Home / Self Care | Source: Ambulatory Visit | Attending: Family | Admitting: Family

## 2024-02-13 ENCOUNTER — Encounter: Payer: Self-pay | Admitting: Hematology & Oncology

## 2024-02-13 DIAGNOSIS — C3432 Malignant neoplasm of lower lobe, left bronchus or lung: Secondary | ICD-10-CM | POA: Diagnosis not present

## 2024-02-13 DIAGNOSIS — R918 Other nonspecific abnormal finding of lung field: Secondary | ICD-10-CM | POA: Diagnosis not present

## 2024-02-13 DIAGNOSIS — C3490 Malignant neoplasm of unspecified part of unspecified bronchus or lung: Secondary | ICD-10-CM | POA: Diagnosis not present

## 2024-02-13 LAB — GLUCOSE, CAPILLARY: Glucose-Capillary: 109 mg/dL — ABNORMAL HIGH (ref 70–99)

## 2024-02-13 MED ORDER — FLUDEOXYGLUCOSE F - 18 (FDG) INJECTION
9.0600 | Freq: Once | INTRAVENOUS | Status: AC
  Administered 2024-02-13: 9.06 via INTRAVENOUS

## 2024-02-16 ENCOUNTER — Other Ambulatory Visit: Payer: Self-pay | Admitting: Hematology & Oncology

## 2024-02-17 DIAGNOSIS — J449 Chronic obstructive pulmonary disease, unspecified: Secondary | ICD-10-CM | POA: Diagnosis not present

## 2024-02-17 DIAGNOSIS — R490 Dysphonia: Secondary | ICD-10-CM | POA: Diagnosis not present

## 2024-02-17 DIAGNOSIS — E785 Hyperlipidemia, unspecified: Secondary | ICD-10-CM | POA: Diagnosis not present

## 2024-02-18 ENCOUNTER — Ambulatory Visit: Payer: Self-pay | Admitting: Hematology & Oncology

## 2024-02-18 ENCOUNTER — Encounter: Payer: Self-pay | Admitting: *Deleted

## 2024-02-20 ENCOUNTER — Inpatient Hospital Stay: Attending: Hematology & Oncology

## 2024-02-20 ENCOUNTER — Inpatient Hospital Stay

## 2024-02-20 ENCOUNTER — Inpatient Hospital Stay: Admitting: Hematology & Oncology

## 2024-02-20 ENCOUNTER — Encounter: Payer: Self-pay | Admitting: Hematology & Oncology

## 2024-02-20 VITALS — BP 118/80 | HR 87 | Temp 98.2°F | Resp 19 | Wt 181.1 lb

## 2024-02-20 DIAGNOSIS — C3432 Malignant neoplasm of lower lobe, left bronchus or lung: Secondary | ICD-10-CM

## 2024-02-20 DIAGNOSIS — Z79620 Long term (current) use of immunosuppressive biologic: Secondary | ICD-10-CM | POA: Insufficient documentation

## 2024-02-20 DIAGNOSIS — Z5112 Encounter for antineoplastic immunotherapy: Secondary | ICD-10-CM | POA: Diagnosis not present

## 2024-02-20 DIAGNOSIS — Z87891 Personal history of nicotine dependence: Secondary | ICD-10-CM | POA: Insufficient documentation

## 2024-02-20 LAB — CMP (CANCER CENTER ONLY)
ALT: 47 U/L — ABNORMAL HIGH (ref 0–44)
AST: 29 U/L (ref 15–41)
Albumin: 4.2 g/dL (ref 3.5–5.0)
Alkaline Phosphatase: 69 U/L (ref 38–126)
Anion gap: 12 (ref 5–15)
BUN: 10 mg/dL (ref 6–20)
CO2: 27 mmol/L (ref 22–32)
Calcium: 9 mg/dL (ref 8.9–10.3)
Chloride: 100 mmol/L (ref 98–111)
Creatinine: 1.09 mg/dL (ref 0.61–1.24)
GFR, Estimated: >60 mL/min (ref >60)
Glucose, Bld: 173 mg/dL — ABNORMAL HIGH (ref 70–99)
Potassium: 3.7 mmol/L (ref 3.5–5.1)
Sodium: 139 mmol/L (ref 135–145)
Total Bilirubin: 0.6 mg/dL (ref 0.0–1.2)
Total Protein: 6.9 g/dL (ref 6.5–8.1)

## 2024-02-20 LAB — CBC WITH DIFFERENTIAL (CANCER CENTER ONLY)
Abs Immature Granulocytes: 0.03 K/uL (ref 0.00–0.07)
Basophils Absolute: 0.1 K/uL (ref 0.0–0.1)
Basophils Relative: 1 %
Eosinophils Absolute: 0.2 K/uL (ref 0.0–0.5)
Eosinophils Relative: 3 %
HCT: 43.7 % (ref 39.0–52.0)
Hemoglobin: 15 g/dL (ref 13.0–17.0)
Immature Granulocytes: 0 %
Lymphocytes Relative: 11 %
Lymphs Abs: 0.8 K/uL (ref 0.7–4.0)
MCH: 29.5 pg (ref 26.0–34.0)
MCHC: 34.3 g/dL (ref 30.0–36.0)
MCV: 85.9 fL (ref 80.0–100.0)
Monocytes Absolute: 0.4 K/uL (ref 0.1–1.0)
Monocytes Relative: 6 %
Neutro Abs: 5.7 K/uL (ref 1.7–7.7)
Neutrophils Relative %: 79 %
Platelet Count: 201 K/uL (ref 150–400)
RBC: 5.09 MIL/uL (ref 4.22–5.81)
RDW: 13.3 % (ref 11.5–15.5)
WBC Count: 7.2 K/uL (ref 4.0–10.5)
nRBC: 0 % (ref 0.0–0.2)

## 2024-02-20 LAB — TSH: TSH: 1.35 u[IU]/mL (ref 0.350–4.500)

## 2024-02-20 MED ORDER — SODIUM CHLORIDE 0.9% FLUSH
10.0000 mL | INTRAVENOUS | Status: DC | PRN
  Administered 2024-02-20: 10 mL

## 2024-02-20 MED ORDER — SODIUM CHLORIDE 0.9 % IV SOLN
1500.0000 mg | Freq: Once | INTRAVENOUS | Status: AC
  Administered 2024-02-20: 1500 mg via INTRAVENOUS
  Filled 2024-02-20: qty 30

## 2024-02-20 MED ORDER — SODIUM CHLORIDE 0.9 % IV SOLN: Freq: Once | INTRAVENOUS | Status: AC

## 2024-02-20 MED ORDER — HEPARIN SOD (PORK) LOCK FLUSH 100 UNIT/ML IV SOLN
500.0000 [IU] | Freq: Once | INTRAVENOUS | Status: AC | PRN
  Administered 2024-02-20: 500 [IU]

## 2024-02-20 NOTE — Patient Instructions (Signed)

## 2024-02-20 NOTE — Progress Notes (Signed)
 [facility] Hematology and Oncology Follow Up Visit  Brandon Phillips [id number] 1966-03-25 58 y.o. 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 twice daily-start on 12/06/2022 -changed to 2.5 mg p.o. twice daily on 05/12/2023 Status post radiosurgery-completed on 02/11/2023 Durvalumab  1500 mg IV monthly-s/p cycle #10-- start on 02/17/2023     Interim History:  Brandon Phillips is back for follow-up.  He really looks fantastic.  He really has no complaints.  We did do a PET scan on him.  The PET scan was done a couple weeks ago.  Thankfully, the PET scan did not show anything to look like active disease.  He has done incredibly well.  He has tolerated the Durvalumab  nicely.  He has had no cardiac issues.  He does wear oxygen  at nighttime.  He is on low-dose Eliquis .  He continues on Lipitor.  He has had no issues with work.  He has had no change in bowel or bladder habits.  He has had no rashes.  There is been no diarrhea.  He has had no leg swelling.  He has had no headache.  His last TSH back in June was 1.26.  Overall, I will have to see that his performance status is probably ECOG 1.    Wt Readings from Last 3 Encounters:  02/20/24 181 lb 1.3 oz (82.1 kg)  01/23/24 182 lb 4.8 oz (82.7 kg)  01/22/24 181 lb 1.3 oz (82.1 kg)     Medications:  Current Outpatient Medications:    albuterol  (PROVENTIL ) (2.5 MG/3ML) 0.083% nebulizer solution, Take 3 mLs (2.5 mg total) by nebulization every 6 (six) hours as needed for wheezing or shortness of breath., Disp: 75 mL, Rfl: 5   albuterol  (VENTOLIN  HFA) 108 (90 Base) MCG/ACT inhaler, Inhale 2 puffs into the lungs every 6 (six) hours as needed for wheezing., Disp: 2 each, Rfl: 11   amLODipine  (NORVASC ) 5 MG tablet, Take 1  tablet (5 mg total) by mouth daily., Disp: 90 tablet, Rfl: 3   apixaban  (ELIQUIS ) 2.5 MG TABS tablet, Take 1 tablet (2.5 mg total) by mouth 2 (two) times daily., Disp: 180 tablet, Rfl: 3   aspirin  EC 81 MG tablet, Take 1 tablet (81 mg total) by mouth daily. Swallow whole., Disp: 90 tablet, Rfl: 3   atorvastatin  (LIPITOR) 80 MG tablet, Take 1 tablet (80 mg total) by mouth daily., Disp: 90 tablet, Rfl: 3   budesonide -glycopyrrolate-formoterol  (BREZTRI  AEROSPHERE) 160-9-4.8 MCG/ACT AERO inhaler, Inhale 2 puffs into the lungs in the morning and at bedtime., Disp: 1 each, Rfl: 11   COENZYME Q10 PO, Take 100 mg by mouth in the morning., Disp: , Rfl:    folic acid  (FOLVITE ) 1 MG tablet, TAKE 1 TABLET BY MOUTH DAILY. START 7 DAYS BEFORE PEMETREXED  CHEMOTHERAPY, CONTINUE UNTIL 21 DAYS AFTER PEMETREXED  COMPLETED, Disp: 30 tablet, Rfl: 8   gabapentin  (NEURONTIN ) 300 MG capsule, TAKE 1 CAPSULE BY MOUTH 3 TIMES A DAY, Disp: 90 capsule, Rfl: 3   GUAIFENESIN 1200 PO, Take 1,200 mg by mouth in the morning and at bedtime., Disp: , Rfl:    isosorbide  mononitrate (IMDUR ) 30 MG 24 hr tablet, Take 1 tablet (30 mg total) by mouth 2 (two) times daily., Disp: 90 tablet, Rfl: 3   lidocaine -prilocaine  (EMLA ) cream, Apply a  dime size to port-a-cath 1-2 hours prior to access. Cover with saran wrap., Disp: 30 g, Rfl: 2   Multiple Vitamin (MULTIVITAMIN) capsule, Take 1 capsule by mouth at bedtime., Disp: , Rfl:    telmisartan  (MICARDIS ) 20 MG tablet, Take 20 mg by mouth daily., Disp: , Rfl:    triamcinolone  cream (KENALOG ) 0.1 %, Apply 1 Application topically 2 (two) times daily., Disp: 45 g, Rfl: 3 No current facility-administered medications for this visit.  Facility-Administered Medications Ordered in Other Visits:    0.9 %  sodium chloride  infusion, , Intravenous, Once, Monterrio Gerst, Maude SAUNDERS, MD   durvalumab  (IMFINZI ) 1,500 mg in sodium chloride  0.9 % 100 mL chemo infusion, 1,500 mg, Intravenous, Once, Constance Hackenberg, Maude SAUNDERS, MD    heparin  lock flush 100 unit/mL, 500 Units, Intracatheter, Once PRN,  Daquila R, MD   sodium chloride  flush (NS) 0.9 % injection 10 mL, 10 mL, Intracatheter, PRN, Trudy Kory R, MD  Allergies:  Allergies  Allergen Reactions   Imdur  [Isosorbide  Nitrate] Other (See Comments)    Headache    Past Medical History, Surgical history, Social history, and Family History were reviewed and updated.  Review of Systems: Review of Systems  Constitutional: Negative.   HENT:  Negative.    Eyes: Negative.   Respiratory:  Positive for cough.   Cardiovascular: Negative.   Gastrointestinal: Negative.   Endocrine: Negative.   Genitourinary: Negative.    Musculoskeletal: Negative.   Skin: Negative.   Neurological: Negative.   Hematological: Negative.   Psychiatric/Behavioral: Negative.      Physical Exam: His vital signs are temperature 98.2.  Pulse 81.  Blood pressure 118/80.  Weight is 181 pounds.     Wt Readings from Last 3 Encounters:  02/20/24 181 lb 1.3 oz (82.1 kg)  01/23/24 182 lb 4.8 oz (82.7 kg)  01/22/24 181 lb 1.3 oz (82.1 kg)    Physical Exam Vitals reviewed.  HENT:     Head: Normocephalic and atraumatic.  Eyes:     Pupils: Pupils are equal, round, and reactive to light.  Cardiovascular:     Rate and Rhythm: Normal rate and regular rhythm.     Heart sounds: Normal heart sounds.  Pulmonary:     Effort: Pulmonary effort is normal.     Breath sounds: Normal breath sounds.  Abdominal:     General: Bowel sounds are normal.     Palpations: Abdomen is soft.  Musculoskeletal:        General: No tenderness or deformity. Normal range of motion.     Cervical back: Normal range of motion.  Lymphadenopathy:     Cervical: No cervical adenopathy.  Skin:    General: Skin is warm and dry.     Findings: No erythema or rash.  Neurological:     Mental Status: He is alert and oriented to person, place, and time.  Psychiatric:        Behavior: Behavior normal.         Thought Content: Thought content normal.        Judgment: Judgment normal.     Lab Results  Component Value Date   WBC 7.2 02/20/2024   HGB 15.0 02/20/2024   HCT 43.7 02/20/2024   MCV 85.9 02/20/2024   PLT 201 02/20/2024     Chemistry      Component Value Date/Time   NA 139 02/20/2024 1027   NA 139 12/09/2023 0830   K 3.7 02/20/2024 1027   CL 100 02/20/2024 1027   CO2  27 02/20/2024 1027   BUN 10 02/20/2024 1027   BUN 12 12/09/2023 0830   CREATININE 1.09 02/20/2024 1027   CREATININE 1.05 01/11/2022 0000      Component Value Date/Time   CALCIUM  9.0 02/20/2024 1027   ALKPHOS 69 02/20/2024 1027   AST 29 02/20/2024 1027   ALT 47 (H) 02/20/2024 1027   BILITOT 0.6 02/20/2024 1027       Impression and Plan: Brandon Phillips is a very nice 58 year old white male.  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  Again, he does not wish to have surgery.  He has seen Dr. Kerrin of thoracic surgery.  Again, we have him on maintenance therapy with Durvalumab .  He has had 1 year of Durvalumab .  He would like to continue the Durvalumab .  We could certainly do this.  I think probably will be reasonable is to spread his intervals out a little bit.  I probably would treat him every 6 weeks for right now.  I would then do another PET scan in about 3 months.  If that PET scan looks okay, then we might move his appointments out to every 2 months.  I will now plan to get him back in about 6 weeks.   Maude JONELLE Crease, MD 7/25/202511:19 AM

## 2024-02-20 NOTE — Patient Instructions (Signed)
 [facility] - A DEPT OF [facility]  Discharge Instructions: Thank you for choosing [facility] to provide your oncology and hematology care.   If you have a lab appointment with the Cancer Center, please go directly to the Cancer Center and check in at the registration area.  Wear comfortable clothing and clothing appropriate for easy access to any Portacath or PICC line.   We strive to give you quality time with your provider. You may need to reschedule your appointment if you arrive late (15 or more minutes).  Arriving late affects you and other patients whose appointments are after yours.  Also, if you miss three or more appointments without notifying the office, you may be dismissed from the clinic at the provider's discretion.      For prescription refill requests, have your pharmacy contact our office and allow 72 hours for refills to be completed.    Today you received the following chemotherapy and/or immunotherapy agents:  Imfinzi      To help prevent nausea and vomiting after your treatment, we encourage you to take your nausea medication as directed.  BELOW ARE SYMPTOMS THAT SHOULD BE REPORTED IMMEDIATELY: *FEVER GREATER THAN 100.4 F (38 C) OR HIGHER *CHILLS OR SWEATING *NAUSEA AND VOMITING THAT IS NOT CONTROLLED WITH YOUR NAUSEA MEDICATION *UNUSUAL SHORTNESS OF BREATH *UNUSUAL BRUISING OR BLEEDING *URINARY PROBLEMS (pain or burning when urinating, or frequent urination) *BOWEL PROBLEMS (unusual diarrhea, constipation, pain near the anus) TENDERNESS IN MOUTH AND THROAT WITH OR WITHOUT PRESENCE OF ULCERS (sore throat, sores in mouth, or a toothache) UNUSUAL RASH, SWELLING OR PAIN  UNUSUAL VAGINAL DISCHARGE OR ITCHING   Items with * indicate a potential emergency and should be followed up as soon as possible or go to the Emergency Department if any problems should occur.  Please show the CHEMOTHERAPY ALERT CARD or IMMUNOTHERAPY  ALERT CARD at check-in to the Emergency Department and triage nurse. Should you have questions after your visit or need to cancel or reschedule your appointment, please contact [facility] - A DEPT OF [facility]  [phone] and follow the prompts.  Office hours are 8:00 a.m. to 4:30 p.m. Monday - Friday. Please note that voicemails left after 4:00 p.m. may not be returned until the following business day.  We are closed weekends and major holidays. You have access to a nurse at all times for urgent questions. Please call the main number to the clinic [phone] and follow the prompts.  For any non-urgent questions, you may also contact your provider using MyChart. We now offer e-Visits for anyone 36 and older to request care online for non-urgent symptoms. For details visit [email or web address].   Also download the MyChart app! Go to the app store, search "MyChart", open the app, select [place], and log in with your MyChart username and password.

## 2024-02-21 LAB — T4: T4, Total: 7.1 ug/dL (ref 4.5–12.0)

## 2024-02-24 DIAGNOSIS — J449 Chronic obstructive pulmonary disease, unspecified: Secondary | ICD-10-CM | POA: Diagnosis not present

## 2024-02-24 DIAGNOSIS — E785 Hyperlipidemia, unspecified: Secondary | ICD-10-CM | POA: Diagnosis not present

## 2024-02-24 DIAGNOSIS — R490 Dysphonia: Secondary | ICD-10-CM | POA: Diagnosis not present

## 2024-03-16 DIAGNOSIS — E785 Hyperlipidemia, unspecified: Secondary | ICD-10-CM | POA: Diagnosis not present

## 2024-03-16 DIAGNOSIS — Z79899 Other long term (current) drug therapy: Secondary | ICD-10-CM | POA: Diagnosis not present

## 2024-03-17 LAB — HEPATIC FUNCTION PANEL
ALT: 39 IU/L (ref 0–44)
AST: 24 IU/L (ref 0–40)
Albumin: 4.6 g/dL (ref 3.8–4.9)
Alkaline Phosphatase: 81 IU/L (ref 44–121)
Bilirubin Total: 0.6 mg/dL (ref 0.0–1.2)
Bilirubin, Direct: 0.17 mg/dL (ref 0.00–0.40)
Total Protein: 7.4 g/dL (ref 6.0–8.5)

## 2024-03-17 LAB — LIPID PANEL
Chol/HDL Ratio: 4.6 ratio (ref 0.0–5.0)
Cholesterol, Total: 181 mg/dL (ref 100–199)
HDL: 39 mg/dL — ABNORMAL LOW (ref >39)
LDL Chol Calc (NIH): 104 mg/dL — ABNORMAL HIGH (ref 0–99)
Triglycerides: 222 mg/dL — ABNORMAL HIGH (ref 0–149)
VLDL Cholesterol Cal: 38 mg/dL (ref 5–40)

## 2024-03-19 DIAGNOSIS — R490 Dysphonia: Secondary | ICD-10-CM | POA: Diagnosis not present

## 2024-03-19 DIAGNOSIS — E785 Hyperlipidemia, unspecified: Secondary | ICD-10-CM | POA: Diagnosis not present

## 2024-03-19 DIAGNOSIS — J449 Chronic obstructive pulmonary disease, unspecified: Secondary | ICD-10-CM | POA: Diagnosis not present

## 2024-03-26 DIAGNOSIS — J449 Chronic obstructive pulmonary disease, unspecified: Secondary | ICD-10-CM | POA: Diagnosis not present

## 2024-03-26 DIAGNOSIS — R490 Dysphonia: Secondary | ICD-10-CM | POA: Diagnosis not present

## 2024-03-26 DIAGNOSIS — E785 Hyperlipidemia, unspecified: Secondary | ICD-10-CM | POA: Diagnosis not present

## 2024-03-30 ENCOUNTER — Other Ambulatory Visit: Payer: Self-pay

## 2024-03-30 DIAGNOSIS — I10 Essential (primary) hypertension: Secondary | ICD-10-CM

## 2024-03-30 DIAGNOSIS — E782 Mixed hyperlipidemia: Secondary | ICD-10-CM

## 2024-03-31 NOTE — Progress Notes (Addendum)
 Cardiology Office Note:  .   Date:  04/13/2024  ID:  Brandon Phillips., DOB 20-Jul-1966, MRN [id number] PCP: Willo Mini, NP  [facility] Providers Cardiologist:  Jerel Balding, MD    History of Present Illness: Brandon   Ekansh Sherk. is a 57 y.o. 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  He presented to the ED on 10/10/2023 in the setting of intermittent chest/jaw pain x 1 week.  Initial EKG showed no acute changes, troponin was negative x 2.  Echocardiogram showed normal LV function, EF 65 to 70% no RWMA, G1 DD, normal RV systolic function, no significant valvular abnormalities.  He was noted to have transient ST elevation on telemetry during episodes of jaw/chest pain.  He was started on amlodipine  and Imdur .  He also had 1 documented episode of NSVT on his home Kardia mobile device.  He was initially started on metoprolol , however this was discontinued to allow for treatment of vasospasm.  Cardiac catheterization revealed nonobstructive CAD.  His home Lipitor dose was increased from 20 mg to 40 mg daily.  He was discharged home in stable condition on 10/14/2023.  Patient comes in with his wife. He had jaw pain at work in Aug and did an EKG on his phone and showed me but it had a lot of artifact. Increase in Imdur  definitely helped. Has appt with pharmacy for elevated trig. Gets at least 6000 steps daily and rides a bike there. Uses portable oxygen  sometimes 3x/week during the day and qHS. Started on steroids and antibiotics this am for COPD exacerbation.    ROS:    Studies Reviewed: Brandon         Prior CV Studies:    Left Heart Catheterization 10/13/23: Hemodynamic data: LVEDP 12 mmHg.  No pressure gradient across the aortic valve.   Angiographic  data: There is diffuse coronary calcification.  Left dominant circulation. LM large-caliber vessel, bifurcates into LAD and CX. LCx: Dominant vessel and a very large caliber vessel giving origin to large OM1, small OM 2, OM 3, large PDA and PL branch distally.  Proximal circumflex has mild 30% stenosis, large OM1 with proximal 30% stenosis. LAD: It is moderately calcified diffusely.  There is diffuse luminal irregularity.  Gives origin to large D1 with a ostial 50 to 60% stenosis.  After the D1 origin, mid LAD has calcific 50% stenosis, mild disease in the mid to distal segment followed by another 50% stenosis in the midsegment.  LAD ends at the apex, D1 and D3 being large vessels with mild disease. RCA: Nondominant, proximal to mid 40% stenosis.   Virtual FFR using CathWorx: Proximal LAD 0.84, distal LAD 0.82, lesions are not hemodynamically significant.         Signed       Transient ST elevations on telemetry               Impression and recommendations: With patient having on and off chest pain with radiation to the jaw and ST elevations noted on the telemetry, suspect coronary vasospasm as etiology.  Adding vasodilator therapy would be appropriate for management of his hypertension and possible coronary spasm.  Risk Assessment/Calculations:             Physical Exam:   VS:  BP 124/78  Pulse 83   Ht 5' 5.5 (1.664 m)   Wt 178 lb 9.6 oz (81 kg)   SpO2 94%   BMI 29.27 kg/m    Orhtostatics: No data found. Wt Readings from Last 3 Encounters:  04/13/24 178 lb 9.6 oz (81 kg)  04/13/24 178 lb 12.8 oz (81.1 kg)  04/05/24 178 lb (80.7 kg)    GEN: Well nourished, well developed in no acute distress NECK: No JVD; No carotid bruits CARDIAC:  RRR, no murmurs, rubs, gallops RESPIRATORY:  decreased breath sounds but clear ABDOMEN: Soft, non-tender, non-distended EXTREMITIES:  No edema; No deformity   ASSESSMENT AND PLAN: .    Nonobstructive CAD/coronary artery vasospasm:   Transient ST elevation on telemetry during episodes of jaw/chest pain. Cardiac catheterization revealed nonobstructive CAD.  Echocardiogram showed normal LV function, EF 65 to 70% no RWMA, G1 DD, normal RV systolic function, no significant valvular abnormalities.  He was started on amlodipine  and Imdur .  He had a similar episode in Aug but EKG on his phone shows a lot of artifact.  symptoms concerning for angina as jaw pain. If it returns more frequently we can increase Imdur .  Continue aspirin , amlodipine , Imdur , and Lipitor.  Addendum: received preop clearance request for DML Bx WITH LASER  Date of Surgery:  Clearance 05/17/24                               Surgeon:  DR. OKEY Surgeon's Group or Practice Name:  [facility]  Phone number:  [phone] Fax number:  [phone]  Patient with known coronary vasospasm but has been overall stable on amlodipine  and Imdur . He should be able to proceed with above surgery without further cardiac testing. Pharmacy team said he could hold eliquis  for 1 day prior.        NSVT: One prior documented episode on home Kardia mobile device, no recurrence.  He denies any palpitations.  Stable off beta-blocker (metoprolol  was discontinued to allow for treatment of vasospasm as above).    Hyperlipidemia: LDL was 104 in 02/2024. Trig 222. Has appt with lipid clinic.    History of PE: Continue Eliquis .    History of lung cancer:  S/p chemo/radiation.  Currently on immunotherapy Following with oncology.           Dispo: f/u in 4 months  Signed, Olivia Pavy, [name]

## 2024-04-05 ENCOUNTER — Inpatient Hospital Stay

## 2024-04-05 ENCOUNTER — Encounter: Payer: Self-pay | Admitting: Hematology & Oncology

## 2024-04-05 ENCOUNTER — Inpatient Hospital Stay (HOSPITAL_BASED_OUTPATIENT_CLINIC_OR_DEPARTMENT_OTHER): Admitting: Hematology & Oncology

## 2024-04-05 ENCOUNTER — Inpatient Hospital Stay: Attending: Hematology & Oncology

## 2024-04-05 VITALS — BP 112/91 | HR 83 | Temp 98.3°F | Resp 18 | Ht 65.0 in | Wt 178.0 lb

## 2024-04-05 VITALS — BP 134/83 | HR 77 | Resp 18

## 2024-04-05 DIAGNOSIS — Z79620 Long term (current) use of immunosuppressive biologic: Secondary | ICD-10-CM | POA: Diagnosis not present

## 2024-04-05 DIAGNOSIS — Z87891 Personal history of nicotine dependence: Secondary | ICD-10-CM | POA: Diagnosis not present

## 2024-04-05 DIAGNOSIS — C3432 Malignant neoplasm of lower lobe, left bronchus or lung: Secondary | ICD-10-CM

## 2024-04-05 DIAGNOSIS — Z5112 Encounter for antineoplastic immunotherapy: Secondary | ICD-10-CM | POA: Insufficient documentation

## 2024-04-05 LAB — CMP (CANCER CENTER ONLY)
ALT: 45 U/L — ABNORMAL HIGH (ref 0–44)
AST: 25 U/L (ref 15–41)
Albumin: 4.3 g/dL (ref 3.5–5.0)
Alkaline Phosphatase: 71 U/L (ref 38–126)
Anion gap: 11 (ref 5–15)
BUN: 11 mg/dL (ref 6–20)
CO2: 27 mmol/L (ref 22–32)
Calcium: 9.2 mg/dL (ref 8.9–10.3)
Chloride: 101 mmol/L (ref 98–111)
Creatinine: 0.91 mg/dL (ref 0.61–1.24)
GFR, Estimated: >60 mL/min (ref >60)
Glucose, Bld: 101 mg/dL — ABNORMAL HIGH (ref 70–99)
Potassium: 4 mmol/L (ref 3.5–5.1)
Sodium: 140 mmol/L (ref 135–145)
Total Bilirubin: 0.4 mg/dL (ref 0.0–1.2)
Total Protein: 7.3 g/dL (ref 6.5–8.1)

## 2024-04-05 LAB — CBC WITH DIFFERENTIAL (CANCER CENTER ONLY)
Abs Immature Granulocytes: 0.03 K/uL (ref 0.00–0.07)
Basophils Absolute: 0.1 K/uL (ref 0.0–0.1)
Basophils Relative: 1 %
Eosinophils Absolute: 0.2 K/uL (ref 0.0–0.5)
Eosinophils Relative: 2 %
HCT: 45.1 % (ref 39.0–52.0)
Hemoglobin: 15.4 g/dL (ref 13.0–17.0)
Immature Granulocytes: 0 %
Lymphocytes Relative: 9 %
Lymphs Abs: 0.8 K/uL (ref 0.7–4.0)
MCH: 29.6 pg (ref 26.0–34.0)
MCHC: 34.1 g/dL (ref 30.0–36.0)
MCV: 86.6 fL (ref 80.0–100.0)
Monocytes Absolute: 0.8 K/uL (ref 0.1–1.0)
Monocytes Relative: 9 %
Neutro Abs: 6.9 K/uL (ref 1.7–7.7)
Neutrophils Relative %: 79 %
Platelet Count: 248 K/uL (ref 150–400)
RBC: 5.21 MIL/uL (ref 4.22–5.81)
RDW: 13.6 % (ref 11.5–15.5)
WBC Count: 8.8 K/uL (ref 4.0–10.5)
nRBC: 0 % (ref 0.0–0.2)

## 2024-04-05 LAB — TSH: TSH: 2.19 u[IU]/mL (ref 0.350–4.500)

## 2024-04-05 MED ORDER — SODIUM CHLORIDE 0.9 % IV SOLN
1500.0000 mg | Freq: Once | INTRAVENOUS | Status: AC
  Administered 2024-04-05: 1500 mg via INTRAVENOUS
  Filled 2024-04-05: qty 30

## 2024-04-05 MED ORDER — SODIUM CHLORIDE 0.9 % IV SOLN: Freq: Once | INTRAVENOUS | Status: AC

## 2024-04-05 NOTE — Patient Instructions (Signed)
 [facility] - A DEPT OF [facility]  Discharge Instructions: Thank you for choosing [facility] to provide your oncology and hematology care.   If you have a lab appointment with the Cancer Center, please go directly to the Cancer Center and check in at the registration area.  Wear comfortable clothing and clothing appropriate for easy access to any Portacath or PICC line.   We strive to give you quality time with your provider. You may need to reschedule your appointment if you arrive late (15 or more minutes).  Arriving late affects you and other patients whose appointments are after yours.  Also, if you miss three or more appointments without notifying the office, you may be dismissed from the clinic at the provider's discretion.      For prescription refill requests, have your pharmacy contact our office and allow 72 hours for refills to be completed.    Today you received the following chemotherapy and/or immunotherapy agents:  Imfinzi      To help prevent nausea and vomiting after your treatment, we encourage you to take your nausea medication as directed.  BELOW ARE SYMPTOMS THAT SHOULD BE REPORTED IMMEDIATELY: *FEVER GREATER THAN 100.4 F (38 C) OR HIGHER *CHILLS OR SWEATING *NAUSEA AND VOMITING THAT IS NOT CONTROLLED WITH YOUR NAUSEA MEDICATION *UNUSUAL SHORTNESS OF BREATH *UNUSUAL BRUISING OR BLEEDING *URINARY PROBLEMS (pain or burning when urinating, or frequent urination) *BOWEL PROBLEMS (unusual diarrhea, constipation, pain near the anus) TENDERNESS IN MOUTH AND THROAT WITH OR WITHOUT PRESENCE OF ULCERS (sore throat, sores in mouth, or a toothache) UNUSUAL RASH, SWELLING OR PAIN  UNUSUAL VAGINAL DISCHARGE OR ITCHING   Items with * indicate a potential emergency and should be followed up as soon as possible or go to the Emergency Department if any problems should occur.  Please show the CHEMOTHERAPY ALERT CARD or IMMUNOTHERAPY  ALERT CARD at check-in to the Emergency Department and triage nurse. Should you have questions after your visit or need to cancel or reschedule your appointment, please contact [facility] - A DEPT OF [facility]  [phone] and follow the prompts.  Office hours are 8:00 a.m. to 4:30 p.m. Monday - Friday. Please note that voicemails left after 4:00 p.m. may not be returned until the following business day.  We are closed weekends and major holidays. You have access to a nurse at all times for urgent questions. Please call the main number to the clinic [phone] and follow the prompts.  For any non-urgent questions, you may also contact your provider using MyChart. We now offer e-Visits for anyone 36 and older to request care online for non-urgent symptoms. For details visit [email or web address].   Also download the MyChart app! Go to the app store, search "MyChart", open the app, select [place], and log in with your MyChart username and password.

## 2024-04-05 NOTE — Progress Notes (Signed)
 [facility] Hematology and Oncology Follow Up Visit  Brandon Phillips Brandon Phillips [id number] 07-16-1966 58 y.o. 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 twice daily-start on 12/06/2022 -changed to 2.5 mg p.o. twice daily on 05/12/2023 Status post radiosurgery-completed on 02/11/2023 Durvalumab  1500 mg IV monthly-s/p cycle #11-- start on 02/17/2023     Interim History:  Brandon Phillips is back for follow-up.  He really looks fantastic.  He really has no complaints.  He and his wife had a nice weekend.  Everything was pretty quiet.  He is still working.  His heart is being followed closely by Cardiology.  He continues on Eliquis  for the pulmonary embolism.  He has now been about 15 months since he had this.  He continues on low-dose aspirin .  He has had no change in bowel or bladder habits.  He has had no diarrhea..  There is been no leg swelling.  He has had no weakness.  He has had no headache.  His last TSH back in August was 1.35.  Overall, I would have to set his performance status is probably ECOG 1.    Wt Readings from Last 3 Encounters:  04/05/24 178 lb (80.7 kg)  02/20/24 181 lb 1.3 oz (82.1 kg)  01/23/24 182 lb 4.8 oz (82.7 kg)     Medications:  Current Outpatient Medications:    albuterol  (PROVENTIL ) (2.5 MG/3ML) 0.083% nebulizer solution, Take 3 mLs (2.5 mg total) by nebulization every 6 (six) hours as needed for wheezing or shortness of breath., Disp: 75 mL, Rfl: 5   albuterol  (VENTOLIN  HFA) 108 (90 Base) MCG/ACT inhaler, Inhale 2 puffs into the lungs every 6 (six) hours as needed for wheezing., Disp: 2 each, Rfl: 11   amLODipine  (NORVASC ) 5 MG tablet, Take 1 tablet (5 mg total) by mouth daily., Disp: 90 tablet, Rfl: 3   apixaban  (ELIQUIS ) 2.5 MG TABS  tablet, Take 1 tablet (2.5 mg total) by mouth 2 (two) times daily., Disp: 180 tablet, Rfl: 3   aspirin  EC 81 MG tablet, Take 1 tablet (81 mg total) by mouth daily. Swallow whole., Disp: 90 tablet, Rfl: 3   atorvastatin  (LIPITOR) 80 MG tablet, Take 1 tablet (80 mg total) by mouth daily., Disp: 90 tablet, Rfl: 3   budesonide -glycopyrrolate-formoterol  (BREZTRI  AEROSPHERE) 160-9-4.8 MCG/ACT AERO inhaler, Inhale 2 puffs into the lungs in the morning and at bedtime., Disp: 1 each, Rfl: 11   COENZYME Q10 PO, Take 100 mg by mouth in the morning., Disp: , Rfl:    gabapentin  (NEURONTIN ) 300 MG capsule, TAKE 1 CAPSULE BY MOUTH 3 TIMES A DAY, Disp: 90 capsule, Rfl: 3   GUAIFENESIN 1200 PO, Take 1,200 mg by mouth in the morning and at bedtime., Disp: , Rfl:    isosorbide  mononitrate (IMDUR ) 30 MG 24 hr tablet, Take 1 tablet (30 mg total) by mouth 2 (two) times daily., Disp: 90 tablet, Rfl: 3   Multiple Vitamin (MULTIVITAMIN) capsule, Take 1 capsule by mouth at bedtime., Disp: , Rfl:    triamcinolone  cream (KENALOG ) 0.1 %, Apply 1 Application topically 2 (two) times daily., Disp: 45 g, Rfl: 3   folic acid  (FOLVITE ) 1 MG tablet, TAKE 1 TABLET BY MOUTH DAILY. START 7 DAYS BEFORE PEMETREXED  CHEMOTHERAPY, CONTINUE UNTIL 21 DAYS AFTER PEMETREXED  COMPLETED (Patient not  taking: Reported on 04/05/2024), Disp: 30 tablet, Rfl: 8   lidocaine -prilocaine  (EMLA ) cream, Apply a dime size to port-a-cath 1-2 hours prior to access. Cover with saran wrap. (Patient not taking: Reported on 04/05/2024), Disp: 30 g, Rfl: 2  Allergies:  Allergies  Allergen Reactions   Imdur  [Isosorbide  Nitrate] Other (See Comments)    Headache    Past Medical History, Surgical history, Social history, and Family History were reviewed and updated.  Review of Systems: Review of Systems  Constitutional: Negative.   HENT:  Negative.    Eyes: Negative.   Respiratory:  Positive for cough.   Cardiovascular: Negative.   Gastrointestinal: Negative.    Endocrine: Negative.   Genitourinary: Negative.    Musculoskeletal: Negative.   Skin: Negative.   Neurological: Negative.   Hematological: Negative.   Psychiatric/Behavioral: Negative.      Physical Exam: His vital signs are temperature 98.3.  Pulse 83.  Blood pressure 112/92  weight is 178 pounds    Wt Readings from Last 3 Encounters:  04/05/24 178 lb (80.7 kg)  02/20/24 181 lb 1.3 oz (82.1 kg)  01/23/24 182 lb 4.8 oz (82.7 kg)    Physical Exam Vitals reviewed.  HENT:     Head: Normocephalic and atraumatic.  Eyes:     Pupils: Pupils are equal, round, and reactive to light.  Cardiovascular:     Rate and Rhythm: Normal rate and regular rhythm.     Heart sounds: Normal heart sounds.  Pulmonary:     Effort: Pulmonary effort is normal.     Breath sounds: Normal breath sounds.  Abdominal:     General: Bowel sounds are normal.     Palpations: Abdomen is soft.  Musculoskeletal:        General: No tenderness or deformity. Normal range of motion.     Cervical back: Normal range of motion.  Lymphadenopathy:     Cervical: No cervical adenopathy.  Skin:    General: Skin is warm and dry.     Findings: No erythema or rash.  Neurological:     Mental Status: He is alert and oriented to person, place, and time.  Psychiatric:        Behavior: Behavior normal.        Thought Content: Thought content normal.        Judgment: Judgment normal.     Lab Results  Component Value Date   WBC 8.8 04/05/2024   HGB 15.4 04/05/2024   HCT 45.1 04/05/2024   MCV 86.6 04/05/2024   PLT 248 04/05/2024     Chemistry      Component Value Date/Time   NA 140 04/05/2024 0948   NA 139 12/09/2023 0830   K 4.0 04/05/2024 0948   CL 101 04/05/2024 0948   CO2 27 04/05/2024 0948   BUN 11 04/05/2024 0948   BUN 12 12/09/2023 0830   CREATININE 0.91 04/05/2024 0948   CREATININE 1.05 01/11/2022 0000      Component Value Date/Time   CALCIUM  9.2 04/05/2024 0948   ALKPHOS 71 04/05/2024 0948   AST  25 04/05/2024 0948   ALT 45 (H) 04/05/2024 0948   BILITOT 0.4 04/05/2024 0948       Impression and Plan: Brandon Phillips is a very nice 58 year old white male.  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  Again, he does not wish to have surgery.  He has seen Dr. Kerrin of thoracic surgery.  Again, we have him on maintenance therapy with Durvalumab .  He has had 1 year of Durvalumab .  He would like to continue the Durvalumab .  We could certainly do this.  I think probably will be reasonable is to spread his intervals out a little bit.  I probably would treat him every 6 weeks for right now.  I would then do another PET scan in about 3 months.  If that PET scan looks okay, then we might move his appointments out to every 2 months.  I will now plan to get him back in about 6 weeks.   Maude JONELLE Crease, MD 9/8/202511:00 AM

## 2024-04-06 LAB — T4: T4, Total: 6.1 ug/dL (ref 4.5–12.0)

## 2024-04-13 ENCOUNTER — Ambulatory Visit: Admitting: Cardiovascular Disease

## 2024-04-13 ENCOUNTER — Ambulatory Visit: Attending: Physician Assistant | Admitting: Physician Assistant

## 2024-04-13 ENCOUNTER — Ambulatory Visit (INDEPENDENT_AMBULATORY_CARE_PROVIDER_SITE_OTHER)

## 2024-04-13 ENCOUNTER — Telehealth: Payer: Self-pay

## 2024-04-13 ENCOUNTER — Encounter: Payer: Self-pay | Admitting: Nurse Practitioner

## 2024-04-13 ENCOUNTER — Ambulatory Visit: Admitting: Nurse Practitioner

## 2024-04-13 ENCOUNTER — Encounter: Payer: Self-pay | Admitting: Physician Assistant

## 2024-04-13 VITALS — BP 124/78 | HR 83 | Ht 65.5 in | Wt 178.6 lb

## 2024-04-13 VITALS — BP 118/74 | HR 80 | Temp 97.9°F | Ht 65.5 in | Wt 178.8 lb

## 2024-04-13 DIAGNOSIS — G4733 Obstructive sleep apnea (adult) (pediatric): Secondary | ICD-10-CM

## 2024-04-13 DIAGNOSIS — J441 Chronic obstructive pulmonary disease with (acute) exacerbation: Secondary | ICD-10-CM

## 2024-04-13 DIAGNOSIS — Z9981 Dependence on supplemental oxygen: Secondary | ICD-10-CM

## 2024-04-13 DIAGNOSIS — I25111 Atherosclerotic heart disease of native coronary artery with angina pectoris with documented spasm: Secondary | ICD-10-CM | POA: Diagnosis not present

## 2024-04-13 DIAGNOSIS — C3432 Malignant neoplasm of lower lobe, left bronchus or lung: Secondary | ICD-10-CM

## 2024-04-13 DIAGNOSIS — I4729 Other ventricular tachycardia: Secondary | ICD-10-CM

## 2024-04-13 DIAGNOSIS — J449 Chronic obstructive pulmonary disease, unspecified: Secondary | ICD-10-CM

## 2024-04-13 DIAGNOSIS — J984 Other disorders of lung: Secondary | ICD-10-CM | POA: Diagnosis not present

## 2024-04-13 DIAGNOSIS — Z86711 Personal history of pulmonary embolism: Secondary | ICD-10-CM

## 2024-04-13 DIAGNOSIS — E782 Mixed hyperlipidemia: Secondary | ICD-10-CM | POA: Diagnosis not present

## 2024-04-13 DIAGNOSIS — Z85118 Personal history of other malignant neoplasm of bronchus and lung: Secondary | ICD-10-CM | POA: Diagnosis not present

## 2024-04-13 DIAGNOSIS — I201 Angina pectoris with documented spasm: Secondary | ICD-10-CM

## 2024-04-13 DIAGNOSIS — J9611 Chronic respiratory failure with hypoxia: Secondary | ICD-10-CM | POA: Diagnosis not present

## 2024-04-13 DIAGNOSIS — R0989 Other specified symptoms and signs involving the circulatory and respiratory systems: Secondary | ICD-10-CM | POA: Insufficient documentation

## 2024-04-13 DIAGNOSIS — I2510 Atherosclerotic heart disease of native coronary artery without angina pectoris: Secondary | ICD-10-CM

## 2024-04-13 DIAGNOSIS — R490 Dysphonia: Secondary | ICD-10-CM

## 2024-04-13 MED ORDER — AZITHROMYCIN 250 MG PO TABS: ORAL_TABLET | ORAL | 0 refills | Status: DC

## 2024-04-13 MED ORDER — ATORVASTATIN CALCIUM 80 MG PO TABS: 80.0000 mg | ORAL_TABLET | Freq: Every day | ORAL | 3 refills | Status: AC

## 2024-04-13 MED ORDER — PREDNISONE 20 MG PO TABS: 40.0000 mg | ORAL_TABLET | Freq: Every day | ORAL | 0 refills | Status: AC

## 2024-04-13 MED ORDER — SPIRIVA RESPIMAT 2.5 MCG/ACT IN AERS: 2.0000 | INHALATION_SPRAY | Freq: Every day | RESPIRATORY_TRACT | 11 refills | Status: DC

## 2024-04-13 MED ORDER — BUDESONIDE-FORMOTEROL FUMARATE 80-4.5 MCG/ACT IN AERO: 2.0000 | INHALATION_SPRAY | Freq: Two times a day (BID) | RESPIRATORY_TRACT | 11 refills | Status: DC

## 2024-04-13 MED ORDER — ISOSORBIDE MONONITRATE ER 30 MG PO TB24: 30.0000 mg | ORAL_TABLET | Freq: Two times a day (BID) | ORAL | 3 refills | Status: DC

## 2024-04-13 MED ORDER — AMLODIPINE BESYLATE 5 MG PO TABS: 5.0000 mg | ORAL_TABLET | Freq: Every day | ORAL | 3 refills | Status: AC

## 2024-04-13 NOTE — Telephone Encounter (Signed)
 Do we have an update on this pt's Ohtuvayre ? Everything was sent in February and last documented follow up was from March. Thanks.

## 2024-04-13 NOTE — Assessment & Plan Note (Signed)
 With associated voice hoarseness. Possibly related to ICS use but seems to be worsening. See above. Will refer to ENT for direct laryngoscopy to rule out VCD and ensure no pathologic cause as he is higher risk with smoking hx.

## 2024-04-13 NOTE — Patient Instructions (Addendum)
 Continue Albuterol  inhaler 2 puffs or 3 mL neb every 6 hours as needed for shortness of breath or wheezing. Use nebs 2-4 times a day until symptoms improve. Follow with flutter valve Continue guaifenesin 1200 mg Twice daily for chest congestion/cough Continue Flonase  nasal spray 2 sprays each nostril daily for nasal congestion/drainage  Continue Eliquis  Twice daily  Continue supplemental oxygen  3 lpm on POC with activity and 2 lpm continuous at night for goal >88-90% until you start CPAP.    Switch Breztri  to lower dose Symbicort  2 puffs Twice daily. Brush tongue and rinse mouth afterwards  And Spiriva  2 puffs daily If your breathing gets worse with the change, let me know   I will follow up on the Ohtuvayre  prescription   Prednisone  40 mg daily for 5 days. Take in AM with food Azithromycin  - take 2 tabs on day one then 1 tab daily for four additional days   Referral to Ear, Nose and Throat for throat clearing and voice hoarseness   Start CPAP 5-15 cmH2O, nasal mask of choice every night, minimum of 4-6 hours a night.  Change equipment as directed. Wash your tubing with warm soap and water daily, hang to dry. Wash humidifier portion weekly. Use bottled, distilled water and change daily Be aware of reduced alertness and do not drive or operate heavy machinery if experiencing this or drowsiness.  Exercise encouraged, as tolerated. Healthy weight management discussed.  Avoid or decrease alcohol consumption and medications that make you more sleepy, if possible. Notify if persistent daytime sleepiness occurs even with consistent use of PAP therapy.  Change CPAP supplies... Every month Mask cushions and/or nasal pillows CPAP machine filters Every 3 months Mask frame (not including the headgear) CPAP tubing Every 6 months Mask headgear Chin strap (if applicable) Humidifier water tub  Once you're on CPAP, send me a message and I will order an overnight oxygen  study   Follow up with  oncology as scheduled    Follow up in 6-8 weeks with Katie Mavi Un,NP to follow up on CPAP. Follow up with Dr. Geronimo in 4 months. If symptoms do not improve or worsen, please contact office for sooner follow up or seek emergency care.

## 2024-04-13 NOTE — Assessment & Plan Note (Signed)
 Stable without increased O2 requirement. On room air during OV today without desaturations. Goal >88-90%. Will obtain ONO on CPAP once he establishes therapy, and determine continued necessity of O2 at night.

## 2024-04-13 NOTE — Telephone Encounter (Signed)
 Copied from CRM [id number]. Topic: General - Other >> Apr 13, 2024 10:01 AM Rilla NOVAK wrote: Reason for CRM: Rexene from [facility] calling to verify which inhaler a patient is to pick up. Called CAL. Based on notes on today's visit, looks like a change. Please call [phone] to verify which inhaler the patient is to pick up.  Per lov from Izetta Rouleau, NP -   Switch Breztri  to lower dose Symbicort  2 puffs Twice daily. Brush tongue and rinse mouth afterwards  And Spiriva  2 puffs daily If your breathing gets worse with the change, let me know   I called the pharmacy and spoke to [place]. Pharmacist was informed of Katie's note and verbalized understanding. NFN

## 2024-04-13 NOTE — Assessment & Plan Note (Signed)
 Continues on immunotherapy. Tolerating well thus far. Stable PET scan July 2025. Follow up with Dr. Timmy as scheduled.

## 2024-04-13 NOTE — Assessment & Plan Note (Signed)
 Moderate OSA. We discussed how untreated sleep apnea puts an individual at risk for cardiac arrhthymias, pulm HTN, DM, stroke and increases their risk for daytime accidents. We also briefly reviewed treatment options including weight loss, side sleeping position, oral appliance, CPAP therapy or referral to ENT for possible surgical options. Shared decision to start CPAP therapy. Order placed for new start CPAP 5-15 cmH2O, nasal mask of choice and heated humidity. Reviewed proper care/use of device. Safe driving practices reviewed. Healthy weight management encouraged.

## 2024-04-13 NOTE — Patient Instructions (Signed)
 Medication Instructions:  NO CHANGES  Lab Work: NONE TO BE DONE TODAY.  Testing/Procedures: NONE  Follow-Up: At [facility], you and your health needs are our priority.  As part of our continuing mission to provide you with exceptional heart care, our providers are all part of one team.  This team includes your primary Cardiologist (physician) and Advanced Practice Providers or APPs (Physician Assistants and Nurse Practitioners) who all work together to provide you with the care you need, when you need it.  Your next appointment:   4 MONTHS  Provider:   Jerel Balding, MD

## 2024-04-13 NOTE — Progress Notes (Signed)
 @Patient  ID: Brandon Phillips., male    DOB: 04/06/1966, 58 y.o.   MRN: [id number]  Chief Complaint  Patient presents with   COPD    COPD may be flaring now, throat clearing worsening.  Review HST     Referring provider: Willo Mini, NP  HPI: 58 year old male, former smoker followed for NSCLC of left lung and COPD. He is a patient of Dr. Reeves and last seen in office for sleep consult 11/17/2023 by [facility] NP. Past medical history significant for HTN, allergic rhinitis.   TEST/EVENTS:  07/25/2022 LDCT chest: Atherosclerosis.  Spiculated solid central left lower lobe lung mass measuring 35 mm, new.  Solid peripheral right upper lobe pulmonary nodule measuring 7.9 mm. 08/14/2022 PET scan: Low-level hypermetabolism of the right upper lobe pulmonary nodule.  Hypermetabolism of the left lower lobe lung mass and ipsilateral hilar nodal metastasis. 08/26/2022 PFT: FVC 66, FEV1 42, ratio 48, TLC 122, DLCOcor 57.  No BD. 11/19/2022 CXR: stable left lower lobe mass. New right-sided chest wall port.  12/04/2022 CTA chest: small subsegmental RLL PE. LLL perihilar mass, 2.2x1.8 cm. No LAD. No acute consolidation.  01/01/2023 PET: LLL nodule with intense hypermetabolism despite decrease in size. Hypermetabolic left hilar node. Intense metabolic activity in right shoulder AC joint, no CT lesion evident and favors non-neoplastic activity. No distant metastatic disease. Post radiation change in marrow of lower thoracic spine.  02/13/2023 CXR: unchanged left perihilar mass. No acute process 02/13/2024 PET scan: chronic right maxillary sinusitis. Mild hypermetabolism nodularity LLL, resoled. Left perihilar scarring/density with reduced SUV. LLL activity hypermetabolism decreased. Emphysema. Air bronchograms. Subtle nodularity along lingular bronchus without metabolism. Small fiducial activity in RUL. Right atrium. Atherosclerosis.  12/03/2023 HST: AHI 16/h, SpO2 low 70% >> Moderate OSA  09/24/2023: OV with Dr.  Geronimo. Head cold around 3 weeks ago with yellow sputum. Feels he has recovered but still with increased cough. Still with sputum. Wife reports whistling noise at night. Immunotherapy continues. ONO completed 09/20/2022. </88% for 1 min and 5 seconds on 2 lpm. Has not seen sleep specialist - referral placed. Treated for mild AECOPD with doxycycline  and prednisone . Will prescribe Ohtuvayre . Prevnar vaccine given.   11/17/2023: Ov with Dr. Neysa for OSA consult. ONO with mild desats on 2 lpm O2. HST ordered.   04/13/2024: Today - follow up Discussed the use of AI scribe software for clinical note transcription with the patient, who gave verbal consent to proceed.  History of Present Illness Brandon Phillips. Brandon Phillips is a 58 year old male with COPD who presents with worsening hoarseness and increased cough with sputum.   He has experienced hoarseness for the past year, which has progressively worsened. The hoarseness began after starting Breztri , and he has been inconsistently gargling after using the inhaler, despite using a spacer. He has chronic throat clearing, which he also feels has progressed. No difficulties swallowing or sore throat.   He has been coughing up white sputum, which has become more frequent and thicker over the past two weeks. No postnasal drainage or sinus issues. There has been no known sick exposures. He denies fevers, chills, hemoptysis. Eating and drinking normally.   His oxygen  levels have been stable. He has been experiencing more congestion and cough. He has not been using his rescue inhaler as often as he should and has not started the nebulized Ohtuvayre  prescribed in February due to insurance issues.  He has moderate sleep apnea, with an average of sixteen events per  hour, and uses oxygen  at night. He reports feeling fine upon waking but is unsure about fatigue later in the day. He does snore. No weight loss or hemoptysis.    Allergies  Allergen Reactions    Imdur  [Isosorbide  Nitrate] Other (See Comments)    Headache    Immunization History  Administered Date(s) Administered   PFIZER(Purple Top)SARS-COV-2 Vaccination 08/03/2020, 08/25/2020   PNEUMOCOCCAL CONJUGATE-20 09/24/2023   Pneumococcal Polysaccharide-23 01/15/2018   Tdap 01/15/2018   Zoster Recombinant(Shingrix) 07/30/2019, 11/27/2019    Past Medical History:  Diagnosis Date   Cancer (HCC)    lung cancer   CAP (community acquired pneumonia)    COPD (chronic obstructive pulmonary disease) (HCC)    Eczema    HLD (hyperlipidemia)    Primary cancer of left lower lobe of lung (HCC) 09/17/2022   Tobacco use disorder 01/24/2017    Tobacco History: Social History   Tobacco Use  Smoking Status Former   Current packs/day: 0.00   Average packs/day: 1.5 packs/day for 35.0 years (52.5 ttl pk-yrs)   Types: Cigarettes   Start date: 03/30/1982   Quit date: 03/30/2017   Years since quitting: 7.0  Smokeless Tobacco Never  Tobacco Comments   Quit smoking cigarettes in 2018.  Quit vaping in 07/2021   Counseling given: Not Answered Tobacco comments: Quit smoking cigarettes in 2018.  Quit vaping in 07/2021   Outpatient Medications Prior to Visit  Medication Sig Dispense Refill   albuterol  (PROVENTIL ) (2.5 MG/3ML) 0.083% nebulizer solution Take 3 mLs (2.5 mg total) by nebulization every 6 (six) hours as needed for wheezing or shortness of breath. 75 mL 5   albuterol  (VENTOLIN  HFA) 108 (90 Base) MCG/ACT inhaler Inhale 2 puffs into the lungs every 6 (six) hours as needed for wheezing. 2 each 11   amLODipine  (NORVASC ) 5 MG tablet Take 1 tablet (5 mg total) by mouth daily. 90 tablet 3   apixaban  (ELIQUIS ) 2.5 MG TABS tablet Take 1 tablet (2.5 mg total) by mouth 2 (two) times daily. 180 tablet 3   aspirin  EC 81 MG tablet Take 1 tablet (81 mg total) by mouth daily. Swallow whole. 90 tablet 3   atorvastatin  (LIPITOR) 80 MG tablet Take 1 tablet (80 mg total) by mouth daily. 90 tablet 3   COENZYME  Q10 PO Take 100 mg by mouth in the morning.     folic acid  (FOLVITE ) 1 MG tablet TAKE 1 TABLET BY MOUTH DAILY. START 7 DAYS BEFORE PEMETREXED  CHEMOTHERAPY, CONTINUE UNTIL 21 DAYS AFTER PEMETREXED  COMPLETED 30 tablet 8   gabapentin  (NEURONTIN ) 300 MG capsule TAKE 1 CAPSULE BY MOUTH 3 TIMES A DAY 90 capsule 3   GUAIFENESIN 1200 PO Take 1,200 mg by mouth in the morning and at bedtime.     isosorbide  mononitrate (IMDUR ) 30 MG 24 hr tablet Take 1 tablet (30 mg total) by mouth 2 (two) times daily. 90 tablet 3   Multiple Vitamin (MULTIVITAMIN) capsule Take 1 capsule by mouth at bedtime.     triamcinolone  cream (KENALOG ) 0.1 % Apply 1 Application topically 2 (two) times daily. 45 g 3   budesonide -glycopyrrolate-formoterol  (BREZTRI  AEROSPHERE) 160-9-4.8 MCG/ACT AERO inhaler Inhale 2 puffs into the lungs in the morning and at bedtime. 1 each 11   lidocaine -prilocaine  (EMLA ) cream Apply a dime size to port-a-cath 1-2 hours prior to access. Cover with saran wrap. (Patient not taking: Reported on 04/13/2024) 30 g 2   No facility-administered medications prior to visit.     Review of Systems: as above  Physical Exam:  BP 118/74   Pulse 80   Temp 97.9 F (36.6 C)   Ht 5' 5.5 (1.664 m)   Wt 178 lb 12.8 oz (81.1 kg)   SpO2 95%   BMI 29.30 kg/m   GEN: Pleasant, interactive, well-kempt; in no acute distress. HEENT:  Normocephalic and atraumatic. PERRLA. Sclera white. Nasal turbinates pink, moist and patent bilaterally. No rhinorrhea present. Oropharynx pink and moist, without exudate or edema. No lesions, ulcerations, or postnasal drip.  NECK:  Supple w/ fair ROM. No JVD present. Normal carotid impulses w/o bruits. Thyroid  symmetrical with no goiter or nodules palpated. No lymphadenopathy.   CV: RRR, no m/r/g, no peripheral edema. Pulses intact, +2 bilaterally. No cyanosis, pallor or clubbing. PULMONARY:  Unlabored, regular breathing. Scattered wheezes bilaterally A&P L>R. No accessory muscle use.   GI: BS present and normoactive. Soft, non-tender to palpation.  MSK: No erythema, warmth or tenderness. Cap refil <2 sec all extrem.  Neuro: A/Ox3. No focal deficits noted.   Skin: Warm, no lesions or rashe Psych: Normal affect and behavior. Judgement and thought content appropriate.     Lab Results:  CBC    Component Value Date/Time   WBC 8.8 04/05/2024 0948   WBC 7.1 10/14/2023 0900   RBC 5.21 04/05/2024 0948   HGB 15.4 04/05/2024 0948   HGB 14.9 10/16/2023 1206   HCT 45.1 04/05/2024 0948   HCT 43.6 10/16/2023 1206   PLT 248 04/05/2024 0948   PLT 281 10/16/2023 1206   MCV 86.6 04/05/2024 0948   MCV 88 10/16/2023 1206   MCH 29.6 04/05/2024 0948   MCHC 34.1 04/05/2024 0948   RDW 13.6 04/05/2024 0948   RDW 13.9 10/16/2023 1206   LYMPHSABS 0.8 04/05/2024 0948   LYMPHSABS 0.7 04/16/2023 1109   MONOABS 0.8 04/05/2024 0948   EOSABS 0.2 04/05/2024 0948   EOSABS 0.2 04/16/2023 1109   BASOSABS 0.1 04/05/2024 0948   BASOSABS 0.1 04/16/2023 1109    BMET    Component Value Date/Time   NA 140 04/05/2024 0948   NA 139 12/09/2023 0830   K 4.0 04/05/2024 0948   CL 101 04/05/2024 0948   CO2 27 04/05/2024 0948   GLUCOSE 101 (H) 04/05/2024 0948   BUN 11 04/05/2024 0948   BUN 12 12/09/2023 0830   CREATININE 0.91 04/05/2024 0948   CREATININE 1.05 01/11/2022 0000   CALCIUM  9.2 04/05/2024 0948   GFRNONAA >60 04/05/2024 0948   GFRNONAA 88 04/06/2020 0708   GFRAA 102 04/06/2020 0708    BNP    Component Value Date/Time   BNP 87.1 02/17/2023 0930     Imaging:  DG Chest 2 View Result Date: 04/13/2024 CLINICAL DATA:  Acute exacerbation COPD.  History of lung carcinoma. EXAM: CHEST - 2 VIEW COMPARISON:  Chest x-ray 10/10/2023, PET scan 02/13/2024 FINDINGS: Stable appearance and positioning of Port-A-Cath. Normal heart size. Stable mediastinal contours. Stable bilateral perihilar and lingular scarring. Stable appearance fiducial marker in the right upper lobe. There is no  evidence of pulmonary edema, consolidation, pneumothorax or pleural fluid. No significant hyperinflation. Visualized bony structures are unremarkable. IMPRESSION: No acute findings. Stable bilateral perihilar and lingular scarring. Stable appearance of fiducial marker in the right upper lobe. Electronically Signed   By: Marcey Moan M.D.   On: 04/13/2024 10:22    durvalumab  (IMFINZI ) 1,500 mg in sodium chloride  0.9 % 100 mL chemo infusion     Date Action Dose Route User   02/20/2024 1251 Rate/Dose Change (none) Intravenous Dorlene,  Ronal SQUIBB, RN   02/20/2024 1150 Rate/Dose Change (none) Intravenous Dorlene Ronal SQUIBB, RN   02/20/2024 1149 New Bag/Given 1,500 mg Intravenous Kemp Chiquita BRAVO, RN      durvalumab  (IMFINZI ) 1,500 mg in sodium chloride  0.9 % 100 mL chemo infusion     Date Action Dose Route User   04/05/2024 1254 Infusion Verify (none) Intravenous Jewel Shanda SAUNDERS, RN   04/05/2024 1243 Rate/Dose Change (none) Intravenous Jewel Shanda SAUNDERS, RN   04/05/2024 1242 Rate/Dose Change (none) Intravenous Jewel Shanda SAUNDERS, RN   04/05/2024 1142 New Bag/Given 1,500 mg Intravenous Claudene France, RN      heparin  lock flush 100 unit/mL     Date Action Dose Route User   02/20/2024 1306 Given 500 Units Intracatheter Dorlene Ronal SQUIBB, RN      0.9 %  sodium chloride  infusion     Date Action Dose Route User   02/20/2024 1257 Rate/Dose Change (none) Intravenous Dorlene Ronal SQUIBB, RN   02/20/2024 1252 Rate/Dose Change (none) Intravenous Dorlene Ronal SQUIBB, RN   02/20/2024 1137 New Bag/Given (none) Intravenous Kemp Chiquita BRAVO, RN      0.9 %  sodium chloride  infusion     Date Action Dose Route User   04/05/2024 1257 Rate/Dose Change (none) Intravenous Jewel Shanda SAUNDERS, RN   04/05/2024 1143 Rate/Dose Change (none) Intravenous Jewel Shanda SAUNDERS, RN   04/05/2024 1111 New Bag/Given (none) Intravenous Claudene France, RN      sodium chloride  flush (NS) 0.9 % injection 10 mL     Date Action Dose Route User   02/20/2024  1305 Given 10 mL Intracatheter Dorlene Ronal SQUIBB, RN          Latest Ref Rng & Units 08/26/2022    3:25 PM  PFT Results  FVC-Pre L 2.75   FVC-Predicted Pre % 66   FVC-Post L 2.77   FVC-Predicted Post % 67   Pre FEV1/FVC % % 48   Post FEV1/FCV % % 48   FEV1-Pre L 1.33   FEV1-Predicted Pre % 42   FEV1-Post L 1.33   DLCO uncorrected ml/min/mmHg 15.07   DLCO UNC% % 61   DLCO corrected ml/min/mmHg 14.04   DLCO COR %Predicted % 57   DLVA Predicted % 60   TLC L 7.30   TLC % Predicted % 122   RV % Predicted % 235     No results found for: NITRICOXIDE      Assessment & Plan:   COPD with acute exacerbation (HCC) Severe COPD with acute mild exacerbation. Will treat with azithromycin  and prednisone  burst. CXR today to rule out superimposed infection. Progressive voice hoarseness; possibly related to ICS use. Will de-escalate ICS and change to Symbicort  80 mcg 2 puffs Twice daily and Spiriva  2 puffs daily. Reassess response. Continue Symbicort  with spacer; oral hygiene reviewed. Will follow up with pharmacy team on Ohtuvayre  rx. Action plan in place.  Patient Instructions  Continue Albuterol  inhaler 2 puffs or 3 mL neb every 6 hours as needed for shortness of breath or wheezing. Use nebs 2-4 times a day until symptoms improve. Follow with flutter valve Continue guaifenesin 1200 mg Twice daily for chest congestion/cough Continue Flonase  nasal spray 2 sprays each nostril daily for nasal congestion/drainage  Continue Eliquis  Twice daily  Continue supplemental oxygen  3 lpm on POC with activity and 2 lpm continuous at night for goal >88-90% until you start CPAP.    Switch Breztri  to lower dose Symbicort  2 puffs Twice daily. Brush tongue and rinse  mouth afterwards  And Spiriva  2 puffs daily If your breathing gets worse with the change, let me know   I will follow up on the Ohtuvayre  prescription   Prednisone  40 mg daily for 5 days. Take in AM with food Azithromycin  - take 2 tabs on  day one then 1 tab daily for four additional days   Referral to Ear, Nose and Throat for throat clearing and voice hoarseness   Start CPAP 5-15 cmH2O, nasal mask of choice every night, minimum of 4-6 hours a night.  Change equipment as directed. Wash your tubing with warm soap and water daily, hang to dry. Wash humidifier portion weekly. Use bottled, distilled water and change daily Be aware of reduced alertness and do not drive or operate heavy machinery if experiencing this or drowsiness.  Exercise encouraged, as tolerated. Healthy weight management discussed.  Avoid or decrease alcohol consumption and medications that make you more sleepy, if possible. Notify if persistent daytime sleepiness occurs even with consistent use of PAP therapy.  Change CPAP supplies... Every month Mask cushions and/or nasal pillows CPAP machine filters Every 3 months Mask frame (not including the headgear) CPAP tubing Every 6 months Mask headgear Chin strap (if applicable) Humidifier water tub  Once you're on CPAP, send me a message and I will order an overnight oxygen  study   Follow up with oncology as scheduled    Follow up in 6-8 weeks with Katie Benuel Ly,NP to follow up on CPAP. Follow up with Dr. Geronimo in 4 months. If symptoms do not improve or worsen, please contact office for sooner follow up or seek emergency care.    Chronic respiratory failure with hypoxia (HCC) Stable without increased O2 requirement. On room air during OV today without desaturations. Goal >88-90%. Will obtain ONO on CPAP once he establishes therapy, and determine continued necessity of O2 at night.   Primary cancer of left lower lobe of lung (HCC) Continues on immunotherapy. Tolerating well thus far. Stable PET scan July 2025. Follow up with Dr. Timmy as scheduled.   Moderate obstructive sleep apnea Moderate OSA. We discussed how untreated sleep apnea puts an individual at risk for cardiac arrhthymias, pulm HTN, DM,  stroke and increases their risk for daytime accidents. We also briefly reviewed treatment options including weight loss, side sleeping position, oral appliance, CPAP therapy or referral to ENT for possible surgical options. Shared decision to start CPAP therapy. Order placed for new start CPAP 5-15 cmH2O, nasal mask of choice and heated humidity. Reviewed proper care/use of device. Safe driving practices reviewed. Healthy weight management encouraged.   Chronic throat clearing With associated voice hoarseness. Possibly related to ICS use but seems to be worsening. See above. Will refer to ENT for direct laryngoscopy to rule out VCD and ensure no pathologic cause as he is higher risk with smoking hx.      I spent 45 minutes of dedicated to the care of this patient on the date of this encounter to include pre-visit review of records, face-to-face time with the patient discussing conditions above, post visit ordering of testing, clinical documentation with the electronic health record, making appropriate referrals as documented, and communicating necessary findings to members of the patients care team.  Comer LULLA Rouleau, NP 04/13/2024  Pt aware and understands NP's role.

## 2024-04-13 NOTE — Telephone Encounter (Addendum)
 Previous pa had been denied back in March, was unable to find denial reason or denial letter but it was most likely due to it being indicated the patient hasn't tried/failed a SABA/SAMA.   Resbumitted urgent pa to [facility] for Ohutvayre. Will update once we receive a response.  Key: [id number]

## 2024-04-13 NOTE — Assessment & Plan Note (Signed)
 Severe COPD with acute mild exacerbation. Will treat with azithromycin  and prednisone  burst. CXR today to rule out superimposed infection. Progressive voice hoarseness; possibly related to ICS use. Will de-escalate ICS and change to Symbicort  80 mcg 2 puffs Twice daily and Spiriva  2 puffs daily. Reassess response. Continue Symbicort  with spacer; oral hygiene reviewed. Will follow up with pharmacy team on Ohtuvayre  rx. Action plan in place.  Patient Instructions  Continue Albuterol  inhaler 2 puffs or 3 mL neb every 6 hours as needed for shortness of breath or wheezing. Use nebs 2-4 times a day until symptoms improve. Follow with flutter valve Continue guaifenesin 1200 mg Twice daily for chest congestion/cough Continue Flonase  nasal spray 2 sprays each nostril daily for nasal congestion/drainage  Continue Eliquis  Twice daily  Continue supplemental oxygen  3 lpm on POC with activity and 2 lpm continuous at night for goal >88-90% until you start CPAP.    Switch Breztri  to lower dose Symbicort  2 puffs Twice daily. Brush tongue and rinse mouth afterwards  And Spiriva  2 puffs daily If your breathing gets worse with the change, let me know   I will follow up on the Ohtuvayre  prescription   Prednisone  40 mg daily for 5 days. Take in AM with food Azithromycin  - take 2 tabs on day one then 1 tab daily for four additional days   Referral to Ear, Nose and Throat for throat clearing and voice hoarseness   Start CPAP 5-15 cmH2O, nasal mask of choice every night, minimum of 4-6 hours a night.  Change equipment as directed. Wash your tubing with warm soap and water daily, hang to dry. Wash humidifier portion weekly. Use bottled, distilled water and change daily Be aware of reduced alertness and do not drive or operate heavy machinery if experiencing this or drowsiness.  Exercise encouraged, as tolerated. Healthy weight management discussed.  Avoid or decrease alcohol consumption and medications that make you  more sleepy, if possible. Notify if persistent daytime sleepiness occurs even with consistent use of PAP therapy.  Change CPAP supplies... Every month Mask cushions and/or nasal pillows CPAP machine filters Every 3 months Mask frame (not including the headgear) CPAP tubing Every 6 months Mask headgear Chin strap (if applicable) Humidifier water tub  Once you're on CPAP, send me a message and I will order an overnight oxygen  study   Follow up with oncology as scheduled    Follow up in 6-8 weeks with Katie Laureen Frederic,NP to follow up on CPAP. Follow up with Dr. Geronimo in 4 months. If symptoms do not improve or worsen, please contact office for sooner follow up or seek emergency care.

## 2024-04-14 NOTE — Telephone Encounter (Signed)
 Received notification from [facility] regarding a prior authorization for OHTUVAYRE . Authorization has been APPROVED from 04/13/24 to 04/13/25. Approval letter sent to scan center.  Authorization # [id number] Phone # [phone]  Will forward approval letter to [employer]

## 2024-04-15 NOTE — Telephone Encounter (Signed)
**Note De-identified  [place] Obfuscation** Please advise 

## 2024-04-15 NOTE — Telephone Encounter (Signed)
 Still should be doing the Symbicort  and the Spiriva  plus the Ohtuvayre . The symbicort  and Spiriva  are taking the place of his Breztri . Ohtuvayre  is an add on therapy

## 2024-04-19 DIAGNOSIS — E785 Hyperlipidemia, unspecified: Secondary | ICD-10-CM | POA: Diagnosis not present

## 2024-04-19 DIAGNOSIS — R490 Dysphonia: Secondary | ICD-10-CM | POA: Diagnosis not present

## 2024-04-26 DIAGNOSIS — R490 Dysphonia: Secondary | ICD-10-CM | POA: Diagnosis not present

## 2024-04-26 DIAGNOSIS — E785 Hyperlipidemia, unspecified: Secondary | ICD-10-CM | POA: Diagnosis not present

## 2024-05-06 ENCOUNTER — Ambulatory Visit (INDEPENDENT_AMBULATORY_CARE_PROVIDER_SITE_OTHER): Admitting: Otolaryngology

## 2024-05-06 ENCOUNTER — Encounter (INDEPENDENT_AMBULATORY_CARE_PROVIDER_SITE_OTHER): Payer: Self-pay | Admitting: Otolaryngology

## 2024-05-06 VITALS — BP 122/79 | HR 89 | Temp 98.0°F

## 2024-05-06 DIAGNOSIS — R490 Dysphonia: Secondary | ICD-10-CM | POA: Diagnosis not present

## 2024-05-06 DIAGNOSIS — J383 Other diseases of vocal cords: Secondary | ICD-10-CM

## 2024-05-06 DIAGNOSIS — G4733 Obstructive sleep apnea (adult) (pediatric): Secondary | ICD-10-CM

## 2024-05-06 DIAGNOSIS — R0989 Other specified symptoms and signs involving the circulatory and respiratory systems: Secondary | ICD-10-CM

## 2024-05-06 DIAGNOSIS — Z87891 Personal history of nicotine dependence: Secondary | ICD-10-CM

## 2024-05-06 NOTE — Progress Notes (Signed)
 ENT CONSULT:  Reason for Consult: chronic hoarseness    HPI: Discussed the use of AI scribe software for clinical note transcription with the patient, who gave verbal consent to proceed.  History of Present Illness Brandon Phillips. Brandon Phillips is a 58 year old male with hx of left lung cancer s/p chemo and XRT, COPD, and sleep apnea who presents with hoarseness and voice changes. He was referred by his pulmonary doctor for evaluation of his voice changes.  He has been experiencing hoarseness and voice changes since his diagnosis of lung cancer. The hoarseness worsened after switching to Breztri  for his COPD, but he has since been switched back to his previous inhalers. He notes laryngeal pain, especially after radiation treatment to the lower left side of his lung. No trouble with swallowing.  He has a history of lung cancer and is currently undergoing immunotherapy. He previously underwent chemotherapy and radiation therapy. His oxygen  levels were below 80, and he started using a CPAP machine last night for sleep apnea. He wears oxygen  every night.  He is currently taking Eliquis  2.5 mg twice a day for a blood clot in his lung. He has a history of smoking.  No current issues with breathing.  Records Reviewed:  Dorothyann Rouleau NP Pulm office visit 04/13/24 Brandon Phillips. Brandon Phillips is a 58 year old male with COPD who presents with worsening hoarseness and increased cough with sputum.    He has experienced hoarseness for the past year, which has progressively worsened. The hoarseness began after starting Breztri , and he has been inconsistently gargling after using the inhaler, despite using a spacer. He has chronic throat clearing, which he also feels has progressed. No difficulties swallowing or sore throat.    He has been coughing up white sputum, which has become more frequent and thicker over the past two weeks. No postnasal drainage or sinus issues. There has been no known sick  exposures. He denies fevers, chills, hemoptysis. Eating and drinking normally.    His oxygen  levels have been stable. He has been experiencing more congestion and cough. He has not been using his rescue inhaler as often as he should and has not started the nebulized Ohtuvayre  prescribed in February due to insurance issues.   He has moderate sleep apnea, with an average of sixteen events per hour, and uses oxygen  at night. He reports feeling fine upon waking but is unsure about fatigue later in the day. He does snore. No weight loss or hemoptysis.  Moderate obstructive sleep apnea Moderate OSA. We discussed how untreated sleep apnea puts an individual at risk for cardiac arrhthymias, pulm HTN, DM, stroke and increases their risk for daytime accidents. We also briefly reviewed treatment options including weight loss, side sleeping position, oral appliance, CPAP therapy or referral to ENT for possible surgical options. Shared decision to start CPAP therapy. Order placed for new start CPAP 5-15 cmH2O, nasal mask of choice and heated humidity. Reviewed proper care/use of device. Safe driving practices reviewed. Healthy weight management encouraged.    Chronic throat clearing With associated voice hoarseness. Possibly related to ICS use but seems to be worsening. See above. Will refer to ENT for direct laryngoscopy to rule out VCD and ensure no pathologic cause as he is higher risk with smoking hx.      Past Medical History:  Diagnosis Date   Cancer (HCC)    lung cancer   CAP (community acquired pneumonia)    COPD (chronic obstructive pulmonary disease) (HCC)  Eczema    HLD (hyperlipidemia)    Primary cancer of left lower lobe of lung (HCC) 09/17/2022   Tobacco use disorder 01/24/2017    Past Surgical History:  Procedure Laterality Date   BRONCHIAL BIOPSY  08/29/2022   Procedure: BRONCHIAL BIOPSIES;  Surgeon: Gladis Leonor HERO, MD;  Location: [facility] ENDOSCOPY;  Service: Pulmonary;;   BRONCHIAL  BRUSHINGS  08/29/2022   Procedure: BRONCHIAL BRUSHINGS;  Surgeon: Gladis Leonor HERO, MD;  Location: [facility] ENDOSCOPY;  Service: Pulmonary;;   BRONCHIAL NEEDLE ASPIRATION BIOPSY  08/29/2022   Procedure: BRONCHIAL NEEDLE ASPIRATION BIOPSIES;  Surgeon: Gladis Leonor HERO, MD;  Location: [facility] ENDOSCOPY;  Service: Pulmonary;;   BRONCHIAL WASHINGS  08/29/2022   Procedure: BRONCHIAL WASHINGS;  Surgeon: Gladis Leonor HERO, MD;  Location: [facility] ENDOSCOPY;  Service: Pulmonary;;   CORONARY PRESSURE/FFR WITH 3D MAPPING N/A 10/13/2023   Procedure: Coronary Pressure/FFR w/3D Mapping;  Surgeon: Ladona Heinz, MD;  Location: [facility] INVASIVE CV LAB;  Service: Cardiovascular;  Laterality: N/A;   FIDUCIAL MARKER PLACEMENT  08/29/2022   Procedure: FIDUCIAL MARKER PLACEMENT;  Surgeon: Gladis Leonor HERO, MD;  Location: [facility] ENDOSCOPY;  Service: Pulmonary;;   HERNIA REPAIR     as an infant   IR IMAGING GUIDED PORT INSERTION  10/03/2022   IR RADIOLOGIST EVAL & MGMT  10/21/2022   LEFT HEART CATH AND CORONARY ANGIOGRAPHY N/A 10/13/2023   Procedure: LEFT HEART CATH AND CORONARY ANGIOGRAPHY;  Surgeon: Ladona Heinz, MD;  Location: [facility] INVASIVE CV LAB;  Service: Cardiovascular;  Laterality: N/A;   VIDEO BRONCHOSCOPY WITH ENDOBRONCHIAL ULTRASOUND  08/29/2022   Procedure: VIDEO BRONCHOSCOPY WITH ENDOBRONCHIAL ULTRASOUND;  Surgeon: Gladis Leonor HERO, MD;  Location: [facility] ENDOSCOPY;  Service: Pulmonary;;    History reviewed. No pertinent family history.  Social History:  reports that he quit smoking about 7 years ago. His smoking use included cigarettes. He started smoking about 42 years ago. He has a 52.5 pack-year smoking history. He has never used smokeless tobacco. He reports that he does not drink alcohol and does not use drugs.  Allergies:  Allergies  Allergen Reactions   Imdur  [Isosorbide  Nitrate] Other (See Comments)    Headache    Medications: I have reviewed the patient's current medications.  The PMH, PSH, Medications, Allergies, and SH were  reviewed and updated.  ROS: Constitutional: Negative for fever, weight loss and weight gain. Cardiovascular: Negative for chest pain and dyspnea on exertion. Respiratory: Is not experiencing shortness of breath at rest. Gastrointestinal: Negative for nausea and vomiting. Neurological: Negative for headaches. Psychiatric: The patient is not nervous/anxious  Blood pressure 122/79, pulse 89, temperature 98 F (36.7 C), SpO2 90%. There is no height or weight on file to calculate BMI.  PHYSICAL EXAM:  Exam: General: Well-developed, well-nourished Communication and Voice: hoarse Respiratory Respiratory effort: Equal inspiration and expiration without stridor Cardiovascular Peripheral Vascular: Warm extremities with equal color/perfusion Eyes: No nystagmus with equal extraocular motion bilaterally Neuro/Psych/Balance: Patient oriented to person, place, and time; Appropriate mood and affect; Gait is intact with no imbalance; Cranial nerves I-XII are intact Head and Face Inspection: Normocephalic and atraumatic without mass or lesion Palpation: Facial skeleton intact without bony stepoffs Salivary Glands: No mass or tenderness Facial Strength: Facial motility symmetric and full bilaterally ENT Pinna: External ear intact and fully developed External canal: Canal is patent with intact skin Tympanic Membrane: Clear and mobile External Nose: No scar or anatomic deformity Internal Nose: Septum is straight. No polyp, or purulence. Mucosal edema and erythema present.  Bilateral inferior turbinate  hypertrophy.  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 Supraglottic: Normal appearing epiglottis and AE folds Interarytenoid Space: Moderate pachydermia&edema Subglottic Space: Patent without lesion or edema Neck Neck and Trachea: Midline trachea without mass or lesion Thyroid : No mass or nodularity Lymphatics: No lymphadenopathy  Studies Reviewed:  Preoperative diagnosis: hoarseness  Postoperative diagnosis:   same + L VF mass  Procedure: Flexible fiberoptic laryngoscopy with stroboscopy (68420)   Surgeon: Cieanna Stormes, MD  Anesthesia: Topical lidocaine  and Afrin  Complications: None  Condition is stable throughout exam  Indications and consent:   The patient presents to the clinic with hoarseness. All the risks, benefits, and potential complications were reviewed with the patient preoperatively and informed verbal consent was obtained.  Procedure: The patient was seated upright in the exam chair.   Topical lidocaine  and Afrin were applied to the nasal cavity. After adequate anesthesia had occurred, the flexible telescope with strobe capabilities was passed into the nasal cavity. The nasopharynx was patent without mass or lesion. The scope was passed behind the soft palate and directed toward the base of tongue. The base of tongue was visualized and was symmetric with no apparent masses or abnormal appearing tissue. There were no signs of a mass or pooling of secretions in the piriform sinuses. The supraglottic structures were normal.  The true vocal cords are mobile. The medial edges were straight but irregular on the left side due to exophytic mass. Closure was incomplete. Periodicity present. The mucosal wave and amplitude were absent on the left side and normal on the right. There is moderate interarytenoid pachydermia and post cricoid edema. The laryngoscope was then slowly withdrawn and the patient tolerated the procedure well. There were no complications or blood loss.  Sleep  study 12/23/23   Assessment/Plan: Encounter Diagnoses  Name Primary?   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 Likely early stage (stage 1) with poor lymphatic drainage of the vocal cords, reducing metastasis risk. - Coordinate with primary prescriber to hold Eliquis  for 3-5 days prior to surgery, if possible. If not, proceed with surgery while on Eliquis . - Schedule outpatient DML biopsy and excision of the left vocal cord mass at [facility] OR. - Send excised tissue to pathology for analysis. - Discussed potential need for further excision or radiation therapy based on surgical findings and pathology results. - Advised that post-operative recovery may involve worsened voice initially, with potential for significant improvement over 3 months. - Informed that the procedure is outpatient with no significant pain expected, and he can eat and drink post-operatively. - Plan for follow-up based on pathology results to determine further treatment, if necessary. - risks and benefits were discussed and he would like to proceed  Thank you  for allowing me to participate in the care of this patient. Please do not hesitate to contact me with any questions or concerns.   Elena Larry, MD Otolaryngology [facility] Phone: [phone] Fax: [phone]    05/06/2024, 3:43 PM

## 2024-05-07 ENCOUNTER — Ambulatory Visit: Admitting: Pharmacist

## 2024-05-07 ENCOUNTER — Telehealth: Payer: Self-pay | Admitting: Pharmacist

## 2024-05-07 ENCOUNTER — Encounter: Payer: Self-pay | Admitting: Pharmacist

## 2024-05-07 DIAGNOSIS — E782 Mixed hyperlipidemia: Secondary | ICD-10-CM | POA: Diagnosis not present

## 2024-05-07 DIAGNOSIS — I2510 Atherosclerotic heart disease of native coronary artery without angina pectoris: Secondary | ICD-10-CM

## 2024-05-07 NOTE — Patient Instructions (Addendum)
 It was nice meeting you two topday  We would like your LDL (bad cholesterol) to be less than 70 and we would like your triglycerides to be less than 150  Please continue your atorvastatin  80mg  once a day  The medications we discussed today are called Repatha and Vascepa  I will complete the prior authorizations for you and contact you next week when I hear a response  Once you start the medications we will recheck your fasting lipid panel in 3 months  Medford Bolk, PharmD, BCACP, CDCES, CPP [facility] [address], [place], [place] [address] Phone: [phone]; Fax: [phone] 05/07/2024 3:18 PM

## 2024-05-07 NOTE — Progress Notes (Signed)
 Patient ID: Brandon Phillips.                 DOB: [birth year]                    MRN: [id number]     HPI: Brandon Phillips. is a 58 y.o. male patient referred to lipid clinic by Damien Braver NP.  Patient of [name].SABRA PMH is significant for HTN, CAD, HLD, lung cancer, and history of smoking.  Patient presents today with wife to discuss lipid management.  Currently managed on atorvastatin  80mg  once daily without reported averse effects.   Former heavy smoker but quit in 2018. Completed lung cancer treatment and is now on immunotherapy. Wife is concerned this may be the cause of his lipid elevations.  Wife cooks all meals at home. Patient denies alcohol consumption.  Current Medications:  Atorvastatin  80mg  daily  Risk Factors:  CAD HTN Former smoker  LDL goal: <70  Labs: TC 181, Trigs 222, HDL 39, LDL 104 (03/16/24)  Cath: There is diffuse coronary calcification.  Left dominant circulation.  LM large-caliber vessel, bifurcates into LAD and CX.  LCx: Dominant vessel and a very large caliber vessel giving origin to large OM1, small OM 2, OM 3, large PDA and PL branch distally.  Proximal circumflex has mild 30% stenosis, large OM1 with proximal 30% stenosis. LAD: It is moderately calcified diffusely.  There is diffuse luminal irregularity.  Gives origin to large D1 with a ostial 50 to 60% stenosis.  After the D1 origin, mid LAD has calcific 50% stenosis, mild disease in the mid to distal segment followed by another 50% stenosis in the midsegment.    LAD ends at the apex, D1 and D3 being large vessels with mild disease.  RCA: Nondominant, proximal to mid 40% stenosis.  Past Medical History:  Diagnosis Date   Cancer (HCC)    lung cancer   CAP (community acquired pneumonia)    COPD (chronic obstructive pulmonary disease) (HCC)    Eczema    HLD (hyperlipidemia)    Primary cancer of left lower lobe of lung (HCC) 09/17/2022   Tobacco use disorder 01/24/2017    Current  Outpatient Medications on File Prior to Visit  Medication Sig Dispense Refill   albuterol  (PROVENTIL ) (2.5 MG/3ML) 0.083% nebulizer solution Take 3 mLs (2.5 mg total) by nebulization every 6 (six) hours as needed for wheezing or shortness of breath. 75 mL 5   albuterol  (VENTOLIN  HFA) 108 (90 Base) MCG/ACT inhaler Inhale 2 puffs into the lungs every 6 (six) hours as needed for wheezing. 2 each 11   amLODipine  (NORVASC ) 5 MG tablet Take 1 tablet (5 mg total) by mouth daily. 90 tablet 3   apixaban  (ELIQUIS ) 2.5 MG TABS tablet Take 1 tablet (2.5 mg total) by mouth 2 (two) times daily. 180 tablet 3   aspirin  EC 81 MG tablet Take 1 tablet (81 mg total) by mouth daily. Swallow whole. 90 tablet 3   atorvastatin  (LIPITOR) 80 MG tablet Take 1 tablet (80 mg total) by mouth daily. 90 tablet 3   azithromycin  (ZITHROMAX ) 250 MG tablet Take 2 tablets on day one then take 1 tablet daily for four additional days 6 tablet 0   budesonide -formoterol  (SYMBICORT ) 80-4.5 MCG/ACT inhaler Inhale 2 puffs into the lungs in the morning and at bedtime. 1 each 11   COENZYME Q10 PO Take 100 mg by mouth in the morning.     folic acid  (FOLVITE ) 1  MG tablet TAKE 1 TABLET BY MOUTH DAILY. START 7 DAYS BEFORE PEMETREXED  CHEMOTHERAPY, CONTINUE UNTIL 21 DAYS AFTER PEMETREXED  COMPLETED 30 tablet 8   gabapentin  (NEURONTIN ) 300 MG capsule TAKE 1 CAPSULE BY MOUTH 3 TIMES A DAY 90 capsule 3   GUAIFENESIN 1200 PO Take 1,200 mg by mouth in the morning and at bedtime.     isosorbide  mononitrate (IMDUR ) 30 MG 24 hr tablet Take 1 tablet (30 mg total) by mouth 2 (two) times daily. 180 tablet 3   lidocaine -prilocaine  (EMLA ) cream Apply a dime size to port-a-cath 1-2 hours prior to access. Cover with saran wrap. 30 g 2   Multiple Vitamin (MULTIVITAMIN) capsule Take 1 capsule by mouth at bedtime.     OHTUVAYRE  3 MG/2.5ML SUSP Take 1 Tube by nebulization in the morning and at bedtime.     Tiotropium Bromide  Monohydrate (SPIRIVA  RESPIMAT) 2.5 MCG/ACT  AERS Inhale 2 puffs into the lungs daily. 1 g 11   triamcinolone  cream (KENALOG ) 0.1 % Apply 1 Application topically 2 (two) times daily. 45 g 3   No current facility-administered medications on file prior to visit.    Allergies  Allergen Reactions   Imdur  [Isosorbide  Nitrate] Other (See Comments)    Headache    Assessment/Plan:  1. Hyperlipidemia - Patient's last LDL of 104 is above goal of <70 despite high intensity statin. Triglycerides are also above goal of <150. Recommend addition of Repatha and Vascepa.  Using demo pen, educated patient on mechanism of action, storage, site selection, administration, and possible adverse effects. Will submit PA and contact patient when approved.  Advised where to look online for copay cards.  Continue atorvastatin  80mg  daily Start Repatha q 2 weeks Start Vascepa 2g BID Recheck lipid panel in 3 months  Chris Leba Tibbitts, PharmD, [place], CDCES, CPP [facility] [address], [place], [place] [address] Phone: [phone]; Fax: [phone] 05/07/2024 4:48 PM

## 2024-05-07 NOTE — Telephone Encounter (Signed)
 Please complete PA for Repatha and Vascepa 2g BID

## 2024-05-10 ENCOUNTER — Other Ambulatory Visit (HOSPITAL_COMMUNITY): Payer: Self-pay

## 2024-05-10 ENCOUNTER — Telehealth: Payer: Self-pay | Admitting: Pharmacy Technician

## 2024-05-10 ENCOUNTER — Telehealth (HOSPITAL_BASED_OUTPATIENT_CLINIC_OR_DEPARTMENT_OTHER): Payer: Self-pay | Admitting: *Deleted

## 2024-05-10 ENCOUNTER — Encounter: Payer: Self-pay | Admitting: Hematology & Oncology

## 2024-05-10 NOTE — Telephone Encounter (Signed)
   Pre-operative Risk Assessment    Patient Name: Brandon Phillips.  DOB: Jul 30, 1965 MRN: [id number]   Date of last office visit: 04/13/24 OLIVIA PAVY, [facility] Date of next office visit: NONE   Request for Surgical Clearance    Procedure:  DML Bx WITH LASER  Date of Surgery:  Clearance 05/17/24                                Surgeon:  DR. OKEY Surgeon's Group or Practice Name:  [facility]  Phone number:  [phone] Fax number:  [phone]   Type of Clearance Requested:   - Medical  - Pharmacy:  Hold Apixaban  (Eliquis )     Type of Anesthesia:  General    Additional requests/questions:    Bonney Niels Jest   05/10/2024, 11:12 AM

## 2024-05-10 NOTE — Telephone Encounter (Signed)
 Pharmacy Patient Advocate Encounter   Received notification from Pt Calls Messages that prior authorization for Vascepa Brandon Phillips is required/requested.   Insurance verification completed.   The patient is insured through [facility].   Per test claim: The current 05/10/24 day co-pay is, $0.00- one month BRAND only .  No PA needed at this time. This test claim was processed through [facility]- copay amounts may vary at other pharmacies due to pharmacy/plan contracts, or as the patient moves through the different stages of their insurance plan.

## 2024-05-10 NOTE — Telephone Encounter (Signed)
 Pharmacy Patient Advocate Encounter   Received notification from Pt Calls Messages that prior authorization for repatha is required/requested.   Insurance verification completed.   The patient is insured through [facility].   Per test claim: PA required; PA submitted to above mentioned insurance via Latent Key/confirmation #/EOC [id number] Status is pending

## 2024-05-11 NOTE — Telephone Encounter (Signed)
 Patient with diagnosis of hx of PE on Eliquis  for anticoagulation.    Procedure:  DML Bx WITH LASER   Date of Surgery:  Clearance 05/17/24       Patient does NOT have Afib; CHADSVASC not applicable   CrCl 89 ml/min Platelet count 248 K  N/A: Patient has not had an Afib/aflutter ablation within the last 3 months or DCCV within the last 30 days  Per office protocol, patient can hold Eliquis  for 1 day prior to procedure.    Patient will not need bridging with Lovenox  (enoxaparin ) around procedure.  **This guidance is not considered finalized until pre-operative APP has relayed final recommendations.**

## 2024-05-12 ENCOUNTER — Other Ambulatory Visit (HOSPITAL_COMMUNITY): Payer: Self-pay

## 2024-05-12 NOTE — Telephone Encounter (Signed)
 Pharmacy Patient Advocate Encounter  Received notification from [facility] that Prior Authorization for repatha has been APPROVED from 05/11/24 to 05/10/27. Ran test claim, Copay is $0.00. This test claim was processed through [facility]- copay amounts may vary at other pharmacies due to pharmacy/plan contracts, or as the patient moves through the different stages of their insurance plan.   PA #/Case ID/Reference #: [id number]

## 2024-05-14 ENCOUNTER — Other Ambulatory Visit: Payer: Self-pay

## 2024-05-14 ENCOUNTER — Encounter (HOSPITAL_COMMUNITY): Payer: Self-pay

## 2024-05-14 ENCOUNTER — Ambulatory Visit (INDEPENDENT_AMBULATORY_CARE_PROVIDER_SITE_OTHER)

## 2024-05-14 ENCOUNTER — Encounter (INDEPENDENT_AMBULATORY_CARE_PROVIDER_SITE_OTHER): Payer: Self-pay

## 2024-05-14 VITALS — BP 133/86 | HR 99 | Temp 98.0°F

## 2024-05-14 DIAGNOSIS — J383 Other diseases of vocal cords: Secondary | ICD-10-CM

## 2024-05-14 DIAGNOSIS — R490 Dysphonia: Secondary | ICD-10-CM

## 2024-05-14 NOTE — Anesthesia Preprocedure Evaluation (Signed)
 Anesthesia Evaluation  Patient identified by MRN, date of birth, ID band Patient awake    Reviewed: Allergy & Precautions, H&P , NPO status , Patient's Chart, lab work & pertinent test results  Airway Mallampati: II  TM Distance: >3 FB Neck ROM: Full    Dental no notable dental hx. (+) Teeth Intact, Dental Advisory Given   Pulmonary neg pulmonary ROS, sleep apnea , pneumonia, COPD, former smoker   Pulmonary exam normal breath sounds clear to auscultation       Cardiovascular Exercise Tolerance: Good hypertension, + angina  + CAD  negative cardio ROS Normal cardiovascular exam Rhythm:Regular Rate:Normal  Cardiac cath 10/13/2023: Impression and recommendations: With patient having on and off chest pain with radiation to the jaw and ST elevations noted on the telemetry, suspect coronary vasospasm as etiology.  Adding vasodilator therapy would be appropriate for management of his hypertension and possible coronary spasm.   Echo 10/11/2023: IMPRESSIONS  1. Left ventricular ejection fraction, by estimation, is 65 to 70%. The  left ventricle has normal function. The left ventricle has no regional  wall motion abnormalities. Left ventricular diastolic parameters are  consistent with Grade I diastolic  dysfunction (impaired relaxation).  2. Right ventricular systolic function is normal. The right ventricular  size is normal. Tricuspid regurgitation signal is inadequate for assessing  PA pressure.  3. No evidence of mitral valve regurgitation.  4. The aortic valve was not well visualized. Aortic valve regurgitation  is not visualized.  5. The inferior vena cava is normal in size with greater than 50%  respiratory variability, suggesting right atrial pressure of 3 mmHg.      Neuro/Psych  Neuromuscular disease negative neurological ROS  negative psych ROS   GI/Hepatic negative GI ROS, Neg liver ROS,,,  Endo/Other  negative  endocrine ROS    Renal/GU negative Renal ROS  negative genitourinary   Musculoskeletal negative musculoskeletal ROS (+)    Abdominal   Peds negative pediatric ROS (+)  Hematology negative hematology ROS (+)   Anesthesia Other Findings   Reproductive/Obstetrics negative OB ROS                              Anesthesia Physical Anesthesia Plan  ASA: 3  Anesthesia Plan: General   Post-op Pain Management: Minimal or no pain anticipated   Induction: Intravenous  PONV Risk Score and Plan: 2 and Ondansetron , Dexamethasone  and TIVA  Airway Management Planned: Natural Airway, Simple Face Mask and Mask  Additional Equipment: None  Intra-op Plan:   Post-operative Plan: Extubation in OR  Informed Consent: I have reviewed the patients History and Physical, chart, labs and discussed the procedure including the risks, benefits and alternatives for the proposed anesthesia with the patient or authorized representative who has indicated his/her understanding and acceptance.       Plan Discussed with: Anesthesiologist and CRNA  Anesthesia Plan Comments: (PAT note written 05/14/2024 by Isaiah Ruder, [name].   ISCUSSION: Patient is a 58 year old male scheduled for the above procedure.   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   Per Flexible fiberoptic laryngoscopy with stroboscopy by Dr. Elena Larry on 05/06/2024: The nasopharynx was patent without mass or lesion.SABRASABRAThe base of tongue was visualized and was symmetric with no  apparent masses or abnormal appearing tissue. There were no signs of a mass or pooling of secretions in the piriform sinuses. The  supraglottic structures were normal... The true vocal cords are mobile. The medial edges were straight but irregular on the left side due to exophytic mass. Closure was incomplete. Periodicity present. The mucosal wave and amplitude were absent on the left side and normal on the right. There is moderate interarytenoid pachydermia and post cricoid edema.   Last cardiology visit was on 04/13/2024 with Parthenia Klinefelter, [name]. He had transient ST elevation on telemetry during chest pain evaluation and underwent LHC on 10/13/2023 showing obstructive CAD with suspected component of vasospasm.  Echo showed normal LV function, EF 65 to 70%, no regional wall motion abnormalities, grade 1 diastolic dysfunction, normal RV systolic function, no significant valvular abnormalities.  He was started on amlodipine  and Imdur .  He had similar episode again in August 2025.  Continue aspirin , amlodipine , Imdur , Lipito.  Consider increasing Imdur  if more frequent episodes.  She also noted 1 prior documented episode of NSVT on home Kardia mobile device but with no recurrence.  He is no longer on metoprolol  to allow treatment for coronary vasospasm.    ENT reached out for preoperative cardiac clearance. Per Parthenia Klinefelter, [name], Patient with known coronary vasospasm but has been overall stable on amlodipine  and Imdur . He should be able to proceed with above surgery without further cardiac testing. Pharmacy team said he could hold eliquis  for 1 day prior.    Anesthesia team to evaluate on the day of surgery. KG: 10/24/2023: Normal sinus rhythm Nonspecific ST abnormality When compared with ECG of 13-Oct-2023 06:26, Criteria for Septal infarct are no longer Present Confirmed by Daneen Perkins ([id number]) on 10/24/2023 10:14:07 AM     CV: Left Heart Catheterization 10/13/2023: Hemodynamic data: LVEDP 12 mmHg.  No pressure gradient across the aortic valve.   Angiographic data: There is diffuse coronary calcification.  Left dominant  circulation. LM large-caliber vessel, bifurcates into LAD and CX. LCx: Dominant vessel and a very large caliber vessel giving origin to large OM1, small OM 2, OM 3, large PDA and PL branch distally.  Proximal circumflex has mild 30% stenosis, large OM1 with proximal 30% stenosis. LAD: It is moderately calcified diffusely.  There is diffuse luminal irregularity.  Gives origin to large D1 with a ostial 50 to 60% stenosis.  After the D1 origin, mid LAD has calcific 50% stenosis, mild disease in the mid to distal segment followed by another 50% stenosis in the midsegment.  LAD ends at the apex, D1 and D3 being large vessels with mild disease. RCA: Nondominant, proximal to mid 40% stenosis.   Virtual FFR using CathWorx: Proximal LAD 0.84, distal LAD 0.82, lesions are not hemodynamically significant.   Impression and recommendations: With patient having on and off chest pain with radiation to the jaw and ST elevations noted on the telemetry, suspect coronary vasospasm as etiology.  Adding vasodilator therapy would be appropriate for management of his hypertension and possible coronary spasm.     Echo 10/11/2023: IMPRESSIONS   1. Left ventricular ejection fraction, by estimation, is 65 to 70%. The  left ventricle has normal function. The left ventricle has no regional  wall motion abnormalities. Left ventricular diastolic parameters are  consistent with Grade I diastolic  dysfunction (impaired relaxation).   2. Right ventricular systolic function is normal. The right ventricular  size is normal. Tricuspid regurgitation signal is inadequate for assessing  PA pressure.   3.  No evidence of mitral valve regurgitation.   4. The aortic valve was not well visualized. Aortic valve regurgitation  is not visualized.   5. The inferior vena cava is normal in size with greater than 50%  respiratory variability, suggesting right atrial pressure of 3 mmHg.        )         Anesthesia Quick  Evaluation

## 2024-05-14 NOTE — Progress Notes (Signed)
 PCP - Zada Palin, NP Cardiologist - Jerel Balding, MD  Chest x-ray - 04/13/24 EKG - 10/04/23 ECHO - 10/11/23 Cardiac Cath - 10/12/23  CPAP - Cannot tolerate wearing mask  Blood Thinner Instructions: Per office protocol, patient can hold Eliquis  for 1 day prior to procedure. Aspirin  Instructions: LD 10/18  Anesthesia review: Y  Patient verbally denies any shortness of breath, fever, cough and chest pain during phone call   -------------  SDW INSTRUCTIONS given:  Your procedure is scheduled on Mon Oct 20th.  Report to [facility] Entrance A at 1:30 P.M., and check in at the Admitting office.  Call this number if you have problems the morning of surgery:  [phone]   Remember:  Do not eat or drink after midnight the night before your surgery    Take these medicines the morning of surgery with A SIP OF WATER  amLODipine  (NORVASC )  gabapentin  (NEURONTIN )  GUAIFENESIN  isosorbide  mononitrate (IMDUR )  budesonide -formoterol  (SYMBICORT )  OHTUVAYRE   SPIRIVA   albuterol  (PROVENTIL )-if needed albuterol  (VENTOLIN  HFA)-if needed (Please bring on the day of surgery)   As of today, STOP taking any Aspirin  (unless otherwise instructed by your surgeon) Aleve, Naproxen, Ibuprofen, Motrin, Advil, Goody's, BC's, all herbal medications, fish oil, and all vitamins.                      Do not wear jewelry, make up, or nail polish            Do not wear lotions, powders, perfumes/colognes, or deodorant.            Do not shave 48 hours prior to surgery.  Men may shave face and neck.            Do not bring valuables to the hospital.            [facility] is not responsible for any belongings or valuables.  Do NOT Smoke (Tobacco/Vaping) 24 hours prior to your procedure If you use a CPAP at night, you may bring all equipment for your overnight stay.   Contacts, glasses, dentures or bridgework may not be worn into surgery.      For patients admitted to the hospital, discharge  time will be determined by your treatment team.   Patients discharged the day of surgery will not be allowed to drive home, and someone needs to stay with them for 24 hours.    Special instructions:   [place]- Preparing For Surgery  Before surgery, you can play an important role. Because skin is not sterile, your skin needs to be as free of germs as possible. You can reduce the number of germs on your skin by washing with CHG (chlorahexidine gluconate) Soap before surgery.  CHG is an antiseptic cleaner which kills germs and bonds with the skin to continue killing germs even after washing.    Oral Hygiene is also important to reduce your risk of infection.  Remember - BRUSH YOUR TEETH THE MORNING OF SURGERY WITH YOUR REGULAR TOOTHPASTE  Please do not use if you have an allergy to CHG or antibacterial soaps. If your skin becomes reddened/irritated stop using the CHG.  Do not shave (including legs and underarms) for at least 48 hours prior to first CHG shower. It is OK to shave your face.  Please follow these instructions carefully.   Shower the NIGHT BEFORE SURGERY and the MORNING OF SURGERY with DIAL Soap.   Pat yourself dry with a CLEAN TOWEL.  Wear CLEAN PAJAMAS to bed the night before surgery  Place CLEAN SHEETS on your bed the night of your first shower and DO NOT SLEEP WITH PETS.   Day of Surgery: Please shower morning of surgery  Wear Clean/Comfortable clothing the morning of surgery Do not apply any deodorants/lotions.   Remember to brush your teeth WITH YOUR REGULAR TOOTHPASTE.   Questions were answered. Patient verbalized understanding of instructions.

## 2024-05-14 NOTE — Progress Notes (Signed)
 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  Per Flexible fiberoptic laryngoscopy with stroboscopy by Dr. Elena Larry on 05/06/2024: The nasopharynx was patent without mass or lesion.SABRASABRAThe base of tongue was visualized and was symmetric with no apparent masses or abnormal appearing tissue. There were no signs of a mass or pooling of secretions in the piriform sinuses. The supraglottic structures were normal... The true vocal cords are mobile. The medial edges were straight but irregular on the left side due to exophytic mass. Closure was incomplete. Periodicity present. The mucosal wave and amplitude were absent on the left side and normal on the right. There is moderate interarytenoid pachydermia and post cricoid edema.  Last cardiology visit was on 04/13/2024 with Parthenia Klinefelter, [name]. He had transient ST elevation on telemetry during chest pain evaluation and underwent LHC on 10/13/2023 showing obstructive CAD with suspected component of vasospasm.  Echo showed normal LV function, EF 65 to 70%, no regional wall motion abnormalities, grade 1 diastolic dysfunction, normal RV systolic function, no significant valvular abnormalities.  He  was started on amlodipine  and Imdur .  He had similar episode again in August 2025.  Continue aspirin , amlodipine , Imdur , Lipito.  Consider increasing Imdur  if more frequent episodes.  She also noted 1 prior documented episode of NSVT on home Kardia mobile device but with no recurrence.  He is no longer on metoprolol  to allow treatment for coronary vasospasm.   ENT reached out for preoperative cardiac clearance. Per Parthenia Klinefelter, [name], Patient with known coronary vasospasm but has been overall stable on amlodipine  and Imdur . He should be able to proceed with above surgery without further cardiac testing. Pharmacy team said he could hold eliquis  for 1 day prior.   Anesthesia team to evaluate on the day of surgery.   VS: Ht 5' 5.5 (1.664 m)   Wt 81 kg   BMI 29.26 kg/m  BP Readings from Last 3 Encounters:  05/06/24 122/79  04/13/24 124/78  04/13/24 118/74   Pulse Readings from Last 3 Encounters:  05/06/24 89  04/13/24 83  04/13/24 80     BP Readings from Last 3 Encounters:  05/06/24 122/79  04/13/24 124/78  04/13/24 118/74   Pulse Readings from Last 3 Encounters:  05/06/24 89  04/13/24 83  04/13/24 80    PROVIDERS: Willo Mini, NP is PCP  Croitoru, Jerel, MD is cardiologist. He is also followed in the [facility] by Darrell Bruckner, PharmD. Continue atorvastatin  and start Repatha and Vascepa recommended at 05/07/2024 visit. Timmy Coy, MD is HEM-ONC   LABS: Most recent results in [facility] include: Lab Results  Component Value Date   WBC 8.8 04/05/2024   HGB 15.4 04/05/2024   HCT 45.1 04/05/2024   PLT 248 04/05/2024   GLUCOSE 101 (H) 04/05/2024   CHOL  181 03/16/2024   TRIG 222 (H) 03/16/2024   HDL 39 (L) 03/16/2024   LDLCALC 104 (H) 03/16/2024   ALT 45 (H) 04/05/2024   AST 25 04/05/2024   NA 140 04/05/2024   K 4.0 04/05/2024   CL 101 04/05/2024   CREATININE 0.91 04/05/2024   BUN 11 04/05/2024   CO2 27 04/05/2024   TSH 2.190 04/05/2024   PSA 1.09  01/11/2022   INR 1.0 10/10/2023   HGBA1C 6.2 (H) 10/12/2023     Home Sleep Study 12/03/2023: Interpretation: Patient demonstrated a moderate level of abnormal breathing events with severe levels of oxygen  desaturation.  Moderate obstructive sleep apnea, AHI (4%) 16.5/h.  Oxygen  desaturation to a nadir 70% and time with O2 sats ration to 88% or less was 204 minutes.   IMAGES: CXR 04/13/2024: IMPRESSION: No acute findings. Stable bilateral perihilar and lingular scarring. Stable appearance of fiducial marker in the right upper lobe.  PET Scan 02/13/2024: IMPRESSION: 1. Previous mildly hypermetabolic nodularity medially in the left lower lobe above the diaphragm resolved. 2. Left perihilar scarring/density has maximum SUV of 2.1, formerly 3.3. Left lower lobe immediately infrahilar peribronchovascular activity along the fiducial is currently 2.6, formerly 3.6. 3. Subtle nodularity within the lingular bronchus without focal metabolic activity. This could simply be from mucous plugging. 4. Diffuse hepatic steatosis. 5. Sigmoid colon diverticulosis. 6. Chronic right maxillary sinusitis. 7. Aortic Atherosclerosis (ICD10-I70.0) and Emphysema (ICD10-J43.9).   MRI Brain 08/24/2022: IMPRESSION: Normal examination. No evidence of metastatic disease.    EKG: 10/24/2023: Normal sinus rhythm Nonspecific ST abnormality When compared with ECG of 13-Oct-2023 06:26, Criteria for Septal infarct are no longer Present Confirmed by Daneen Perkins ([id number]) on 10/24/2023 10:14:07 AM   CV: Left Heart Catheterization 10/13/2023: Hemodynamic data: LVEDP 12 mmHg.  No pressure gradient across the aortic valve.   Angiographic data: There is diffuse coronary calcification.  Left dominant circulation. LM large-caliber vessel, bifurcates into LAD and CX. LCx: Dominant vessel and a very large caliber vessel giving origin to large OM1, small OM 2, OM 3, large PDA and PL branch distally.  Proximal circumflex has  mild 30% stenosis, large OM1 with proximal 30% stenosis. LAD: It is moderately calcified diffusely.  There is diffuse luminal irregularity.  Gives origin to large D1 with a ostial 50 to 60% stenosis.  After the D1 origin, mid LAD has calcific 50% stenosis, mild disease in the mid to distal segment followed by another 50% stenosis in the midsegment.  LAD ends at the apex, D1 and D3 being large vessels with mild disease. RCA: Nondominant, proximal to mid 40% stenosis.   Virtual FFR using CathWorx: Proximal LAD 0.84, distal LAD 0.82, lesions are not hemodynamically significant.  Impression and recommendations: With patient having on and off chest pain with radiation to the jaw and ST elevations noted on the telemetry, suspect coronary vasospasm as etiology.  Adding vasodilator therapy would be appropriate for management of his hypertension and possible coronary spasm.   Echo 10/11/2023: IMPRESSIONS   1. Left ventricular ejection fraction, by estimation, is 65 to 70%. The  left ventricle has normal function. The left ventricle has no regional  wall motion abnormalities. Left ventricular diastolic parameters are  consistent with Grade I diastolic  dysfunction (impaired relaxation).   2. Right ventricular systolic function is normal. The right ventricular  size is normal. Tricuspid regurgitation signal is inadequate for assessing  PA pressure.   3. No evidence of mitral valve regurgitation.   4. The aortic valve was  not well visualized. Aortic valve regurgitation  is not visualized.   5. The inferior vena cava is normal in size with greater than 50%  respiratory variability, suggesting right atrial pressure of 3 mmHg.    Past Medical History:  Diagnosis Date   Cancer (HCC)    lung cancer   CAP (community acquired pneumonia)    COPD (chronic obstructive pulmonary disease) (HCC)    Coronary artery disease    with suspected vasospasm   Eczema    HLD (hyperlipidemia)    PE (pulmonary  thromboembolism) (HCC) 12/04/2022   Primary cancer of left lower lobe of lung (HCC) 09/17/2022   Sleep apnea    cannot tolerate mask   Tobacco use disorder 01/24/2017    Past Surgical History:  Procedure Laterality Date   BRONCHIAL BIOPSY  08/29/2022   Procedure: BRONCHIAL BIOPSIES;  Surgeon: Gladis Leonor HERO, MD;  Location: [facility] ENDOSCOPY;  Service: Pulmonary;;   BRONCHIAL BRUSHINGS  08/29/2022   Procedure: BRONCHIAL BRUSHINGS;  Surgeon: Gladis Leonor HERO, MD;  Location: [facility] ENDOSCOPY;  Service: Pulmonary;;   BRONCHIAL NEEDLE ASPIRATION BIOPSY  08/29/2022   Procedure: BRONCHIAL NEEDLE ASPIRATION BIOPSIES;  Surgeon: Gladis Leonor HERO, MD;  Location: [facility] ENDOSCOPY;  Service: Pulmonary;;   BRONCHIAL WASHINGS  08/29/2022   Procedure: BRONCHIAL WASHINGS;  Surgeon: Gladis Leonor HERO, MD;  Location: [facility] ENDOSCOPY;  Service: Pulmonary;;   CORONARY PRESSURE/FFR WITH 3D MAPPING N/A 10/13/2023   Procedure: Coronary Pressure/FFR w/3D Mapping;  Surgeon: Ladona Heinz, MD;  Location: [facility] INVASIVE CV LAB;  Service: Cardiovascular;  Laterality: N/A;   FIDUCIAL MARKER PLACEMENT  08/29/2022   Procedure: FIDUCIAL MARKER PLACEMENT;  Surgeon: Gladis Leonor HERO, MD;  Location: [facility] ENDOSCOPY;  Service: Pulmonary;;   HERNIA REPAIR     as an infant   IR IMAGING GUIDED PORT INSERTION  10/03/2022   IR RADIOLOGIST EVAL & MGMT  10/21/2022   LEFT HEART CATH AND CORONARY ANGIOGRAPHY N/A 10/13/2023   Procedure: LEFT HEART CATH AND CORONARY ANGIOGRAPHY;  Surgeon: Ladona Heinz, MD;  Location: [facility] INVASIVE CV LAB;  Service: Cardiovascular;  Laterality: N/A;   VIDEO BRONCHOSCOPY WITH ENDOBRONCHIAL ULTRASOUND  08/29/2022   Procedure: VIDEO BRONCHOSCOPY WITH ENDOBRONCHIAL ULTRASOUND;  Surgeon: Gladis Leonor HERO, MD;  Location: [facility] ENDOSCOPY;  Service: Pulmonary;;    MEDICATIONS: No current facility-administered medications for this encounter.    albuterol  (PROVENTIL ) (2.5 MG/3ML) 0.083% nebulizer solution   albuterol  (VENTOLIN  HFA) 108 (90  Base) MCG/ACT inhaler   amLODipine  (NORVASC ) 5 MG tablet   apixaban  (ELIQUIS ) 2.5 MG TABS tablet   aspirin  EC 81 MG tablet   atorvastatin  (LIPITOR) 80 MG tablet   budesonide -formoterol  (SYMBICORT ) 80-4.5 MCG/ACT inhaler   Coenzyme Q10 100 MG capsule   folic acid  (FOLVITE ) 1 MG tablet   gabapentin  (NEURONTIN ) 300 MG capsule   GUAIFENESIN 1200 PO   isosorbide  mononitrate (IMDUR ) 30 MG 24 hr tablet   Multiple Vitamin (MULTIVITAMIN) capsule   OHTUVAYRE  3 MG/2.5ML SUSP   Tiotropium Bromide  Monohydrate (SPIRIVA  RESPIMAT) 2.5 MCG/ACT AERS   triamcinolone  cream (KENALOG ) 0.1 %    Isaiah Ruder, [name] Surgical Short Stay/Anesthesiology [facility] Phone [phone] [facility] Phone [phone] 05/14/2024 2:01 PM
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
## 2024-05-17 ENCOUNTER — Encounter (HOSPITAL_COMMUNITY): Payer: Self-pay

## 2024-05-17 ENCOUNTER — Other Ambulatory Visit

## 2024-05-17 ENCOUNTER — Ambulatory Visit (HOSPITAL_COMMUNITY): Payer: Self-pay | Admitting: Vascular Surgery

## 2024-05-17 ENCOUNTER — Inpatient Hospital Stay

## 2024-05-17 ENCOUNTER — Other Ambulatory Visit: Payer: Self-pay

## 2024-05-17 ENCOUNTER — Encounter (HOSPITAL_COMMUNITY): Admission: RE | Disposition: A | Payer: Self-pay | Source: Home / Self Care

## 2024-05-17 ENCOUNTER — Ambulatory Visit (HOSPITAL_COMMUNITY): Admission: RE | Admit: 2024-05-17 | Discharge: 2024-05-17 | Disposition: A | Discharge status: Home / Self Care

## 2024-05-17 ENCOUNTER — Ambulatory Visit

## 2024-05-17 ENCOUNTER — Ambulatory Visit: Admitting: Medical Oncology

## 2024-05-17 DIAGNOSIS — I25119 Atherosclerotic heart disease of native coronary artery with unspecified angina pectoris: Secondary | ICD-10-CM | POA: Diagnosis not present

## 2024-05-17 DIAGNOSIS — C3432 Malignant neoplasm of lower lobe, left bronchus or lung: Secondary | ICD-10-CM | POA: Diagnosis not present

## 2024-05-17 DIAGNOSIS — G4730 Sleep apnea, unspecified: Secondary | ICD-10-CM | POA: Insufficient documentation

## 2024-05-17 DIAGNOSIS — D141 Benign neoplasm of larynx: Secondary | ICD-10-CM | POA: Insufficient documentation

## 2024-05-17 DIAGNOSIS — Z87891 Personal history of nicotine dependence: Secondary | ICD-10-CM | POA: Insufficient documentation

## 2024-05-17 DIAGNOSIS — I10 Essential (primary) hypertension: Secondary | ICD-10-CM | POA: Diagnosis not present

## 2024-05-17 DIAGNOSIS — Z923 Personal history of irradiation: Secondary | ICD-10-CM | POA: Insufficient documentation

## 2024-05-17 DIAGNOSIS — J449 Chronic obstructive pulmonary disease, unspecified: Secondary | ICD-10-CM | POA: Insufficient documentation

## 2024-05-17 DIAGNOSIS — J383 Other diseases of vocal cords: Secondary | ICD-10-CM | POA: Diagnosis not present

## 2024-05-17 DIAGNOSIS — I2510 Atherosclerotic heart disease of native coronary artery without angina pectoris: Secondary | ICD-10-CM | POA: Diagnosis not present

## 2024-05-17 HISTORY — PX: MICROLARYNGOSCOPY WITH LASER: SHX5972

## 2024-05-17 HISTORY — DX: Atherosclerotic heart disease of native coronary artery without angina pectoris: I25.10

## 2024-05-17 HISTORY — DX: Sleep apnea, unspecified: G47.30

## 2024-05-17 LAB — CBC
HCT: 49.4 % (ref 39.0–52.0)
Hemoglobin: 17 g/dL (ref 13.0–17.0)
MCH: 29.5 pg (ref 26.0–34.0)
MCHC: 34.4 g/dL (ref 30.0–36.0)
MCV: 85.8 fL (ref 80.0–100.0)
Platelets: 253 K/uL (ref 150–400)
RBC: 5.76 MIL/uL (ref 4.22–5.81)
RDW: 13.2 % (ref 11.5–15.5)
WBC: 11 K/uL — ABNORMAL HIGH (ref 4.0–10.5)
nRBC: 0 % (ref 0.0–0.2)

## 2024-05-17 LAB — BASIC METABOLIC PANEL WITH GFR
Anion gap: 14 (ref 5–15)
BUN: 10 mg/dL (ref 6–20)
CO2: 22 mmol/L (ref 22–32)
Calcium: 8.6 mg/dL — ABNORMAL LOW (ref 8.9–10.3)
Chloride: 99 mmol/L (ref 98–111)
Creatinine, Ser: 0.93 mg/dL (ref 0.61–1.24)
GFR, Estimated: >60 mL/min (ref >60)
Glucose, Bld: 92 mg/dL (ref 70–99)
Potassium: 3.6 mmol/L (ref 3.5–5.1)
Sodium: 135 mmol/L (ref 135–145)

## 2024-05-17 SURGERY — MICROLARYNGOSCOPY, WITH PROCEDURE USING LASER
Anesthesia: General | Site: Throat | Laterality: Left

## 2024-05-17 MED ORDER — MIDAZOLAM HCL 2 MG/2ML IJ SOLN
INTRAMUSCULAR | Status: AC
  Filled 2024-05-17: qty 2

## 2024-05-17 MED ORDER — AMOXICILLIN-POT CLAVULANATE 400-57 MG/5ML PO SUSR: 800.0000 mg | Freq: Two times a day (BID) | ORAL | 0 refills | Status: DC

## 2024-05-17 MED ORDER — CHLORHEXIDINE GLUCONATE 0.12 % MT SOLN
15.0000 mL | Freq: Once | OROMUCOSAL | Status: AC
  Administered 2024-05-17: 15 mL via OROMUCOSAL

## 2024-05-17 MED ORDER — CHLORHEXIDINE GLUCONATE 0.12 % MT SOLN
OROMUCOSAL | Status: AC
  Filled 2024-05-17: qty 15

## 2024-05-17 MED ORDER — ORAL CARE MOUTH RINSE: 15.0000 mL | Freq: Once | OROMUCOSAL | Status: AC

## 2024-05-17 MED ORDER — FENTANYL CITRATE (PF) 100 MCG/2ML IJ SOLN
INTRAMUSCULAR | Status: AC
  Filled 2024-05-17: qty 2

## 2024-05-17 MED ORDER — EPINEPHRINE PF 1 MG/ML IJ SOLN
0.3000 mg | Freq: Once | INTRAMUSCULAR | Status: DC
  Filled 2024-05-17 (×2): qty 1

## 2024-05-17 MED ORDER — FENTANYL CITRATE (PF) 100 MCG/2ML IJ SOLN: 25.0000 ug | INTRAMUSCULAR | Status: DC | PRN

## 2024-05-17 MED ORDER — OXYCODONE HCL 5 MG PO TABS: 5.0000 mg | ORAL_TABLET | Freq: Once | ORAL | Status: DC | PRN

## 2024-05-17 MED ORDER — EPHEDRINE SULFATE-NACL 50-0.9 MG/10ML-% IV SOSY
PREFILLED_SYRINGE | INTRAVENOUS | Status: DC | PRN
  Administered 2024-05-17: 5 mg via INTRAVENOUS

## 2024-05-17 MED ORDER — PROPOFOL 500 MG/50ML IV EMUL
INTRAVENOUS | Status: DC | PRN
Start: 2024-05-17 — End: 2024-05-17
  Administered 2024-05-17: 100 ug/kg/min via INTRAVENOUS

## 2024-05-17 MED ORDER — MEPERIDINE HCL 25 MG/ML IJ SOLN: 6.2500 mg | INTRAMUSCULAR | Status: DC | PRN

## 2024-05-17 MED ORDER — OMEPRAZOLE MAGNESIUM 20 MG PO TBEC: 20.0000 mg | DELAYED_RELEASE_TABLET | Freq: Two times a day (BID) | ORAL | 1 refills | Status: AC

## 2024-05-17 MED ORDER — DEXAMETHASONE SOD PHOSPHATE PF 10 MG/ML IJ SOLN
INTRAMUSCULAR | Status: DC | PRN
  Administered 2024-05-17: 10 mg via INTRAVENOUS

## 2024-05-17 MED ORDER — MIDAZOLAM HCL (PF) 2 MG/2ML IJ SOLN
INTRAMUSCULAR | Status: DC | PRN
  Administered 2024-05-17: 2 mg via INTRAVENOUS

## 2024-05-17 MED ORDER — ROCURONIUM BROMIDE 10 MG/ML (PF) SYRINGE
PREFILLED_SYRINGE | INTRAVENOUS | Status: DC | PRN
Start: 2024-05-17 — End: 2024-05-17
  Administered 2024-05-17 (×3): 20 mg via INTRAVENOUS
  Administered 2024-05-17: 50 mg via INTRAVENOUS

## 2024-05-17 MED ORDER — OXYCODONE HCL 5 MG/5ML PO SOLN: 5.0000 mg | Freq: Once | ORAL | Status: DC | PRN

## 2024-05-17 MED ORDER — ACETAMINOPHEN-CODEINE 300-30 MG PO TABS: 1.0000 | ORAL_TABLET | Freq: Four times a day (QID) | ORAL | 0 refills | Status: AC | PRN

## 2024-05-17 MED ORDER — METHYLPREDNISOLONE 4 MG PO TBPK: ORAL_TABLET | ORAL | 0 refills | Status: DC

## 2024-05-17 MED ORDER — PROPOFOL 10 MG/ML IV BOLUS
INTRAVENOUS | Status: AC
  Filled 2024-05-17: qty 20

## 2024-05-17 MED ORDER — PROPOFOL 1000 MG/100ML IV EMUL
INTRAVENOUS | Status: AC
  Filled 2024-05-17: qty 100

## 2024-05-17 MED ORDER — PHENYLEPHRINE HCL-NACL 20-0.9 MG/250ML-% IV SOLN
INTRAVENOUS | Status: DC | PRN
  Administered 2024-05-17: 25 ug/min via INTRAVENOUS

## 2024-05-17 MED ORDER — ONDANSETRON HCL 4 MG/2ML IJ SOLN: 4.0000 mg | Freq: Once | INTRAMUSCULAR | Status: DC | PRN

## 2024-05-17 MED ORDER — OXYMETAZOLINE HCL 0.05 % NA SOLN
NASAL | Status: AC
  Filled 2024-05-17: qty 30

## 2024-05-17 MED ORDER — ONDANSETRON HCL 4 MG/2ML IJ SOLN
INTRAMUSCULAR | Status: DC | PRN
  Administered 2024-05-17: 4 mg via INTRAVENOUS

## 2024-05-17 MED ORDER — FENTANYL CITRATE (PF) 250 MCG/5ML IJ SOLN
INTRAMUSCULAR | Status: DC | PRN
  Administered 2024-05-17 (×2): 50 ug via INTRAVENOUS

## 2024-05-17 MED ORDER — PROPOFOL 10 MG/ML IV BOLUS
INTRAVENOUS | Status: DC | PRN
  Administered 2024-05-17: 100 mg via INTRAVENOUS

## 2024-05-17 MED ORDER — SUGAMMADEX SODIUM 200 MG/2ML IV SOLN
INTRAVENOUS | Status: DC | PRN
  Administered 2024-05-17: 400 mg via INTRAVENOUS

## 2024-05-17 MED ORDER — SODIUM CHLORIDE (PF) 0.9 % IJ SOLN
INTRAMUSCULAR | Status: DC | PRN
  Administered 2024-05-17: 101 mL via TOPICAL

## 2024-05-17 MED ORDER — LACTATED RINGERS IV SOLN: INTRAVENOUS | Status: DC

## 2024-05-17 MED ORDER — SODIUM CHLORIDE 0.9 % IV SOLN
0.1500 ug/kg/min | INTRAVENOUS | Status: AC
  Administered 2024-05-17: .15 ug/kg/min via INTRAVENOUS
  Filled 2024-05-17: qty 1000

## 2024-05-17 MED ORDER — LIDOCAINE 2% (20 MG/ML) 5 ML SYRINGE
INTRAMUSCULAR | Status: DC | PRN
Start: 2024-05-17 — End: 2024-05-17
  Administered 2024-05-17: 60 mg via INTRAVENOUS

## 2024-05-17 SURGICAL SUPPLY — 34 items
BAG COUNTER SPONGE SURGICOUNT (BAG) ×1 IMPLANT
BALLN PULMONARY 10-12 (MISCELLANEOUS) IMPLANT
BALLOON PULM 12 13.5 15X75 (BALLOONS) IMPLANT
BLADE SURG 15 STRL LF DISP TIS (BLADE) IMPLANT
BNDG EYE OVAL 2 1/8 X 2 5/8 (GAUZE/BANDAGES/DRESSINGS) ×2 IMPLANT
CANISTER SUCTION 3000ML PPV (SUCTIONS) ×1 IMPLANT
CNTNR URN SCR LID CUP LEK RST (MISCELLANEOUS) IMPLANT
COVER BACK TABLE 60X90IN (DRAPES) ×1 IMPLANT
COVER MAYO STAND STRL (DRAPES) ×1 IMPLANT
DRAPE HALF SHEET 40X57 (DRAPES) ×1 IMPLANT
GAUZE SPONGE 4X4 12PLY STRL (GAUZE/BANDAGES/DRESSINGS) ×1 IMPLANT
GLOVE BIO SURGEON STRL SZ8 (GLOVE) ×1 IMPLANT
GLOVE BIOGEL PI IND STRL 8 (GLOVE) ×1 IMPLANT
GOWN STRL REUS W/ TWL LRG LVL3 (GOWN DISPOSABLE) IMPLANT
GOWN STRL REUS W/TWL 2XL LVL3 (GOWN DISPOSABLE) ×1 IMPLANT
KIT BASIN OR (CUSTOM PROCEDURE TRAY) ×1 IMPLANT
KIT TURNOVER KIT B (KITS) ×1 IMPLANT
NDL HYPO 18GX1.5 BLUNT FILL (NEEDLE) IMPLANT
NDL HYPO 25GX1X1/2 BEV (NEEDLE) IMPLANT
NEEDLE HYPO 18GX1.5 BLUNT FILL (NEEDLE) ×1 IMPLANT
NEEDLE HYPO 25GX1X1/2 BEV (NEEDLE) IMPLANT
PAD ARMBOARD POSITIONER FOAM (MISCELLANEOUS) ×2 IMPLANT
PATTIES SURGICAL .5X1.5 (GAUZE/BANDAGES/DRESSINGS) ×1 IMPLANT
POSITIONER HEAD DONUT 9IN (MISCELLANEOUS) IMPLANT
SET COLLECT BLD 25X3/4 12 (NEEDLE) ×1 IMPLANT
SOLN 0.9% NACL POUR BTL 1000ML (IV SOLUTION) ×1 IMPLANT
SOLN STERILE WATER BTL 1000 ML (IV SOLUTION) ×1 IMPLANT
SOLUTION ANTFG W/FOAM PAD STRL (MISCELLANEOUS) ×1 IMPLANT
SURGILUBE 2OZ TUBE FLIPTOP (MISCELLANEOUS) IMPLANT
SUT SILK 2 0 PERMA HAND 18 BK (SUTURE) IMPLANT
SYR 3ML LL SCALE MARK (SYRINGE) IMPLANT
SYR TB 1ML LUER SLIP (SYRINGE) ×1 IMPLANT
TOWEL GREEN STERILE FF (TOWEL DISPOSABLE) ×1 IMPLANT
TUBE CONNECTING 12X1/4 (SUCTIONS) ×1 IMPLANT

## 2024-05-17 NOTE — Discharge Instructions (Addendum)
 NO THROAT CLEARING. DRINK WATER IF YOU HAVE THE URGE.  NO TALKING FOR TWO WEEKS. - IN THE IDEAL WORLD, YOU WOULD WAIT 6 WEEKS   OFF WORK FOR ONE WEEK  TAKE MEDICATIONS AS DIRECTED.   You can resume your regular medications  POST-OP Instructions  Vocal Cord Surgery This post-operative instruction sheet is designed to help you care for your voice/throat after surgery and help answer any of the common questions you may have. It is not entirely comprehensive, so if you have any questions, do not hesitate to call the office.   What to Expect: - It is common to have a sore throat for several days after surgery. You may have some tongue numbness/pain or taste changes as well - these are temporary but can take several weeks to resolve. - You can eat and drink anything you normally do - there are no restrictions due to surgery alone.  If you have been recommended any other type of diet, such as an acid reflux diet, you should continue that.  You may want to eat light meals the day of anesthesia to make sure you don't get nauseated. You may have some pain after surgery, even with pain medications. To make this pain more tolerable, you should use Tylenol  and/or ibuprofen. You can take 500mg -600mg  of tylenol  every 6 hours, alternating with to 400-600mg  of ibuprofen every 6 hours, so long as your regular doctor hasn't told you to avoid either of these medications. If you don't take acid reflux medication already, you should add a dose of famotidine 20mg  with any ibuprofen dose over 400mg . If you're not sure whether these medications are safe for you, you should ask Dr Anice or your regular doctor first (particularly if you have a heart condition or history of stomach ulcers or are on blood thinners). If it is more severe pain that doesn't respond to the medications above, you should request or use the prescription narcotic pain medicine you were given. (You can call the office to request a script if you need  one.)  If you have to use narcotics, plan to use a stool softener as well.  - It is very important that you stay well-hydrated and drink plenty of fluids during the recovery period.  Getting dehydrated tends to worsen post-operative pain. - Avoid tobacco products, spicy foods, excessive alcohol, or eating late at night as these may cause heartburn or reflux of stomach acid into the throat and may delay the healing process.  If you are on reflux medication already (such as Prilosec (omeprazole) or Nexium  (esomeprazole )), continue it after surgery.  If you are not on anything regularly and have reflux symptoms in the post-operative period (heartburn, throat burning, excessive throat mucous or throat-clearing, sensation of a lump in your throat) then you can treat these symptoms with over-the-counter Pepcid 20-40mg . - Avoid coughing or throat clearing. If you have the urge to clear your throat, take a sip of water and a hard swallow instead. Drinking plenty of water can help alleviate the urge to clear the throat.  If you cannot control your cough, you may take the narcotic pain medicine as a cough suppressant - it works similar to codeine cough syrup to help decrease cough.  If this doesn't help or the cough is excessive, please call the office. - You can return to normal activity 24-48 hours after surgery; avoid intense exercise for the first week. - Call the office if you experience any of the following: Fever higher  than 101 F Complete loss of voice Difficulty breathing or swallowing Bleeding from the mouth  Voice Use After Surgery: - Follow the voice rest instructions. Rest your voice for 2 weeks. In the ideal world, we would want you to rest your voice for 6 weeks.  Nutrition: - Some patients have less of an appetite after surgery, and it is ok to decrease food intake. However, it is not ok to decrease fluid intake. It is very important to continue to drink plenty of fluids. - Drinking fluids will  help lessen throat discomfort. -You can eat and drink as you normally do, but limit any foods that might cause acid reflux (fried foods, meat, caffeine or alcohol, acidic sauces or fruits).  You may want to eat light meals the day of anesthesia to make sure you don't get nauseated.  Safety Information for Giving and Taking Medications: - Each time you give a medication, read the label. - If your medication is in liquid form, do not measure liquid with a kitchen spoon. There are pediatric measuring devices available at the pharmacy. Ask for one when you get your prescription filled. - If you have questions, ask the pharmacist. - Do not take Tylenol  for pain if you have a history of liver problems. Check with your primary care doctor if you are uncertain. - Do not take ibuprofen for pain if you have a history of stomach bleeding or ulcers and have been told to avoid Non-Steroidal Anti-Inflammatory Drugs (NSAIDs).  Certain blood thinners can also interact with ibuprofen.  Check with your primary care doctor if you are uncertain.  Follow-up Appointment: You should see Dr Anice approximately 2-3 weeks after your surgery. If you don't have an appointment, please call her office to schedule it.   You can resume all of your medications after this procedure
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
## 2024-05-17 NOTE — Transfer of Care (Signed)
 Immediate Anesthesia Transfer of Care Note  Patient: Brandon Phillips.  Procedure(s) Performed: DIRECT MICROLARYNGOSCOPY, BRONCHOSCOPY WITH PROCEDURE USING CO2 LASER AND EXCISION OF LEFT VOCAL FOLD LESION (Left: Throat)  Patient Location: PACU  Anesthesia Type:General  Level of Consciousness: awake, alert , and oriented  Airway & Oxygen  Therapy: Patient Spontanous Breathing and Patient connected to face mask oxygen   Post-op Assessment: Report given to RN, Post -op Vital signs reviewed and stable, and Patient moving all extremities X 4  Post vital signs: Reviewed and stable  Last Vitals:  Vitals Value Taken Time  BP 106/80 05/17/24 18:37  Temp    Pulse 74 05/17/24 18:43  Resp 19 05/17/24 18:43  SpO2 92 % 05/17/24 18:43  Vitals shown include unfiled device data.  Last Pain:  Vitals:   05/17/24 1333  TempSrc:   PainSc: 0-No pain      Patients Stated Pain Goal: 0 (05/17/24 1333)  Complications: No notable events documented.

## 2024-05-17 NOTE — Anesthesia Procedure Notes (Signed)
 Procedure Name: Intubation Date/Time: 05/17/2024 4:34 PM  Performed by: Mollie Olivia SAUNDERS, CRNAPre-anesthesia Checklist: Patient identified, Emergency Drugs available, Suction available and Patient being monitored Patient Re-evaluated:Patient Re-evaluated prior to induction Oxygen  Delivery Method: Circle system utilized Preoxygenation: Pre-oxygenation with 100% oxygen  Induction Type: IV induction Ventilation: Mask ventilation without difficulty Laryngoscope Size: Glidescope and 4 Tube type: MLT Laser Tube: Laser Tube and Cuffed inflated with minimal occlusive pressure - saline Tube size: 5.0 (tenax) mm Number of attempts: 1 Airway Equipment and Method: Stylet, Oral airway and Video-laryngoscopy Placement Confirmation: ETT inserted through vocal cords under direct vision, positive ETCO2 and breath sounds checked- equal and bilateral Tube secured with: Tape (visualized correct placement c surgeon and filled blue saline) Dental Injury: Teeth and Oropharynx as per pre-operative assessment  Difficulty Due To: Difficulty was anticipated Comments: Lesions on left side of vc

## 2024-05-17 NOTE — Brief Op Note (Signed)
 05/17/2024  6:41 PM  PATIENT:  Brandon Phillips.  58 y.o. male  PRE-OPERATIVE DIAGNOSIS:  Vocal cord mass  POST-OPERATIVE DIAGNOSIS:  Vocal cord mass  PROCEDURE:  Procedure(s): DIRECT MICROLARYNGOSCOPY, BRONCHOSCOPY WITH PROCEDURE USING CO2 LASER AND EXCISION OF LEFT VOCAL FOLD LESION (Left)  SURGEON:  Surgeons and Role:    * Anice Riis, DO - Primary  PHYSICIAN ASSISTANT:   ASSISTANTS: none   ANESTHESIA:  General  EBL:  5cc   BLOOD ADMINISTERED:none  DRAINS: none   LOCAL MEDICATIONS USED:  NONE  SPECIMEN:   Frozen anterior and posterior left cord Left cord permanent  DISPOSITION OF SPECIMEN:  PATHOLOGY  COUNTS:  YES  TOURNIQUET:  * No tourniquets in log *  DICTATION: .Note written in EPIC  PLAN OF CARE: Discharge to home after PACU  PATIENT DISPOSITION:  PACU - hemodynamically stable.   Delay start of Pharmacological VTE agent (>24hrs) due to surgical blood loss or risk of bleeding: no

## 2024-05-17 NOTE — Op Note (Addendum)
 Otolaryngology Operative note  Brandon Phillips. Date/Time of Admission: 05/17/2024  1:17 PM  CSN: [id number];MRN:[id number]  DOB: 1966-06-27 Age: 58 y.o. 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 CPT 31540 -- Laryngoscopy, direct, with excision or biopsy of lesion(s) of vocal cords or larynx, including laser surgery  Anesthesia type:  General  Anesthesiologist: Darlyn Rush, MD; Leopoldo Bruckner, MD CRNA: Mollie Olivia SAUNDERS, CRNA   Staff: Circulator: Bari Birmingham, RN Laser Staff: Veda Verla SAILOR, RN; Sherrine Moats, RN Relief Circulator: Vonzell Clive SQUIBB, RN Scrub Person: Jarvis Hashimoto  Implants: * No implants in log *  Specimens: ID Type Source Tests Collected by Time Destination  1 : posterior left vocal fold mass Tissue PATH ENT biopsy SURGICAL PATHOLOGY Anice Riis, DO 05/17/2024 1713   2 : anterior left vocal fold mass Tissue PATH ENT biopsy SURGICAL PATHOLOGY Anice Riis, DO 05/17/2024 1716   3 : Posterior left vocal fold mass routine Tissue PATH ENT biopsy SURGICAL PATHOLOGY Anice Riis, DO 05/17/2024 1739   4 : Left vocal fold mass routine Tissue PATH ENT biopsy SURGICAL PATHOLOGY Anice Riis, DO 05/17/2024 1759     EBL: minimal  Drains: none  Post-op disposition and condition: PACU, hemodynamically stable   Findings: Left true vocal fold papillomatous lesion with extension covering superior and inferior aspect. There was extension to the right subglottis. Vocal fold was soft.  - No evidence of invasion along anterior and posterior frozen pathological sections.   Complications: None apparent  Indications and consent:  Brandon Phillips is a 58 y.o. male with diagnoses above. The patient's options were discussed, including  risks/benefits/alternatives for each option. Patient expressed understanding, and despite these risks, consented and decided to proceed with above procedures. Informed consent was signed before proceeding.  Procedure:  The patient was brought to the operating room and supine on the operating table. After induction of general anesthesia, the airway was secured using a GlideScope to facilitate safe and atraumatic intubation with a laser-safe endotracheal tube. This ensured adequate airway protection throughout the laser procedure.  Following successful intubation, suspension laryngoscopy was performed using a rigid laryngoscope and a 0-degree telescope to provide direct visualization of the larynx and vocal cords. Photos were taken. The surgical microscope was then brought into position to allow for magnified, precise visualization during the biopsy.   Initial evaluation of the lesion revealed papillomatous growths on both the superior and inferior aspects of the left vocal cord. Biopsies were taken from the anterior and posterior aspects of the lesion for frozen section analysis to guide intraoperative decision-making.  The tissue samples were promptly sent to pathology for frozen section processing. Preliminary pathological interpretation was communicated to the surgical team, reporting papilloma with mild dysplasia and no evidence of invasive carcinoma. Given these findings, additional biopsies were obtained to ensure thorough sampling, with these specimens sent for permanent sectioning and detailed histopathologic evaluation. The specimen did cross midline inferior to the true vocal cords.  Using the operating microscope for enhanced visualization, the papillomatous lesions on the superior and inferior portions of the left vocal cord were excised with a CO2 laser. Laser settings were carefully adjusted between 3 and 7 watts, with the majority of the excision performed at 3 watts to minimize thermal  injury while ensuring complete removal of the lesion. Care was taken to not violate  the superficial lamina propria.   Hemostasis was achieved throughout the procedure with minimal bleeding, facilitated by the precise nature of the CO2 laser and topical 1:10,000 pledgets. The excised tissue specimens were submitted in their entirety for both frozen and permanent histological examination to assess invasion and cellular characteristics.  Upon completion of the excision, the surgical field was inspected thoroughly for any residual disease or bleeding. LTA applied. The suspension laryngoscope was carefully removed, and the patient was extubated uneventfully.   After biopsy with extension to the right     Before     after

## 2024-05-18 ENCOUNTER — Encounter (HOSPITAL_COMMUNITY): Payer: Self-pay

## 2024-05-19 DIAGNOSIS — R490 Dysphonia: Secondary | ICD-10-CM | POA: Diagnosis not present

## 2024-05-19 DIAGNOSIS — E785 Hyperlipidemia, unspecified: Secondary | ICD-10-CM | POA: Diagnosis not present

## 2024-05-19 LAB — SURGICAL PATHOLOGY

## 2024-05-19 NOTE — Anesthesia Postprocedure Evaluation (Signed)
 Anesthesia Post Note  Patient: Brandon Phillips.  Procedure(s) Performed: DIRECT MICROLARYNGOSCOPY, BRONCHOSCOPY WITH PROCEDURE USING CO2 LASER AND EXCISION OF LEFT VOCAL FOLD LESION (Left: Throat)     Patient location during evaluation: PACU Anesthesia Type: General Level of consciousness: sedated and patient cooperative Pain management: pain level controlled Vital Signs Assessment: post-procedure vital signs reviewed and stable Respiratory status: spontaneous breathing Cardiovascular status: stable Anesthetic complications: no   No notable events documented.  Last Vitals:  Vitals:   05/17/24 1925 05/17/24 1930  BP:  119/83  Pulse: 72 75  Resp: 18 20  Temp:  36.9 C  SpO2: 93% 94%    Last Pain:  Vitals:   05/17/24 1930  TempSrc:   PainSc: 0-No pain                 Norleen Pope

## 2024-05-24 ENCOUNTER — Encounter: Payer: Self-pay | Admitting: Medical Oncology

## 2024-05-24 ENCOUNTER — Inpatient Hospital Stay

## 2024-05-24 ENCOUNTER — Inpatient Hospital Stay (HOSPITAL_BASED_OUTPATIENT_CLINIC_OR_DEPARTMENT_OTHER): Admitting: Medical Oncology

## 2024-05-24 ENCOUNTER — Inpatient Hospital Stay: Attending: Hematology & Oncology

## 2024-05-24 VITALS — BP 109/80 | HR 69 | Resp 18

## 2024-05-24 VITALS — BP 102/70 | HR 86 | Temp 98.5°F | Resp 19 | Ht 65.0 in | Wt 180.0 lb

## 2024-05-24 DIAGNOSIS — Z79620 Long term (current) use of immunosuppressive biologic: Secondary | ICD-10-CM | POA: Diagnosis not present

## 2024-05-24 DIAGNOSIS — C3432 Malignant neoplasm of lower lobe, left bronchus or lung: Secondary | ICD-10-CM

## 2024-05-24 DIAGNOSIS — Z5112 Encounter for antineoplastic immunotherapy: Secondary | ICD-10-CM | POA: Diagnosis not present

## 2024-05-24 DIAGNOSIS — Z86718 Personal history of other venous thrombosis and embolism: Secondary | ICD-10-CM | POA: Diagnosis not present

## 2024-05-24 DIAGNOSIS — Z87891 Personal history of nicotine dependence: Secondary | ICD-10-CM | POA: Diagnosis not present

## 2024-05-24 DIAGNOSIS — Z86711 Personal history of pulmonary embolism: Secondary | ICD-10-CM | POA: Diagnosis not present

## 2024-05-24 DIAGNOSIS — Z7901 Long term (current) use of anticoagulants: Secondary | ICD-10-CM | POA: Diagnosis not present

## 2024-05-24 LAB — CMP (CANCER CENTER ONLY)
ALT: 39 U/L (ref 0–44)
AST: 20 U/L (ref 15–41)
Albumin: 4 g/dL (ref 3.5–5.0)
Alkaline Phosphatase: 63 U/L (ref 38–126)
Anion gap: 12 (ref 5–15)
BUN: 12 mg/dL (ref 6–20)
CO2: 25 mmol/L (ref 22–32)
Calcium: 8.8 mg/dL — ABNORMAL LOW (ref 8.9–10.3)
Chloride: 101 mmol/L (ref 98–111)
Creatinine: 0.87 mg/dL (ref 0.61–1.24)
GFR, Estimated: >60 mL/min (ref >60)
Glucose, Bld: 193 mg/dL — ABNORMAL HIGH (ref 70–99)
Potassium: 3.9 mmol/L (ref 3.5–5.1)
Sodium: 137 mmol/L (ref 135–145)
Total Bilirubin: 0.5 mg/dL (ref 0.0–1.2)
Total Protein: 6.9 g/dL (ref 6.5–8.1)

## 2024-05-24 LAB — CBC WITH DIFFERENTIAL (CANCER CENTER ONLY)
Abs Immature Granulocytes: 0.03 K/uL (ref 0.00–0.07)
Basophils Absolute: 0 K/uL (ref 0.0–0.1)
Basophils Relative: 0 %
Eosinophils Absolute: 0.2 K/uL (ref 0.0–0.5)
Eosinophils Relative: 2 %
HCT: 45.3 % (ref 39.0–52.0)
Hemoglobin: 15.5 g/dL (ref 13.0–17.0)
Immature Granulocytes: 0 %
Lymphocytes Relative: 8 %
Lymphs Abs: 0.8 K/uL (ref 0.7–4.0)
MCH: 29.8 pg (ref 26.0–34.0)
MCHC: 34.2 g/dL (ref 30.0–36.0)
MCV: 86.9 fL (ref 80.0–100.0)
Monocytes Absolute: 0.6 K/uL (ref 0.1–1.0)
Monocytes Relative: 6 %
Neutro Abs: 8.6 K/uL — ABNORMAL HIGH (ref 1.7–7.7)
Neutrophils Relative %: 84 %
Platelet Count: 214 K/uL (ref 150–400)
RBC: 5.21 MIL/uL (ref 4.22–5.81)
RDW: 13.3 % (ref 11.5–15.5)
WBC Count: 10.3 K/uL (ref 4.0–10.5)
nRBC: 0 % (ref 0.0–0.2)

## 2024-05-24 LAB — TSH: TSH: 1.51 u[IU]/mL (ref 0.350–4.500)

## 2024-05-24 MED ORDER — SODIUM CHLORIDE 0.9 % IV SOLN
1500.0000 mg | Freq: Once | INTRAVENOUS | Status: AC
  Administered 2024-05-24: 1500 mg via INTRAVENOUS
  Filled 2024-05-24: qty 30

## 2024-05-24 MED ORDER — SODIUM CHLORIDE 0.9 % IV SOLN: Freq: Once | INTRAVENOUS | Status: AC

## 2024-05-24 NOTE — Patient Instructions (Signed)

## 2024-05-24 NOTE — Progress Notes (Signed)
 [facility] Hematology and Oncology Follow Up Visit  Brandon Phillips [id number] 1966-01-18 58 y.o. 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 twice daily-start on 12/06/2022 -changed to 2.5 mg p.o. twice daily on 05/12/2023 Status post radiosurgery-completed on 02/11/2023 Durvalumab  1500 mg IV Q6 weeks-s/p cycle #12-- start on 02/17/2023     Interim History:  Brandon Phillips is back for follow-up and consideration of treatment  They report that he has been well. He had surgery on his vocal chord last week. 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 He reports that he is eating, drinking and breathing like normal. He is not on oral steroids.   His heart is being followed closely by Cardiology.  He continues on Eliquis  for the pulmonary embolism.  He has now been about 16 months since he had this.He denies any bleeding.   He continues on low-dose aspirin .  He has had no change in bowel or bladder habits.  He has had no diarrhea..  There is been no leg swelling.  He has had no weakness.  He has had no headache.  His last TSH back in Sept was 2.190  Overall, I would have to set his performance status is probably ECOG 1.    Wt Readings from Last 3 Encounters:  05/24/24 180 lb (81.6 kg)  05/17/24 179 lb (81.2 kg)  04/13/24 178 lb 9.6 oz (81 kg)     Medications:  Current Outpatient Medications:    albuterol  (PROVENTIL ) (2.5 MG/3ML) 0.083% nebulizer solution, Take 3 mLs (2.5 mg total) by nebulization every 6 (six) hours as needed for wheezing or shortness of breath., Disp: 75 mL, Rfl: 5   albuterol  (VENTOLIN  HFA) 108 (90 Base) MCG/ACT inhaler, Inhale 2 puffs into the lungs every 6 (six) hours as needed for wheezing., Disp: 2 each, Rfl: 11   amLODipine  (NORVASC ) 5 MG  tablet, Take 1 tablet (5 mg total) by mouth daily., Disp: 90 tablet, Rfl: 3   apixaban  (ELIQUIS ) 2.5 MG TABS tablet, Take 1 tablet (2.5 mg total) by mouth 2 (two) times daily., Disp: 180 tablet, Rfl: 3   aspirin  EC 81 MG tablet, Take 1 tablet (81 mg total) by mouth daily. Swallow whole., Disp: 90 tablet, Rfl: 3   atorvastatin  (LIPITOR) 80 MG tablet, Take 1 tablet (80 mg total) by mouth daily., Disp: 90 tablet, Rfl: 3   budesonide -formoterol  (SYMBICORT ) 80-4.5 MCG/ACT inhaler, Inhale 2 puffs into the lungs in the morning and at bedtime., Disp: 1 each, Rfl: 11   Coenzyme Q10 100 MG capsule, Take 100 mg by mouth in the morning., Disp: , Rfl:    folic acid  (FOLVITE ) 1 MG tablet, TAKE 1 TABLET BY MOUTH DAILY. START 7 DAYS BEFORE PEMETREXED  CHEMOTHERAPY, CONTINUE UNTIL 21 DAYS AFTER PEMETREXED  COMPLETED, Disp: 30 tablet, Rfl: 8   gabapentin  (NEURONTIN ) 300 MG capsule, TAKE 1 CAPSULE BY MOUTH 3 TIMES A DAY, Disp: 90 capsule, Rfl: 3   GUAIFENESIN 1200 PO, Take 1,200 mg by mouth in the morning and at bedtime., Disp: , Rfl:    isosorbide  mononitrate (IMDUR ) 30 MG 24 hr tablet, Take 1 tablet (30 mg total) by mouth 2 (two) times daily., Disp: 180 tablet, Rfl: 3   Multiple Vitamin (MULTIVITAMIN) capsule, Take 1 capsule by mouth at bedtime., Disp: ,  Rfl:    OHTUVAYRE  3 MG/2.5ML SUSP, Take 1 Tube by nebulization in the morning and at bedtime., Disp: , Rfl:    omeprazole (PRILOSEC OTC) 20 MG tablet, Take 1 tablet (20 mg total) by mouth 2 (two) times daily before a meal., Disp: 60 tablet, Rfl: 1   Tiotropium Bromide  Monohydrate (SPIRIVA  RESPIMAT) 2.5 MCG/ACT AERS, Inhale 2 puffs into the lungs daily., Disp: 1 g, Rfl: 11   triamcinolone  cream (KENALOG ) 0.1 %, Apply 1 Application topically 2 (two) times daily. (Patient taking differently: Apply 1 Application topically 2 (two) times daily as needed (irritation).), Disp: 45 g, Rfl: 3  Allergies:  No Known Allergies   Past Medical History, Surgical history, Social  history, and Family History were reviewed and updated.  Review of Systems: Review of Systems  Constitutional: Negative.   HENT:  Negative.    Eyes: Negative.   Respiratory:  Negative for cough.   Cardiovascular: Negative.   Gastrointestinal: Negative.   Endocrine: Negative.   Genitourinary: Negative.    Musculoskeletal: Negative.   Skin: Negative.   Neurological: Negative.   Hematological: Negative.   Psychiatric/Behavioral: Negative.      Physical Exam: Vitals:   05/24/24 1043  BP: 102/70  Pulse: 86  Resp: 19  Temp: 98.5 F (36.9 C)  SpO2: 94%   Wt Readings from Last 3 Encounters:  05/24/24 180 lb (81.6 kg)  05/17/24 179 lb (81.2 kg)  04/13/24 178 lb 9.6 oz (81 kg)    Physical Exam Vitals reviewed.  HENT:     Head: Normocephalic and atraumatic.  Eyes:     Pupils: Pupils are equal, round, and reactive to light.  Cardiovascular:     Rate and Rhythm: Normal rate and regular rhythm.     Heart sounds: Normal heart sounds.  Pulmonary:     Effort: Pulmonary effort is normal.     Breath sounds: Normal breath sounds.  Abdominal:     General: Bowel sounds are normal.     Palpations: Abdomen is soft.  Musculoskeletal:        General: No tenderness or deformity. Normal range of motion.     Cervical back: Normal range of motion.  Lymphadenopathy:     Cervical: No cervical adenopathy.  Skin:    General: Skin is warm and dry.     Findings: No erythema or rash.  Neurological:     Mental Status: He is alert and oriented to person, place, and time.  Psychiatric:        Behavior: Behavior normal.        Thought Content: Thought content normal.        Judgment: Judgment normal.     Lab Results  Component Value Date   WBC 10.3 05/24/2024   HGB 15.5 05/24/2024   HCT 45.3 05/24/2024   MCV 86.9 05/24/2024   PLT 214 05/24/2024     Chemistry      Component Value Date/Time   NA 137 05/24/2024 1005   NA 139 12/09/2023 0830   K 3.9 05/24/2024 1005   CL 101  05/24/2024 1005   CO2 25 05/24/2024 1005   BUN 12 05/24/2024 1005   BUN 12 12/09/2023 0830   CREATININE 0.87 05/24/2024 1005   CREATININE 1.05 01/11/2022 0000      Component Value Date/Time   CALCIUM  8.8 (L) 05/24/2024 1005   ALKPHOS 63 05/24/2024 1005   AST 20 05/24/2024 1005   ALT 39 05/24/2024 1005   BILITOT 0.5 05/24/2024 1005  Encounter Diagnoses  Name Primary?   Malignant neoplasm of lower lobe of left lung (HCC) Yes   Personal history of venous thrombosis and embolism    Impression and Plan: Brandon Phillips is a very nice 58 year old white male.  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  Again, he does not wish to have surgery.  He has seen Dr. Kerrin of thoracic surgery.  We have him on maintenance therapy with Durvalumab .  He has had 1 year of Durvalumab  and is now being treated every 6 weeks for right now.  PET order placed and to be completed ideally this month.  If that PET scan looks okay, the plan is to move his TX out from every 6 weeks to every 8 weeks.   PET order placed RTC 6 weeks MD, port labs, [place]   Lauraine HERO [place], [place] 10/27/202511:06 AM

## 2024-05-24 NOTE — Patient Instructions (Signed)
 Durvalumab Injection What is this medication? DURVALUMAB (dur VAL ue mab) treats some types of cancer. It works by helping your immune system slow or stop the spread of cancer cells. It is a monoclonal antibody. This medicine may be used for other purposes; ask your health care provider or pharmacist if you have questions. COMMON BRAND NAME(S): IMFINZI What should I tell my care team before I take this medication? They need to know if you have any of these conditions: Allogeneic stem cell transplant (uses someone else's stem cells) Autoimmune diseases, such as Crohn disease, ulcerative colitis, lupus History of chest radiation Nervous system problems, such as Guillain-Barre syndrome, myasthenia gravis Organ transplant An unusual or allergic reaction to durvalumab, other medications, foods, dyes, or preservatives Pregnant or trying to get pregnant Breast-feeding How should I use this medication? This medication is infused into a vein. It is given by your care team in a hospital or clinic setting. A special MedGuide will be given to you before each treatment. Be sure to read this information carefully each time. Talk to your care team about the use of this medication in children. Special care may be needed. Overdosage: If you think you have taken too much of this medicine contact a poison control center or emergency room at once. NOTE: This medicine is only for you. Do not share this medicine with others. What if I miss a dose? Keep appointments for follow-up doses. It is important not to miss your dose. Call your care team if you are unable to keep an appointment. What may interact with this medication? Interactions have not been studied. This list may not describe all possible interactions. Give your health care provider a list of all the medicines, herbs, non-prescription drugs, or dietary supplements you use. Also tell them if you smoke, drink alcohol, or use illegal drugs. Some items may  interact with your medicine. What should I watch for while using this medication? Your condition will be monitored carefully while you are receiving this medication. You may need blood work while taking this medication. This medication may cause serious skin reactions. They can happen weeks to months after starting the medication. Contact your care team right away if you notice fevers or flu-like symptoms with a rash. The rash may be red or purple and then turn into blisters or peeling of the skin. You may also notice a red rash with swelling of the face, lips, or lymph nodes in your neck or under your arms. Tell your care team right away if you have any change in your eyesight. Talk to your care team if you may be pregnant. Serious birth defects can occur if you take this medication during pregnancy and for 3 months after the last dose. You will need a negative pregnancy test before starting this medication. Contraception is recommended while taking this medication and for 3 months after the last dose. Your care team can help you find the option that works for you. Do not breastfeed while taking this medication and for 3 months after the last dose. What side effects may I notice from receiving this medication? Side effects that you should report to your care team as soon as possible: Allergic reactions--skin rash, itching, hives, swelling of the face, lips, tongue, or throat Dry cough, shortness of breath or trouble breathing Eye pain, redness, irritation, or discharge with blurry or decreased vision Heart muscle inflammation--unusual weakness or fatigue, shortness of breath, chest pain, fast or irregular heartbeat, dizziness, swelling of the  ankles, feet, or hands Hormone gland problems--headache, sensitivity to light, unusual weakness or fatigue, dizziness, fast or irregular heartbeat, increased sensitivity to cold or heat, excessive sweating, constipation, hair loss, increased thirst or amount of  urine, tremors or shaking, irritability Infusion reactions--chest pain, shortness of breath or trouble breathing, feeling faint or lightheaded Kidney injury (glomerulonephritis)--decrease in the amount of urine, red or dark brown urine, foamy or bubbly urine, swelling of the ankles, hands, or feet Liver injury--right upper belly pain, loss of appetite, nausea, light-colored stool, dark yellow or brown urine, yellowing skin or eyes, unusual weakness or fatigue Pain, tingling, or numbness in the hands or feet, muscle weakness, change in vision, confusion or trouble speaking, loss of balance or coordination, trouble walking, seizures Rash, fever, and swollen lymph nodes Redness, blistering, peeling, or loosening of the skin, including inside the mouth Sudden or severe stomach pain, bloody diarrhea, fever, nausea, vomiting Side effects that usually do not require medical attention (report these to your care team if they continue or are bothersome): Bone, joint, or muscle pain Diarrhea Fatigue Loss of appetite Nausea Skin rash This list may not describe all possible side effects. Call your doctor for medical advice about side effects. You may report side effects to FDA at 1-800-FDA-1088. Where should I keep my medication? This medication is given in a hospital or clinic. It will not be stored at home. NOTE: This sheet is a summary. It may not cover all possible information. If you have questions about this medicine, talk to your doctor, pharmacist, or health care provider.  2024 Elsevier/Gold Standard (2021-11-27 00:00:00)

## 2024-05-25 LAB — T4: T4, Total: 7 ug/dL (ref 4.5–12.0)

## 2024-05-26 DIAGNOSIS — E785 Hyperlipidemia, unspecified: Secondary | ICD-10-CM | POA: Diagnosis not present

## 2024-05-26 DIAGNOSIS — J449 Chronic obstructive pulmonary disease, unspecified: Secondary | ICD-10-CM | POA: Diagnosis not present

## 2024-05-26 DIAGNOSIS — R490 Dysphonia: Secondary | ICD-10-CM | POA: Diagnosis not present

## 2024-05-31 ENCOUNTER — Encounter (INDEPENDENT_AMBULATORY_CARE_PROVIDER_SITE_OTHER): Payer: Self-pay

## 2024-05-31 ENCOUNTER — Ambulatory Visit (INDEPENDENT_AMBULATORY_CARE_PROVIDER_SITE_OTHER)

## 2024-05-31 VITALS — BP 132/86 | HR 100

## 2024-05-31 DIAGNOSIS — Z9889 Other specified postprocedural states: Secondary | ICD-10-CM

## 2024-05-31 DIAGNOSIS — J383 Other diseases of vocal cords: Secondary | ICD-10-CM
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
## 2024-06-01 ENCOUNTER — Encounter: Payer: Self-pay | Admitting: Pharmacist

## 2024-06-01 ENCOUNTER — Telehealth: Payer: Self-pay | Admitting: Pharmacist

## 2024-06-01 DIAGNOSIS — I2510 Atherosclerotic heart disease of native coronary artery without angina pectoris: Secondary | ICD-10-CM

## 2024-06-01 DIAGNOSIS — E782 Mixed hyperlipidemia: Secondary | ICD-10-CM

## 2024-06-01 MED ORDER — REPATHA SURECLICK 140 MG/ML ~~LOC~~ SOAJ: 1.0000 mL | SUBCUTANEOUS | 1 refills | Status: DC

## 2024-06-01 MED ORDER — ICOSAPENT ETHYL 1 G PO CAPS: 2.0000 g | ORAL_CAPSULE | Freq: Two times a day (BID) | ORAL | 5 refills | Status: DC

## 2024-06-01 NOTE — Telephone Encounter (Signed)
 Contacted pt wife and let her know meds were approved for zero dollar copay. Repatha and Vascepa sent to pharmacy

## 2024-06-04 ENCOUNTER — Encounter (HOSPITAL_COMMUNITY)
Admission: RE | Admit: 2024-06-04 | Discharge: 2024-06-04 | Disposition: A | Discharge status: Home / Self Care | Source: Ambulatory Visit | Attending: Medical Oncology | Admitting: Medical Oncology

## 2024-06-04 DIAGNOSIS — C3432 Malignant neoplasm of lower lobe, left bronchus or lung: Secondary | ICD-10-CM | POA: Diagnosis not present

## 2024-06-04 LAB — GLUCOSE, CAPILLARY: Glucose-Capillary: 101 mg/dL — ABNORMAL HIGH (ref 70–99)

## 2024-06-04 MED ORDER — FLUDEOXYGLUCOSE F - 18 (FDG) INJECTION
8.9700 | Freq: Once | INTRAVENOUS | Status: AC
  Administered 2024-06-04: 8.97 via INTRAVENOUS

## 2024-06-08 ENCOUNTER — Ambulatory Visit: Payer: Self-pay | Admitting: Hematology & Oncology

## 2024-06-09 ENCOUNTER — Telehealth: Payer: Self-pay | Admitting: Cardiovascular Disease

## 2024-06-09 NOTE — Telephone Encounter (Signed)
 Pt has an appt in Jan, the wife called in to see if patient needs to do lab before his appt?

## 2024-06-09 NOTE — Telephone Encounter (Signed)
 Routed to Josefa Beauvais NP to advise if labs needed before Jan appt  Pharmacy team pended a lipid panel, as he is on Repatha

## 2024-06-10 ENCOUNTER — Encounter: Payer: Self-pay | Admitting: Nurse Practitioner

## 2024-06-10 ENCOUNTER — Ambulatory Visit: Admitting: Nurse Practitioner

## 2024-06-10 VITALS — BP 126/62 | HR 89 | Temp 97.6°F | Ht 65.5 in | Wt 187.2 lb

## 2024-06-10 DIAGNOSIS — C3432 Malignant neoplasm of lower lobe, left bronchus or lung: Secondary | ICD-10-CM

## 2024-06-10 DIAGNOSIS — J449 Chronic obstructive pulmonary disease, unspecified: Secondary | ICD-10-CM | POA: Diagnosis not present

## 2024-06-10 DIAGNOSIS — J9611 Chronic respiratory failure with hypoxia: Secondary | ICD-10-CM | POA: Diagnosis not present

## 2024-06-10 DIAGNOSIS — G4733 Obstructive sleep apnea (adult) (pediatric): Secondary | ICD-10-CM

## 2024-06-10 DIAGNOSIS — J383 Other diseases of vocal cords: Secondary | ICD-10-CM

## 2024-06-10 MED ORDER — BREZTRI AEROSPHERE 160-9-4.8 MCG/ACT IN AERO: 2.0000 | INHALATION_SPRAY | Freq: Two times a day (BID) | RESPIRATORY_TRACT | 11 refills | Status: AC

## 2024-06-10 NOTE — Telephone Encounter (Signed)
 Emelia Josefa HERO, NP to [facility] Triage (Selected Message)     06/10/24  6:58 AM No further labs are needed prior to his appointment.  Thank you.   JC   Wife aware of the above information.  She had no further questions at the time of the call.

## 2024-06-10 NOTE — Progress Notes (Signed)
 @Patient  ID: Brandon Phillips., male    DOB: Sep 04, 1965, 58 y.o.   MRN: [id number]  Chief Complaint  Patient presents with   Obstructive Sleep Apnea    Pt states he sent the machine back due to his o2 dropping and it not helping his sleep. Pt's wife states the snoring has improved since his throat surgery     Referring provider: Willo Mini, NP  HPI: 58 year old male, former smoker followed for NSCLC of left lung and COPD. He is a patient of Dr. Reeves and last seen in 04/13/2024. Past medical history significant for HTN, allergic rhinitis.   TEST/EVENTS:  07/25/2022 LDCT chest: Atherosclerosis.  Spiculated solid central left lower lobe lung mass measuring 35 mm, new.  Solid peripheral right upper lobe pulmonary nodule measuring 7.9 mm. 08/14/2022 PET scan: Low-level hypermetabolism of the right upper lobe pulmonary nodule.  Hypermetabolism of the left lower lobe lung mass and ipsilateral hilar nodal metastasis. 08/26/2022 PFT: FVC 66, FEV1 42, ratio 48, TLC 122, DLCOcor 57.  No BD. 11/19/2022 CXR: stable left lower lobe mass. New right-sided chest wall port.  12/04/2022 CTA chest: small subsegmental RLL PE. LLL perihilar mass, 2.2x1.8 cm. No LAD. No acute consolidation.  01/01/2023 PET: LLL nodule with intense hypermetabolism despite decrease in size. Hypermetabolic left hilar node. Intense metabolic activity in right shoulder AC joint, no CT lesion evident and favors non-neoplastic activity. No distant metastatic disease. Post radiation change in marrow of lower thoracic spine.  02/13/2023 CXR: unchanged left perihilar mass. No acute process 02/13/2024 PET scan: chronic right maxillary sinusitis. Mild hypermetabolism nodularity LLL, resoled. Left perihilar scarring/density with reduced SUV. LLL activity hypermetabolism decreased. Emphysema. Air bronchograms. Subtle nodularity along lingular bronchus without metabolism. Small fiducial activity in RUL. Right atrium. Atherosclerosis.   12/03/2023 HST: AHI 16/h, SpO2 low 70% >> Moderate OSA  09/24/2023: OV with Dr. Geronimo. Head cold around 3 weeks ago with yellow sputum. Feels he has recovered but still with increased cough. Still with sputum. Wife reports whistling noise at night. Immunotherapy continues. ONO completed 09/20/2022. </88% for 1 min and 5 seconds on 2 lpm. Has not seen sleep specialist - referral placed. Treated for mild AECOPD with doxycycline  and prednisone . Brandon prescribe Ohtuvayre . Prevnar vaccine given.   11/17/2023: Ov with Dr. Neysa for OSA consult. ONO with mild desats on 2 lpm O2. HST ordered.   04/13/2024: OV with Brandon Doss NP. Brandon Phillips. Brandon Phillips is a 58 year old male with COPD who presents with worsening hoarseness and increased cough with sputum.  He has experienced hoarseness for the past year, which has progressively worsened. The hoarseness began after starting Breztri , and he has been inconsistently gargling after using the inhaler, despite using a spacer. He has chronic throat clearing, which he also feels has progressed. No difficulties swallowing or sore throat.  He has been coughing up white sputum, which has become more frequent and thicker over the past two weeks. No postnasal drainage or sinus issues. There has been no known sick exposures. He denies fevers, chills, hemoptysis. Eating and drinking normally.  His oxygen  levels have been stable. He has been experiencing more congestion and cough. He has not been using his rescue inhaler as often as he should and has not started the nebulized Ohtuvayre  prescribed in February due to insurance issues. He has moderate sleep apnea, with an average of sixteen events per hour, and uses oxygen  at night. He reports feeling fine upon waking but is unsure about  fatigue later in the day. He does snore. No weight loss or hemoptysis.  06/10/2024: Today - follow up Discussed the use of AI scribe software for clinical note transcription with the patient, who  gave verbal consent to proceed. History of Present Illness Brandon Phillips. Brandon Phillips is a 58 year old male with moderate sleep apnea and severe COPD who presents for follow-up.  At our last visit, I referred him to ENT due to ongoing voice hoarseness. He was found to have a laryngeal mass, initially presumed cancerous. He was referred to H&N surgeon, Brandon Phillips, and underwent vocal cord surgery. 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 He underwent excision and laser treatment. He has recovered well postoperatively. Plan is to have another procedure in the next 3 months to address the right side. His hoarseness has improved some since the surgery. No worsening speech issues.   He was also started on CPAP therapy; however, he sent the machine back because he was experiencing difficulty exhaling even after bleeding his oxygen  through the CPAP. Post-throat surgery, his snoring has improved by 90% according to his wife. No witnessed apneas. No drowsy driving. Back to using just his oxygen  at night.   He has severe COPD and uses Symbicort  and Spiriva , having switched from Breztri  due to prior concerns for ICS contributing to hoarseness. Breztri  was more affordable due to a copay card. He is also using Ohtuvayre . Unsure if it's made a difference in his breathing. He occasionally uses oxygen  during the day, especially at work or when shopping for extended periods. He has not noticed significant changes in his oxygen  levels post-surgery.  His recent PET scan showed no active cancer.   Still has some occasional throat clearing and cough. Overall, feels he is doing better than our last visit from a respiratory standpoint.     No Known Allergies   Immunization History  Administered Date(s) Administered   PFIZER(Purple Top)SARS-COV-2 Vaccination 08/03/2020, 08/25/2020   PNEUMOCOCCAL CONJUGATE-20 09/24/2023   Pneumococcal Polysaccharide-23 01/15/2018   Tdap 01/15/2018   Zoster Recombinant(Shingrix)  07/30/2019, 11/27/2019    Past Medical History:  Diagnosis Date   Cancer (HCC)    lung cancer   CAP (community acquired pneumonia)    COPD (chronic obstructive pulmonary disease) (HCC)    Coronary artery disease    with suspected vasospasm   Eczema    HLD (hyperlipidemia)    PE (pulmonary thromboembolism) (HCC) 12/04/2022   Primary cancer of left lower lobe of lung (HCC) 09/17/2022   Sleep apnea    cannot tolerate mask   Tobacco use disorder 01/24/2017    Tobacco History: Social History   Tobacco Use  Smoking Status Former   Current packs/day: 0.00   Average packs/day: 1.5 packs/day for 35.0 years (52.5 ttl pk-yrs)   Types: Cigarettes   Start date: 03/30/1982   Quit date: 03/30/2017   Years since quitting: 7.2  Smokeless Tobacco Never  Tobacco Comments   Quit smoking cigarettes in 2018.  Quit vaping in 07/2021   Counseling given: Not Answered Tobacco comments: Quit smoking cigarettes in 2018.  Quit vaping in 07/2021   Outpatient Medications Prior to Visit  Medication Sig Dispense Refill   albuterol  (PROVENTIL ) (2.5 MG/3ML) 0.083% nebulizer solution Take 3 mLs (2.5 mg total) by nebulization every 6 (six) hours as needed for wheezing or shortness of breath. 75 mL 5   albuterol  (VENTOLIN  HFA) 108 (90 Base) MCG/ACT inhaler Inhale 2 puffs into the lungs every 6 (six) hours as needed for  wheezing. 2 each 11   amLODipine  (NORVASC ) 5 MG tablet Take 1 tablet (5 mg total) by mouth daily. 90 tablet 3   apixaban  (ELIQUIS ) 2.5 MG TABS tablet Take 1 tablet (2.5 mg total) by mouth 2 (two) times daily. 180 tablet 3   aspirin  EC 81 MG tablet Take 1 tablet (81 mg total) by mouth daily. Swallow whole. 90 tablet 3   atorvastatin  (LIPITOR) 80 MG tablet Take 1 tablet (80 mg total) by mouth daily. 90 tablet 3   Coenzyme Q10 100 MG capsule Take 100 mg by mouth in the morning.     Evolocumab (REPATHA SURECLICK) 140 MG/ML SOAJ Inject 140 mg into the skin every 14 (fourteen) days. 6 mL 1   folic acid   (FOLVITE ) 1 MG tablet TAKE 1 TABLET BY MOUTH DAILY. START 7 DAYS BEFORE PEMETREXED  CHEMOTHERAPY, CONTINUE UNTIL 21 DAYS AFTER PEMETREXED  COMPLETED 30 tablet 8   gabapentin  (NEURONTIN ) 300 MG capsule TAKE 1 CAPSULE BY MOUTH 3 TIMES A DAY 90 capsule 3   GUAIFENESIN 1200 PO Take 1,200 mg by mouth in the morning and at bedtime.     icosapent Ethyl (VASCEPA) 1 g capsule Take 2 capsules (2 g total) by mouth 2 (two) times daily. 120 capsule 5   isosorbide  mononitrate (IMDUR ) 30 MG 24 hr tablet Take 1 tablet (30 mg total) by mouth 2 (two) times daily. 180 tablet 3   Multiple Vitamin (MULTIVITAMIN) capsule Take 1 capsule by mouth at bedtime.     OHTUVAYRE  3 MG/2.5ML SUSP Take 1 Tube by nebulization in the morning and at bedtime.     omeprazole (PRILOSEC OTC) 20 MG tablet Take 1 tablet (20 mg total) by mouth 2 (two) times daily before a meal. 60 tablet 1   triamcinolone  cream (KENALOG ) 0.1 % Apply 1 Application topically 2 (two) times daily. (Patient taking differently: Apply 1 Application topically 2 (two) times daily as needed (irritation).) 45 g 3   budesonide -formoterol  (SYMBICORT ) 80-4.5 MCG/ACT inhaler Inhale 2 puffs into the lungs in the morning and at bedtime. 1 each 11   Tiotropium Bromide  Monohydrate (SPIRIVA  RESPIMAT) 2.5 MCG/ACT AERS Inhale 2 puffs into the lungs daily. 1 g 11   No facility-administered medications prior to visit.     Review of Systems: as above     Physical Exam:  BP 126/62   Pulse 89   Temp 97.6 F (36.4 C)   Ht 5' 5.5 (1.664 m)   Wt 187 lb 3.2 oz (84.9 kg)   SpO2 93% Comment: ra  BMI 30.68 kg/m   GEN: Pleasant, interactive, well-kempt; in no acute distress. HEENT:  Normocephalic and atraumatic. PERRLA. Sclera white. Nasal turbinates pink, moist and patent bilaterally. No rhinorrhea present. Oropharynx pink and moist, without exudate or edema. No lesions, ulcerations, or postnasal drip. Hoarse voice quality  NECK:  Supple w/ fair ROM. No JVD present. Thyroid   symmetrical with no goiter or nodules palpated. No lymphadenopathy.   CV: RRR, no m/r/g, no peripheral edema. Pulses intact, +2 bilaterally. No cyanosis, pallor or clubbing. PULMONARY:  Unlabored, regular breathing. Clear bilaterally A&P w/o wheezes/rales/rhonchi. No accessory muscle use.  GI: BS present and normoactive. Soft, non-tender to palpation.  MSK: No erythema, warmth or tenderness. Cap refil <2 sec all extrem.  Neuro: A/Ox3. No focal deficits noted.   Skin: Warm, no lesions or rashe Psych: Normal affect and behavior. Judgement and thought content appropriate.     Lab Results:  CBC    Component Value Date/Time   WBC  10.3 05/24/2024 1005   WBC 11.0 (H) 05/17/2024 1339   RBC 5.21 05/24/2024 1005   HGB 15.5 05/24/2024 1005   HGB 14.9 10/16/2023 1206   HCT 45.3 05/24/2024 1005   HCT 43.6 10/16/2023 1206   PLT 214 05/24/2024 1005   PLT 281 10/16/2023 1206   MCV 86.9 05/24/2024 1005   MCV 88 10/16/2023 1206   MCH 29.8 05/24/2024 1005   MCHC 34.2 05/24/2024 1005   RDW 13.3 05/24/2024 1005   RDW 13.9 10/16/2023 1206   LYMPHSABS 0.8 05/24/2024 1005   LYMPHSABS 0.7 04/16/2023 1109   MONOABS 0.6 05/24/2024 1005   EOSABS 0.2 05/24/2024 1005   EOSABS 0.2 04/16/2023 1109   BASOSABS 0.0 05/24/2024 1005   BASOSABS 0.1 04/16/2023 1109    BMET    Component Value Date/Time   NA 137 05/24/2024 1005   NA 139 12/09/2023 0830   K 3.9 05/24/2024 1005   CL 101 05/24/2024 1005   CO2 25 05/24/2024 1005   GLUCOSE 193 (H) 05/24/2024 1005   BUN 12 05/24/2024 1005   BUN 12 12/09/2023 0830   CREATININE 0.87 05/24/2024 1005   CREATININE 1.05 01/11/2022 0000   CALCIUM  8.8 (L) 05/24/2024 1005   GFRNONAA >60 05/24/2024 1005   GFRNONAA 88 04/06/2020 0708   GFRAA 102 04/06/2020 0708    BNP    Component Value Date/Time   BNP 87.1 02/17/2023 0930     Imaging:  NM PET Image Restag (PS) Skull Base To Thigh Result Date: 06/08/2024 EXAM: PET AND CT SKULL BASE TO MID THIGH  06/04/2024 08:47:52 AM TECHNIQUE: RADIOPHARMACEUTICAL: 8.97 mCi F-18 FDG Uptake time 60 minutes. Glucose level 101 mg/dl. PET imaging was acquired from the base of the skull to the mid thighs. Non-contrast enhanced computed tomography was obtained for attenuation correction and anatomic localization. COMPARISON: 02/13/2024 CLINICAL HISTORY: lung cancer FINDINGS: HEAD AND NECK: No hypermetabolic cervical lymph nodes are identified. Bilateral carotid atherosclerosis. CHEST: There are no hypermetabolic mediastinal, hilar or axillary lymph nodes. Mild low level, nonfocal activity about the left hilum in the setting of ground glass and septal thickening, maximally SUV 3.2, with no well-defined residual or locally recurrent disease. Interstitial thickening within the peribronchovascular left lower lobe is increased, including on image 49/7, with no focal PET correlate. Port-A-Cath tip at mid right atrium. Aortic and coronary artery calcifications. Similar trace left pleural thickening. Moderate centrilobular emphysema. Fiducial in the right upper lobe laterally. ABDOMEN AND PELVIS: There is no hypermetabolic activity within the liver, adrenal glands, spleen or pancreas. There is no hypermetabolic nodal activity in the abdomen or pelvis. Normal adrenal glands. Mild hepatic steatosis. Abdominal aortic atherosclerosis. Scattered colonic diverticula. Physiologic activity within the gastrointestinal and genitourinary systems. BONES AND SOFT TISSUE: There is no hypermetabolic activity to suggest osseous metastatic disease. IMPRESSION: 1. Primarily similar left perihilar soft tissue thickening and nonfocal, low level activity, all favored to be treatment related. There is increased interstitial thickening within the peribronchovascular left lower lobe which may be related to evolving radiation change. No focal hypermetabolism in this region to suggest local recurrence. 2. Incidental findings, including: Aortic atherosclerosis  (icd10-i70.0), coronary artery atherosclerosis, and emphysema (icd10-j43.9). Hepatic steatosis. Electronically signed by: Rockey Kilts MD 06/08/2024 08:53 AM EST RP Workstation: [id number]    durvalumab  (IMFINZI ) 1,500 mg in sodium chloride  0.9 % 100 mL chemo infusion     Date Action Dose Route User   05/24/2024 1301 Infusion Verify (none) Intravenous Treyden, Hakim, RN   05/24/2024 1300 Rate/Dose Change (none)  Intravenous Nickalus, Thornsberry, RN   05/24/2024 1259 Rate/Dose Change (none) Intravenous Titan, Karner, RN   05/24/2024 1150 Infusion Verify (none) Intravenous Coe, Angelos, RN   05/24/2024 1150 New Bag/Given 1,500 mg Intravenous Claudene France, RN      0.9 %  sodium chloride  infusion     Date Action Dose Route User   05/24/2024 1309 Rate/Dose Change (none) Intravenous Milt, Coye, RN   05/24/2024 1304 Rate/Dose Change (none) Intravenous Jaydien, [place], RN   05/24/2024 1150 Rate/Dose Change (none) Intravenous [place]oma, Merida, RN   05/24/2024 1117 New Bag/Given (none) Intravenous Dorlene Ronal SQUIBB, RN          Latest Ref Rng & Units 08/26/2022    3:25 PM  PFT Results  FVC-Pre L 2.75   FVC-Predicted Pre % 66   FVC-Post L 2.77   FVC-Predicted Post % 67   Pre FEV1/FVC % % 48   Post FEV1/FCV % % 48   FEV1-Pre L 1.33   FEV1-Predicted Pre % 42   FEV1-Post L 1.33   DLCO uncorrected ml/min/mmHg 15.07   DLCO UNC% % 61   DLCO corrected ml/min/mmHg 14.04   DLCO COR %Predicted % 57   DLVA Predicted % 60   TLC L 7.30   TLC % Predicted % 122   RV % Predicted % 235     No results found for: NITRICOXIDE      Assessment & Plan:   COPD, severe (HCC) Severe COPD. Last exacerbation 03/2024. Resolved. Clinically stable today. 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 Due to this and cost effectiveness of Breztri , Brandon switch him back to Breztri  2 puffs bid. Advised him to notify if voice hoarseness worsens with the step up in ICS. Aware of side  effect profile. Oral hygiene reviewed. Continue Ohtuvayre . Graded exercises encouraged. Action plan in place.  Patient Instructions  Continue Albuterol  inhaler 2 puffs or 3 mL neb every 6 hours as needed for shortness of breath or wheezing. Use nebs 2-4 times a day until symptoms improve. Follow with flutter valve Continue guaifenesin 1200 mg Twice daily for chest congestion/cough Continue Flonase  nasal spray 2 sprays each nostril daily for nasal congestion/drainage  Continue Eliquis  Twice daily  Continue Ohtuvayre   Continue supplemental oxygen  3 lpm on POC with activity and 2 lpm continuous at night for goal >88-90% until you start CPAP.    Switch back to Breztri  2 puffs Twice daily. Brush tongue and rinse mouth afterwards If your hoarseness gets worse, let me know and we'll switch back to the lower dosed Symbicort  and Spiriva     We discussed how untreated sleep apnea puts an individual at risk for cardiac arrhthymias, pulm HTN, DM, stroke and increases their risk for daytime accidents. We also briefly reviewed treatment options including weight loss, side sleeping position, oral appliance, CPAP therapy or referral to ENT for possible surgical options  Since your symptoms have improved with the recent surgery, we Brandon wait until your final vocal cord surgery and then repeat a home sleep study to see if the mass removal reduced your sleep apnea.  If you do still have moderate to severe sleep apnea, we Brandon need to get you back on PAP therapy and Brandon put you in the lab for a titration study at that point    Follow up in March after your next surgery with Dr. Geronimo. If symptoms do not improve or worsen, please contact office for sooner follow up or seek emergency care.  Moderate obstructive sleep apnea Moderate OSA. We discussed how untreated sleep apnea puts an individual at risk for cardiac arrhthymias, pulm HTN, DM, stroke and increases their risk for daytime accidents. We also  briefly reviewed treatment options including weight loss, side sleeping position, oral appliance, CPAP therapy or referral to ENT for possible surgical options. Prior decision to start CPAP; however, he self discontinued due to tolerance/difficulties. Following vocal cord surgery and lesion excision, snoring has mostly resolved. Brandon await his next surgery and then plan for repeat HST. If he does still have moderate to severe sleep apnea, recommend resuming CPAP and obtaining in lab titration study. Safe driving practices reviewed. Healthy weight management encouraged.   Chronic respiratory failure with hypoxia (HCC) Stable without increased O2 requirement. On room air during OV today without desaturations. Goal >88-90%.   Malignant neoplasm of lower lobe of left lung (HCC) Continues on immunotherapy. Tolerating well thus far. Stable PET scan. Follow up with Dr. Timmy as scheduled.   Vocal fold leukoplakia See above. Follow up with H&N surgery/ENT as scheduled      I spent 35 minutes of dedicated to the care of this patient on the date of this encounter to include pre-visit review of records, face-to-face time with the patient discussing conditions above, post visit ordering of testing, clinical documentation with the electronic health record, making appropriate referrals as documented, and communicating necessary findings to members of the patients care team.  Comer LULLA Rouleau, NP 06/10/2024  Pt aware and understands NP's role.

## 2024-06-10 NOTE — Assessment & Plan Note (Signed)
 Moderate OSA. We discussed how untreated sleep apnea puts an individual at risk for cardiac arrhthymias, pulm HTN, DM, stroke and increases their risk for daytime accidents. We also briefly reviewed treatment options including weight loss, side sleeping position, oral appliance, CPAP therapy or referral to ENT for possible surgical options. Prior decision to start CPAP; however, he self discontinued due to tolerance/difficulties. Following vocal cord surgery and lesion excision, snoring has mostly resolved. Will await his next surgery and then plan for repeat HST. If he does still have moderate to severe sleep apnea, recommend resuming CPAP and obtaining in lab titration study. Safe driving practices reviewed. Healthy weight management encouraged.

## 2024-06-10 NOTE — Assessment & Plan Note (Signed)
 See above. Follow up with H&N surgery/ENT as scheduled

## 2024-06-10 NOTE — Assessment & Plan Note (Signed)
 Continues on immunotherapy. Tolerating well thus far. Stable PET scan. Follow up with Dr. Timmy as scheduled.

## 2024-06-10 NOTE — Patient Instructions (Signed)
 Continue Albuterol  inhaler 2 puffs or 3 mL neb every 6 hours as needed for shortness of breath or wheezing. Use nebs 2-4 times a day until symptoms improve. Follow with flutter valve Continue guaifenesin 1200 mg Twice daily for chest congestion/cough Continue Flonase  nasal spray 2 sprays each nostril daily for nasal congestion/drainage  Continue Eliquis  Twice daily  Continue Ohtuvayre   Continue supplemental oxygen  3 lpm on POC with activity and 2 lpm continuous at night for goal >88-90% until you start CPAP.    Switch back to Breztri  2 puffs Twice daily. Brush tongue and rinse mouth afterwards If your hoarseness gets worse, let me know and we'll switch back to the lower dosed Symbicort  and Spiriva     We discussed how untreated sleep apnea puts an individual at risk for cardiac arrhthymias, pulm HTN, DM, stroke and increases their risk for daytime accidents. We also briefly reviewed treatment options including weight loss, side sleeping position, oral appliance, CPAP therapy or referral to ENT for possible surgical options  Since your symptoms have improved with the recent surgery, we will wait until your final vocal cord surgery and then repeat a home sleep study to see if the mass removal reduced your sleep apnea.  If you do still have moderate to severe sleep apnea, we will need to get you back on PAP therapy and will put you in the lab for a titration study at that point    Follow up in March after your next surgery with Dr. Geronimo. If symptoms do not improve or worsen, please contact office for sooner follow up or seek emergency care.

## 2024-06-10 NOTE — Assessment & Plan Note (Signed)
 Severe COPD. Last exacerbation 03/2024. Resolved. Clinically stable today. 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 Due to this and cost effectiveness of Breztri , will switch him back to Breztri  2 puffs bid. Advised him to notify if voice hoarseness worsens with the step up in ICS. Aware of side effect profile. Oral hygiene reviewed. Continue Ohtuvayre . Graded exercises encouraged. Action plan in place.  Patient Instructions  Continue Albuterol  inhaler 2 puffs or 3 mL neb every 6 hours as needed for shortness of breath or wheezing. Use nebs 2-4 times a day until symptoms improve. Follow with flutter valve Continue guaifenesin 1200 mg Twice daily for chest congestion/cough Continue Flonase  nasal spray 2 sprays each nostril daily for nasal congestion/drainage  Continue Eliquis  Twice daily  Continue Ohtuvayre   Continue supplemental oxygen  3 lpm on POC with activity and 2 lpm continuous at night for goal >88-90% until you start CPAP.    Switch back to Breztri  2 puffs Twice daily. Brush tongue and rinse mouth afterwards If your hoarseness gets worse, let me know and we'll switch back to the lower dosed Symbicort  and Spiriva     We discussed how untreated sleep apnea puts an individual at risk for cardiac arrhthymias, pulm HTN, DM, stroke and increases their risk for daytime accidents. We also briefly reviewed treatment options including weight loss, side sleeping position, oral appliance, CPAP therapy or referral to ENT for possible surgical options  Since your symptoms have improved with the recent surgery, we will wait until your final vocal cord surgery and then repeat a home sleep study to see if the mass removal reduced your sleep apnea.  If you do still have moderate to severe sleep apnea, we will need to get you back on PAP therapy and will put you in the lab for a titration study at that point    Follow up in March after your next surgery with Dr.  Geronimo. If symptoms do not improve or worsen, please contact office for sooner follow up or seek emergency care.

## 2024-06-10 NOTE — Assessment & Plan Note (Signed)
 Stable without increased O2 requirement. On room air during OV today without desaturations. Goal >88-90%.

## 2024-06-19 DIAGNOSIS — R490 Dysphonia: Secondary | ICD-10-CM | POA: Diagnosis not present

## 2024-06-19 DIAGNOSIS — E785 Hyperlipidemia, unspecified: Secondary | ICD-10-CM | POA: Diagnosis not present

## 2024-06-19 DIAGNOSIS — J449 Chronic obstructive pulmonary disease, unspecified: Secondary | ICD-10-CM | POA: Diagnosis not present

## 2024-06-21 ENCOUNTER — Other Ambulatory Visit: Payer: Self-pay | Admitting: Hematology & Oncology

## 2024-06-26 DIAGNOSIS — R490 Dysphonia: Secondary | ICD-10-CM | POA: Diagnosis not present

## 2024-06-26 DIAGNOSIS — E785 Hyperlipidemia, unspecified: Secondary | ICD-10-CM | POA: Diagnosis not present

## 2024-06-26 DIAGNOSIS — J449 Chronic obstructive pulmonary disease, unspecified: Secondary | ICD-10-CM | POA: Diagnosis not present

## 2024-07-01 ENCOUNTER — Ambulatory Visit (INDEPENDENT_AMBULATORY_CARE_PROVIDER_SITE_OTHER): Admitting: Otolaryngology

## 2024-07-05 ENCOUNTER — Inpatient Hospital Stay

## 2024-07-05 ENCOUNTER — Inpatient Hospital Stay: Attending: Hematology & Oncology

## 2024-07-05 ENCOUNTER — Encounter: Payer: Self-pay | Admitting: Hematology & Oncology

## 2024-07-05 ENCOUNTER — Inpatient Hospital Stay: Admitting: Hematology & Oncology

## 2024-07-05 ENCOUNTER — Other Ambulatory Visit: Payer: Self-pay

## 2024-07-05 VITALS — BP 118/86 | HR 80 | Temp 98.3°F | Resp 16 | Ht 65.0 in | Wt 185.0 lb

## 2024-07-05 DIAGNOSIS — Z87891 Personal history of nicotine dependence: Secondary | ICD-10-CM | POA: Insufficient documentation

## 2024-07-05 DIAGNOSIS — C3432 Malignant neoplasm of lower lobe, left bronchus or lung: Secondary | ICD-10-CM | POA: Diagnosis not present

## 2024-07-05 DIAGNOSIS — Z5112 Encounter for antineoplastic immunotherapy: Secondary | ICD-10-CM | POA: Insufficient documentation

## 2024-07-05 DIAGNOSIS — Z79620 Long term (current) use of immunosuppressive biologic: Secondary | ICD-10-CM | POA: Diagnosis not present

## 2024-07-05 LAB — CBC WITH DIFFERENTIAL (CANCER CENTER ONLY)
Abs Immature Granulocytes: 0.03 K/uL (ref 0.00–0.07)
Basophils Absolute: 0.1 K/uL (ref 0.0–0.1)
Basophils Relative: 1 %
Eosinophils Absolute: 0.3 K/uL (ref 0.0–0.5)
Eosinophils Relative: 4 %
HCT: 45.3 % (ref 39.0–52.0)
Hemoglobin: 15.6 g/dL (ref 13.0–17.0)
Immature Granulocytes: 0 %
Lymphocytes Relative: 12 %
Lymphs Abs: 0.9 K/uL (ref 0.7–4.0)
MCH: 30.1 pg (ref 26.0–34.0)
MCHC: 34.4 g/dL (ref 30.0–36.0)
MCV: 87.3 fL (ref 80.0–100.0)
Monocytes Absolute: 0.7 K/uL (ref 0.1–1.0)
Monocytes Relative: 9 %
Neutro Abs: 5.7 K/uL (ref 1.7–7.7)
Neutrophils Relative %: 74 %
Platelet Count: 220 K/uL (ref 150–400)
RBC: 5.19 MIL/uL (ref 4.22–5.81)
RDW: 13.5 % (ref 11.5–15.5)
WBC Count: 7.7 K/uL (ref 4.0–10.5)
nRBC: 0 % (ref 0.0–0.2)

## 2024-07-05 LAB — CMP (CANCER CENTER ONLY)
ALT: 37 U/L (ref 0–44)
AST: 23 U/L (ref 15–41)
Albumin: 4.2 g/dL (ref 3.5–5.0)
Alkaline Phosphatase: 58 U/L (ref 38–126)
Anion gap: 13 (ref 5–15)
BUN: 12 mg/dL (ref 6–20)
CO2: 24 mmol/L (ref 22–32)
Calcium: 9.1 mg/dL (ref 8.9–10.3)
Chloride: 102 mmol/L (ref 98–111)
Creatinine: 0.9 mg/dL (ref 0.61–1.24)
GFR, Estimated: >60 mL/min (ref >60)
Glucose, Bld: 112 mg/dL — ABNORMAL HIGH (ref 70–99)
Potassium: 4 mmol/L (ref 3.5–5.1)
Sodium: 139 mmol/L (ref 135–145)
Total Bilirubin: 0.4 mg/dL (ref 0.0–1.2)
Total Protein: 7 g/dL (ref 6.5–8.1)

## 2024-07-05 LAB — LACTATE DEHYDROGENASE: LDH: 170 U/L (ref 105–235)

## 2024-07-05 LAB — TSH: TSH: 2.11 u[IU]/mL (ref 0.350–4.500)

## 2024-07-05 MED ORDER — SODIUM CHLORIDE 0.9 % IV SOLN: Freq: Once | INTRAVENOUS | Status: AC

## 2024-07-05 MED ORDER — SODIUM CHLORIDE 0.9 % IV SOLN
1500.0000 mg | Freq: Once | INTRAVENOUS | Status: AC
  Administered 2024-07-05: 1500 mg via INTRAVENOUS
  Filled 2024-07-05: qty 30

## 2024-07-05 NOTE — Progress Notes (Signed)
 [facility] Follow Up Visit  Seyed Heffley [id number] Sep 25, 1965 58 y.o. 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 twice daily-start on 12/06/2022 -changed to 2.5 mg p.o. twice daily on 05/12/2023 Status post radiosurgery-completed on 02/11/2023 Durvalumab  1500 mg IV Q6 weeks-s/p cycle #15-- start on 02/17/2023     Interim History:  Mr. Hardgrove is back for follow-up.  Overall, he is doing quite well.  He really has had no complaints is very last saw him.  He did have a PET scan that was done.  This was done on 06/04/2024.  The PET scan did not show any evidence of metabolically active disease.  He apparently had some polyps on his vocal cord.  He had 1 vocal cord taken care of.  The other 1 will be taken care of in a couple months.  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  He continues on Eliquis .  He is doing well on the Eliquis .  He is on 2.5 mg p.o. twice daily.  He has had no problems working.  He has had no fatigue.  He has had no change in bowel or bladder habits.  He has had no leg swelling.  He has had no bleeding.  He has had no nausea or vomiting.  There has been no headache.  His last TSH back in October was 1.5.  Overall, I will say that his performance status is probably ECOG 1.     Wt Readings from Last 3 Encounters:  07/05/24 185 lb (83.9 kg)  06/10/24 187 lb 3.2 oz (84.9 kg)  05/24/24 180 lb (81.6 kg)     Medications:  Current Outpatient Medications:    albuterol  (PROVENTIL ) (2.5 MG/3ML) 0.083% nebulizer solution, Take 3 mLs (2.5 mg total) by nebulization every 6 (six) hours as needed for wheezing or shortness of breath., Disp: 75 mL, Rfl: 5   albuterol  (VENTOLIN  HFA) 108 (90 Base) MCG/ACT inhaler, Inhale 2 puffs into the  lungs every 6 (six) hours as needed for wheezing., Disp: 2 each, Rfl: 11   amLODipine  (NORVASC ) 5 MG tablet, Take 1 tablet (5 mg total) by mouth daily., Disp: 90 tablet, Rfl: 3   apixaban  (ELIQUIS ) 2.5 MG TABS tablet, Take 1 tablet (2.5 mg total) by mouth 2 (two) times daily., Disp: 180 tablet, Rfl: 3   aspirin  EC 81 MG tablet, Take 1 tablet (81 mg total) by mouth daily. Swallow whole., Disp: 90 tablet, Rfl: 3   atorvastatin  (LIPITOR) 80 MG tablet, Take 1 tablet (80 mg total) by mouth daily., Disp: 90 tablet, Rfl: 3   budesonide -glycopyrrolate-formoterol  (BREZTRI  AEROSPHERE) 160-9-4.8 MCG/ACT AERO inhaler, Inhale 2 puffs into the lungs in the morning and at bedtime., Disp: 10.7 g, Rfl: 11   Coenzyme Q10 100 MG capsule, Take 100 mg by mouth in the morning., Disp: , Rfl:    Evolocumab  (REPATHA  SURECLICK) 140 MG/ML SOAJ, Inject 140 mg into the skin every 14 (fourteen) days., Disp: 6 mL, Rfl: 1   folic acid  (FOLVITE ) 1 MG tablet, TAKE 1 TABLET BY MOUTH DAILY. START 7 DAYS BEFORE PEMETREXED  CHEMOTHERAPY, CONTINUE UNTIL 21 DAYS AFTER PEMETREXED  COMPLETED, Disp: 30 tablet, Rfl: 8   gabapentin  (NEURONTIN ) 300 MG capsule, TAKE 1 CAPSULE BY MOUTH 3 TIMES A DAY, Disp: 90 capsule, Rfl:  3   GUAIFENESIN 1200 PO, Take 1,200 mg by mouth in the morning and at bedtime., Disp: , Rfl:    icosapent  Ethyl (VASCEPA ) 1 g capsule, Take 2 capsules (2 g total) by mouth 2 (two) times daily., Disp: 120 capsule, Rfl: 5   isosorbide  mononitrate (IMDUR ) 30 MG 24 hr tablet, Take 1 tablet (30 mg total) by mouth 2 (two) times daily., Disp: 180 tablet, Rfl: 3   Multiple Vitamin (MULTIVITAMIN) capsule, Take 1 capsule by mouth at bedtime., Disp: , Rfl:    OHTUVAYRE  3 MG/2.5ML SUSP, Take 1 Tube by nebulization in the morning and at bedtime., Disp: , Rfl:    omeprazole  (PRILOSEC  OTC) 20 MG tablet, Take 1 tablet (20 mg total) by mouth 2 (two) times daily before a meal., Disp: 60 tablet, Rfl: 1   triamcinolone  cream (KENALOG ) 0.1 %, Apply 1  Application topically 2 (two) times daily. (Patient taking differently: Apply 1 Application topically 2 (two) times daily as needed (irritation).), Disp: 45 g, Rfl: 3  Allergies:  No Known Allergies   Past Medical History, Surgical history, Social history, and Family History were reviewed and updated.  Review of Systems: Review of Systems  Constitutional: Negative.   HENT:  Negative.    Eyes: Negative.   Respiratory:  Negative for cough.   Cardiovascular: Negative.   Gastrointestinal: Negative.   Endocrine: Negative.   Genitourinary: Negative.    Musculoskeletal: Negative.   Skin: Negative.   Neurological: Negative.   Hematological: Negative.   Psychiatric/Behavioral: Negative.      Physical Exam: Vitals:   07/05/24 1013  BP: 118/86  Pulse: 80  Resp: 16  Temp: 98.3 F (36.8 C)  SpO2: 97%   Wt Readings from Last 3 Encounters:  07/05/24 185 lb (83.9 kg)  06/10/24 187 lb 3.2 oz (84.9 kg)  05/24/24 180 lb (81.6 kg)    Physical Exam Vitals reviewed.  HENT:     Head: Normocephalic and atraumatic.  Eyes:     Pupils: Pupils are equal, round, and reactive to light.  Cardiovascular:     Rate and Rhythm: Normal rate and regular rhythm.     Heart sounds: Normal heart sounds.  Pulmonary:     Effort: Pulmonary effort is normal.     Breath sounds: Normal breath sounds.  Abdominal:     General: Bowel sounds are normal.     Palpations: Abdomen is soft.  Musculoskeletal:        General: No tenderness or deformity. Normal range of motion.     Cervical back: Normal range of motion.  Lymphadenopathy:     Cervical: No cervical adenopathy.  Skin:    General: Skin is warm and dry.     Findings: No erythema or rash.  Neurological:     Mental Status: He is alert and oriented to person, place, and time.  Psychiatric:        Behavior: Behavior normal.        Thought Content: Thought content normal.        Judgment: Judgment normal.     Lab Results  Component Value Date    WBC 7.7 07/05/2024   HGB 15.6 07/05/2024   HCT 45.3 07/05/2024   MCV 87.3 07/05/2024   PLT 220 07/05/2024     Chemistry      Component Value Date/Time   NA 137 05/24/2024 1005   NA 139 12/09/2023 0830   K 3.9 05/24/2024 1005   CL 101 05/24/2024 1005   CO2 25 05/24/2024  1005   BUN 12 05/24/2024 1005   BUN 12 12/09/2023 0830   CREATININE 0.87 05/24/2024 1005   CREATININE 1.05 01/11/2022 0000      Component Value Date/Time   CALCIUM  8.8 (L) 05/24/2024 1005   ALKPHOS 63 05/24/2024 1005   AST 20 05/24/2024 1005   ALT 39 05/24/2024 1005   BILITOT 0.5 05/24/2024 1005      Impression and Plan: Mr. Collington is a very nice 58 year old white male.  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  Again, he does not wish to have surgery.    We have him on maintenance therapy with Durvalumab .  I think we now move him out every 2 months for treatment.  He has done quite well.  I am very happy for him.  He has a good quality of life.  I do not see a problem with him having the other vocal cord taken care of.  We will plan to get him back in 2 months.    Maude JONELLE Crease, MD 12/8/202510:32 AM

## 2024-07-05 NOTE — Patient Instructions (Signed)

## 2024-07-10 ENCOUNTER — Other Ambulatory Visit: Payer: Self-pay | Admitting: Hematology & Oncology

## 2024-07-10 DIAGNOSIS — C3432 Malignant neoplasm of lower lobe, left bronchus or lung: Secondary | ICD-10-CM

## 2024-08-06 ENCOUNTER — Encounter (INDEPENDENT_AMBULATORY_CARE_PROVIDER_SITE_OTHER): Payer: Self-pay

## 2024-08-06 ENCOUNTER — Ambulatory Visit (INDEPENDENT_AMBULATORY_CARE_PROVIDER_SITE_OTHER)

## 2024-08-06 VITALS — BP 126/84 | HR 88 | Wt 180.0 lb

## 2024-08-06 DIAGNOSIS — J383 Other diseases of vocal cords: Secondary | ICD-10-CM

## 2024-08-06 DIAGNOSIS — R490 Dysphonia: Secondary | ICD-10-CM | POA: Diagnosis not present

## 2024-08-06 DIAGNOSIS — Z87891 Personal history of nicotine dependence: Secondary | ICD-10-CM | POA: Diagnosis not present
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
## 2024-08-09 ENCOUNTER — Ambulatory Visit (INDEPENDENT_AMBULATORY_CARE_PROVIDER_SITE_OTHER): Admitting: Medical-Surgical

## 2024-08-09 ENCOUNTER — Encounter: Payer: Self-pay | Admitting: Medical-Surgical

## 2024-08-09 VITALS — BP 107/75 | HR 88 | Temp 98.9°F | Resp 18 | Ht 65.5 in | Wt 190.1 lb

## 2024-08-09 DIAGNOSIS — L309 Dermatitis, unspecified: Secondary | ICD-10-CM

## 2024-08-09 DIAGNOSIS — E782 Mixed hyperlipidemia: Secondary | ICD-10-CM | POA: Diagnosis not present

## 2024-08-09 DIAGNOSIS — I10 Essential (primary) hypertension: Secondary | ICD-10-CM

## 2024-08-09 MED ORDER — CLOBETASOL PROPIONATE 0.05 % EX CREA: 1.0000 | TOPICAL_CREAM | Freq: Two times a day (BID) | CUTANEOUS | 0 refills | Status: AC

## 2024-08-09 NOTE — Progress Notes (Signed)
" ° °       Established patient visit   History of Present Illness   Discussed the use of AI scribe software for clinical note transcription with the patient, who gave verbal consent to proceed.  History of Present Illness   Brandon Phillips. Brandon Phillips is a 59 year old male who presents with eczema on his hands.  Hand eczema - Painful eczema localized to the hands, primarily affecting the palms - Splitting and bleeding of the palms - Onset several months ago - Symptoms worsen with manual work and exposure to hot water - Nightly use of Aquaphor with gloves provides insufficient relief - Trial of triamcinolone  cream without sustained benefit - No frequent wetness of the hands  Immunotherapy exposure - Receiving immunotherapy every eight weeks - Temporal association between immunotherapy initiation and onset of hand eczema  Medication management - Takes amlodipine  and atorvastatin  - Requires refills for both medications     Physical Exam   Physical Exam Vitals and nursing note reviewed.  Constitutional:      General: He is not in acute distress.    Appearance: Normal appearance. He is not ill-appearing.  HENT:     Head: Normocephalic and atraumatic.  Cardiovascular:     Rate and Rhythm: Normal rate and regular rhythm.  Pulmonary:     Effort: Pulmonary effort is normal. No respiratory distress.  Skin:    General: Skin is warm and dry.     Findings: Rash (Extremely dry, peeling areas to bilateral palms with cracking noted on the right palm) present.  Neurological:     Mental Status: He is alert and oriented to person, place, and time.  Psychiatric:        Mood and Affect: Mood normal.        Behavior: Behavior normal.        Thought Content: Thought content normal.        Judgment: Judgment normal.    Assessment & Plan     Hand eczema Chronic hand eczema with cracking, bleeding, and pain. Current treatment insufficient. - Prescribed clobetasol  cream twice daily for  14 days, then two-week break. - Continue Aquaphor with gloves at night. - Advised to avoid hot water and reduce shower temperature. - Monitor response; consider lifileucel if no improvement after 7 days.  Hypertension Managed with amlodipine .  Blood pressure at goal today at 107/75 - Amlodipine  refills already on file  Hyperlipidemia On atorvastatin , well-tolerated. - Atorvastatin  refills already on file.  Follow up   Return if symptoms worsen or fail to improve. __________________________________ Zada FREDRIK Palin, DNP, APRN, FNP-BC Primary Care and Sports Medicine [facility] [place] "

## 2024-08-10 NOTE — Progress Notes (Unsigned)
 "   Cardiology Clinic Note   Patient Name: Brandon Phillips. Date of Encounter: 08/10/2024  Primary Care Provider:  Willo Mini, NP Primary Cardiologist:  Jerel Balding, MD  Patient Profile    Brandon Phillips. 59 year old male presents to the clinic today for follow-up evaluation of his coronary artery disease.  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 vasospasm   Eczema    HLD (hyperlipidemia)    PE (pulmonary thromboembolism) (HCC) 12/04/2022   Primary cancer of left lower lobe of lung (HCC) 09/17/2022   Sleep apnea    cannot tolerate mask   Tobacco use disorder 01/24/2017   Past Surgical History:  Procedure Laterality Date   BRONCHIAL BIOPSY  08/29/2022   Procedure: BRONCHIAL BIOPSIES;  Surgeon: Gladis Leonor HERO, MD;  Location: [facility] ENDOSCOPY;  Service: Pulmonary;;   BRONCHIAL BRUSHINGS  08/29/2022   Procedure: BRONCHIAL BRUSHINGS;  Surgeon: Gladis Leonor HERO, MD;  Location: [facility] ENDOSCOPY;  Service: Pulmonary;;   BRONCHIAL NEEDLE ASPIRATION BIOPSY  08/29/2022   Procedure: BRONCHIAL NEEDLE ASPIRATION BIOPSIES;  Surgeon: Gladis Leonor HERO, MD;  Location: [facility] ENDOSCOPY;  Service: Pulmonary;;   BRONCHIAL WASHINGS  08/29/2022   Procedure: BRONCHIAL WASHINGS;  Surgeon: Gladis Leonor HERO, MD;  Location: [facility] ENDOSCOPY;  Service: Pulmonary;;   CORONARY PRESSURE/FFR WITH 3D MAPPING N/A 10/13/2023   Procedure: Coronary Pressure/FFR w/3D Mapping;  Surgeon: Ladona Heinz, MD;  Location: [facility] INVASIVE CV LAB;  Service: Cardiovascular;  Laterality: N/A;   FIDUCIAL MARKER PLACEMENT  08/29/2022   Procedure: FIDUCIAL MARKER PLACEMENT;  Surgeon: Gladis Leonor HERO, MD;  Location: [facility] ENDOSCOPY;  Service: Pulmonary;;   HERNIA REPAIR     as an infant   IR IMAGING GUIDED PORT INSERTION  10/03/2022   IR  RADIOLOGIST EVAL & MGMT  10/21/2022   LEFT HEART CATH AND CORONARY ANGIOGRAPHY N/A 10/13/2023   Procedure: LEFT HEART CATH AND CORONARY ANGIOGRAPHY;  Surgeon: Ladona Heinz, MD;  Location: [facility] INVASIVE CV LAB;  Service: Cardiovascular;  Laterality: N/A;   MICROLARYNGOSCOPY WITH LASER Left 05/17/2024   Procedure: DIRECT MICROLARYNGOSCOPY, BRONCHOSCOPY WITH PROCEDURE USING CO2 LASER AND EXCISION OF LEFT VOCAL FOLD LESION;  Surgeon: Anice Riis, DO;  Location: [facility] OR;  Service: ENT;  Laterality: Left;   VIDEO BRONCHOSCOPY WITH ENDOBRONCHIAL ULTRASOUND  08/29/2022   Procedure: VIDEO BRONCHOSCOPY WITH ENDOBRONCHIAL ULTRASOUND;  Surgeon: Gladis Leonor HERO, MD;  Location: [facility] ENDOSCOPY;  Service: Pulmonary;;    Allergies  Allergies[1]  History of Present Illness    Brandon Phillips. 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  He was seen and evaluated in the emergency department 3/25.  He reported intermittent chest and jaw pain x 1 week.  His initial EKG showed no acute changes.  His cardiac troponins were negative x 2.  His echocardiogram showed an LVEF of 65 to 70%, G1 DD, normal RV systolic function and no significant valvular abnormalities.  He was noted to have transient ST elevation on telemetry during episodes of jaw/chest pain.  He was started on amlodipine  and Imdur .  He was also noted to have 1 episode of NSVT on his home Kardia mobile device.  He was  started on metoprolol  which was discontinued to allow for treatment of vasospasm.  He underwent cardiac catheterization.  His cath showed nonobstructive CAD.  His atorvastatin  was increased from 20 up to 40 mg daily.  He was discharged in stable condition on 10/14/2023.  He was seen in follow-up by Rosaline Pavy [name] on 04/13/2024.  During that time he presented with his wife.  He noted that he had jaw pain at work in  August.  He did not EKG on his phone which showed significant artifact.  He reported that the increase in Imdur  helped his symptoms.  He had planned appointment with pharmacy for his elevated triglycerides.  He was getting in 6000 steps daily and riding his bicycle to work.  He was using portable oxygen  3 times per week during the day and at night.  He had been started on steroids and an antibiotic for COPD exacerbation that morning.  Follow-up in 4 months was planned.  Nonobstructive CAD, coronary artery vasospasm-no chest pain today.  Previously noted to have episodes of jaw/chest pain in the setting of nonobstructive CAD and was felt to be coronary vasospasm.  He was placed on amiodarone and Imdur . Heart healthy low-sodium diet Maintain physical activity Continue aspirin , amlodipine , Imdur , atorvastatin   Hyperlipidemia-LDL***.  Triglycerides noted to be 222 Continue atorvastatin  Following with pharmacy lipid clinic   NSVT-heart rate today***.  Continues to use home Kardia mobile device.  Denies recent episodes.  Beta-blocker therapy discontinued previously to allow for treatment of vasospasm. Avoid triggers caffeine, chocolate, EtOH, dehydration Continue to monitor  History of PE-reports compliance with apixaban .  Denies bleeding issues. Continue apixaban  Follows with PCP  History of lung cancer-he is status post chemo/radiation.  He continues with immunotherapy***. Following with oncology  Disposition: Follow-up with Dr. [name] or me in 6 months.  Home Medications    Prior to Admission medications  Medication Sig Start Date End Date Taking? Authorizing Provider  albuterol  (PROVENTIL ) (2.5 MG/3ML) 0.083% nebulizer solution Take 3 mLs (2.5 mg total) by nebulization every 6 (six) hours as needed for wheezing or shortness of breath. 11/19/22   Cobb, Comer GAILS, NP  albuterol  (VENTOLIN  HFA) 108 (90 Base) MCG/ACT inhaler Inhale 2 puffs into the lungs every 6 (six) hours as needed for  wheezing. 01/04/22   Willo Mini, NP  amLODipine  (NORVASC ) 5 MG tablet Take 1 tablet (5 mg total) by mouth daily. 04/13/24 08/09/24  Pavy Olivia HERO, [name]  apixaban  (ELIQUIS ) 2.5 MG TABS tablet Take 1 tablet (2.5 mg total) by mouth 2 (two) times daily. 10/24/23   Daneen Damien BROCKS, NP  aspirin  EC 81 MG tablet Take 1 tablet (81 mg total) by mouth daily. Swallow whole. 10/24/23   Daneen Damien BROCKS, NP  atorvastatin  (LIPITOR) 80 MG tablet Take 1 tablet (80 mg total) by mouth daily. 04/13/24 08/09/24  Pavy Olivia HERO, [name]  budesonide -glycopyrrolate-formoterol  (BREZTRI  AEROSPHERE) 160-9-4.8 MCG/ACT AERO inhaler Inhale 2 puffs into the lungs in the morning and at bedtime. 06/10/24   Cobb, Comer GAILS, NP  clobetasol  cream (TEMOVATE ) 0.05 % Apply 1 Application topically 2 (two) times daily. Use for up to 14 days. 08/09/24   Willo Mini, NP  Coenzyme Q10 100 MG capsule Take 100 mg by mouth in the morning.    [provider]  Evolocumab  (REPATHA  SURECLICK) 140 MG/ML SOAJ Inject 140 mg into the skin every 14 (fourteen) days. 06/01/24   Croitoru, Mihai, MD  folic acid  (FOLVITE ) 1 MG tablet TAKE ONE TABLET BY MOUTH DAILY. START  SEVEN DAYS BEFORE PEMETREXED  CHEMOTHERAPY, CONTINUE UNTIL TWENTY-ONE DAYS AFTER PEMETREXED  COMPLETED. 07/10/24   Timmy Maude SAUNDERS, MD  gabapentin  (NEURONTIN ) 300 MG capsule TAKE 1 CAPSULE BY MOUTH 3 TIMES A DAY 06/21/24   [name], Peter R, MD  GUAIFENESIN 1200 PO Take 1,200 mg by mouth in the morning and at bedtime.    [provider]  icosapent  Ethyl (VASCEPA ) 1 g capsule Take 2 capsules (2 g total) by mouth 2 (two) times daily. 06/01/24   Croitoru, Mihai, MD  isosorbide  mononitrate (IMDUR ) 30 MG 24 hr tablet Take 1 tablet (30 mg total) by mouth 2 (two) times daily. 04/13/24   Parthenia Olivia HERO, [name]  Multiple Vitamin (MULTIVITAMIN) capsule Take 1 capsule by mouth at bedtime.    [provider]  OHTUVAYRE  3 MG/2.5ML SUSP Take 1 Tube by nebulization in the morning and at  bedtime. Patient not taking: Reported on 08/09/2024 04/14/24   [provider]  omeprazole  (PRILOSEC  OTC) 20 MG tablet Take 1 tablet (20 mg total) by mouth 2 (two) times daily before a meal. 05/17/24 05/17/25  Anice Riis, DO  triamcinolone  cream (KENALOG ) 0.1 % Apply 1 Application topically 2 (two) times daily. Patient taking differently: Apply 1 Application topically 2 (two) times daily as needed (irritation). 10/16/23   Willo Mini, NP    Family History    No family history on file. has no family status information on file.   Social History    Social History   Socioeconomic History   Marital status: Married    Spouse name: Randine   Number of children: Not on file   Years of education: Not on file   Highest education level: Associate degree: occupational, scientist, product/process development, or vocational program  Occupational History   Not on file  Tobacco Use   Smoking status: Former    Current packs/day: 0.00    Average packs/day: 1.5 packs/day for 35.0 years (52.5 ttl pk-yrs)    Types: Cigarettes    Start date: 03/30/1982    Quit date: 03/30/2017    Years since quitting: 7.3   Smokeless tobacco: Never   Tobacco comments:    Quit smoking cigarettes in 2018.  Quit vaping in 07/2021  Vaping Use   Vaping status: Former   Quit date: 08/23/2022   Substances: Nicotine  Substance and Sexual Activity   Alcohol use: No   Drug use: No   Sexual activity: Yes  Other Topics Concern   Not on file  Social History Narrative   5 golden retrievers   Social Drivers of Health   Tobacco Use: Medium Risk (08/09/2024)   Patient History    Smoking Tobacco Use: Former    Smokeless Tobacco Use: Never    Passive Exposure: Not on file  Financial Resource Strain: Low Risk (08/08/2024)   Overall Financial Resource Strain (CARDIA)    Difficulty of Paying Living Expenses: Not very hard  Food Insecurity: No Food Insecurity (08/08/2024)   Epic    Worried About Programme Researcher, Broadcasting/film/video in the Last Year: Never true     Ran Out of Food in the Last Year: Never true  Transportation Needs: No Transportation Needs (08/08/2024)   Epic    Lack of Transportation (Medical): No    Lack of Transportation (Non-Medical): No  Physical Activity: Insufficiently Active (08/08/2024)   Exercise Vital Sign    Days of Exercise per Week: 4 days    Minutes of Exercise per Session: 30 min  Stress: No Stress Concern Present (08/08/2024)  [employer] of Occupational Health - Occupational Stress Questionnaire    Feeling of Stress: Only a little  Social Connections: Moderately Isolated (08/08/2024)   Social Connection and Isolation Panel    Frequency of Communication with Friends and Family: More than three times a week    Frequency of Social Gatherings with Friends and Family: Once a week    Attends Religious Services: Never    Database Administrator or Organizations: No    Attends Engineer, Structural: Not on file    Marital Status: Married  Catering Manager Violence: Not At Risk (10/15/2023)   Humiliation, Afraid, Rape, and Kick questionnaire    Fear of Current or Ex-Partner: No    Emotionally Abused: No    Physically Abused: No    Sexually Abused: No  Depression (PHQ2-9): Low Risk (08/09/2024)   Depression (PHQ2-9)    PHQ-2 Score: 0  Alcohol Screen: Low Risk (08/08/2024)   Alcohol Screen    Last Alcohol Screening Score (AUDIT): 1  Housing: Low Risk (08/08/2024)   Epic    Unable to Pay for Housing in the Last Year: No    Number of Times Moved in the Last Year: 0    Homeless in the Last Year: No  Utilities: Not At Risk (10/15/2023)   AHC Utilities    Threatened with loss of utilities: No  Health Literacy: Not on file     Review of Systems    General:  No chills, fever, night sweats or weight changes.  Cardiovascular:  No chest pain, dyspnea on exertion, edema, orthopnea, palpitations, paroxysmal nocturnal dyspnea. Dermatological: No rash, lesions/masses Respiratory: No cough, dyspnea Urologic: No  hematuria, dysuria Abdominal:   No nausea, vomiting, diarrhea, bright red blood per rectum, melena, or hematemesis Neurologic:  No visual changes, wkns, changes in mental status. All other systems reviewed and are otherwise negative except as noted above.  Physical Exam    VS:  There were no vitals taken for this visit. , BMI There is no height or weight on file to calculate BMI. GEN: Well nourished, well developed, in no acute distress. HEENT: normal. Neck: Supple, no JVD, carotid bruits, or masses. Cardiac: RRR, no murmurs, rubs, or gallops. No clubbing, cyanosis, edema.  Radials/DP/PT 2+ and equal bilaterally.  Respiratory:  Respirations regular and unlabored, clear to auscultation bilaterally. GI: Soft, nontender, nondistended, BS + x 4. MS: no deformity or atrophy. Skin: warm and dry, no rash. Neuro:  Strength and sensation are intact. Psych: Normal affect.  Accessory Clinical Findings    Recent Labs: 10/10/2023: Magnesium  1.8 07/05/2024: ALT 37; BUN 12; Creatinine 0.90; Hemoglobin 15.6; Platelet Count 220; Potassium 4.0; Sodium 139; TSH 2.110   Recent Lipid Panel    Component Value Date/Time   CHOL 181 03/16/2024 0832   TRIG 222 (H) 03/16/2024 0832   HDL 39 (L) 03/16/2024 0832   CHOLHDL 4.6 03/16/2024 0832   CHOLHDL 5.1 10/13/2023 0430   VLDL 79 (H) 10/13/2023 0430   LDLCALC 104 (H) 03/16/2024 0832   LDLCALC 88 01/11/2022 0000         ECG personally reviewed by me today- ***     Left Heart Catheterization 10/13/23: Hemodynamic data: LVEDP 12 mmHg.  No pressure gradient across the aortic valve.   Angiographic data: There is diffuse coronary calcification.  Left dominant circulation. LM large-caliber vessel, bifurcates into LAD and CX. LCx: Dominant vessel and a very large caliber vessel giving origin to large OM1, small OM 2, OM 3, large  PDA and PL branch distally.  Proximal circumflex has mild 30% stenosis, large OM1 with proximal 30% stenosis. LAD: It is  moderately calcified diffusely.  There is diffuse luminal irregularity.  Gives origin to large D1 with a ostial 50 to 60% stenosis.  After the D1 origin, mid LAD has calcific 50% stenosis, mild disease in the mid to distal segment followed by another 50% stenosis in the midsegment.  LAD ends at the apex, D1 and D3 being large vessels with mild disease. RCA: Nondominant, proximal to mid 40% stenosis.   Virtual FFR using CathWorx: Proximal LAD 0.84, distal LAD 0.82, lesions are not hemodynamically significant.        Echocardiogram 10/11/2023  IMPRESSIONS     1. Left ventricular ejection fraction, by estimation, is 65 to 70%. The  left ventricle has normal function. The left ventricle has no regional  wall motion abnormalities. Left ventricular diastolic parameters are  consistent with Grade I diastolic  dysfunction (impaired relaxation).   2. Right ventricular systolic function is normal. The right ventricular  size is normal. Tricuspid regurgitation signal is inadequate for assessing  PA pressure.   3. No evidence of mitral valve regurgitation.   4. The aortic valve was not well visualized. Aortic valve regurgitation  is not visualized.   5. The inferior vena cava is normal in size with greater than 50%  respiratory variability, suggesting right atrial pressure of 3 mmHg.   FINDINGS   Left Ventricle: Left ventricular ejection fraction, by estimation, is 65  to 70%. The left ventricle has normal function. The left ventricle has no  regional wall motion abnormalities. The left ventricular internal cavity  size was normal in size. There is   no left ventricular hypertrophy. Left ventricular diastolic parameters  are consistent with Grade I diastolic dysfunction (impaired relaxation).   Right Ventricle: The right ventricular size is normal. Right ventricular  systolic function is normal. Tricuspid regurgitation signal is inadequate  for assessing PA pressure.   Left Atrium: Left  atrial size was normal in size.   Right Atrium: Right atrial size was normal in size.   Pericardium: There is no evidence of pericardial effusion.   Mitral Valve: No evidence of mitral valve regurgitation.   Tricuspid Valve: Tricuspid valve regurgitation is not demonstrated.   Aortic Valve: The aortic valve was not well visualized. Aortic valve  regurgitation is not visualized. Aortic valve mean gradient measures 11.0  mmHg. Aortic valve peak gradient measures 21.3 mmHg. Aortic valve area, by  VTI measures 2.67 cm.   Pulmonic Valve: The pulmonic valve was not well visualized. Pulmonic valve  regurgitation is not visualized.   Aorta: The aortic root and ascending aorta are structurally normal, with  no evidence of dilitation.   Venous: The inferior vena cava is normal in size with greater than 50%  respiratory variability, suggesting right atrial pressure of 3 mmHg.   IAS/Shunts: The interatrial septum was not well visualized.    Assessment & Plan   1.  ***   Josefa HERO. Maxamillian Tienda NP-C     08/10/2024, 7:58 AM [facility] [address] [place], [place] [address] Office [phone]    Notice: This dictation was prepared with Dragon dictation along with smaller phrase technology. Any transcriptional errors that result from this process are unintentional and may not be corrected upon review.   I spent***minutes examining this patient, reviewing medications, and using patient centered shared decision making involving their cardiac care.  I spent  20 minutes reviewing past medical history,  medications, and prior cardiac tests.     [1] No Known Allergies  "

## 2024-08-11 ENCOUNTER — Encounter: Payer: Self-pay | Admitting: General Practice

## 2024-08-11 ENCOUNTER — Ambulatory Visit: Attending: Cardiology | Admitting: General Practice

## 2024-08-11 VITALS — BP 124/78 | HR 95 | Ht 65.5 in | Wt 190.6 lb

## 2024-08-11 DIAGNOSIS — I2510 Atherosclerotic heart disease of native coronary artery without angina pectoris: Secondary | ICD-10-CM

## 2024-08-11 DIAGNOSIS — Z86711 Personal history of pulmonary embolism: Secondary | ICD-10-CM | POA: Diagnosis not present

## 2024-08-11 DIAGNOSIS — E782 Mixed hyperlipidemia: Secondary | ICD-10-CM | POA: Diagnosis not present

## 2024-08-11 DIAGNOSIS — I25111 Atherosclerotic heart disease of native coronary artery with angina pectoris with documented spasm: Secondary | ICD-10-CM

## 2024-08-11 DIAGNOSIS — C3432 Malignant neoplasm of lower lobe, left bronchus or lung: Secondary | ICD-10-CM

## 2024-08-11 DIAGNOSIS — I4729 Other ventricular tachycardia: Secondary | ICD-10-CM | POA: Diagnosis not present

## 2024-08-11 DIAGNOSIS — I201 Angina pectoris with documented spasm: Secondary | ICD-10-CM

## 2024-08-11 MED ORDER — REPATHA SURECLICK 140 MG/ML ~~LOC~~ SOAJ: 1.0000 mL | SUBCUTANEOUS | 1 refills | Status: AC

## 2024-08-11 MED ORDER — ISOSORBIDE MONONITRATE ER 30 MG PO TB24: 30.0000 mg | ORAL_TABLET | Freq: Two times a day (BID) | ORAL | 3 refills | Status: AC

## 2024-08-11 MED ORDER — ICOSAPENT ETHYL 1 G PO CAPS: 2.0000 g | ORAL_CAPSULE | Freq: Two times a day (BID) | ORAL | 5 refills | Status: AC

## 2024-08-11 NOTE — Patient Instructions (Signed)
 Medication Instructions:  Your physician recommends that you continue on your current medications as directed. Please refer to the Current Medication list given to you today.  *If you need a refill on your cardiac medications before your next appointment, please call your pharmacy*  Lab Work: March- Fasting Lipids, LFTs If you have labs (blood work) drawn today and your tests are completely normal, you will receive your results only by: MyChart Message (if you have MyChart) OR A paper copy in the mail If you have any lab test that is abnormal or we need to change your treatment, we will call you to review the results.   Follow-Up: At [facility], you and your health needs are our priority.  As part of our continuing mission to provide you with exceptional heart care, our providers are all part of one team.  This team includes your primary Cardiologist (physician) and Advanced Practice Providers or APPs (Physician Assistants and Nurse Practitioners) who all work together to provide you with the care you need, when you need it.  Your next appointment:   6 month(s)  Provider:   Jerel Balding, MD

## 2024-08-13 ENCOUNTER — Telehealth (HOSPITAL_BASED_OUTPATIENT_CLINIC_OR_DEPARTMENT_OTHER): Payer: Self-pay

## 2024-08-13 ENCOUNTER — Telehealth (INDEPENDENT_AMBULATORY_CARE_PROVIDER_SITE_OTHER): Payer: Self-pay

## 2024-08-13 NOTE — Telephone Encounter (Signed)
 Pharmacy, can you please provide recommendations for holding Eliquis ?  Thank you!

## 2024-08-13 NOTE — Telephone Encounter (Signed)
 Hi Josefa, you recently saw this patient in clinic earlier this week. Are you able to comment on surgical clearance for upcoming procedure (MSDL W/ Lesion Excision W/ Laser)? Please route your response to P CV DIV PREOP.   Thanks so much! Tarik Teixeira

## 2024-08-13 NOTE — Telephone Encounter (Signed)
 I faxed medical clearances for patient to have surgery to [facility]/Dr Croitoru. They went through successfully.

## 2024-08-13 NOTE — Telephone Encounter (Signed)
"  ° °  Pre-operative Risk Assessment    Patient Name: Brandon Phillips.  DOB: [birth year] MRN: [id number]   Date of last office visit: 08/11/24 with Emelia Date of next office visit: NA  Request for Surgical Clearance    Procedure:  MSDL W/ Lesion Excision W/ Laser  Date of Surgery:  Clearance TBD                                 Surgeon:  Dr. Anice Socks Group or Practice Name:  [facility] Phone number:  [phone] Fax number:  [phone]   Type of Clearance Requested:   - Medical  - Pharmacy:  Hold Aspirin  and Apixaban  (Eliquis ) not indicated    Type of Anesthesia:  General    Additional requests/questions:    Bonney Augustin JONETTA Delores   08/13/2024, 1:39 PM   "

## 2024-08-17 ENCOUNTER — Telehealth: Payer: Self-pay | Admitting: Internal Medicine

## 2024-08-17 NOTE — Telephone Encounter (Signed)
 Fax received from Dr. Anice with [facility] to perform a MSDL w lesion excision with laser on patient.  Patient needs surgery clearance. Surgery is TBD. Patient was seen on 06/10/24. Office protocol is a risk assessment can be sent to surgeon if patient has been seen in 60 days or less.   Pt is currently scheduled with Dr. Geronimo for 10/07/24- will keep this appt for now and call sooner if wishes to move up the appt. Routing to clearance pool for now.

## 2024-08-17 NOTE — Telephone Encounter (Signed)
"  ° °  Patient Name: Brandon Phillips.  DOB: [birth year] MRN: [id number]  Primary Cardiologist: Jerel Balding, MD  Chart reviewed as part of pre-operative protocol coverage. Given past medical history and time since last visit, based on ACC/AHA guidelines, Brandon Tejada. is at acceptable risk for the planned procedure without further cardiovascular testing.   Per Josefa Beauvais, NP: Yes, he may proceed with planned surgery without further cardiac testing.   He may hold aspirin  for 5-7 days prior to procedure. Please resume aspirin  as soon as possible postprocedure, at the discretion of the surgeon.    Per office protocol, patient can hold Eliquis  for 2 days prior to procedure.    I will route this recommendation to the requesting party via Epic fax function and remove from pre-op pool.  Please call with questions.  Lum LITTIE Louis, NP 08/17/2024, 12:41 PM  "

## 2024-08-17 NOTE — Telephone Encounter (Signed)
 Patient with diagnosis of PE on Eliquis  for anticoagulation.    Procedure: MSDL W/ Lesion Excision W/ Laser  Date of procedure: TBD  CrCl 91 ml/min Platelet count 220  Per office protocol, patient can hold Eliquis  for 2 days prior to procedure.    **This guidance is not considered finalized until pre-operative APP has relayed final recommendations.**

## 2024-08-19 ENCOUNTER — Telehealth: Payer: Self-pay | Admitting: Radiation Oncology

## 2024-08-19 ENCOUNTER — Telehealth (INDEPENDENT_AMBULATORY_CARE_PROVIDER_SITE_OTHER): Payer: Self-pay
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
## 2024-08-19 NOTE — Telephone Encounter (Signed)
 Spoke to pt's spouse to schedule consult with Dr. Izell. At this time, agreeable to 1/23@10 :30am.

## 2024-08-19 NOTE — Telephone Encounter (Signed)
 I called and spoke with patients wife. I was calling to inform of medication hold for surgery w/date to be determined. She informed me that Dr. Anice wanted patient to see Oncology first. She stated they had not called. I gave her the phone # to reach Lauraine Golden.  When [place] does surgery, patient is to hold Asprin for 5-7 days prior procedure and resume ASAP after and to hold Eliquis  2 days before procedure, per Dr. Jerel Croitoru, resuming not noted.

## 2024-08-19 NOTE — Telephone Encounter (Signed)
 1/22 Patient's wife call ready to sch patient.  Unfortunately quick a few referrals are ahead of patient.  Inbasket sent to Delon HERO, RN and copied scheduling team, so they are aware.

## 2024-08-20 ENCOUNTER — Encounter: Payer: Self-pay | Admitting: Radiation Oncology

## 2024-08-20 ENCOUNTER — Ambulatory Visit
Admission: RE | Admit: 2024-08-20 | Discharge: 2024-08-20 | Disposition: A | Source: Ambulatory Visit | Attending: Radiation Oncology | Admitting: Radiation Oncology

## 2024-08-20 ENCOUNTER — Ambulatory Visit
Admission: RE | Admit: 2024-08-20 | Discharge: 2024-08-20 | Disposition: A | Source: Ambulatory Visit | Attending: Radiation Oncology

## 2024-08-20 VITALS — BP 133/95 | HR 99 | Temp 97.3°F | Resp 20 | Ht 65.5 in | Wt 187.6 lb

## 2024-08-20 DIAGNOSIS — Z79899 Other long term (current) drug therapy: Secondary | ICD-10-CM | POA: Insufficient documentation

## 2024-08-20 DIAGNOSIS — Z87891 Personal history of nicotine dependence: Secondary | ICD-10-CM | POA: Insufficient documentation

## 2024-08-20 DIAGNOSIS — Z7982 Long term (current) use of aspirin: Secondary | ICD-10-CM | POA: Diagnosis not present

## 2024-08-20 DIAGNOSIS — Z7901 Long term (current) use of anticoagulants: Secondary | ICD-10-CM | POA: Insufficient documentation

## 2024-08-20 DIAGNOSIS — E785 Hyperlipidemia, unspecified: Secondary | ICD-10-CM | POA: Diagnosis not present

## 2024-08-20 DIAGNOSIS — I2510 Atherosclerotic heart disease of native coronary artery without angina pectoris: Secondary | ICD-10-CM | POA: Diagnosis not present

## 2024-08-20 DIAGNOSIS — G4730 Sleep apnea, unspecified: Secondary | ICD-10-CM | POA: Insufficient documentation

## 2024-08-20 DIAGNOSIS — Z86711 Personal history of pulmonary embolism: Secondary | ICD-10-CM | POA: Diagnosis not present

## 2024-08-20 DIAGNOSIS — R490 Dysphonia: Secondary | ICD-10-CM | POA: Insufficient documentation

## 2024-08-20 DIAGNOSIS — J449 Chronic obstructive pulmonary disease, unspecified: Secondary | ICD-10-CM | POA: Insufficient documentation

## 2024-08-20 DIAGNOSIS — J383 Other diseases of vocal cords: Secondary | ICD-10-CM

## 2024-08-20 DIAGNOSIS — Z8701 Personal history of pneumonia (recurrent): Secondary | ICD-10-CM | POA: Diagnosis not present

## 2024-08-20 DIAGNOSIS — C3432 Malignant neoplasm of lower lobe, left bronchus or lung: Secondary | ICD-10-CM | POA: Insufficient documentation

## 2024-08-20 NOTE — Progress Notes (Addendum)
 Head and Neck Cancer Location of Tumor / Histology:  Bilateral Vocal Fold Leukoplakia/ Squamous Papilloma      Patient presented  months ago with symptoms of:  Patient experiencing some voice hoarseness.  Biopsies revealed:     Nutrition Status Yes No Comments  Weight changes? []  [x]    Swallowing concerns? []  [x]    PEG? []  [x]     Referrals Yes No Comments  Social Work? [x]  []    Dentistry? [x]  []    Swallowing therapy? [x]  []    Nutrition? [x]  []    Med/Onc? [x]  []     Safety Issues Yes No Comments  Prior radiation? [x]  []    Pacemaker/ICD? []  [x]    Possible current pregnancy? []  [x]    Is the patient on methotrexate? []  [x]     Tobacco/Marijuana/Snuff/ETOH use: None  Past/Anticipated interventions by otolaryngology, if any:  Knight, DO 08/06/2024    Past/Anticipated interventions by medical oncology, if any:      Current Complaints / other details:

## 2024-09-06 ENCOUNTER — Inpatient Hospital Stay

## 2024-09-06 ENCOUNTER — Inpatient Hospital Stay: Attending: Hematology & Oncology

## 2024-09-06 ENCOUNTER — Inpatient Hospital Stay: Admitting: Hematology & Oncology

## 2024-10-07 ENCOUNTER — Ambulatory Visit: Admitting: Internal Medicine

## 2024-10-29 ENCOUNTER — Ambulatory Visit (HOSPITAL_COMMUNITY): Admit: 2024-10-29

## 2024-11-12 ENCOUNTER — Ambulatory Visit (INDEPENDENT_AMBULATORY_CARE_PROVIDER_SITE_OTHER)

## 2025-01-21 ENCOUNTER — Encounter: Admitting: Medical-Surgical
